# Patient Record
Sex: Male | Born: 1935 | Race: White | Hispanic: No | Marital: Married | Smoking: Never smoker
Health system: Southern US, Community
[De-identification: ages and names within clinical notes are randomized; demographics above are authoritative.]

## PROBLEM LIST (undated history)

## (undated) DIAGNOSIS — I219 Acute myocardial infarction, unspecified: Secondary | ICD-10-CM

## (undated) DIAGNOSIS — R7303 Prediabetes: Secondary | ICD-10-CM

## (undated) DIAGNOSIS — R972 Elevated prostate specific antigen [PSA]: Secondary | ICD-10-CM

## (undated) DIAGNOSIS — K222 Esophageal obstruction: Secondary | ICD-10-CM

## (undated) DIAGNOSIS — R112 Nausea with vomiting, unspecified: Secondary | ICD-10-CM

## (undated) DIAGNOSIS — I10 Essential (primary) hypertension: Secondary | ICD-10-CM

## (undated) DIAGNOSIS — K5790 Diverticulosis of intestine, part unspecified, without perforation or abscess without bleeding: Secondary | ICD-10-CM

## (undated) DIAGNOSIS — Z9889 Other specified postprocedural states: Secondary | ICD-10-CM

## (undated) DIAGNOSIS — E119 Type 2 diabetes mellitus without complications: Secondary | ICD-10-CM

## (undated) DIAGNOSIS — I4891 Unspecified atrial fibrillation: Secondary | ICD-10-CM

## (undated) DIAGNOSIS — M25519 Pain in unspecified shoulder: Secondary | ICD-10-CM

## (undated) DIAGNOSIS — Z9289 Personal history of other medical treatment: Secondary | ICD-10-CM

## (undated) DIAGNOSIS — L719 Rosacea, unspecified: Secondary | ICD-10-CM

## (undated) DIAGNOSIS — M1990 Unspecified osteoarthritis, unspecified site: Secondary | ICD-10-CM

## (undated) DIAGNOSIS — K219 Gastro-esophageal reflux disease without esophagitis: Secondary | ICD-10-CM

## (undated) DIAGNOSIS — S129XXA Fracture of neck, unspecified, initial encounter: Secondary | ICD-10-CM

## (undated) DIAGNOSIS — I2510 Atherosclerotic heart disease of native coronary artery without angina pectoris: Secondary | ICD-10-CM

## (undated) HISTORY — DX: Other specified postprocedural states: Z98.890

## (undated) HISTORY — PX: HERNIA REPAIR: SHX51

## (undated) HISTORY — DX: Atherosclerotic heart disease of native coronary artery without angina pectoris: I25.10

## (undated) HISTORY — PX: EYE SURGERY: SHX253

## (undated) HISTORY — DX: Personal history of other medical treatment: Z92.89

## (undated) HISTORY — DX: Diverticulosis of intestine, part unspecified, without perforation or abscess without bleeding: K57.90

## (undated) HISTORY — DX: Type 2 diabetes mellitus without complications: E11.9

## (undated) HISTORY — DX: Rosacea, unspecified: L71.9

## (undated) HISTORY — DX: Nausea with vomiting, unspecified: R11.2

## (undated) HISTORY — PX: TOTAL HIP ARTHROPLASTY: SHX124

## (undated) HISTORY — PX: CORNEAL TRANSPLANT: SHX108

## (undated) HISTORY — DX: Elevated prostate specific antigen (PSA): R97.20

## (undated) HISTORY — DX: Pain in unspecified shoulder: M25.519

---

## 1998-08-21 ENCOUNTER — Encounter: Payer: Self-pay | Admitting: Orthopedic Surgery

## 1998-08-25 ENCOUNTER — Inpatient Hospital Stay (HOSPITAL_COMMUNITY): Admission: RE | Admit: 1998-08-25 | Discharge: 1998-08-30 | Payer: Self-pay | Admitting: Orthopedic Surgery

## 1998-08-25 ENCOUNTER — Encounter: Payer: Self-pay | Admitting: Orthopedic Surgery

## 1998-08-28 ENCOUNTER — Encounter: Payer: Self-pay | Admitting: Orthopedic Surgery

## 1998-09-01 ENCOUNTER — Encounter (HOSPITAL_COMMUNITY): Admission: RE | Admit: 1998-09-01 | Discharge: 1998-11-30 | Payer: Self-pay | Admitting: Orthopedic Surgery

## 1999-09-21 ENCOUNTER — Encounter: Payer: Self-pay | Admitting: Internal Medicine

## 1999-09-21 ENCOUNTER — Encounter: Admission: RE | Admit: 1999-09-21 | Discharge: 1999-09-21 | Payer: Self-pay | Admitting: Internal Medicine

## 1999-09-28 ENCOUNTER — Encounter: Payer: Self-pay | Admitting: Gastroenterology

## 1999-09-28 ENCOUNTER — Ambulatory Visit (HOSPITAL_COMMUNITY): Admission: RE | Admit: 1999-09-28 | Discharge: 1999-09-28 | Payer: Self-pay | Admitting: Gastroenterology

## 2000-05-08 ENCOUNTER — Encounter: Payer: Self-pay | Admitting: Interventional Cardiology

## 2000-05-08 ENCOUNTER — Ambulatory Visit (HOSPITAL_COMMUNITY): Admission: RE | Admit: 2000-05-08 | Discharge: 2000-05-08 | Payer: Self-pay | Admitting: Interventional Cardiology

## 2004-10-09 ENCOUNTER — Ambulatory Visit (HOSPITAL_COMMUNITY): Admission: RE | Admit: 2004-10-09 | Discharge: 2004-10-09 | Payer: Self-pay | Admitting: Gastroenterology

## 2004-12-06 ENCOUNTER — Inpatient Hospital Stay (HOSPITAL_COMMUNITY): Admission: EM | Admit: 2004-12-06 | Discharge: 2004-12-07 | Payer: Self-pay | Admitting: Emergency Medicine

## 2004-12-07 ENCOUNTER — Ambulatory Visit: Payer: Self-pay | Admitting: Pulmonary Disease

## 2005-01-21 ENCOUNTER — Encounter (HOSPITAL_COMMUNITY): Admission: RE | Admit: 2005-01-21 | Discharge: 2005-04-21 | Payer: Self-pay | Admitting: Internal Medicine

## 2005-01-29 ENCOUNTER — Ambulatory Visit: Payer: Self-pay | Admitting: Pulmonary Disease

## 2005-02-12 ENCOUNTER — Encounter: Admission: RE | Admit: 2005-02-12 | Discharge: 2005-02-12 | Payer: Self-pay | Admitting: Internal Medicine

## 2005-02-20 ENCOUNTER — Encounter: Admission: RE | Admit: 2005-02-20 | Discharge: 2005-02-20 | Payer: Self-pay | Admitting: Internal Medicine

## 2005-02-20 ENCOUNTER — Encounter (INDEPENDENT_AMBULATORY_CARE_PROVIDER_SITE_OTHER): Payer: Self-pay | Admitting: Specialist

## 2005-02-20 ENCOUNTER — Other Ambulatory Visit: Admission: RE | Admit: 2005-02-20 | Discharge: 2005-02-20 | Payer: Self-pay | Admitting: Interventional Radiology

## 2005-04-19 ENCOUNTER — Ambulatory Visit (HOSPITAL_COMMUNITY): Admission: RE | Admit: 2005-04-19 | Discharge: 2005-04-19 | Payer: Self-pay | Admitting: Pulmonary Disease

## 2005-04-26 ENCOUNTER — Ambulatory Visit: Payer: Self-pay | Admitting: Pulmonary Disease

## 2005-09-02 ENCOUNTER — Encounter: Admission: RE | Admit: 2005-09-02 | Discharge: 2005-09-02 | Payer: Self-pay | Admitting: Internal Medicine

## 2008-10-21 ENCOUNTER — Encounter: Admission: RE | Admit: 2008-10-21 | Discharge: 2008-10-21 | Payer: Self-pay | Admitting: Internal Medicine

## 2010-11-04 ENCOUNTER — Encounter: Payer: Self-pay | Admitting: Internal Medicine

## 2010-11-07 ENCOUNTER — Ambulatory Visit (HOSPITAL_COMMUNITY)
Admission: RE | Admit: 2010-11-07 | Discharge: 2010-11-07 | Payer: Self-pay | Source: Home / Self Care | Attending: Gastroenterology | Admitting: Gastroenterology

## 2010-11-28 ENCOUNTER — Ambulatory Visit (HOSPITAL_COMMUNITY)
Admission: RE | Admit: 2010-11-28 | Discharge: 2010-11-28 | Disposition: A | Discharge status: Home / Self Care | Payer: Medicare Other | Source: Ambulatory Visit | Attending: Gastroenterology | Admitting: Gastroenterology

## 2010-11-28 DIAGNOSIS — R1310 Dysphagia, unspecified: Secondary | ICD-10-CM | POA: Insufficient documentation

## 2010-11-28 DIAGNOSIS — K222 Esophageal obstruction: Secondary | ICD-10-CM | POA: Insufficient documentation

## 2010-11-28 DIAGNOSIS — K449 Diaphragmatic hernia without obstruction or gangrene: Secondary | ICD-10-CM | POA: Insufficient documentation

## 2011-03-01 NOTE — H&P (Signed)
Jonathan [name] NO.:  [id number]   MEDICAL RECORD NO.:  [id number]          PATIENT TYPE:  INP   LOCATION:  1824                         FACILITY:  [facility]   PHYSICIAN:  Ulyses Amor, MD DATE OF BIRTH:  10-27-1930   DATE OF ADMISSION:  12/06/2004  DATE OF DISCHARGE:                                HISTORY & PHYSICAL   HISTORY OF PRESENT ILLNESS:  Jonathan [name] is a 77 year old white male who  is admitted to [facility] for further evaluation of chest pain and  palpitations.   The patient reportedly has a history of single vessel coronary artery  disease.  His records are on microfilm and thus not yet available at this  time.  However, he apparently underwent cardiac catheterization  approximately five years ago.  No intervention was performed.  His cardiac  history has been uncomplicated.  There is no history of chest pain,  myocardial infarction, congestive heart failure, or arrhythmia.   The patient experienced the onset of palpitations while he was in bed which  was described as intermittent of a rapid and irregular heartbeat.  This was  accompanied by an indigestion in his left upper anterior chest.  There was  no radiation of this chest discomfort.  There appeared to be no exacerbating  or ameliorating factors.  It was unrelated to position, activity, meals or  respiration.  There was no dyspnea, diaphoresis or nausea.  He did feel  fatigued.  This lasted for approximately an hour.  EMS was summoned, and he  was transported to the emergency department.  His chest discomfort had  resolved by the time that he had arrived from the emergency department.  The  total duration of chest discomfort was approximately 1-1/2 hours.  There was  no dizziness, lightheadedness, syncope or near syncope.  The patient feels  well at this time.   The patient has a number of risk factors for coronary artery disease  including hypertension and dyslipidemia.   There was no history of diabetes  mellitus, smoking or family history of early coronary artery disease.   In addition to the medical problems noted above, the patient had a history  of gastroesophageal reflux disease.  He was also just diagnosed with sleep  apnea.   CURRENT MEDICATIONS:  He is on a number of medications for the  aforementioned problems.  1.  Lipitor.  2.  Aspirin.  3.  Captopril.  4.  Aciphex.  5.  Hydrochlorothiazide.  6.  Nexium.  7.  Avodart.   ALLERGIES:  None.   SIGNIFICANT INJURIES:  None.   PREVIOUS OPERATIONS:  Two herniorrhaphies and bilateral hip replacement.   SOCIAL HISTORY:  The patient lives with his wife.  He is a retired Chartered certified accountant.  He neither smokes nor drinks.   FAMILY HISTORY:  His mother is alive and well at the age of 59.  His father  died of pancreatic cancer.  Family history is, otherwise, unremarkable.   REVIEW OF SYSTEMS:  No new problems related to his Head, Eyes, Ears, Nose,  Mouth, Lungs, Gastrointestinal System, Genitourinary  System or Extremities.  No history of neurological psychiatric disorder.  There is no history of  fever, chills or weight loss.   PHYSICAL EXAMINATION:  VITAL SIGNS:  Blood pressure 159/88, pulse 92 and  regular, respirations 18, temperature 100, pulse oximetry 95% on room air.  GENERAL APPEARANCE:  The patient was an elderly white male in no discomfort.  He was alert, oriented, appropriate and responsive.  HEENT:  Head, Eyes, Nose and Mouth were normal.  NECK:  Without thyromegaly or adenopathy.  Carotid pulses were palpable  bilaterally and without bruits.  CARDIAC:  Normal S1, S2.  No S3, S4, murmur, rub or click.  Cardiac rhythm  was regular.  No chest wall tenderness was noted.  LUNGS:  Clear.  ABDOMEN:  Soft and nontender.  There was no mass, hepatosplenomegaly, bruit,  distention, rebound, guarding or rigidity.  Bowel sounds were normal.  RECTAL:  Not performed because they were not pertinent  to the reason for  acute care hospitalization.  EXTREMITIES:  Without edema, deviation or deformity.   STUDIES:  Evidence of a prior inferior posterior myocardial infarction.  There were nonspecific ST changes in the anterolateral leads.  The chest  radiograph, according to the radiologist, demonstrated low lung volume,  cardiomegaly, vascular congestion, atelectasis at the left base, and a  possible vague density in the right upper lobe.  The initial set of cardiac  markers revealed a myoglobin of 91.0, CK MB 1.8 and troponin of less than  0.05.  White count was 11.4 with a hemoglobin of 14.1 and hematocrit of  41.3.  Fibrin derivatives were 1.26.  The remaining studies were pending at  the time of this dictation.   IMPRESSION:  1.  Chest pain:  Rule out angina, rule out myocardial infarction.  The      patient complained of a left anterior upper chest indigestion for 1-1/2      hours.  2.  History of single vessel coronary artery disease.  Details pending.  3.  Palpitations:  Rule out arrythmia.  4.  Hypertension.  5.  Dyslipidemia.  6.  Gastroesophageal reflux disease.  7.  Sleep apnea.  8.  Possible right upper lobe density.   PLAN:  1.  Telemetry.  2.  Serial cardiac enzymes.  3.  Aspirin.  4.  Intravenous nitroglycerin.  5.  Intravenous heparin.  6.  Obtain microfilm records.  7.  Chest CT (due to the elevated fibrin derivatives and the right upper      lobe densities).  8.  Further measures per Dr. Katrinka Blazing.      MSC/MEDQ  D:  12/06/2004  T:  12/06/2004  Job:  [id number]   cc:   Lesleigh Noe, M.D.  [address]  [address]  [place]  [place] [address]  Fax: [phone]

## 2011-03-01 NOTE — Discharge Summary (Signed)
NAMEMALICHI, PALARDY NO.:  [id number]   MEDICAL RECORD NO.:  [id number]          PATIENT TYPE:  INP   LOCATION:  5528                         FACILITY:  [facility]   PHYSICIAN:  Francisca December, M.D.  DATE OF BIRTH:  [birth year]   DATE OF ADMISSION:  12/06/2004  DATE OF DISCHARGE:  12/07/2004                                 DISCHARGE SUMMARY   CHIEF COMPLAINT:  Jonathan [name] is a 77 year old male with known history of  single vessel coronary artery disease. Medical treatment only due to disease  being in a branch not amenable to PCI. He presented to the emergency room  with complaints of palpitations, chest pain, indigestion, and chest  discomfort. No other associated symptoms. The duration of the pain was 1 to  1 and 1/2 hours. The patient has also received a new diagnosis of borderline  sleep apnea, to start nocturnal O2 after evaluated by home health. Physical  examination was essentially unremarkable initially. Blood pressure 159/88,  pulse 92, respiratory rate 18, temperature 100, pulse ox 95%. Examination  unremarkable. Electrocardiogram:  Nonspecific ST changes anterolateral leads  with prior inferior/posterior myocardial infarction. Chest x-ray showed  cardiomegaly with vascular congestion, atelectasis left base and a  questionable vague density in the right upper lobe. Initial cardiac markers  were negative. D-dimer was mildly elevated at 1.26. The patient was admitted  with the following diagnoses:   ADMISSION DIAGNOSES:  1.  Chest pain rule out  angina.  2.  Elevated D-dimer with chest pain rule out pulmonary embolism.  3.  Palpitations.  4.  Hypertension.  5.  Dyslipidemia.  6.  Gastroesophageal reflux disease.  7.  Borderline sleep apnea.  8.  Questionable right upper lobe density.   HOSPITAL COURSE:  1.  CHEST PAIN:  The patient was admitted to the general telemetry unit      where he was continued on aspirin, Lipitor, Captopril. Serial cardiac   isoenzymes were obtained. He was started on low-dose intravenous      nitroglycerin and heparin. Because of his palpitations, his TSH was      checked and this was within the normal range. While hospitalized, he did      have a 6 to 7 beat run of wide complex tachycardia, non-sustained      ventricular tachycardia versus some sort of aberrant rhythm. He has not      had this since admission. He underwent a stress Cardiolite on December 06, 2004, which did demonstrate non-specific horizontal ST segment      depression in V2 through V4 during the test and there was some      questionable ischemia on the Cardiolite study because of the patient's      accelerating angina symptoms and known coronary artery disease. Dr.      Amil [name] decided to proceed with coronary angiography. Left ventricular      function was found to be normal at about 60%. Left anterior descending      showed similar lesions in the past with a 90% proximal diagonal.  Circumflex had luminal irregularities as well as the right coronary      artery. Final impression after the catheterization was questionable      mixed angina and stable single vessel coronary artery disease. Because      of this, the patient was deemed appropriate for discharged home. No      additional cardiac workup needed at this time. To treat this mixed      angina issue, Dr. Amil [name] has added Norvasc and Imdur. We will      discontinue the patient's current home medications of      hydrochlorothiazide, but will continue the Captopril. Dr. Amil [name] is      also recommending not using beta blockers in the future due to increased      daytime somnolence related to borderline obstructive sleep apnea.  2.  ABNORMAL CT OF THE CHEST:  As mentioned previously, because of an      elevated D-dimer, Dr. Waldon [name] ordered a CT of the chest to rule out      pulmonary embolus. There was no pulmonary embolus found but there were      multiple bilateral pulmonary  nodules varying in size from about 3.5 to      1.5 cm focus, especially in the right upper lobe, inflammatory versus      neoplastic in nature. There was also a 2.1 cm left thyroid nodule noted      by the radiologist. Because of the abnormal pulmonary findings, a      consultation was obtained with the pulmonology group, Dr. Sung [name].      Please refer to their consultation note. Dr. Sung [name]' final thoughts in      the progress note as follows:      1.  Regarding the pulmonary nodule, the patient has never smoked. The          appearance on CT is consistent with post inflammatory granulomatous          changes. He doubts this is a malignancy. He has checked a PPD and          will need to be followed up with serial chest x-rays. He has asked          the patient's primary care physician to notify him if his PPD is          positive.      2.  In regards to the patient's daytime somnolence, the polysomnogram          showed borderline sleep apnea but he never achieved slow wave 3 or          REM sleep. This may be an under estimation of patient's underlying          disease. The patient and wife describe hyperkinesis during nocturnal          times as well as night terror. He has already seen Dr. Welton [name], a sleep          specialist, so no further recommendations from Dr. Sung [name] regarding          this issue at the time. Followup with Dr. Sung [name] has been          recommended as such. See him in 6 to 8 weeks. To get a chest x-ray          prior to coming in.  3.  CHEST PAIN:  With abnormal Cardiolite. Stable cardiac catheterization      with stable single  vessel disease. Presumed is mixed angina symptoms.  4.  HYPERTENSION:  Controlled.  5.  PULMONARY NODULES:  Probably inflammatory in nature. Followed by Dr.      Sung [name].  6.  LEFT THYROID NODULE:  2.1 cm, with normal TSH. I recommend patient      followup with primary care physician. 7.  BORDERLINE SLEEP APNEA:  Followed by Dr. Welton [name].  8.   DYSLIPIDEMIA.  9.  REFLUX.  10. PALPITATIONS:  PSVT per Dr. Amil [name] interpretation.   DISCHARGE MEDICATIONS:  1.  Norvasc 10 mg daily.  2.  Hydrochlorothiazide.  3.  Imdur 30 mg daily.  4.  Aciphex or Nexium at previous dose.  5.  Avodart at previous dose.  6.  Aspirin 325 mg daily.  7.  Ibuprofen three times a day for 3 to 5 days as needed for bursitis      flare.  8.  Lipitor 20 mg daily.  9.  Captopril 50 mg twice daily.   ACTIVITY:  As previous.   DIET:  Heart healthy.   FOLLOW UP:  1.  He is to see Dr. Sung [name] of pulmonary. Call [phone] to make an      appointment in early April. He has been instructed to tell the      schedulers that he needs a chest x-ray before coming in.  2.  He is to see Jonathan [name] on Monday, December 24, 2004 at 2:00 p.m.  3.  He is to come to Jonathan [name] office on Monday morning to have the      right arm checked where the TB/PPD      was injected.  4.  In addition, recommend patient make a followup appointment with Dr.      Earl [name] for further evaluation of the thyroid nodules see on CT of the      chest.      ALE/MEDQ  D:  12/07/2004  T:  12/09/2004  Job:  [id number]   cc:   Jonathan Amor, MD  [address]  [place], [place] [address]  Fax: [phone]   Jonathan [name], M.D.  [address]  [address]  [place]  [place] [address]  Fax: [phone]   Francisca December, M.D.  [address]  [address]  [place]  [place] [address]  Fax: [phone]   Oley Balm. Jonathan [name], M.D. [facility]

## 2011-03-01 NOTE — H&P (Signed)
[place]  Patient:    Jonathan [name], Jonathan [name]                    MRN: [id number] Adm. Date:  [id number] Attending:  Lyn Records. Iii Dictator:   Anselm Lis, N.P. CC:         Winn [place]ock. Earl Gala, M.D.                         History and Physical  DATE OF BIRTH:  1931/03/15  SUBJECTIVE:  Jonathan [name] is a very pleasant 77 year old with history of hypertriglyceridemia, hypotension and is overweight.  He has been complaining of post prandial (if he exerts himself) and sometimes exercise induced and emotional upset induced, anterior chest pressure/tightness which is constant in quality and relieved with rest and sometimes with belching.  He has a history of this over the last seven to eight years.  Seems exacerbated and increasing in frequently.  No associated shortness of breath, nausea, nor diaphoresis though he has had two recent episodes that were somewhat intense in discomfort.  Follow up Cardiolite on May 06, 2000 revealed exercise induced angina with follow up Cardiolite images demonstrating mild anterior and inferior basal ischemia.  Now presents for coronary angiography to define coronary anatomy and possible intervention if indicated and able.  Cardiac risk factors:  Age, male, hypertriglyceridemia (per patient), hypertension.  PAST MEDICAL HISTORY: 1. Hypertension for 30 years. 2. Gout (at the knees and feet). 3. (December 2000) Esophageal dilation of strictures by Dr. Sherin Quarry. 4. Elevated PSA with follow up prostate biopsy negative. (Dr. Wanda Plump). 5. Abnormal EKG revealing "old" inferior/posterior MI. 6. Corneal dystrophy bilaterally. 7. Hypertriglyceridemia.  PAST SURGICAL HISTORY:  Bilateral inguinal herniorrhaphies, prostate biopsy, left 5th finger surgery.  The patient denies history of cancer, asthma, diabetes mellitus, or thyroid disease.  ALLERGIES:  No known drug allergies.  No problems with seafood, shell fish  nor iodinated products.  MEDICATIONS:  Toprol XL 50 mg p.o. q.d.  Enteric coated aspirin 325 mg one p.o. q.d. (Toprol and aspirin initiated three days earlier).  Capoten 25 mg p.o. b.i.d.  Hydrochlorothiazide 25 mg p.o. q.d.  Protonix 40 mg p.o. q.d. Indocin p.r.n.  SOCIAL HISTORY AND HABITS:  The patient has been married for 44 years, has two sons ages 6 and 47 both with hypertension and one with obesity.  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  He travels frequently to [place] for mission work.  He plans to go May 29, 2000 if [facility] by cardiology.  ETOH, tobacco:  Negative.  FAMILY HISTORY:  The patient is an only child.  His father died of pancreatic cancer in his 75s.  His mother is age 68, alive and well and resides with patient and his wife.  REVIEW OF SYSTEMS:  As in HPI/past medical history.  Otherwise the patient denies problems with light headedness, dizziness, syncope, nor near syncopal episodes.  Does wear eye glasses.  No current dysphagia to food or fluid. Status post esophageal dilation. He denies constipation, diarrhea, melena nor bright red blood per rectum. Negative dysuria or hematuria. Gout symptoms as discussed above.  Denies orthopnea, PND or pedal edema.  No dyspnea on exertion.  PHYSICAL EXAMINATION:  VITAL SIGNS:  Blood pressure 169/86, heart rate 71 and regular, respiratory rate 18, temperature 97.8, height 5 feet 7 inches, weight 198 pounds.  GENERAL: He is a well-nourished, 66 year old in  no apparent distress.  Wife and friends are in attendance.  HEENT:  Brisk bilateral carotid upstroke.  Negative JVD.  He does have a right carotid bruit.  CHEST: Lung sounds clear with equal bilateral excursion. No CPA tenderness.  CARDIAC:  Regular rate and rhythm without murmur, rub or gallop.  Normal S1 and S1.  ABDOMEN: Soft, nondistended and normoactive bowel sounds.  Negative abdominal aortic, femoral or femoral  bruit.  Nontender to palpation. No masses. No organomegaly appreciated.  EXTREMITIES:  +2/4 bilateral radial, femoral, dorsalis pedis and posterior tibial pulses.  Negative pedal edema.  GENITORECTAL: Deferred.  LABORATORY TEST AND DATA: Cardiolite on May 06, 2000 revealed ejection fraction of 65%.  Mild anterior and inferior basal ischemia.  Exercise induced angina pectoris.  From today his CBC is 15.6 with WBC of 10.4 and platelets 243.  Prothrombin time 13.4 with INR of 1.1 and PTT of 30.  BMET is pending at the time of this dictation.  Chest x-ray revealed no active disease; question 4 mm left upper lobe granuloma.  Radiologist recommended a comparison with previous study.  EKG revealed "old" inferior/posterior MI.  NSR.  IMPRESSION: 1. Symptoms of angina pectoris in a 77 year old with history of    hypertriglyceridemia, hypertension.  Follow up stress Cardiolite    was suspicious for inferior basal ischemia with exercise induced    angina produced. 2. Hypertension.  Good control on current medical regimen. 3. History of gout.  Currently quiescent.  PLAN: 1. Coronary angiography with possible percutaneous intervention if    indicated and able.  Risks, potential complications, benefits and    alternatives to procedure discussed in detail.  The patient and his    wife indicate their questions and concerns have been addressed and    are agreeable to proceed. 2. Compare current chest x-ray film to prior studies for evidence of    prior 4 mm left upper lobe granuloma seen on todays study.DD:  05/08/00 TD:  05/08/00 Job: [id number]

## 2011-03-01 NOTE — Cardiovascular Report (Signed)
[facility]  Patient:    Jonathan [name], Jonathan [name]                    MRN: [id number] Proc. Date: 05/08/00 Adm. Date:  [id number] Attending:  Lyn Records. Iii CC:         James C. Earl Gala, M.D.             Cardiac Catheterization Lab                        Cardiac Catheterization  DATE OF BIRTH:  12/08/1930.  INDICATIONS:  Exertional angina and a mildly abnormal Cardiolite study demonstrating possible mild anterior ischemia and also a suggestion of inferior wall ischemia.  PROCEDURE PERFORMED: 1. Left heart catheterization. 2. Selective coronary angiography. 3. Left ventriculography. 4. Perclose arteriotomy closure following right femoral hand injected    angiography.  DESCRIPTION OF PROCEDURE:  After informed consent, a 6-French sheath was placed in the right femoral artery using a modified Seldinger technique.  A 6-French A2 Multipurpose catheter was used for hemodynamic recordings, left ventriculography, and selective left and right coronary angiography.  The patient tolerated the procedure without complications.  Following coronary angiography and left ventriculography, a ______  was performed in the right femoral artery and Perclose arteriotomy closure was performed without complications.  RESULTS:   I. Hemodynamic data      a. Aortic pressure 139/75.      b. Left ventricular pressure 139/15 mmHg.  II. Left ventriculography:  The left ventricle demonstrates normal      contractility by hand injection.  No mitral regurgitation is noted.  The      estimated ejection fraction is greater than 60%. III. Selective coronary angiography      a. Left main coronary:  Normal.      b. Left anterior descending coronary:  Large vessel that reaches the left         ventricular apex.  There is proximal luminal irregularities with some         ectasia, a large first septal perforator, and a moderate sized first         diagonal contains a 90-95% ostial  stenosis.  A second large diagonal         branch arises from the mid LAD.  Irregularities are noted in the LAD         beyond the second diagonal with up to 30% narrowing.      d. Circumflex artery:  The circumflex artery is large, giving origin to         three obtuse marginal branches.  The first obtuse marginal is large;         the second obtuse marginal is small.  The third obtuse marginal is         large.  No significant obstruction is noted in the circumflex system.      e. The right coronary:  This is a large vessel that gives origin to the         PDA and fills small inferior wall branches.  Irregularities are noted         in the proximal and mid vessel.  No significant obstruction is noted.  CONCLUSIONS: 1. Significant first diagonal stenosis with 95% ostial lesion accounting for    the patients anginal symptoms and some mild ischemia noted in the anterior    wall on the Cardiolite.  No evidence of significant  right coronary or    circumflex disease is noted to account for, what was felt to be, mild    ischemia inferiorly.  The proximal and mid and distal portions of the main    coronary arterial trees (right, circumflex, and LAD) are widely patent. 2. Normal left ventricular function.  PLAN:  Medical therapy with optimization of beta blocker therapy, sublingual nitroglycerin for prolonged episodes of pain, antiplatelet therapy, and aggressive antilipid therapy. DD:  05/08/00 TD:  05/08/00 Job: [id number]

## 2011-03-01 NOTE — Cardiovascular Report (Signed)
[name], [name]             ACCOUNT NO.:  [id number]   MEDICAL RECORD NO.:  [id number]          PATIENT TYPE:  INP   LOCATION:  5528                         FACILITY:  [facility]   PHYSICIAN:  Francisca December, M.D.  DATE OF BIRTH:  [birth year]   DATE OF PROCEDURE:  12/07/2004  DATE OF DISCHARGE:                              CARDIAC CATHETERIZATION   PROCEDURE PERFORMED:  1.  Left heart catheterization.  2.  Left ventriculogram.  3.  Distal aortogram.  4.  Coronary angiography.   INDICATION:  Mr. Jonathan [name] is a 77 year old man with known ASCVD,  single vessel.  Cardiac catheterization and coronary angiography in 2001  revealed a 90% stenosis at the origin of a first diagonal branch.  He has  had chronic angina post prandial since then.  Recently, has been more  progressive.  He had a prolonged episode at rest that awoke him from sleep  two nights ago.  A myocardial infarction has been ruled out.  He is brought  to the catheterization laboratory at this time to identify the extent of  disease and provide for further therapeutic options.   PROCEDURAL NOTE:  The patient was brought to the cardiac catheterization  laboratory in the fasting state.  The right groin was prepped and draped in  the usual sterile fashion.  Local anesthesia was obtained with the  infiltration of 1% lidocaine.  A 5 French catheter sheath was inserted  percutaneously into the right femoral artery utilizing an anterior approach  over a guiding J wire.  A 110-cm pigtail catheter was used to measure  pressures in the ascending aorta and in the left ventricle both prior to and  following the ventriculogram.  A 30-degree RAO cine left ventriculogram was  performed utilizing a power injector.  45 mL of contrast were injected at 13  mL per second.  The pigtail catheter was then withdrawn into the abdominal  aorta and positioned over the second lumbar vertebra.  An AP digital  cineangiogram of the distal aorta  was performed, again, using a power  injector.  36 mL were injected at 12 mL per second.  The pigtail catheter  was then exchanged for a 5 French #4 left Judkins catheter.  Following the  sublingual administration of 0.4 mg nitroglycerin, cineangiography of the  left coronary artery was conducted in multiple LAO and RAO projections.  The  5 [place] #4 left Judkins catheter was exchanged for a 5 [place] #4 right  Judkins catheter.  Cineangiography of a right coronary artery was conducted  in multiple LAO and RAO projections.  At the completion of the procedure,  the catheter and catheter sheaths were removed.  Hemostasis was achieved by  direct pressure.  The patient was transported to the recovery area in stable  condition with an intact distal pulse.   HEMODYNAMICS:  Systemic arterial pressure was 160/85 with a mean of 114  mmHg.  There was no systolic gradient across the aortic valve.  The left  ventricular end-diastolic pressure was 16 mmHg pre ventriculogram and  unchanged post ventriculogram.   ANGIOGRAPHY:  The left  ventriculogram demonstrated intact left ventricular  size and normal global systolic function.  There was focal anterolateral  hypokinesis.  A visual estimate of the ejection fraction is 60%.  There is 1-  2+ mitral regurgitation present.  The aortic valve is trileaflet and opens  normally during systole.  There is no coronary calcification.   The distal aortogram revealed widely patent right and left renal arteries  and no significant distal atherosclerotic disease.   There was a right dominant coronary system present.  The main left coronary  artery was normal.   The left anterior descending artery and its branches were highly diseased;  the vessel contains a 20-30% proximal focal eccentric narrowing just before  the origin of a moderate size first diagonal branch.  The diagonal branch  itself has a 90% starting from the ostium and extending about 1 cm into the   vessel.  The ongoing anterior descending artery has luminal irregularities  and gives rise to a large second diagonal branch.   Finally, the distal anterior descending artery has no obstructions and  reaches, but not completely traverse the apex.   A left circumflex coronary artery and its branches were minimally diseased.  There are luminal irregularities and perhaps a 30% stenosis in the proximal  portion at the origin of a moderate size first marginal branch.  There is a  very tiny second marginal and a small third marginal.  The fourth marginal  is on the obtuse margin and is moderate in size.  There are no significant  obstructions in the left circumflex coronary artery system or its marginal  branches.   The right coronary is diffusely diseased and has a 20-30% diffuse narrowing  in the proximal to mid portion.  There are luminal irregularities in the  remainder of the vessel.  It gives rise to a small posterior descending  artery and then a large posterior lateral segment.  Three left ventricular  branches arise from the posterior lateral segment.  The third and or last of  which is large in size.  There are no significant obstructions in the distal  branches of the right coronary or in the right coronary artery itself.   Collateral vessels are not seen.   FINAL IMPRESSION:  1.  Atherosclerotic coronary vascular disease, single vessel.  2.  No significant change in the degree of stenosis in this diagonal branch      lesion since 2001.  3.  Accelerating angina perhaps due to mixed angina (spasm) on fixed      disease.  4.  Intact left ventricular size and global systolic function with minimal      regional wall motion abnormality as noted.  Ejection fraction is 50%.  5.  Systemic hypertension without evidence of renal artery stenosis.  6.  1-2+ mitral regurgitation.   PLAN/RECOMMENDATION:  The patient will be initiated on calcium channel blocker amlodipine and a low dose  long-acting nitrate.  Beta blockers are  avoided due to the patient's excessive daytime sleepiness.  He recently  underwent a sleep study which showed very mild sleep apnea.  He is to be  initiated on nocturnal oxygen.  A followup appointment with Dr. Katrinka Blazing will  be arranged within the next 2-3 weeks.     JHE/MEDQ  D:  12/07/2004  T:  12/07/2004  Job:  [id number]   cc:   Theressa Millard, M.D.  [address] [place]  [place] [address]  Fax: [phone]

## 2011-10-03 DIAGNOSIS — Z947 Corneal transplant status: Secondary | ICD-10-CM | POA: Insufficient documentation

## 2011-10-03 DIAGNOSIS — H01009 Unspecified blepharitis unspecified eye, unspecified eyelid: Secondary | ICD-10-CM | POA: Insufficient documentation

## 2011-12-09 DIAGNOSIS — C44221 Squamous cell carcinoma of skin of unspecified ear and external auricular canal: Secondary | ICD-10-CM | POA: Diagnosis not present

## 2011-12-09 DIAGNOSIS — C44211 Basal cell carcinoma of skin of unspecified ear and external auricular canal: Secondary | ICD-10-CM | POA: Diagnosis not present

## 2011-12-09 DIAGNOSIS — C44320 Squamous cell carcinoma of skin of unspecified parts of face: Secondary | ICD-10-CM | POA: Diagnosis not present

## 2011-12-26 DIAGNOSIS — H04123 Dry eye syndrome of bilateral lacrimal glands: Secondary | ICD-10-CM | POA: Insufficient documentation

## 2011-12-26 DIAGNOSIS — H04129 Dry eye syndrome of unspecified lacrimal gland: Secondary | ICD-10-CM | POA: Diagnosis not present

## 2011-12-26 DIAGNOSIS — H2640 Unspecified secondary cataract: Secondary | ICD-10-CM | POA: Diagnosis not present

## 2011-12-26 DIAGNOSIS — Z947 Corneal transplant status: Secondary | ICD-10-CM | POA: Diagnosis not present

## 2012-01-02 DIAGNOSIS — C44211 Basal cell carcinoma of skin of unspecified ear and external auricular canal: Secondary | ICD-10-CM | POA: Diagnosis not present

## 2012-03-16 ENCOUNTER — Other Ambulatory Visit: Payer: Self-pay | Admitting: Dermatology

## 2012-03-16 DIAGNOSIS — C44319 Basal cell carcinoma of skin of other parts of face: Secondary | ICD-10-CM | POA: Diagnosis not present

## 2012-03-16 DIAGNOSIS — L821 Other seborrheic keratosis: Secondary | ICD-10-CM | POA: Diagnosis not present

## 2012-03-16 DIAGNOSIS — L570 Actinic keratosis: Secondary | ICD-10-CM | POA: Diagnosis not present

## 2012-03-19 DIAGNOSIS — H2640 Unspecified secondary cataract: Secondary | ICD-10-CM | POA: Diagnosis not present

## 2012-03-19 DIAGNOSIS — Z760 Encounter for issue of repeat prescription: Secondary | ICD-10-CM | POA: Diagnosis not present

## 2012-03-19 DIAGNOSIS — H04129 Dry eye syndrome of unspecified lacrimal gland: Secondary | ICD-10-CM | POA: Diagnosis not present

## 2012-03-19 DIAGNOSIS — Z947 Corneal transplant status: Secondary | ICD-10-CM | POA: Diagnosis not present

## 2012-03-19 DIAGNOSIS — H01009 Unspecified blepharitis unspecified eye, unspecified eyelid: Secondary | ICD-10-CM | POA: Diagnosis not present

## 2012-03-23 DIAGNOSIS — E119 Type 2 diabetes mellitus without complications: Secondary | ICD-10-CM | POA: Diagnosis not present

## 2012-03-23 DIAGNOSIS — B351 Tinea unguium: Secondary | ICD-10-CM | POA: Diagnosis not present

## 2012-03-23 DIAGNOSIS — H9190 Unspecified hearing loss, unspecified ear: Secondary | ICD-10-CM | POA: Diagnosis not present

## 2012-03-23 DIAGNOSIS — I10 Essential (primary) hypertension: Secondary | ICD-10-CM | POA: Diagnosis not present

## 2012-03-23 DIAGNOSIS — R1310 Dysphagia, unspecified: Secondary | ICD-10-CM | POA: Diagnosis not present

## 2012-03-24 ENCOUNTER — Other Ambulatory Visit: Payer: Self-pay | Admitting: Gastroenterology

## 2012-03-24 DIAGNOSIS — R1310 Dysphagia, unspecified: Secondary | ICD-10-CM

## 2012-03-30 ENCOUNTER — Ambulatory Visit
Admission: RE | Admit: 2012-03-30 | Discharge: 2012-03-30 | Disposition: A | Discharge status: Home / Self Care | Payer: Medicare Other | Source: Ambulatory Visit | Attending: Gastroenterology | Admitting: Gastroenterology

## 2012-03-30 DIAGNOSIS — K219 Gastro-esophageal reflux disease without esophagitis: Secondary | ICD-10-CM | POA: Diagnosis not present

## 2012-03-30 DIAGNOSIS — K222 Esophageal obstruction: Secondary | ICD-10-CM | POA: Diagnosis not present

## 2012-03-30 DIAGNOSIS — R1310 Dysphagia, unspecified: Secondary | ICD-10-CM

## 2012-03-30 DIAGNOSIS — L600 Ingrowing nail: Secondary | ICD-10-CM | POA: Diagnosis not present

## 2012-03-30 DIAGNOSIS — B351 Tinea unguium: Secondary | ICD-10-CM | POA: Diagnosis not present

## 2012-04-02 ENCOUNTER — Encounter (HOSPITAL_COMMUNITY): Payer: Self-pay

## 2012-04-02 ENCOUNTER — Ambulatory Visit (HOSPITAL_COMMUNITY)
Admission: RE | Admit: 2012-04-02 | Discharge: 2012-04-02 | Disposition: A | Discharge status: Home / Self Care | Payer: Medicare Other | Source: Ambulatory Visit | Attending: Gastroenterology | Admitting: Gastroenterology

## 2012-04-02 ENCOUNTER — Encounter (HOSPITAL_COMMUNITY)
Admission: RE | Disposition: A | Discharge status: Home / Self Care | Payer: Self-pay | Source: Ambulatory Visit | Attending: Gastroenterology

## 2012-04-02 DIAGNOSIS — K449 Diaphragmatic hernia without obstruction or gangrene: Secondary | ICD-10-CM | POA: Insufficient documentation

## 2012-04-02 DIAGNOSIS — K222 Esophageal obstruction: Secondary | ICD-10-CM | POA: Diagnosis not present

## 2012-04-02 DIAGNOSIS — R933 Abnormal findings on diagnostic imaging of other parts of digestive tract: Secondary | ICD-10-CM | POA: Diagnosis not present

## 2012-04-02 DIAGNOSIS — K224 Dyskinesia of esophagus: Secondary | ICD-10-CM | POA: Insufficient documentation

## 2012-04-02 DIAGNOSIS — K219 Gastro-esophageal reflux disease without esophagitis: Secondary | ICD-10-CM | POA: Insufficient documentation

## 2012-04-02 DIAGNOSIS — R1310 Dysphagia, unspecified: Secondary | ICD-10-CM | POA: Diagnosis not present

## 2012-04-02 HISTORY — DX: Essential (primary) hypertension: I10

## 2012-04-02 HISTORY — PX: BALLOON DILATION: SHX5330

## 2012-04-02 HISTORY — PX: ESOPHAGOGASTRODUODENOSCOPY: SHX5428

## 2012-04-02 SURGERY — EGD (ESOPHAGOGASTRODUODENOSCOPY)
Anesthesia: Moderate Sedation

## 2012-04-02 MED ORDER — FENTANYL CITRATE 0.05 MG/ML IJ SOLN
INTRAMUSCULAR | Status: AC
  Filled 2012-04-02: qty 2

## 2012-04-02 MED ORDER — MIDAZOLAM HCL 10 MG/2ML IJ SOLN
INTRAMUSCULAR | Status: AC
  Filled 2012-04-02: qty 2

## 2012-04-02 MED ORDER — ASPIRIN 81 MG PO TABS: 81.0000 mg | ORAL_TABLET | Freq: Every day | ORAL | Status: DC

## 2012-04-02 MED ORDER — SODIUM CHLORIDE 0.9 % IV SOLN
Freq: Once | INTRAVENOUS | Status: AC
  Administered 2012-04-02: 500 mL via INTRAVENOUS

## 2012-04-02 MED ORDER — BUTAMBEN-TETRACAINE-BENZOCAINE 2-2-14 % EX AERO
INHALATION_SPRAY | CUTANEOUS | Status: DC | PRN
  Administered 2012-04-02: 2 via TOPICAL

## 2012-04-02 MED ORDER — FENTANYL NICU IV SYRINGE 50 MCG/ML
INJECTION | INTRAMUSCULAR | Status: DC | PRN
  Administered 2012-04-02 (×2): 25 ug via INTRAVENOUS

## 2012-04-02 MED ORDER — MIDAZOLAM HCL 10 MG/2ML IJ SOLN
INTRAMUSCULAR | Status: DC | PRN
  Administered 2012-04-02 (×2): 2 mg via INTRAVENOUS

## 2012-04-02 NOTE — Discharge Instructions (Addendum)
Endoscopy Care After Please read the instructions outlined below and refer to this sheet in the next few weeks. These discharge instructions provide you with general information on caring for yourself after you leave the hospital. Your doctor may also give you specific instructions. While your treatment has been planned according to the most current medical practices available, unavoidable complications occasionally occur. If you have any problems or questions after discharge, please call Dr. Dulce Sellar [facility]) at [phone].  HOME CARE INSTRUCTIONS Activity  You may resume your regular activity but move at a slower pace for the next 24 hours.   Take frequent rest periods for the next 24 hours.   Walking will help expel (get rid of) the air and reduce the bloated feeling in your abdomen.   No driving for 24 hours (because of the anesthesia (medicine) used during the test).   You may shower.   Do not sign any important legal documents or operate any machinery for 24 hours (because of the anesthesia used during the test).  Nutrition  Drink plenty of fluids.   Soft diet today.  Tomorrow, you may resume your normal diet.   Begin with a light meal and progress to your normal diet.   Avoid alcoholic beverages for 24 hours or as instructed by your caregiver.  Medications Hold aspirin for three days.   Otherwise, you may resume your normal medications. What you can expect today  You may experience abdominal discomfort such as a feeling of fullness or "gas" pains.   You may experience a sore throat for 2 to 3 days. This is normal. Gargling with salt water may help this.    SEEK IMMEDIATE MEDICAL CARE IF:  You have excessive nausea (feeling sick to your stomach) and/or vomiting.   You have severe abdominal pain and distention (swelling).   You have trouble swallowing.   You have a temperature over 100 F (37.8 C).   You have rectal bleeding or vomiting of blood.   Document Released: 05/14/2004 Document Revised: 06/12/2011 Document Reviewed: 11/25/2007 [facility] Patient Information 2012 [place], [place].  Endoscopy Care After Please read the instructions outlined below and refer to this sheet in the next few weeks. These discharge instructions provide you with general information on caring for yourself after you leave the hospital. Your doctor may also give you specific instructions. While your treatment has been planned according to the most current medical practices available, unavoidable complications occasionally occur. If you have any problems or questions after discharge, please call your doctor. HOME CARE INSTRUCTIONS Activity  You may resume your regular activity but move at a slower pace for the next 24 hours.   Take frequent rest periods for the next 24 hours.   Walking will help expel (get rid of) the air and reduce the bloated feeling in your abdomen.   No driving for 24 hours (because of the anesthesia (medicine) used during the test).   You may shower.   Do not sign any important legal documents or operate any machinery for 24 hours (because of the anesthesia used during the test).  Nutrition  Drink plenty of fluids.   You may resume your normal diet.   Begin with a light meal and progress to your normal diet.   Avoid alcoholic beverages for 24 hours or as instructed by your caregiver.  Medications You may resume your normal medications unless your caregiver tells you otherwise. What you can expect today  You may experience abdominal discomfort such as a  feeling of fullness or "gas" pains.   You may experience a sore throat for 2 to 3 days. This is normal. Gargling with salt water may help this.  Follow-up Your doctor will discuss the results of your test with you. SEEK IMMEDIATE MEDICAL CARE IF:  You have excessive nausea (feeling sick to your stomach) and/or vomiting.   You have severe abdominal pain and distention  (swelling).   You have trouble swallowing.   You have a temperature over 100 F (37.8 C).   You have rectal bleeding or vomiting of blood.  Document Released: 05/14/2004 Document Revised: 09/19/2011 Document Reviewed: 11/25/2007 [facility] Patient Information 2012 [place], [place].

## 2012-04-02 NOTE — H&P (Signed)
Patient interval history reviewed.  Patient examined again.  There has been no change from documented H/P dated 03/30/2012 (scanned into chart from our office) except as documented above.  Assessment:  1.  Dysphagia. 2.  Abnormal Barium Swallowing (distal esophageal narrowing). 3.  History of Schatzki's ring and hiatal hernia.  Plan:  1.  Endoscopy with possible biopsy, possible esophageal dilatation. 2.  Risks (bleeding, infection, bowel perforation that could require surgery, sedation-related changes in cardiopulmonary systems), benefits (identification and possible treatment of source of symptoms, exclusion of certain causes of symptoms), and alternatives (watchful waiting, radiographic imaging studies, empiric medical treatment) of upper endoscopy with possible esophageal dilatation (EGD +/- DIL) were explained to patient in detail and he wishes to proceed.

## 2012-04-02 NOTE — Op Note (Signed)
[facility] [address] [place], [place]  [address]  ENDOSCOPY PROCEDURE REPORT  PATIENT:  Jonathan, [name]  MR#:  [id number] BIRTHDATE:  Jun 14, 1931, 80 yrs. old  GENDER:  male  ENDOSCOPIST:  Willis Modena, MD Referred by:  Theressa Millard, M.D.  PROCEDURE DATE:  04/02/2012 PROCEDURE:  EGD with balloon dilatation ASA CLASS:  Class III INDICATIONS:  dysphagia, abnormal barium swallow  MEDICATIONS:   Cetacaine spray x 2, Fentanyl 50 mcg IV, Versed 4 mg IV  DESCRIPTION OF PROCEDURE:   After the risks benefits and alternatives of the procedure were thoroughly explained, informed consent was obtained.  The EG-[id number] ([id number]) endoscope was introduced through the mouth and advanced to the second portion of the duodenum, without limitations.  The instrument was slowly withdrawn as the mucosa was fully examined.  <<PROCEDUREIMAGES>>  FINDINGS:  Tortuous esophagus diffusely.  No esophagitis seen. Medium-sized hiatal hernia. Schatzki's ring again appreciated, estimated luminal diameter   15mm.  Ring serially dilated to 18 mm (16.96mm, mild resistance, 60 seconds; 18mm, moderate resistance, 60 seconds) with TTS balloon dilating catheter.  There was mild mucosal disruption and bloody show post dilatation.  Stomach, pylorus, and duodenum to the second portion was otherwise normal.  ENDOSCOPIC IMPRESSION:    1.  As above.  Schatzki's ring dilated to 18mm 2.  Suspect dysphagia is multifactorial (ring, GERD, dysmotility, tortuous esophagus).  RECOMMENDATIONS:      1.  Watch for potential complications of procedure. 2.  Continue Prilosec OTC 20 mg once-a-day indefinitely. 3.  Follow clinical response to dilatation. 4.  Follow-up in office in 6-8 weeks.  REPEAT EXAM:  No  ______________________________ Willis Modena  CC:  n. eSIGNEDWillis Modena at 04/02/2012 09:22 AM  Rachel Moulds, [phone]

## 2012-04-03 ENCOUNTER — Encounter (HOSPITAL_COMMUNITY): Payer: Self-pay | Admitting: Gastroenterology

## 2012-04-08 DIAGNOSIS — C44319 Basal cell carcinoma of skin of other parts of face: Secondary | ICD-10-CM | POA: Diagnosis not present

## 2012-04-17 DIAGNOSIS — H903 Sensorineural hearing loss, bilateral: Secondary | ICD-10-CM | POA: Diagnosis not present

## 2012-05-11 DIAGNOSIS — M545 Low back pain: Secondary | ICD-10-CM | POA: Diagnosis not present

## 2012-05-19 ENCOUNTER — Ambulatory Visit: Payer: Medicare Other | Admitting: Physical Therapy

## 2012-05-19 ENCOUNTER — Ambulatory Visit: Payer: Medicare Other | Attending: Orthopedic Surgery | Admitting: Physical Therapy

## 2012-05-19 DIAGNOSIS — M2569 Stiffness of other specified joint, not elsewhere classified: Secondary | ICD-10-CM | POA: Diagnosis not present

## 2012-05-19 DIAGNOSIS — M545 Low back pain, unspecified: Secondary | ICD-10-CM | POA: Diagnosis not present

## 2012-05-19 DIAGNOSIS — M25559 Pain in unspecified hip: Secondary | ICD-10-CM | POA: Insufficient documentation

## 2012-05-19 DIAGNOSIS — IMO0001 Reserved for inherently not codable concepts without codable children: Secondary | ICD-10-CM | POA: Diagnosis not present

## 2012-05-25 ENCOUNTER — Ambulatory Visit: Payer: Medicare Other | Admitting: Physical Therapy

## 2012-05-26 ENCOUNTER — Ambulatory Visit: Payer: Medicare Other | Admitting: Physical Therapy

## 2012-06-02 ENCOUNTER — Ambulatory Visit: Payer: Medicare Other | Admitting: Physical Therapy

## 2012-06-08 ENCOUNTER — Ambulatory Visit: Payer: Medicare Other | Admitting: Physical Therapy

## 2012-06-08 DIAGNOSIS — N500 Atrophy of testis: Secondary | ICD-10-CM | POA: Diagnosis not present

## 2012-06-08 DIAGNOSIS — N529 Male erectile dysfunction, unspecified: Secondary | ICD-10-CM | POA: Diagnosis not present

## 2012-06-08 DIAGNOSIS — N400 Enlarged prostate without lower urinary tract symptoms: Secondary | ICD-10-CM | POA: Diagnosis not present

## 2012-06-08 DIAGNOSIS — R972 Elevated prostate specific antigen [PSA]: Secondary | ICD-10-CM | POA: Diagnosis not present

## 2012-06-09 DIAGNOSIS — M545 Low back pain: Secondary | ICD-10-CM | POA: Diagnosis not present

## 2012-06-16 DIAGNOSIS — R1310 Dysphagia, unspecified: Secondary | ICD-10-CM | POA: Diagnosis not present

## 2012-06-30 DIAGNOSIS — T1510XA Foreign body in conjunctival sac, unspecified eye, initial encounter: Secondary | ICD-10-CM | POA: Diagnosis not present

## 2012-06-30 DIAGNOSIS — T1580XA Foreign body in other and multiple parts of external eye, unspecified eye, initial encounter: Secondary | ICD-10-CM | POA: Diagnosis not present

## 2012-07-06 DIAGNOSIS — M79609 Pain in unspecified limb: Secondary | ICD-10-CM | POA: Diagnosis not present

## 2012-07-06 DIAGNOSIS — B351 Tinea unguium: Secondary | ICD-10-CM | POA: Diagnosis not present

## 2012-07-16 ENCOUNTER — Other Ambulatory Visit: Payer: Self-pay | Admitting: Dermatology

## 2012-07-16 DIAGNOSIS — L570 Actinic keratosis: Secondary | ICD-10-CM | POA: Diagnosis not present

## 2012-07-16 DIAGNOSIS — C44211 Basal cell carcinoma of skin of unspecified ear and external auricular canal: Secondary | ICD-10-CM | POA: Diagnosis not present

## 2012-07-16 DIAGNOSIS — Z85828 Personal history of other malignant neoplasm of skin: Secondary | ICD-10-CM | POA: Diagnosis not present

## 2012-07-16 DIAGNOSIS — L719 Rosacea, unspecified: Secondary | ICD-10-CM | POA: Diagnosis not present

## 2012-09-08 DIAGNOSIS — I10 Essential (primary) hypertension: Secondary | ICD-10-CM | POA: Diagnosis not present

## 2012-09-08 DIAGNOSIS — E119 Type 2 diabetes mellitus without complications: Secondary | ICD-10-CM | POA: Diagnosis not present

## 2012-09-08 DIAGNOSIS — I209 Angina pectoris, unspecified: Secondary | ICD-10-CM | POA: Diagnosis not present

## 2012-09-08 DIAGNOSIS — Z0000 Encounter for general adult medical examination without abnormal findings: Secondary | ICD-10-CM | POA: Diagnosis not present

## 2012-09-08 DIAGNOSIS — E669 Obesity, unspecified: Secondary | ICD-10-CM | POA: Diagnosis not present

## 2012-09-08 DIAGNOSIS — Z1331 Encounter for screening for depression: Secondary | ICD-10-CM | POA: Diagnosis not present

## 2012-09-08 DIAGNOSIS — M109 Gout, unspecified: Secondary | ICD-10-CM | POA: Diagnosis not present

## 2012-09-23 DIAGNOSIS — H02839 Dermatochalasis of unspecified eye, unspecified eyelid: Secondary | ICD-10-CM | POA: Insufficient documentation

## 2012-09-23 DIAGNOSIS — H01009 Unspecified blepharitis unspecified eye, unspecified eyelid: Secondary | ICD-10-CM | POA: Diagnosis not present

## 2012-09-23 DIAGNOSIS — E119 Type 2 diabetes mellitus without complications: Secondary | ICD-10-CM | POA: Diagnosis not present

## 2012-09-23 DIAGNOSIS — H04129 Dry eye syndrome of unspecified lacrimal gland: Secondary | ICD-10-CM | POA: Diagnosis not present

## 2012-09-23 DIAGNOSIS — Z947 Corneal transplant status: Secondary | ICD-10-CM | POA: Diagnosis not present

## 2012-09-28 DIAGNOSIS — M79609 Pain in unspecified limb: Secondary | ICD-10-CM | POA: Diagnosis not present

## 2012-09-28 DIAGNOSIS — B351 Tinea unguium: Secondary | ICD-10-CM | POA: Diagnosis not present

## 2013-01-04 DIAGNOSIS — M79609 Pain in unspecified limb: Secondary | ICD-10-CM | POA: Diagnosis not present

## 2013-01-04 DIAGNOSIS — B351 Tinea unguium: Secondary | ICD-10-CM | POA: Diagnosis not present

## 2013-03-15 ENCOUNTER — Other Ambulatory Visit: Payer: Self-pay | Admitting: Dermatology

## 2013-03-15 DIAGNOSIS — Z85828 Personal history of other malignant neoplasm of skin: Secondary | ICD-10-CM | POA: Diagnosis not present

## 2013-03-15 DIAGNOSIS — C44519 Basal cell carcinoma of skin of other part of trunk: Secondary | ICD-10-CM | POA: Diagnosis not present

## 2013-03-15 DIAGNOSIS — C44319 Basal cell carcinoma of skin of other parts of face: Secondary | ICD-10-CM | POA: Diagnosis not present

## 2013-03-15 DIAGNOSIS — D0420 Carcinoma in situ of skin of unspecified ear and external auricular canal: Secondary | ICD-10-CM | POA: Diagnosis not present

## 2013-03-15 DIAGNOSIS — D485 Neoplasm of uncertain behavior of skin: Secondary | ICD-10-CM | POA: Diagnosis not present

## 2013-03-15 DIAGNOSIS — L570 Actinic keratosis: Secondary | ICD-10-CM | POA: Diagnosis not present

## 2013-03-16 DIAGNOSIS — I10 Essential (primary) hypertension: Secondary | ICD-10-CM | POA: Diagnosis not present

## 2013-03-16 DIAGNOSIS — I209 Angina pectoris, unspecified: Secondary | ICD-10-CM | POA: Diagnosis not present

## 2013-03-16 DIAGNOSIS — R5381 Other malaise: Secondary | ICD-10-CM | POA: Diagnosis not present

## 2013-03-16 DIAGNOSIS — E119 Type 2 diabetes mellitus without complications: Secondary | ICD-10-CM | POA: Diagnosis not present

## 2013-04-05 DIAGNOSIS — M79609 Pain in unspecified limb: Secondary | ICD-10-CM | POA: Diagnosis not present

## 2013-04-05 DIAGNOSIS — B351 Tinea unguium: Secondary | ICD-10-CM | POA: Diagnosis not present

## 2013-04-06 DIAGNOSIS — C44319 Basal cell carcinoma of skin of other parts of face: Secondary | ICD-10-CM | POA: Diagnosis not present

## 2013-04-06 DIAGNOSIS — Z85828 Personal history of other malignant neoplasm of skin: Secondary | ICD-10-CM | POA: Diagnosis not present

## 2013-04-23 DIAGNOSIS — Z947 Corneal transplant status: Secondary | ICD-10-CM | POA: Diagnosis not present

## 2013-04-23 DIAGNOSIS — E119 Type 2 diabetes mellitus without complications: Secondary | ICD-10-CM | POA: Diagnosis not present

## 2013-04-23 DIAGNOSIS — H04129 Dry eye syndrome of unspecified lacrimal gland: Secondary | ICD-10-CM | POA: Diagnosis not present

## 2013-04-23 DIAGNOSIS — H2640 Unspecified secondary cataract: Secondary | ICD-10-CM | POA: Diagnosis not present

## 2013-04-23 DIAGNOSIS — H02839 Dermatochalasis of unspecified eye, unspecified eyelid: Secondary | ICD-10-CM | POA: Diagnosis not present

## 2013-05-12 DIAGNOSIS — H02839 Dermatochalasis of unspecified eye, unspecified eyelid: Secondary | ICD-10-CM | POA: Diagnosis not present

## 2013-05-12 DIAGNOSIS — H52229 Regular astigmatism, unspecified eye: Secondary | ICD-10-CM | POA: Diagnosis not present

## 2013-05-12 DIAGNOSIS — Z961 Presence of intraocular lens: Secondary | ICD-10-CM | POA: Diagnosis not present

## 2013-05-12 DIAGNOSIS — H524 Presbyopia: Secondary | ICD-10-CM | POA: Diagnosis not present

## 2013-05-20 ENCOUNTER — Other Ambulatory Visit: Payer: Self-pay | Admitting: Dermatology

## 2013-05-20 DIAGNOSIS — L819 Disorder of pigmentation, unspecified: Secondary | ICD-10-CM | POA: Diagnosis not present

## 2013-05-20 DIAGNOSIS — C44611 Basal cell carcinoma of skin of unspecified upper limb, including shoulder: Secondary | ICD-10-CM | POA: Diagnosis not present

## 2013-05-20 DIAGNOSIS — C44319 Basal cell carcinoma of skin of other parts of face: Secondary | ICD-10-CM | POA: Diagnosis not present

## 2013-05-28 ENCOUNTER — Encounter (HOSPITAL_COMMUNITY): Admission: EM | Disposition: A | Discharge status: Home / Self Care | Payer: Self-pay | Source: Home / Self Care

## 2013-05-28 ENCOUNTER — Emergency Department (HOSPITAL_COMMUNITY)
Admission: EM | Admit: 2013-05-28 | Discharge: 2013-05-28 | Disposition: A | Discharge status: Home / Self Care | Payer: Medicare Other | Attending: Emergency Medicine | Admitting: Emergency Medicine

## 2013-05-28 ENCOUNTER — Encounter (HOSPITAL_COMMUNITY): Payer: Self-pay | Admitting: Emergency Medicine

## 2013-05-28 DIAGNOSIS — IMO0002 Reserved for concepts with insufficient information to code with codable children: Secondary | ICD-10-CM | POA: Insufficient documentation

## 2013-05-28 DIAGNOSIS — Z79899 Other long term (current) drug therapy: Secondary | ICD-10-CM | POA: Insufficient documentation

## 2013-05-28 DIAGNOSIS — Z8719 Personal history of other diseases of the digestive system: Secondary | ICD-10-CM | POA: Diagnosis not present

## 2013-05-28 DIAGNOSIS — Y929 Unspecified place or not applicable: Secondary | ICD-10-CM | POA: Insufficient documentation

## 2013-05-28 DIAGNOSIS — I10 Essential (primary) hypertension: Secondary | ICD-10-CM | POA: Diagnosis not present

## 2013-05-28 DIAGNOSIS — T18108A Unspecified foreign body in esophagus causing other injury, initial encounter: Secondary | ICD-10-CM | POA: Insufficient documentation

## 2013-05-28 DIAGNOSIS — Y939 Activity, unspecified: Secondary | ICD-10-CM | POA: Insufficient documentation

## 2013-05-28 HISTORY — PX: ESOPHAGOGASTRODUODENOSCOPY: SHX5428

## 2013-05-28 SURGERY — EGD (ESOPHAGOGASTRODUODENOSCOPY)
Anesthesia: Moderate Sedation

## 2013-05-28 MED ORDER — BUTAMBEN-TETRACAINE-BENZOCAINE 2-2-14 % EX AERO
INHALATION_SPRAY | CUTANEOUS | Status: DC | PRN
  Administered 2013-05-28: 2 via TOPICAL

## 2013-05-28 MED ORDER — GLUCAGON HCL (RDNA) 1 MG IJ SOLR
1.0000 mg | Freq: Once | INTRAMUSCULAR | Status: AC
  Administered 2013-05-28: 1 mg via INTRAVENOUS
  Filled 2013-05-28: qty 1

## 2013-05-28 MED ORDER — SODIUM CHLORIDE 0.9 % IV SOLN
Freq: Once | INTRAVENOUS | Status: AC
  Administered 2013-05-28: 04:00:00 via INTRAVENOUS

## 2013-05-28 MED ORDER — MIDAZOLAM HCL 10 MG/2ML IJ SOLN
INTRAMUSCULAR | Status: DC | PRN
  Administered 2013-05-28 (×2): 2 mg via INTRAVENOUS

## 2013-05-28 MED ORDER — FENTANYL CITRATE 0.05 MG/ML IJ SOLN
INTRAMUSCULAR | Status: DC | PRN
  Administered 2013-05-28 (×2): 25 ug via INTRAVENOUS

## 2013-05-28 NOTE — ED Provider Notes (Signed)
CSN: [id number]     Arrival date & time 05/28/13  7829 History     First MD Initiated Contact with Patient 05/28/13 [phone]     Chief Complaint  Patient presents with  . Foreign body in throat    (Consider location/radiation/quality/duration/timing/severity/associated sxs/prior Treatment) HPI This is a 79 year old male with a history of esophageal stricture and esophageal foreign body. He is here with a piece of chicken stuck in his upper esophagus since about 8 PM yesterday evening. He has been able to swallow anything, even his own saliva. He has tried to induce vomiting without success. He is trying to drink fluids without success. There is mild to moderate discomfort in his upper esophageal area that comes and goes. He denies nausea. He denies breathing difficulty. He has a history of esophageal dilation. He recently had multiple skin cancer biopsies of his face as well as cryo-destruction.   Past Medical History  Diagnosis Date  . Hypertension    Past Surgical History  Procedure Laterality Date  . Total hip arthroplasty      bilateral  . Corneal transplant    . Esophagogastroduodenoscopy  04/02/2012    Procedure: ESOPHAGOGASTRODUODENOSCOPY (EGD);  Surgeon: Willis Modena, MD;  Location: [facility];  Service: Endoscopy;  Laterality: N/A;  . Balloon dilation  04/02/2012    Procedure: BALLOON DILATION;  Surgeon: Willis Modena, MD;  Location: [facility] ENDOSCOPY;  Service: Endoscopy;  Laterality: N/A;  . Hernia repair     History reviewed. No pertinent family history. History  Substance Use Topics  . Smoking status: Never Smoker   . Smokeless tobacco: Never Used  . Alcohol Use: No    Review of Systems  All other systems reviewed and are negative.    Allergies  Review of patient's allergies indicates no known allergies.  Home Medications   Current Outpatient Rx  Name  Route  Sig  Dispense  Refill  . amLODipine-benazepril (LOTREL) 10-20 MG per capsule   Oral   Take 1 capsule  by mouth daily.         Marland Kitchen aspirin 81 MG tablet   Oral   Take 1 tablet (81 mg total) by mouth daily.   30 tablet      . captopril (CAPOTEN) 50 MG tablet   Oral   Take 50 mg by mouth 3 (three) times daily.         . finasteride (PROSCAR) 5 MG tablet   Oral   Take by mouth daily.         . isosorbide mononitrate (IMDUR) 30 MG 24 hr tablet   Oral   Take 30 mg by mouth daily.         Marland Kitchen omeprazole (PRILOSEC) 20 MG capsule   Oral   Take 20 mg by mouth daily.          BP 167/105  Pulse 78  Temp(Src) 98.5 F (36.9 C) (Oral)  Resp 16  Ht 5\' 7"  (1.702 m)  Wt 195 lb (88.451 kg)  BMI 30.53 kg/m2  SpO2 97%  Physical Exam General: Well-developed, well-nourished male in no acute distress; appearance consistent with age of record HENT: normocephalic, multiple bandages at sites of recent biopsies and cryo-destruction; dentures Eyes: pupils equal round and reactive to light; extraocular muscles intact Neck: supple Heart: regular rate and rhythm Lungs: clear to auscultation bilaterally Abdomen: soft; nondistended; nontender; no masses or hepatosplenomegaly; bowel sounds present Extremities: No deformity; full range of motion; pulses normal Neurologic: Awake, alert and oriented; motor  function intact in all extremities and symmetric; no facial droop Skin: Warm and dry Psychiatric: Normal mood and affect    ED Course   Procedures (including critical care time)     MDM  4:48 AM No relief with IV glucagon.  5:08 AM Dr. Matthias Hughs to see patient in ED.   Hanley Seamen, MD 05/28/13 [phone]

## 2013-05-28 NOTE — Op Note (Signed)
[facility] [address] [place], [address]   ENDOSCOPY PROCEDURE REPORT  PATIENT: Jonathan, [name]  MR#: [id number] BIRTHDATE: 10-31-1930 , 81  yrs. old GENDER: Male ENDOSCOPIST:Daylyn Fumiko Cham, MD REFERRED BY:  [facility] (Patient of Dr. Benjaman Kindler) (primary gastroenterologist is Dr. will outlaw) PROCEDURE DATE:  05/28/2013 PROCEDURE:    upper endoscopy with removal of foreign body ASA CLASS: INDICATIONS:   food impaction which occurred last night and has not responded to intravenous glucagon in the emergency MEDICATION:    fentanyl 50 mcg, Versed 4 mg IV TOPICAL ANESTHETIC:    Cetacaine spray  DESCRIPTION OF PROCEDURE:   the patient was brought from the emergency room to the [facility] long endoscopy unit. Time out was performed. The above sedation was administered and he remained stable throughout the procedure.  The Pentax adult video endoscope was passed under direct vision. The vocal cords looked normal. It was a little bit difficult to enter the esophagus. The cricopharyngeal region felt somewhat "tight" as might occur if there were some cervical osteophytes. In any event, the scope was able to be passed into the esophagus, the mucosa of which had some punctate erythema but no erosions, ulcerations, varices, masses or neoplasia, or evidence of Barrett's esophagus as determined at the end of the procedure.  In the distal esophagus, there was a large amount of granular food debris as well as some fluid. I made a single blind passage with the [facility] retrieval basket to try to engage some solid food at the bottom of the possible, and indeed this was successful in removing a small to moderate sized clump of amorphous food debris. I then attempted to use the Bio-Vac device to suction out fluid and small food particles. This was partially successful, but the larger food particles kept clogging the scope so this was of limited effectiveness.  The scope was then  advanced along the food impaction and was able to be guided into the stomach. In doing so, some food debris was pushed into the stomach and the food impaction was relieved. I then went up and down the esophagus several additional times, using the scope to push residual food debris in the distal esophagus down into the stomach.  Careful inspection of the region of the gastroesophageal junction showed a somewhat fibrotic Schatzki's ring which offered slight resistance to passage of the endoscope. However, it appeared fairly widely patent. It did not appear fractured. As noted above, no significant distal esophageal mucosal abnormalities were observed. There was some mild inflammatory change right at the level of the esophageal ring. Below the ring was a small hiatal hernia.  The stomach was entered. It contained a small to moderate residual of food debris. The distal gastric mucosa was appeared grossly normal. The pylorus, duodenal bulb, and second duodenum looked normal as well.  A retroflex view showed a normal cardia.  The scope was then removed from the patient. He tolerated the procedure well and there were no apparent complications. At the conclusion of the procedure, the distal esophagus was essentially free of any residual food impaction     COMPLICATIONS: None  ENDOSCOPIC IMPRESSION:  1. Food impaction, successfully relieved endoscopically as described above 2. Fairly prominent Schatzki's ring, which accounted for the food impaction  RECOMMENDATIONS:  1. Increase omeprazole to 20 mg twice a day, at least temporarily 2. I will have our office contact the patient to set up an esophageal dilatation by Dr. Dulce Sellar in the near future  _______________________________ eSignedBernette Redbird, MD 05/28/2013 7:58 AM    PATIENT NAME:  Jonathan [name], Jonathan [name] MR#: [id number]

## 2013-05-28 NOTE — H&P (Signed)
  79 year old gentleman presents from the emergency room to the [facility] long endoscopy unit because of a food impaction of approximately 11 hours duration. He has a known esophageal ring, with her previous food impaction 2 years ago, and esophageal dilatation performed 2 years ago and, more recently, approximately 1 year ago by Dr. Dulce Sellar. The patient was eating chicken last night when the food got stuck in his esophagus. He had been having fairly frequent recurrent dysphagia symptoms in recent weeks. He has been in the hospital for the past 4 hours or so, but the patient is still having secretions coming up, suggesting the persistence of the food impaction. Intravenous glucagon in the emergency room was not effective in getting the food bolus did pass into the stomach.  Past medical history:  Allergies: None  Medications: Ecotrin, CaDuet, finasteride, isosorbide mononitrate, omeprazole, captopril  Operations: Bilateral hip replacements, corneal transplants, hernia repair  Chronic medical illnesses: Single vessel coronary disease, managed medically, apparently with previous history of MI some years ago. No recent anginal problems. Also history of diet controlled glucose intolerance, and hypertension. Also, history of above-mentioned esophageal ring with history of food impaction  Physical examination:  This is a fairly well preserved, somewhat overweight elderly Caucasian male, in no acute distress. He is using the suction catheter to control oral secretions. He is anicteric and without pallor. Chest is clear. The heart has a regular rhythm, no murmurs. Abdomen is somewhat obese, but soft and nontender. Mental status is normal, no obvious neurologic deficits.  Impression: Food impaction in a patient with a prior history of food impaction and with a known esophageal ring, last dilated about a year ago  Plan: Proceed to upper endoscopy with foreign body removal. The patient is familiar with the  procedure. The risks were reviewed briefly with the patient and his wife.  Florencia Reasons, M.D. [phone]

## 2013-05-28 NOTE — ED Notes (Signed)
Pt stated he has a piece of chicken stuck in his esophagus.  Pt stated hx of this x2 in the past.  Pt hx of esophageal stretching in the past, as well.

## 2013-05-31 ENCOUNTER — Encounter (HOSPITAL_COMMUNITY): Payer: Self-pay | Admitting: Gastroenterology

## 2013-06-03 ENCOUNTER — Encounter (HOSPITAL_COMMUNITY): Payer: Self-pay | Admitting: Pharmacy Technician

## 2013-06-03 ENCOUNTER — Encounter (HOSPITAL_COMMUNITY): Payer: Self-pay | Admitting: *Deleted

## 2013-06-08 DIAGNOSIS — H02409 Unspecified ptosis of unspecified eyelid: Secondary | ICD-10-CM | POA: Diagnosis not present

## 2013-06-08 DIAGNOSIS — H02839 Dermatochalasis of unspecified eye, unspecified eyelid: Secondary | ICD-10-CM | POA: Diagnosis not present

## 2013-06-08 DIAGNOSIS — H57819 Brow ptosis, unspecified: Secondary | ICD-10-CM | POA: Insufficient documentation

## 2013-06-15 ENCOUNTER — Other Ambulatory Visit: Payer: Self-pay | Admitting: Gastroenterology

## 2013-06-15 NOTE — Addendum Note (Signed)
Addended by: Lilith Solana on: 06/15/2013 04:30 PM   Modules accepted: Orders  

## 2013-06-16 ENCOUNTER — Ambulatory Visit (HOSPITAL_COMMUNITY)
Admission: RE | Admit: 2013-06-16 | Discharge: 2013-06-16 | Disposition: A | Discharge status: Home / Self Care | Payer: Medicare Other | Source: Ambulatory Visit | Attending: Gastroenterology | Admitting: Gastroenterology

## 2013-06-16 ENCOUNTER — Encounter (HOSPITAL_COMMUNITY): Payer: Self-pay | Admitting: Anesthesiology

## 2013-06-16 ENCOUNTER — Ambulatory Visit (HOSPITAL_COMMUNITY): Payer: Medicare Other | Admitting: Anesthesiology

## 2013-06-16 ENCOUNTER — Encounter (HOSPITAL_COMMUNITY)
Admission: RE | Disposition: A | Discharge status: Home / Self Care | Payer: Self-pay | Source: Ambulatory Visit | Attending: Gastroenterology

## 2013-06-16 DIAGNOSIS — I2510 Atherosclerotic heart disease of native coronary artery without angina pectoris: Secondary | ICD-10-CM | POA: Insufficient documentation

## 2013-06-16 DIAGNOSIS — K449 Diaphragmatic hernia without obstruction or gangrene: Secondary | ICD-10-CM | POA: Insufficient documentation

## 2013-06-16 DIAGNOSIS — K222 Esophageal obstruction: Secondary | ICD-10-CM | POA: Insufficient documentation

## 2013-06-16 DIAGNOSIS — Z96649 Presence of unspecified artificial hip joint: Secondary | ICD-10-CM | POA: Diagnosis not present

## 2013-06-16 DIAGNOSIS — I252 Old myocardial infarction: Secondary | ICD-10-CM | POA: Diagnosis not present

## 2013-06-16 DIAGNOSIS — Q391 Atresia of esophagus with tracheo-esophageal fistula: Secondary | ICD-10-CM | POA: Diagnosis not present

## 2013-06-16 DIAGNOSIS — I10 Essential (primary) hypertension: Secondary | ICD-10-CM | POA: Insufficient documentation

## 2013-06-16 DIAGNOSIS — T18108A Unspecified foreign body in esophagus causing other injury, initial encounter: Secondary | ICD-10-CM | POA: Insufficient documentation

## 2013-06-16 DIAGNOSIS — K2940 Chronic atrophic gastritis without bleeding: Secondary | ICD-10-CM | POA: Diagnosis not present

## 2013-06-16 DIAGNOSIS — K219 Gastro-esophageal reflux disease without esophagitis: Secondary | ICD-10-CM | POA: Diagnosis not present

## 2013-06-16 DIAGNOSIS — K2970 Gastritis, unspecified, without bleeding: Secondary | ICD-10-CM | POA: Diagnosis not present

## 2013-06-16 DIAGNOSIS — K2990 Gastroduodenitis, unspecified, without bleeding: Secondary | ICD-10-CM | POA: Diagnosis not present

## 2013-06-16 DIAGNOSIS — IMO0002 Reserved for concepts with insufficient information to code with codable children: Secondary | ICD-10-CM | POA: Insufficient documentation

## 2013-06-16 DIAGNOSIS — R1310 Dysphagia, unspecified: Secondary | ICD-10-CM | POA: Diagnosis not present

## 2013-06-16 HISTORY — DX: Acute myocardial infarction, unspecified: I21.9

## 2013-06-16 HISTORY — PX: BALLOON DILATION: SHX5330

## 2013-06-16 HISTORY — DX: Gastro-esophageal reflux disease without esophagitis: K21.9

## 2013-06-16 HISTORY — DX: Unspecified osteoarthritis, unspecified site: M19.90

## 2013-06-16 HISTORY — PX: ESOPHAGOGASTRODUODENOSCOPY (EGD) WITH PROPOFOL: SHX5813

## 2013-06-16 SURGERY — ESOPHAGOGASTRODUODENOSCOPY (EGD) WITH PROPOFOL
Anesthesia: Monitor Anesthesia Care

## 2013-06-16 MED ORDER — PROPOFOL INFUSION 10 MG/ML OPTIME
INTRAVENOUS | Status: DC | PRN
  Administered 2013-06-16: 160 ug/kg/min via INTRAVENOUS

## 2013-06-16 MED ORDER — SODIUM CHLORIDE 0.9 % IV SOLN: INTRAVENOUS | Status: DC

## 2013-06-16 MED ORDER — LACTATED RINGERS IV SOLN
INTRAVENOUS | Status: DC
  Administered 2013-06-16: 1000 mL via INTRAVENOUS

## 2013-06-16 MED ORDER — BUTAMBEN-TETRACAINE-BENZOCAINE 2-2-14 % EX AERO
INHALATION_SPRAY | CUTANEOUS | Status: DC | PRN
  Administered 2013-06-16: 2 via TOPICAL

## 2013-06-16 SURGICAL SUPPLY — 14 items

## 2013-06-16 NOTE — Interval H&P Note (Signed)
History and Physical Interval Note:  06/16/2013 7:57 AM  Jonathan [name]  has presented today for surgery, with the diagnosis of dysphagia  The various methods of treatment have been discussed with the patient and family. After consideration of risks, benefits and other options for treatment, the patient has consented to  Procedure(s): ESOPHAGOGASTRODUODENOSCOPY (EGD) WITH PROPOFOL (N/A) BALLOON DILATION (N/A) as a surgical intervention .  The patient's history has been reviewed, patient examined, no change in status, stable for surgery.  I have reviewed the patient's chart and labs.  Questions were answered to the patient's satisfaction.     Jonathan [name] M  Assessment:  1.  Dysphagia with recent esophageal food impaction. 2.  History Schatzki's ring.  Plan:  1.  Endoscopy with esophageal dilatation (balloon planned). 2.  Risks (bleeding, infection, bowel perforation that could require surgery, sedation-related changes in cardiopulmonary systems), benefits (identification and possible treatment of source of symptoms, exclusion of certain causes of symptoms), and alternatives (watchful waiting, radiographic imaging studies, empiric medical treatment) of upper endoscopy with esophageal dilatation (EGD +/- DIL) were explained to patient in detail and patient wishes to proceed.

## 2013-06-16 NOTE — H&P (View-Only) (Signed)
  79-year-old gentleman presents from the emergency room to the [place] endoscopy unit because of a food impaction of approximately 11 hours duration. He has a known esophageal ring, with her previous food impaction 2 years ago, and esophageal dilatation performed 2 years ago and, more recently, approximately 1 year ago by Dr. outlaw. The patient was eating chicken last night when the food got stuck in his esophagus. He had been having fairly frequent recurrent dysphagia symptoms in recent weeks. He has been in the hospital for the past 4 hours or so, but the patient is still having secretions coming up, suggesting the persistence of the food impaction. Intravenous glucagon in the emergency room was not effective in getting the food bolus did pass into the stomach.  Past medical history:  Allergies: None  Medications: Ecotrin, CaDuet, finasteride, isosorbide mononitrate, omeprazole, captopril  Operations: Bilateral hip replacements, corneal transplants, hernia repair  Chronic medical illnesses: Single vessel coronary disease, managed medically, apparently with previous history of MI some years ago. No recent anginal problems. Also history of diet controlled glucose intolerance, and hypertension. Also, history of above-mentioned esophageal ring with history of food impaction  Physical examination:  This is a fairly well preserved, somewhat overweight elderly Caucasian male, in no acute distress. He is using the suction catheter to control oral secretions. He is anicteric and without pallor. Chest is clear. The heart has a regular rhythm, no murmurs. Abdomen is somewhat obese, but soft and nontender. Mental status is normal, no obvious neurologic deficits.  Impression: Food impaction in a patient with a prior history of food impaction and with a known esophageal ring, last dilated about a year ago  Plan: Proceed to upper endoscopy with foreign body removal. The patient is familiar with the  procedure. The risks were reviewed briefly with the patient and his wife.  Jaevion V. Adonai Selsor, M.D. [phone]   

## 2013-06-16 NOTE — Op Note (Signed)
[facility] [address] [place], [address]   ENDOSCOPY PROCEDURE REPORT  PATIENT: Jonathan [name], Jonathan [name]  MR#: [id number] BIRTHDATE: [birth year] , 81  yrs. old GENDER: Male ENDOSCOPIST: Willis Modena, MD REFERRED BY:  Theressa Millard, M.D. PROCEDURE DATE:  06/16/2013 PROCEDURE:  Endoscopy (EGD) with balloon dilatation of the esophagus  ASA CLASS:     Class III INDICATIONS:  dysphagia, Schatzki's ring, recent esophageal food impaction. MEDICATIONS: MAC sedation, administered by CRNA TOPICAL ANESTHETIC: Cetacaine Spray  DESCRIPTION OF PROCEDURE: After the risks benefits and alternatives of the procedure were thoroughly explained, informed consent was obtained.  The Pentax Gastroscope [id number] endoscope was introduced through the mouth and advanced to the second portion of the duodenum. Without limitations.  The instrument was slowly withdrawn as the mucosa was fully examined.     Findings:  Tortuous esophagus.  Moderate hiatal hernia.  Schatzki's ring of estimated diameter 12mm, through which the endoscope passed with mild resistance.  Mild diffuse gastritis, otherwise normal endoscopy to the second portion of the duodenum.  Upon completion of our diagnostic exam, the ring was serially dilated from 12mm (minimal resistance) to 13.11mm (mild resistance) to 15mm (mild-to-moderate resistance) to 16.5 mm (moderate resistance) with TTS balloon dilation cathethers.  There was some ring disruption and mild bloody show after dilation.      The scope was then withdrawn from the patient and the procedure completed.  ENDOSCOPIC IMPRESSION:     As above.  Successful dilatation of Schatzki's ring.  RECOMMENDATIONS:     1.  Watch for potential complications of procedure. 2.  I suspect dysphagia is multifactorial (GERD, dysmotility, ring). I would chew food carefully, take small bites, eat small amounts of food at a time. 3.  Continue omeprazole 20 mg once-a-day  indefinitely. 4.  Repeat endoscopy, as needed, based on evolution of dysphagia symptoms. 5.  Follow-up with [facility] on as-needed basis.  eSigned:  Willis Modena, MD 06/16/2013 9:01 AM   CC:

## 2013-06-16 NOTE — Anesthesia Postprocedure Evaluation (Signed)
  Anesthesia Post-op Note  Patient: Jonathan [name]  Procedure(s) Performed: Procedure(s) (LRB): ESOPHAGOGASTRODUODENOSCOPY (EGD) WITH PROPOFOL (N/A) BALLOON DILATION (N/A)  Patient Location: PACU  Anesthesia Type: MAC  Level of Consciousness: awake and alert   Airway and Oxygen Therapy: Patient Spontanous Breathing  Post-op Pain: mild  Post-op Assessment: Post-op Vital signs reviewed, Patient's Cardiovascular Status Stable, Respiratory Function Stable, Patent Airway and No signs of Nausea or vomiting  Last Vitals:  Filed Vitals:   06/16/13 0851  BP:   Pulse:   Temp: 36.6 C  Resp:     Post-op Vital Signs: stable   Complications: No apparent anesthesia complications

## 2013-06-16 NOTE — Anesthesia Preprocedure Evaluation (Signed)
Anesthesia Evaluation  Patient identified by MRN, date of birth, ID band Patient awake    Reviewed: Allergy & Precautions, H&P , NPO status , Patient's Chart, lab work & pertinent test results  Airway Mallampati: II TM Distance: >3 FB Neck ROM: Full    Dental no notable dental hx.    Pulmonary neg pulmonary ROS,  breath sounds clear to auscultation  Pulmonary exam normal       Cardiovascular Exercise Tolerance: Good hypertension, Pt. on medications + Past MI negative cardio ROS  Rhythm:Regular Rate:Normal     Neuro/Psych negative neurological ROS  negative psych ROS   GI/Hepatic Neg liver ROS, GERD-  Medicated,  Endo/Other  negative endocrine ROS  Renal/GU negative Renal ROS  negative genitourinary   Musculoskeletal negative musculoskeletal ROS (+)   Abdominal (+) + obese,   Peds negative pediatric ROS (+)  Hematology negative hematology ROS (+)   Anesthesia Other Findings   Reproductive/Obstetrics negative OB ROS                           Anesthesia Physical Anesthesia Plan  ASA: III  Anesthesia Plan: MAC   Post-op Pain Management:    Induction: Intravenous  Airway Management Planned:   Additional Equipment:   Intra-op Plan:   Post-operative Plan:   Informed Consent: I have reviewed the patients History and Physical, chart, labs and discussed the procedure including the risks, benefits and alternatives for the proposed anesthesia with the patient or authorized representative who has indicated his/her understanding and acceptance.   Dental advisory given  Plan Discussed with: CRNA  Anesthesia Plan Comments:         Anesthesia Quick Evaluation

## 2013-06-16 NOTE — Preoperative (Signed)
Beta Blockers   Reason not to administer Beta Blockers:Not Applicable 

## 2013-06-16 NOTE — Transfer of Care (Signed)
Immediate Anesthesia Transfer of Care Note  Patient: Jonathan [name]  Procedure(s) Performed: Procedure(s): ESOPHAGOGASTRODUODENOSCOPY (EGD) WITH PROPOFOL (N/A) BALLOON DILATION (N/A)  Patient Location: PACU and Endoscopy Unit  Anesthesia Type:MAC  Level of Consciousness: sedated and patient cooperative  Airway & Oxygen Therapy: Patient Spontanous Breathing and Patient connected to nasal cannula oxygen  Post-op Assessment: Report given to PACU RN and Post -op Vital signs reviewed and stable  Post vital signs: Reviewed and stable  Complications: No apparent anesthesia complications

## 2013-06-17 ENCOUNTER — Encounter (HOSPITAL_COMMUNITY): Payer: Self-pay | Admitting: Gastroenterology

## 2013-06-23 DIAGNOSIS — N500 Atrophy of testis: Secondary | ICD-10-CM | POA: Diagnosis not present

## 2013-06-23 DIAGNOSIS — R972 Elevated prostate specific antigen [PSA]: Secondary | ICD-10-CM | POA: Diagnosis not present

## 2013-06-23 DIAGNOSIS — N529 Male erectile dysfunction, unspecified: Secondary | ICD-10-CM | POA: Diagnosis not present

## 2013-06-23 DIAGNOSIS — N400 Enlarged prostate without lower urinary tract symptoms: Secondary | ICD-10-CM | POA: Diagnosis not present

## 2013-07-01 DIAGNOSIS — M79609 Pain in unspecified limb: Secondary | ICD-10-CM | POA: Diagnosis not present

## 2013-07-01 DIAGNOSIS — B351 Tinea unguium: Secondary | ICD-10-CM | POA: Diagnosis not present

## 2013-09-14 DIAGNOSIS — M545 Low back pain, unspecified: Secondary | ICD-10-CM | POA: Diagnosis not present

## 2013-09-14 DIAGNOSIS — Z1331 Encounter for screening for depression: Secondary | ICD-10-CM | POA: Diagnosis not present

## 2013-09-14 DIAGNOSIS — Z0000 Encounter for general adult medical examination without abnormal findings: Secondary | ICD-10-CM | POA: Diagnosis not present

## 2013-09-14 DIAGNOSIS — Z23 Encounter for immunization: Secondary | ICD-10-CM | POA: Diagnosis not present

## 2013-09-14 DIAGNOSIS — I252 Old myocardial infarction: Secondary | ICD-10-CM | POA: Diagnosis not present

## 2013-09-14 DIAGNOSIS — E119 Type 2 diabetes mellitus without complications: Secondary | ICD-10-CM | POA: Diagnosis not present

## 2013-09-14 DIAGNOSIS — I10 Essential (primary) hypertension: Secondary | ICD-10-CM | POA: Diagnosis not present

## 2013-09-14 DIAGNOSIS — E669 Obesity, unspecified: Secondary | ICD-10-CM | POA: Diagnosis not present

## 2013-09-21 ENCOUNTER — Other Ambulatory Visit: Payer: Self-pay | Admitting: Dermatology

## 2013-09-21 DIAGNOSIS — C44611 Basal cell carcinoma of skin of unspecified upper limb, including shoulder: Secondary | ICD-10-CM | POA: Diagnosis not present

## 2013-09-21 DIAGNOSIS — L538 Other specified erythematous conditions: Secondary | ICD-10-CM | POA: Diagnosis not present

## 2013-09-21 DIAGNOSIS — Z85828 Personal history of other malignant neoplasm of skin: Secondary | ICD-10-CM | POA: Diagnosis not present

## 2013-09-21 DIAGNOSIS — B079 Viral wart, unspecified: Secondary | ICD-10-CM | POA: Diagnosis not present

## 2013-09-21 DIAGNOSIS — L570 Actinic keratosis: Secondary | ICD-10-CM | POA: Diagnosis not present

## 2013-09-21 DIAGNOSIS — L851 Acquired keratosis [keratoderma] palmaris et plantaris: Secondary | ICD-10-CM | POA: Diagnosis not present

## 2013-09-21 DIAGNOSIS — L821 Other seborrheic keratosis: Secondary | ICD-10-CM | POA: Diagnosis not present

## 2013-09-21 DIAGNOSIS — D485 Neoplasm of uncertain behavior of skin: Secondary | ICD-10-CM | POA: Diagnosis not present

## 2013-09-27 ENCOUNTER — Encounter: Payer: Self-pay | Admitting: Podiatry

## 2013-09-27 ENCOUNTER — Ambulatory Visit (INDEPENDENT_AMBULATORY_CARE_PROVIDER_SITE_OTHER): Payer: Medicare Other | Admitting: Podiatry

## 2013-09-27 VITALS — BP 135/70 | HR 74 | Resp 16

## 2013-09-27 DIAGNOSIS — B351 Tinea unguium: Secondary | ICD-10-CM

## 2013-09-27 DIAGNOSIS — M79609 Pain in unspecified limb: Secondary | ICD-10-CM | POA: Diagnosis not present

## 2013-09-27 NOTE — Progress Notes (Signed)
Subjective:     Patient ID: Jonathan [name], male   DOB: 08-19-1931, 79 y.o.   MRN: [id number]  HPI patient is found to have thick painful nailbeds 1-5 both feet that he cannot cut   Review of Systems     Objective:   Physical Exam Neurovascular status unchanged with nail disease and thickness of bed 1-5 both feet with pain    Assessment:     Mycotic nail infection 1-5 both feet with pain    Plan:     Debridement painful nail bed 1-5 both feet with no bleeding noted

## 2013-10-14 DIAGNOSIS — S129XXA Fracture of neck, unspecified, initial encounter: Secondary | ICD-10-CM

## 2013-10-14 HISTORY — DX: Fracture of neck, unspecified, initial encounter: S12.9XXA

## 2013-12-20 ENCOUNTER — Encounter: Payer: Self-pay | Admitting: Podiatry

## 2013-12-20 ENCOUNTER — Ambulatory Visit (INDEPENDENT_AMBULATORY_CARE_PROVIDER_SITE_OTHER): Payer: Medicare Other | Admitting: Podiatry

## 2013-12-20 VITALS — BP 117/64 | HR 77 | Resp 12

## 2013-12-20 DIAGNOSIS — M79609 Pain in unspecified limb: Secondary | ICD-10-CM | POA: Diagnosis not present

## 2013-12-20 DIAGNOSIS — B351 Tinea unguium: Secondary | ICD-10-CM

## 2013-12-21 NOTE — Progress Notes (Signed)
Subjective:     Patient ID: Jonathan [name], male   DOB: 1931/02/07, 80 y.o.   MRN: [id number]  HPI patient presents with painful thick nailbeds 1-5 both feet that are impossible for him to cut due to hand weakness   Review of Systems     Objective:   Physical Exam Neurovascular status intact with patient well oriented and thick painful nailbeds 1-5 of both feet   Assessment:     Mycotic nail infection with pain 1-5 both feet    Plan:     Debridement of painful nailbeds 1-5 both feet with no iatrogenic bleeding noted

## 2013-12-23 DIAGNOSIS — I252 Old myocardial infarction: Secondary | ICD-10-CM | POA: Diagnosis not present

## 2013-12-23 DIAGNOSIS — I10 Essential (primary) hypertension: Secondary | ICD-10-CM | POA: Diagnosis not present

## 2013-12-23 DIAGNOSIS — E119 Type 2 diabetes mellitus without complications: Secondary | ICD-10-CM | POA: Diagnosis not present

## 2013-12-23 DIAGNOSIS — M545 Low back pain, unspecified: Secondary | ICD-10-CM | POA: Diagnosis not present

## 2014-02-08 DIAGNOSIS — W1809XA Striking against other object with subsequent fall, initial encounter: Secondary | ICD-10-CM | POA: Diagnosis not present

## 2014-02-08 DIAGNOSIS — S12100A Unspecified displaced fracture of second cervical vertebra, initial encounter for closed fracture: Secondary | ICD-10-CM | POA: Diagnosis not present

## 2014-02-08 DIAGNOSIS — E785 Hyperlipidemia, unspecified: Secondary | ICD-10-CM | POA: Diagnosis not present

## 2014-02-08 DIAGNOSIS — IMO0002 Reserved for concepts with insufficient information to code with codable children: Secondary | ICD-10-CM | POA: Diagnosis not present

## 2014-02-08 DIAGNOSIS — S0180XA Unspecified open wound of other part of head, initial encounter: Secondary | ICD-10-CM | POA: Diagnosis not present

## 2014-02-08 DIAGNOSIS — S199XXA Unspecified injury of neck, initial encounter: Secondary | ICD-10-CM | POA: Diagnosis not present

## 2014-02-08 DIAGNOSIS — K219 Gastro-esophageal reflux disease without esophagitis: Secondary | ICD-10-CM | POA: Diagnosis not present

## 2014-02-08 DIAGNOSIS — S060X9A Concussion with loss of consciousness of unspecified duration, initial encounter: Secondary | ICD-10-CM | POA: Diagnosis not present

## 2014-02-08 DIAGNOSIS — I10 Essential (primary) hypertension: Secondary | ICD-10-CM | POA: Diagnosis not present

## 2014-02-08 DIAGNOSIS — S0993XA Unspecified injury of face, initial encounter: Secondary | ICD-10-CM | POA: Diagnosis not present

## 2014-02-08 DIAGNOSIS — G44319 Acute post-traumatic headache, not intractable: Secondary | ICD-10-CM | POA: Diagnosis not present

## 2014-02-10 DIAGNOSIS — S12100A Unspecified displaced fracture of second cervical vertebra, initial encounter for closed fracture: Secondary | ICD-10-CM | POA: Diagnosis not present

## 2014-02-16 DIAGNOSIS — M542 Cervicalgia: Secondary | ICD-10-CM | POA: Diagnosis not present

## 2014-02-16 DIAGNOSIS — Z6831 Body mass index (BMI) 31.0-31.9, adult: Secondary | ICD-10-CM | POA: Diagnosis not present

## 2014-03-28 ENCOUNTER — Encounter: Payer: Self-pay | Admitting: Podiatry

## 2014-03-28 ENCOUNTER — Ambulatory Visit (INDEPENDENT_AMBULATORY_CARE_PROVIDER_SITE_OTHER): Payer: Medicare Other | Admitting: Podiatry

## 2014-03-28 VITALS — BP 126/64 | HR 84 | Resp 12

## 2014-03-28 DIAGNOSIS — M79609 Pain in unspecified limb: Secondary | ICD-10-CM | POA: Diagnosis not present

## 2014-03-28 DIAGNOSIS — B351 Tinea unguium: Secondary | ICD-10-CM

## 2014-03-28 NOTE — Progress Notes (Signed)
Subjective:     Patient ID: Jonathan [name], male   DOB: 1931-01-21, 80 y.o.   MRN: [id number]  HPI patient presents with thick yellow nailbeds 1-5 both feet that are painful when pressed   Review of Systems     Objective:   Physical Exam Neurovascular status intact with thick yellow brittle nailbeds 1-5 both feet    Assessment:     Mycotic nail infection with pain 1-5 both feet    Plan:     Debris painful nailbeds 1-5 both feet with no iatrogenic bleeding noted

## 2014-03-30 DIAGNOSIS — H02839 Dermatochalasis of unspecified eye, unspecified eyelid: Secondary | ICD-10-CM | POA: Diagnosis not present

## 2014-03-30 DIAGNOSIS — Z947 Corneal transplant status: Secondary | ICD-10-CM | POA: Diagnosis not present

## 2014-03-30 DIAGNOSIS — H04129 Dry eye syndrome of unspecified lacrimal gland: Secondary | ICD-10-CM | POA: Diagnosis not present

## 2014-03-31 DIAGNOSIS — I10 Essential (primary) hypertension: Secondary | ICD-10-CM | POA: Diagnosis not present

## 2014-03-31 DIAGNOSIS — Z6830 Body mass index (BMI) 30.0-30.9, adult: Secondary | ICD-10-CM | POA: Diagnosis not present

## 2014-03-31 DIAGNOSIS — E119 Type 2 diabetes mellitus without complications: Secondary | ICD-10-CM | POA: Diagnosis not present

## 2014-03-31 DIAGNOSIS — IMO0002 Reserved for concepts with insufficient information to code with codable children: Secondary | ICD-10-CM | POA: Diagnosis not present

## 2014-03-31 DIAGNOSIS — Z23 Encounter for immunization: Secondary | ICD-10-CM | POA: Diagnosis not present

## 2014-03-31 DIAGNOSIS — M542 Cervicalgia: Secondary | ICD-10-CM | POA: Diagnosis not present

## 2014-03-31 DIAGNOSIS — I252 Old myocardial infarction: Secondary | ICD-10-CM | POA: Diagnosis not present

## 2014-05-12 DIAGNOSIS — H02409 Unspecified ptosis of unspecified eyelid: Secondary | ICD-10-CM | POA: Diagnosis not present

## 2014-05-12 DIAGNOSIS — H0230 Blepharochalasis unspecified eye, unspecified eyelid: Secondary | ICD-10-CM | POA: Diagnosis not present

## 2014-06-06 ENCOUNTER — Other Ambulatory Visit: Payer: Self-pay | Admitting: Dermatology

## 2014-06-06 DIAGNOSIS — D485 Neoplasm of uncertain behavior of skin: Secondary | ICD-10-CM | POA: Diagnosis not present

## 2014-06-06 DIAGNOSIS — Z85828 Personal history of other malignant neoplasm of skin: Secondary | ICD-10-CM | POA: Diagnosis not present

## 2014-06-06 DIAGNOSIS — C44319 Basal cell carcinoma of skin of other parts of face: Secondary | ICD-10-CM | POA: Diagnosis not present

## 2014-06-06 DIAGNOSIS — L570 Actinic keratosis: Secondary | ICD-10-CM | POA: Diagnosis not present

## 2014-06-06 DIAGNOSIS — L821 Other seborrheic keratosis: Secondary | ICD-10-CM | POA: Diagnosis not present

## 2014-06-15 ENCOUNTER — Other Ambulatory Visit: Payer: Self-pay | Admitting: Dermatology

## 2014-06-15 DIAGNOSIS — C44319 Basal cell carcinoma of skin of other parts of face: Secondary | ICD-10-CM | POA: Diagnosis not present

## 2014-06-15 DIAGNOSIS — Z85828 Personal history of other malignant neoplasm of skin: Secondary | ICD-10-CM | POA: Diagnosis not present

## 2014-06-24 DIAGNOSIS — N529 Male erectile dysfunction, unspecified: Secondary | ICD-10-CM | POA: Diagnosis not present

## 2014-06-24 DIAGNOSIS — R972 Elevated prostate specific antigen [PSA]: Secondary | ICD-10-CM | POA: Diagnosis not present

## 2014-06-24 DIAGNOSIS — N400 Enlarged prostate without lower urinary tract symptoms: Secondary | ICD-10-CM | POA: Diagnosis not present

## 2014-06-24 DIAGNOSIS — N500 Atrophy of testis: Secondary | ICD-10-CM | POA: Diagnosis not present

## 2014-07-05 DIAGNOSIS — E119 Type 2 diabetes mellitus without complications: Secondary | ICD-10-CM | POA: Diagnosis not present

## 2014-07-05 DIAGNOSIS — K222 Esophageal obstruction: Secondary | ICD-10-CM | POA: Diagnosis not present

## 2014-07-05 DIAGNOSIS — Z23 Encounter for immunization: Secondary | ICD-10-CM | POA: Diagnosis not present

## 2014-07-05 DIAGNOSIS — I10 Essential (primary) hypertension: Secondary | ICD-10-CM | POA: Diagnosis not present

## 2014-07-05 DIAGNOSIS — R5383 Other fatigue: Secondary | ICD-10-CM | POA: Diagnosis not present

## 2014-07-05 DIAGNOSIS — R5381 Other malaise: Secondary | ICD-10-CM | POA: Diagnosis not present

## 2014-07-14 ENCOUNTER — Ambulatory Visit (INDEPENDENT_AMBULATORY_CARE_PROVIDER_SITE_OTHER): Payer: Medicare Other | Admitting: Podiatry

## 2014-07-14 DIAGNOSIS — B351 Tinea unguium: Secondary | ICD-10-CM | POA: Diagnosis not present

## 2014-07-14 DIAGNOSIS — M79673 Pain in unspecified foot: Secondary | ICD-10-CM | POA: Diagnosis not present

## 2014-07-14 NOTE — Progress Notes (Signed)
   Subjective:    Patient ID: Jonathan [name], male    DOB: Sep 24, 1931, 80 y.o.   MRN: [id number]  HPI  Pt presents for nail debridement  Review of Systems     Objective:   Physical Exam        Assessment & Plan:

## 2014-07-14 NOTE — Progress Notes (Signed)
Subjective:     Patient ID: Jonathan [name], male   DOB: Feb 28, 1931, 80 y.o.   MRN: [id number]  HPI patient presents with nail disease and thickness 1-5 both feet that are painful when pressed and he cannot take care of   Review of Systems     Objective:   Physical Exam Neurovascular status found to be intact with thick yellow brittle nailbeds 1-5 both feet that are painful when pressed    Assessment:     Mycotic nail infection with pain 1-5 both feet    Plan:     Debride painful nailbeds 1-5 both feet with no iatrogenic bleeding noted

## 2014-08-09 DIAGNOSIS — I2583 Coronary atherosclerosis due to lipid rich plaque: Secondary | ICD-10-CM | POA: Diagnosis not present

## 2014-08-09 DIAGNOSIS — Z9889 Other specified postprocedural states: Secondary | ICD-10-CM

## 2014-08-09 DIAGNOSIS — H2703 Aphakia, bilateral: Secondary | ICD-10-CM | POA: Diagnosis not present

## 2014-08-09 DIAGNOSIS — R112 Nausea with vomiting, unspecified: Secondary | ICD-10-CM

## 2014-08-09 DIAGNOSIS — E119 Type 2 diabetes mellitus without complications: Secondary | ICD-10-CM | POA: Diagnosis not present

## 2014-08-09 DIAGNOSIS — H0234 Blepharochalasis left upper eyelid: Secondary | ICD-10-CM | POA: Diagnosis not present

## 2014-08-09 DIAGNOSIS — H02403 Unspecified ptosis of bilateral eyelids: Secondary | ICD-10-CM | POA: Diagnosis not present

## 2014-08-09 DIAGNOSIS — I2510 Atherosclerotic heart disease of native coronary artery without angina pectoris: Secondary | ICD-10-CM | POA: Diagnosis not present

## 2014-08-09 DIAGNOSIS — I10 Essential (primary) hypertension: Secondary | ICD-10-CM | POA: Diagnosis not present

## 2014-08-09 DIAGNOSIS — I252 Old myocardial infarction: Secondary | ICD-10-CM | POA: Diagnosis not present

## 2014-08-09 DIAGNOSIS — H0231 Blepharochalasis right upper eyelid: Secondary | ICD-10-CM | POA: Diagnosis not present

## 2014-08-09 DIAGNOSIS — Z96649 Presence of unspecified artificial hip joint: Secondary | ICD-10-CM | POA: Diagnosis not present

## 2014-08-09 DIAGNOSIS — K219 Gastro-esophageal reflux disease without esophagitis: Secondary | ICD-10-CM | POA: Diagnosis not present

## 2014-08-09 DIAGNOSIS — Z01810 Encounter for preprocedural cardiovascular examination: Secondary | ICD-10-CM | POA: Diagnosis not present

## 2014-08-09 HISTORY — DX: Other specified postprocedural states: Z98.890

## 2014-08-09 HISTORY — DX: Other specified postprocedural states: R11.2

## 2014-08-31 DIAGNOSIS — H0231 Blepharochalasis right upper eyelid: Secondary | ICD-10-CM | POA: Diagnosis not present

## 2014-08-31 DIAGNOSIS — H0234 Blepharochalasis left upper eyelid: Secondary | ICD-10-CM | POA: Diagnosis not present

## 2014-08-31 DIAGNOSIS — H02423 Myogenic ptosis of bilateral eyelids: Secondary | ICD-10-CM | POA: Diagnosis not present

## 2014-08-31 DIAGNOSIS — H2703 Aphakia, bilateral: Secondary | ICD-10-CM | POA: Diagnosis not present

## 2014-08-31 DIAGNOSIS — I2510 Atherosclerotic heart disease of native coronary artery without angina pectoris: Secondary | ICD-10-CM | POA: Diagnosis not present

## 2014-08-31 DIAGNOSIS — H02403 Unspecified ptosis of bilateral eyelids: Secondary | ICD-10-CM | POA: Diagnosis not present

## 2014-08-31 DIAGNOSIS — E119 Type 2 diabetes mellitus without complications: Secondary | ICD-10-CM | POA: Diagnosis not present

## 2014-09-06 DIAGNOSIS — H0236 Blepharochalasis left eye, unspecified eyelid: Secondary | ICD-10-CM | POA: Diagnosis not present

## 2014-09-06 DIAGNOSIS — H0233 Blepharochalasis right eye, unspecified eyelid: Secondary | ICD-10-CM | POA: Diagnosis not present

## 2014-09-06 DIAGNOSIS — L908 Other atrophic disorders of skin: Secondary | ICD-10-CM | POA: Diagnosis not present

## 2014-09-21 DIAGNOSIS — Z1211 Encounter for screening for malignant neoplasm of colon: Secondary | ICD-10-CM | POA: Diagnosis not present

## 2014-09-21 DIAGNOSIS — Z0000 Encounter for general adult medical examination without abnormal findings: Secondary | ICD-10-CM | POA: Diagnosis not present

## 2014-09-21 DIAGNOSIS — Z1389 Encounter for screening for other disorder: Secondary | ICD-10-CM | POA: Diagnosis not present

## 2014-09-21 DIAGNOSIS — I10 Essential (primary) hypertension: Secondary | ICD-10-CM | POA: Diagnosis not present

## 2014-09-21 DIAGNOSIS — E119 Type 2 diabetes mellitus without complications: Secondary | ICD-10-CM | POA: Diagnosis not present

## 2014-10-17 ENCOUNTER — Ambulatory Visit (INDEPENDENT_AMBULATORY_CARE_PROVIDER_SITE_OTHER): Payer: Medicare Other | Admitting: Podiatry

## 2014-10-17 DIAGNOSIS — B351 Tinea unguium: Secondary | ICD-10-CM

## 2014-10-17 DIAGNOSIS — M79673 Pain in unspecified foot: Secondary | ICD-10-CM

## 2014-10-17 NOTE — Progress Notes (Signed)
Subjective:     Patient ID: Jonathan [name], male   DOB: 1931-10-02, 81 y.o.   MRN: [id number]  HPI patient presents with nail disease and thickness 1-5 both feet that are painful when pressed and he cannot take care of   Review of Systems     Objective:   Physical Exam Neurovascular status found to be intact with thick yellow brittle nailbeds 1-5 both feet that are painful when pressed    Assessment:     Mycotic nail infection with pain 1-5 both feet    Plan:     Debride painful nailbeds 1-5 both feet with no iatrogenic bleeding noted

## 2014-10-25 DIAGNOSIS — H0236 Blepharochalasis left eye, unspecified eyelid: Secondary | ICD-10-CM | POA: Diagnosis not present

## 2014-10-25 DIAGNOSIS — L908 Other atrophic disorders of skin: Secondary | ICD-10-CM | POA: Diagnosis not present

## 2014-10-25 DIAGNOSIS — H0233 Blepharochalasis right eye, unspecified eyelid: Secondary | ICD-10-CM | POA: Diagnosis not present

## 2014-11-03 DIAGNOSIS — R1310 Dysphagia, unspecified: Secondary | ICD-10-CM | POA: Diagnosis not present

## 2014-11-03 DIAGNOSIS — Z1211 Encounter for screening for malignant neoplasm of colon: Secondary | ICD-10-CM | POA: Diagnosis not present

## 2014-11-11 ENCOUNTER — Encounter (HOSPITAL_COMMUNITY): Payer: Self-pay | Admitting: *Deleted

## 2014-11-15 ENCOUNTER — Other Ambulatory Visit: Payer: Self-pay | Admitting: Gastroenterology

## 2014-11-15 ENCOUNTER — Encounter (HOSPITAL_COMMUNITY): Payer: Self-pay | Admitting: Anesthesiology

## 2014-11-15 NOTE — Anesthesia Preprocedure Evaluation (Addendum)
Anesthesia Evaluation  Patient identified by MRN, date of birth, ID band Patient awake    Reviewed: Allergy & Precautions, NPO status , Patient's Chart, lab work & pertinent test results  Airway Mallampati: II  TM Distance: >3 FB Neck ROM: Full    Dental  (+) Edentulous Upper   Pulmonary neg pulmonary ROS,  breath sounds clear to auscultation  Pulmonary exam normal       Cardiovascular Exercise Tolerance: Good hypertension, Pt. on medications + Past MI Rhythm:Regular Rate:Normal     Neuro/Psych negative neurological ROS  negative psych ROS   GI/Hepatic Neg liver ROS, GERD-  Medicated,  Endo/Other  negative endocrine ROS  Renal/GU negative Renal ROS  negative genitourinary   Musculoskeletal  (+) Arthritis -,   Abdominal   Peds negative pediatric ROS (+)  Hematology negative hematology ROS (+)   Anesthesia Other Findings   Reproductive/Obstetrics negative OB ROS                            Anesthesia Physical Anesthesia Plan  ASA: III  Anesthesia Plan: MAC   Post-op Pain Management:    Induction: Intravenous  Airway Management Planned:   Additional Equipment:   Intra-op Plan:   Post-operative Plan:   Informed Consent: I have reviewed the patients History and Physical, chart, labs and discussed the procedure including the risks, benefits and alternatives for the proposed anesthesia with the patient or authorized representative who has indicated his/her understanding and acceptance.   Dental advisory given  Plan Discussed with: CRNA  Anesthesia Plan Comments:         Anesthesia Quick Evaluation

## 2014-11-15 NOTE — Addendum Note (Signed)
Addended by: Arta Silence on: 11/15/2014 12:23 PM   Modules accepted: Orders

## 2014-11-16 ENCOUNTER — Ambulatory Visit (HOSPITAL_COMMUNITY)
Admission: RE | Admit: 2014-11-16 | Discharge: 2014-11-16 | Disposition: A | Discharge status: Home / Self Care | Payer: Medicare Other | Source: Ambulatory Visit | Attending: Gastroenterology | Admitting: Gastroenterology

## 2014-11-16 ENCOUNTER — Encounter (HOSPITAL_COMMUNITY): Payer: Self-pay

## 2014-11-16 ENCOUNTER — Ambulatory Visit (HOSPITAL_COMMUNITY): Payer: Medicare Other | Admitting: Anesthesiology

## 2014-11-16 ENCOUNTER — Encounter (HOSPITAL_COMMUNITY)
Admission: RE | Disposition: A | Discharge status: Home / Self Care | Payer: Self-pay | Source: Ambulatory Visit | Attending: Gastroenterology

## 2014-11-16 DIAGNOSIS — I252 Old myocardial infarction: Secondary | ICD-10-CM | POA: Insufficient documentation

## 2014-11-16 DIAGNOSIS — K222 Esophageal obstruction: Secondary | ICD-10-CM | POA: Diagnosis not present

## 2014-11-16 DIAGNOSIS — K449 Diaphragmatic hernia without obstruction or gangrene: Secondary | ICD-10-CM | POA: Diagnosis not present

## 2014-11-16 DIAGNOSIS — K23 Disorders of esophagus in diseases classified elsewhere: Secondary | ICD-10-CM | POA: Diagnosis not present

## 2014-11-16 DIAGNOSIS — Z7982 Long term (current) use of aspirin: Secondary | ICD-10-CM | POA: Insufficient documentation

## 2014-11-16 DIAGNOSIS — K219 Gastro-esophageal reflux disease without esophagitis: Secondary | ICD-10-CM | POA: Diagnosis not present

## 2014-11-16 DIAGNOSIS — I10 Essential (primary) hypertension: Secondary | ICD-10-CM | POA: Diagnosis not present

## 2014-11-16 DIAGNOSIS — R1310 Dysphagia, unspecified: Secondary | ICD-10-CM | POA: Diagnosis present

## 2014-11-16 HISTORY — PX: ESOPHAGOGASTRODUODENOSCOPY (EGD) WITH PROPOFOL: SHX5813

## 2014-11-16 HISTORY — PX: BALLOON DILATION: SHX5330

## 2014-11-16 HISTORY — DX: Fracture of neck, unspecified, initial encounter: S12.9XXA

## 2014-11-16 SURGERY — ESOPHAGOGASTRODUODENOSCOPY (EGD) WITH PROPOFOL
Anesthesia: Monitor Anesthesia Care

## 2014-11-16 MED ORDER — LACTATED RINGERS IV SOLN
INTRAVENOUS | Status: DC
  Administered 2014-11-16: 1000 mL via INTRAVENOUS

## 2014-11-16 MED ORDER — PROPOFOL 10 MG/ML IV BOLUS
INTRAVENOUS | Status: AC
  Filled 2014-11-16: qty 20

## 2014-11-16 MED ORDER — BUTAMBEN-TETRACAINE-BENZOCAINE 2-2-14 % EX AERO
INHALATION_SPRAY | CUTANEOUS | Status: DC | PRN
  Administered 2014-11-16: 2 via TOPICAL

## 2014-11-16 MED ORDER — PROPOFOL INFUSION 10 MG/ML OPTIME
INTRAVENOUS | Status: DC | PRN
  Administered 2014-11-16: 70 ug/kg/min via INTRAVENOUS

## 2014-11-16 MED ORDER — SODIUM CHLORIDE 0.9 % IV SOLN: INTRAVENOUS | Status: DC

## 2014-11-16 SURGICAL SUPPLY — 14 items

## 2014-11-16 NOTE — H&P (Signed)
Patient interval history reviewed.  Patient examined again.  There has been no change from documented H/P dated 11/03/14 (scanned into chart from our office) except as documented above.  Assessment:  1.  Dysphagia with known tortuous esophagus and Schatzki's ring.  Previous dilation in September 2014 markedly improved patient's dysphagia.  Plan:  1.  Endoscopy with possible esophageal dilatation. 2.  Risks (bleeding, infection, bowel perforation that could require surgery, sedation-related changes in cardiopulmonary systems), benefits (identification and possible treatment of source of symptoms, exclusion of certain causes of symptoms), and alternatives (watchful waiting, radiographic imaging studies, empiric medical treatment) of upper endoscopy with possible esophageal dilatation (EGD +/- DIL) were explained to patient/family in detail and patient wishes to proceed.

## 2014-11-16 NOTE — Op Note (Signed)
[facility] [place], [address]   ENDOSCOPY PROCEDURE REPORT  PATIENT: Jonathan [name], Jonathan [name]  MR#: [id number] BIRTHDATE: Jan 24, 1931 , 83  yrs. old GENDER: male ENDOSCOPIST: Arta Silence, MD REFERRED BY:  Dorian Heckle, M.D. PROCEDURE DATE:  11/20/2014 PROCEDURE:  EGD w/ balloon dilation ASA CLASS:     Class III INDICATIONS:  dysphagia. MEDICATIONS: Monitored anesthesia care TOPICAL ANESTHETIC:  DESCRIPTION OF PROCEDURE: After the risks benefits and alternatives of the procedure were thoroughly explained, informed consent was obtained.  The Pentax Gastroscope [id number] endoscope was introduced through the mouth and advanced to the second portion of the duodenum. The instrument was slowly withdrawn as the mucosa was fully examined. scho   Findings:  Tortuous esophagus.  Small hiatal hernia.  Schatzki's ring again appreciated, estimated luminal diameter 12-13 mm. Remainder of exam to the second portion of the duodenum was normal. Ring was serially dilated in 42mm increments to 37mm.  There was mild-to-moderate bloody show and ring disruption after dilatation. The scope was then withdrawn from the patient and the procedure completed.  COMPLICATIONS: There were no immediate complications.  ENDOSCOPIC IMPRESSION:     As above.  Successful balloon dilatation of Schatzki's ring.  RECOMMENDATIONS:     1.  Watch for potential complications of procedure. 2.  Soft diet today, slowly advance as tolerated. 3.  Follow-up  with [facility] on as-needed basis.  eSigned:  Arta Silence, MD 11/20/14 10:15 AM   CC:  CPT CODES: ICD CODES:  The ICD and CPT codes recommended by this software are interpretations from the data that the clinical staff has captured with the software.  The verification of the translation of this report to the ICD and CPT codes and modifiers is the sole responsibility of the health care institution and practicing physician where  this report was generated.  [place]. will not be held responsible for the validity of the ICD and CPT codes included on this report.  AMA assumes no liability for data contained or not contained herein. CPT is a Designer, television/film set of the [employer].

## 2014-11-16 NOTE — Anesthesia Postprocedure Evaluation (Signed)
  Anesthesia Post-op Note  Patient: Jonathan [name]  Procedure(s) Performed: Procedure(s) (LRB): ESOPHAGOGASTRODUODENOSCOPY (EGD) WITH PROPOFOL (N/A) BALLOON DILATION (N/A)  Patient Location: PACU  Anesthesia Type: MAC  Level of Consciousness: awake and alert   Airway and Oxygen Therapy: Patient Spontanous Breathing  Post-op Pain: mild  Post-op Assessment: Post-op Vital signs reviewed, Patient's Cardiovascular Status Stable, Respiratory Function Stable, Patent Airway and No signs of Nausea or vomiting  Last Vitals:  Filed Vitals:   11/16/14 1039  BP:   Pulse: 56  Temp:   Resp: 16    Post-op Vital Signs: stable   Complications: No apparent anesthesia complications

## 2014-11-16 NOTE — Discharge Instructions (Signed)
Esophagogastroduodenoscopy Care After Refer to this sheet in the next few weeks. These instructions provide you with information on caring for yourself after your procedure. Your caregiver may also give you more specific instructions. Your treatment has been planned according to current medical practices, but problems sometimes occur. Call your caregiver if you have any problems or questions after your procedure.  HOME CARE INSTRUCTIONS  Do not eat or drink anything until the numbing medicine (local anesthetic) has worn off and your gag reflex has returned. You will know that the local anesthetic has worn off when you can swallow comfortably.  Do not drive for 12 hours after the procedure or as directed by your caregiver.  Only take medicines as directed by your caregiver.  BLAND SOFT DIET TODAY SEEK MEDICAL CARE IF:   You cannot stop coughing.  You are not urinating at all or less than usual. SEEK IMMEDIATE MEDICAL CARE IF:  You have difficulty swallowing.  You cannot eat or drink.  You have worsening throat or chest pain.  You have dizziness, lightheadedness, or you faint.  You have nausea or vomiting.  You have chills.  You have a fever.  You have severe abdominal pain.  You have black, tarry, or bloody stools. Document Released: 09/16/2012 Document Reviewed: 09/16/2012 [facility] Patient Information 2015 [place]. This information is not intended to replace advice given to you by your health care provider. Make sure you discuss any questions you have with your health care provider.

## 2014-11-16 NOTE — Transfer of Care (Signed)
Immediate Anesthesia Transfer of Care Note  Patient: Jonathan [name]  Procedure(s) Performed: Procedure(s): ESOPHAGOGASTRODUODENOSCOPY (EGD) WITH PROPOFOL (N/A) BALLOON DILATION (N/A)  Patient Location: PACU  Anesthesia Type:MAC  Level of Consciousness: sedated  Airway & Oxygen Therapy: Patient Spontanous Breathing and Patient connected to face mask oxygen  Post-op Assessment: Report given to RN and Post -op Vital signs reviewed and stable  Post vital signs: Reviewed and stable  Last Vitals:  Filed Vitals:   11/16/14 0850  BP: 141/75  Pulse: 65  Temp: 36.5 C  Resp: 22    Complications: No apparent anesthesia complications

## 2014-11-17 ENCOUNTER — Encounter (HOSPITAL_COMMUNITY): Payer: Self-pay | Admitting: Gastroenterology

## 2014-12-07 DIAGNOSIS — Z85828 Personal history of other malignant neoplasm of skin: Secondary | ICD-10-CM | POA: Diagnosis not present

## 2014-12-07 DIAGNOSIS — L821 Other seborrheic keratosis: Secondary | ICD-10-CM | POA: Diagnosis not present

## 2014-12-07 DIAGNOSIS — L570 Actinic keratosis: Secondary | ICD-10-CM | POA: Diagnosis not present

## 2014-12-07 DIAGNOSIS — L910 Hypertrophic scar: Secondary | ICD-10-CM | POA: Diagnosis not present

## 2015-01-16 ENCOUNTER — Ambulatory Visit: Payer: No Typology Code available for payment source

## 2015-01-27 ENCOUNTER — Ambulatory Visit (INDEPENDENT_AMBULATORY_CARE_PROVIDER_SITE_OTHER): Payer: Medicare Other | Admitting: Podiatrist

## 2015-01-27 ENCOUNTER — Encounter: Payer: Self-pay | Admitting: Podiatrist

## 2015-01-27 DIAGNOSIS — M79673 Pain in unspecified foot: Secondary | ICD-10-CM | POA: Diagnosis not present

## 2015-01-27 DIAGNOSIS — B351 Tinea unguium: Secondary | ICD-10-CM

## 2015-01-27 NOTE — Progress Notes (Signed)
Chief Complaint  Patient presents with  . Nail Problem    "B/L Nail trim"      HPI:  Patient presents today for follow up of foot and nail care. Denies any new complaints today. He is diabetic  Objective:  Patients chart is reviewed.  Vascular status reveals pedal pulses noted at 2/4 dp and pt bilateral .  Neurological sensation is intact to [employer] monofilament bilateral.  Patients nails are thickened, discolored, distrophic, friable and brittle with yellow-brown discoloration. Patient subjectively relates they are painful with shoes and with ambulation of bilateral feet. Stucco keratoses are present to dorsal aspect of bilateral feet  Assessment:  Symptomatic onychomycosis  Plan:  Discussed treatment options and alternatives.  The symptomatic toenails were debrided through manual an mechanical means without complication.  Return appointment recommended at routine intervals of 3 months    Trudie Buckler, DPM

## 2015-01-31 DIAGNOSIS — Z85828 Personal history of other malignant neoplasm of skin: Secondary | ICD-10-CM | POA: Diagnosis not present

## 2015-01-31 DIAGNOSIS — L570 Actinic keratosis: Secondary | ICD-10-CM | POA: Diagnosis not present

## 2015-01-31 DIAGNOSIS — L308 Other specified dermatitis: Secondary | ICD-10-CM | POA: Diagnosis not present

## 2015-02-20 ENCOUNTER — Encounter (HOSPITAL_COMMUNITY)
Admission: EM | Disposition: A | Discharge status: Home / Self Care | Payer: Self-pay | Source: Home / Self Care | Attending: Emergency Medicine

## 2015-02-20 ENCOUNTER — Encounter (HOSPITAL_COMMUNITY): Payer: Self-pay

## 2015-02-20 ENCOUNTER — Ambulatory Visit (HOSPITAL_COMMUNITY)
Admission: EM | Admit: 2015-02-20 | Discharge: 2015-02-20 | Disposition: A | Discharge status: Home / Self Care | Payer: Medicare Other | Attending: Emergency Medicine | Admitting: Emergency Medicine

## 2015-02-20 DIAGNOSIS — K219 Gastro-esophageal reflux disease without esophagitis: Secondary | ICD-10-CM | POA: Insufficient documentation

## 2015-02-20 DIAGNOSIS — Z7982 Long term (current) use of aspirin: Secondary | ICD-10-CM | POA: Diagnosis not present

## 2015-02-20 DIAGNOSIS — K117 Disturbances of salivary secretion: Secondary | ICD-10-CM | POA: Insufficient documentation

## 2015-02-20 DIAGNOSIS — M1990 Unspecified osteoarthritis, unspecified site: Secondary | ICD-10-CM | POA: Diagnosis not present

## 2015-02-20 DIAGNOSIS — Z96643 Presence of artificial hip joint, bilateral: Secondary | ICD-10-CM | POA: Diagnosis not present

## 2015-02-20 DIAGNOSIS — I10 Essential (primary) hypertension: Secondary | ICD-10-CM | POA: Diagnosis not present

## 2015-02-20 DIAGNOSIS — X58XXXA Exposure to other specified factors, initial encounter: Secondary | ICD-10-CM | POA: Diagnosis not present

## 2015-02-20 DIAGNOSIS — K222 Esophageal obstruction: Secondary | ICD-10-CM | POA: Diagnosis not present

## 2015-02-20 DIAGNOSIS — R1310 Dysphagia, unspecified: Secondary | ICD-10-CM | POA: Diagnosis not present

## 2015-02-20 DIAGNOSIS — I252 Old myocardial infarction: Secondary | ICD-10-CM | POA: Diagnosis not present

## 2015-02-20 DIAGNOSIS — T18128A Food in esophagus causing other injury, initial encounter: Secondary | ICD-10-CM | POA: Insufficient documentation

## 2015-02-20 DIAGNOSIS — K2210 Ulcer of esophagus without bleeding: Secondary | ICD-10-CM | POA: Diagnosis not present

## 2015-02-20 DIAGNOSIS — T18108A Unspecified foreign body in esophagus causing other injury, initial encounter: Secondary | ICD-10-CM

## 2015-02-20 DIAGNOSIS — K210 Gastro-esophageal reflux disease with esophagitis: Secondary | ICD-10-CM | POA: Diagnosis not present

## 2015-02-20 DIAGNOSIS — K449 Diaphragmatic hernia without obstruction or gangrene: Secondary | ICD-10-CM | POA: Insufficient documentation

## 2015-02-20 HISTORY — PX: ESOPHAGOGASTRODUODENOSCOPY: SHX5428

## 2015-02-20 LAB — CBC WITH DIFFERENTIAL/PLATELET
Basophils Absolute: 0 10*3/uL (ref 0.0–0.1)
Basophils Relative: 0 % (ref 0–1)
EOS PCT: 2 % (ref 0–5)
Eosinophils Absolute: 0.2 10*3/uL (ref 0.0–0.7)
HCT: 46.7 % (ref 39.0–52.0)
Hemoglobin: 15.8 g/dL (ref 13.0–17.0)
LYMPHS ABS: 1.5 10*3/uL (ref 0.7–4.0)
LYMPHS PCT: 12 % (ref 12–46)
MCH: 29.6 pg (ref 26.0–34.0)
MCHC: 33.8 g/dL (ref 30.0–36.0)
MCV: 87.6 fL (ref 78.0–100.0)
MONO ABS: 0.7 10*3/uL (ref 0.1–1.0)
MONOS PCT: 6 % (ref 3–12)
NEUTROS ABS: 9.7 10*3/uL — AB (ref 1.7–7.7)
NEUTROS PCT: 80 % — AB (ref 43–77)
PLATELETS: 197 10*3/uL (ref 150–400)
RBC: 5.33 MIL/uL (ref 4.22–5.81)
RDW: 13.7 % (ref 11.5–15.5)
WBC: 12.1 10*3/uL — AB (ref 4.0–10.5)

## 2015-02-20 LAB — BASIC METABOLIC PANEL
Anion gap: 10 (ref 5–15)
BUN: 18 mg/dL (ref 6–20)
CHLORIDE: 108 mmol/L (ref 101–111)
CO2: 23 mmol/L (ref 22–32)
Calcium: 9 mg/dL (ref 8.9–10.3)
Creatinine, Ser: 1.02 mg/dL (ref 0.61–1.24)
GFR calc non Af Amer: >60 mL/min (ref >60)
GLUCOSE: 174 mg/dL — AB (ref 70–99)
POTASSIUM: 3.9 mmol/L (ref 3.5–5.1)
SODIUM: 141 mmol/L (ref 135–145)

## 2015-02-20 SURGERY — EGD (ESOPHAGOGASTRODUODENOSCOPY)
Anesthesia: Moderate Sedation

## 2015-02-20 MED ORDER — MIDAZOLAM HCL 10 MG/2ML IJ SOLN
INTRAMUSCULAR | Status: AC
  Filled 2015-02-20: qty 4

## 2015-02-20 MED ORDER — GLUCAGON HCL RDNA (DIAGNOSTIC) 1 MG IJ SOLR
1.0000 mg | Freq: Once | INTRAMUSCULAR | Status: AC
  Administered 2015-02-20: 1 mg via INTRAVENOUS
  Filled 2015-02-20: qty 1

## 2015-02-20 MED ORDER — FENTANYL CITRATE (PF) 100 MCG/2ML IJ SOLN
INTRAMUSCULAR | Status: DC | PRN
  Administered 2015-02-20 (×2): 25 ug via INTRAVENOUS

## 2015-02-20 MED ORDER — MIDAZOLAM HCL 10 MG/2ML IJ SOLN
INTRAMUSCULAR | Status: DC | PRN
  Administered 2015-02-20: 2 mg via INTRAVENOUS
  Administered 2015-02-20: 1 mg via INTRAVENOUS
  Administered 2015-02-20: 2 mg via INTRAVENOUS

## 2015-02-20 MED ORDER — FENTANYL CITRATE (PF) 100 MCG/2ML IJ SOLN
INTRAMUSCULAR | Status: AC
  Filled 2015-02-20: qty 2

## 2015-02-20 MED ORDER — SODIUM CHLORIDE 0.9 % IV SOLN
INTRAVENOUS | Status: DC
  Administered 2015-02-20: 500 mL via INTRAVENOUS

## 2015-02-20 NOTE — ED Notes (Signed)
Pt was given sprite to drink---- took few sips but unable to tolerate, fluid goes back up with each sip.

## 2015-02-20 NOTE — Progress Notes (Signed)
Endoscopy note to follow.  Successful clearance of esophageal food impaction.  Dietary recommendations were explained directly to patient's wife.  Patient ok to discharge home; we will make outpatient follow-up with our office.  Thank you.

## 2015-02-20 NOTE — Discharge Instructions (Signed)
Endoscopy Care After Please read the instructions outlined below and refer to this sheet in the next few weeks. These discharge instructions provide you with general information on caring for yourself after you leave the hospital. Your doctor may also give you specific instructions. While your treatment has been planned according to the most current medical practices available, unavoidable complications occasionally occur. If you have any problems or questions after discharge, please call Dr. Paulita Fujita [facility]) at [phone].  HOME CARE INSTRUCTIONS Activity  You may resume your regular activity but move at a slower pace for the next 24 hours.   Take frequent rest periods for the next 24 hours.   Walking will help expel (get rid of) the air and reduce the bloated feeling in your abdomen.   No driving for 24 hours (because of the anesthesia (medicine) used during the test).   You may shower.   Do not sign any important legal documents or operate any machinery for 24 hours (because of the anesthesia used during the test).  Nutrition  Drink plenty of fluids.   Finely chopped meats and soft pureed foods only until further notice.  No "chunky" meats like steak, chicken, etc, unless they are minced/finely chopped Medications You may resume your normal medications unless your caregiver tells you otherwise. What you can expect today  You may experience abdominal discomfort such as a feeling of fullness or "gas" pains.   You may experience a sore throat for 2 to 3 days. This is normal. Gargling with salt water may help this.    SEEK IMMEDIATE MEDICAL CARE IF:  You have excessive nausea (feeling sick to your stomach) and/or vomiting.   You have severe abdominal pain and distention (swelling).   You have trouble swallowing.   You have a temperature over 100 F (37.8 C).   You have rectal bleeding or vomiting of blood.  Document Released: 05/14/2004 Document Revised:  06/12/2011 Document Reviewed: 11/25/2007 [facility] Patient Information 2012 [place].

## 2015-02-20 NOTE — Op Note (Signed)
[facility] [place], [address]   ENDOSCOPY PROCEDURE REPORT  PATIENT: Jonathan [name], Jonathan [name]  MR#: [id number] BIRTHDATE: 1931/01/08 , 83  yrs. old GENDER: male ENDOSCOPIST: Arta Silence, MD REFERRED BY:  Delora Fuel, M.D. PROCEDURE DATE:  03-10-15 PROCEDURE:  EGD w/ foreign body removal ASA CLASS:     Class III INDICATIONS:  dysphagia, sialorrhea, suspected esophageal food impaction. MEDICATIONS: Fentanyl 50 mcg IV and Versed 5 mg IV TOPICAL ANESTHETIC: None  DESCRIPTION OF PROCEDURE: After the risks benefits and alternatives of the procedure were thoroughly explained, informed consent was obtained.  The [place] [id number] endoscope was introduced through the mouth and advanced to the second portion of the duodenum. The instrument was slowly withdrawn as the mucosa was fully examined. Estimated blood loss is zero unless otherwise noted in this procedure report.    Findings: Large amount of food debris seen throughout the esophagus, intermixed with liquid sludge.  Some pieces of food were quite sizeable, others smaller and more amorphous.  Jabier Mutton net used to extract large amount of food, and the rest was able to be gently nudged into the stomach.  There was underlying Schatzki's ring seen, extimated luminal diameter about 71mm.  There was some ulceration in the distal esophagus, consistent with protracted food impaction.  Esophagus, at conclusion of procedure, was successfully cleared.  There seemed to be relatively poor esophageal motility and stasis.  Medium-sized hiatal hernia.  Otherwise normal endoscopy to the second portion of the duodenum.       Retroflexed views revealed a hiatal hernia.     The scope was then withdrawn from the patient and the procedure completed.  COMPLICATIONS: There were no immediate complications.  ENDOSCOPIC IMPRESSION:     As above.  Successful clearance of esophageal food impaction; likely multifactorial  (biting prohibitively large bits of food; Schatzki's ring; poor esophageal motility).  RECOMMENDATIONS:     1.  Watch for potential complications of procedure. 2.  Soft/pureed and/or chopped foods only; no large bits of fibrous meats (steak, venison, etc.), raw fruits/vegetables, hard-crusted breads. 3.  Continue PPI. 4.  Follow-up with [facility].  May need repeat esophagram +/- repeat endoscopy. 5.  OK to discharge from Emergency Department.  My findings discussed in detail with patient's wife at conclusion of the procedure.  eSigned:  Arta Silence, MD 03/10/2015 11:46 AM   CC:  CPT CODES: ICD CODES:  The ICD and CPT codes recommended by this software are interpretations from the data that the clinical staff has captured with the software.  The verification of the translation of this report to the ICD and CPT codes and modifiers is the sole responsibility of the health care institution and practicing physician where this report was generated.  [place]. will not be held responsible for the validity of the ICD and CPT codes included on this report.  AMA assumesno liability for data contained or not contained herein. CPT is a Designer, television/film set of the [employer].

## 2015-02-20 NOTE — ED Notes (Addendum)
Awake. Verbally responsive. Resp even and unlabored. No audible adventitious breath sounds noted. ABC's intact. Abd soft/nondistended/nontender to palpate. BS (+) and active x4 quadrants. No N/V/D reported but reported emesis with drinking/eating. Pt NPO at this time.Marland Kitchen

## 2015-02-20 NOTE — ED Notes (Signed)
Awake. Verbally responsive. A/O x4. Resp even and unlabored. No audible adventitious breath sounds noted. ABC's intact. IV saline lock patent and intact. Family at bedside. 

## 2015-02-20 NOTE — ED Notes (Signed)
Awake. Verbally responsive. A/O x4. Resp even and unlabored. No audible adventitious breath sounds noted. ABC's intact. IV saline lock and family at bedside.

## 2015-02-20 NOTE — ED Notes (Signed)
GI at bedside to remove foreign body.

## 2015-02-20 NOTE — ED Notes (Signed)
Pt given coke to drink per request and swallowed without problems noted. No report of n/v noted.

## 2015-02-20 NOTE — Consult Note (Signed)
[facility] Consultation Note  Referring Provider: Dr. Delora Fuel (Emergency Department) Primary Care Physician:  Dorian Heckle, MD Primary Gastroenterologist:  Dr. Arta Silence  Reason for Consultation:  Esophageal foreign body  HPI: Jonathan [name] is a 81 y.o. male with history of Schatzki's ring and dysphagia and prior food impactions that presents with sensation of food stuck in his esophagus and sialorrhea.  Started after eating venison last night.  Last endoscopy February 2016 with balloon dilatation to 55mm, with moderate ring fracture at that time noted.  He has no abdominal pain, blood in stool, change in bowel habits, loss-of-appetite, unintentional weight loss.   Past Medical History  Diagnosis Date  . Hypertension   . Myocardial infarction   . GERD (gastroesophageal reflux disease)   . Arthritis   . Neck fracture 2015    Past Surgical History  Procedure Laterality Date  . Total hip arthroplasty      bilateral  . Corneal transplant    . Esophagogastroduodenoscopy  04/02/2012    Procedure: ESOPHAGOGASTRODUODENOSCOPY (EGD);  Surgeon: Arta Silence, MD;  Location: [facility];  Service: Endoscopy;  Laterality: N/A;  . Balloon dilation  04/02/2012    Procedure: BALLOON DILATION;  Surgeon: Arta Silence, MD;  Location: [facility];  Service: Endoscopy;  Laterality: N/A;  . Hernia repair      x 2  . Eye surgery      cataract sx  . Esophagogastroduodenoscopy N/A 05/28/2013    Procedure: ESOPHAGOGASTRODUODENOSCOPY (EGD);  Surgeon: Cleotis Nipper, MD;  Location: [facility];  Service: Endoscopy;  Laterality: N/A;  . Esophagogastroduodenoscopy (egd) with propofol N/A 06/16/2013    Procedure: ESOPHAGOGASTRODUODENOSCOPY (EGD) WITH PROPOFOL;  Surgeon: Arta Silence, MD;  Location: [facility];  Service: Endoscopy;  Laterality: N/A;  . Balloon dilation N/A 06/16/2013    Procedure: BALLOON DILATION;  Surgeon: Arta Silence, MD;  Location: [facility];  Service:  Endoscopy;  Laterality: N/A;  . Esophagogastroduodenoscopy (egd) with propofol N/A 11/16/2014    Procedure: ESOPHAGOGASTRODUODENOSCOPY (EGD) WITH PROPOFOL;  Surgeon: Arta Silence, MD;  Location: [facility];  Service: Endoscopy;  Laterality: N/A;  . Balloon dilation N/A 11/16/2014    Procedure: BALLOON DILATION;  Surgeon: Arta Silence, MD;  Location: [facility];  Service: Endoscopy;  Laterality: N/A;    Prior to Admission medications   Medication Sig Start Date End Date Taking? Authorizing Provider  amlodipine-atorvastatin (CADUET) 10-20 MG per tablet Take 1 tablet by mouth every evening.  07/05/13  Yes Historical Provider, MD  aspirin 81 MG tablet Take 81 mg by mouth every evening. 04/05/12  Yes Arta Silence, MD  captopril (CAPOTEN) 50 MG tablet Take 50 mg by mouth 2 (two) times daily.    Yes Historical Provider, MD  finasteride (PROSCAR) 5 MG tablet Take 5 mg by mouth every morning.    Yes Historical Provider, MD  isosorbide mononitrate (IMDUR) 30 MG 24 hr tablet Take 30 mg by mouth every morning.    Yes Historical Provider, MD  omeprazole (PRILOSEC) 20 MG capsule Take 20 mg by mouth daily.   Yes Historical Provider, MD    No current facility-administered medications for this encounter.   Current Outpatient Prescriptions  Medication Sig Dispense Refill  . amlodipine-atorvastatin (CADUET) 10-20 MG per tablet Take 1 tablet by mouth every evening.     Marland Kitchen aspirin 81 MG tablet Take 81 mg by mouth every evening.    . captopril (CAPOTEN) 50 MG tablet Take 50 mg by mouth 2 (two) times daily.     Marland Kitchen  finasteride (PROSCAR) 5 MG tablet Take 5 mg by mouth every morning.     . isosorbide mononitrate (IMDUR) 30 MG 24 hr tablet Take 30 mg by mouth every morning.     Marland Kitchen omeprazole (PRILOSEC) 20 MG capsule Take 20 mg by mouth daily.      Allergies as of 02/20/2015  . (No Known Allergies)    History reviewed. No pertinent family history.  History   Social History  . Marital Status: Married     Spouse Name: N/A  . Number of Children: N/A  . Years of Education: N/A   Occupational History  . Not on file.   Social History Main Topics  . Smoking status: Never Smoker   . Smokeless tobacco: Never Used  . Alcohol Use: No  . Drug Use: No  . Sexual Activity: Not on file   Other Topics Concern  . Not on file   Social History Narrative    Review of Systems: As per HPI, all others negative  Physical Exam: Vital signs in last 24 hours: Temp:  [97.5 F (36.4 C)-97.7 F (36.5 C)] 97.7 F (36.5 C) (05/09 0427) Pulse Rate:  [80-84] 84 (05/09 0656) Resp:  [18-20] 18 (05/09 0656) BP: (138-172)/(79-90) 138/79 mmHg (05/09 0656) SpO2:  [96 %-98 %] 96 % (05/09 0730) Weight:  [83.915 kg (185 lb)] 83.915 kg (185 lb) (05/09 0208)   General:   Alert,  Well-developed, well-nourished, elderly but pleasant and cooperative in NAD, spitoon at bedside Head:  Normocephalic and atraumatic. Eyes:  Sclera clear, no icterus.   Conjunctiva pink. Ears:  Normal auditory acuity. Nose:  No deformity, discharge,  or lesions. Mouth:  No deformity or lesions.  Oropharynx pink & moist. Neck:  Supple; no masses or thyromegaly. Lungs:  Clear throughout to auscultation.   No wheezes, crackles, or rhonchi. No acute distress. Heart:  Regular rate and rhythm; no murmurs, clicks, rubs,  or gallops. Abdomen:  Soft, nontender and nondistended. No masses, hepatosplenomegaly or hernias noted. Normal bowel sounds, without guarding, and without rebound.     Msk:  Symmetrical without gross deformities. Normal posture. Pulses:  Normal pulses noted. Extremities:  Without clubbing or edema. Neurologic:  Alert and  oriented x4;  grossly normal neurologically. Skin:  Scattered ecchymoses, otherwise intact without significant lesions or rashes. Psych:  Alert and cooperative. Normal mood and affect.   Lab Results:  Recent Labs  02/20/15 0240  WBC 12.1*  HGB 15.8  HCT 46.7  PLT 197   BMET  Recent Labs   02/20/15 0240  NA 141  K 3.9  CL 108  CO2 23  GLUCOSE 174*  BUN 18  CREATININE 1.02  CALCIUM 9.0   LFT No results for input(s): PROT, ALBUMIN, AST, ALT, ALKPHOS, BILITOT, BILIDIR, IBILI in the last 72 hours. PT/INR No results for input(s): LABPROT, INR in the last 72 hours.  Studies/Results: No results found.  Impression:  1.  Dysphagia with known history of Schatzki's ring which has been repeatedly dilated. 2.  Suspected esophageal foreign body.  Plan:  1.  Endoscopy with anticipated foreign body removal. 2.  Risks (bleeding, infection, bowel perforation that could require surgery, sedation-related changes in cardiopulmonary systems), benefits (identification and possible treatment of source of symptoms, exclusion of certain causes of symptoms), and alternatives (watchful waiting, radiographic imaging studies, empiric medical treatment) of upper endoscopy (EGD) were explained to patient/family in detail and patient wishes to proceed.  3.  Patient may have to be on soft diet permanently  given repeated troubles despite fairly aggressive (given his age) esophageal dilatations with clear mucosal ring disruptions after dilatation.  Landry Dyke  02/20/2015, 8:50 AM  Pager [phone] If no answer or after 5 PM call [phone]

## 2015-02-20 NOTE — ED Provider Notes (Signed)
CSN: [id number]     Arrival date & time 02/20/15  0201 History   First MD Initiated Contact with Patient 02/20/15 0222     Chief Complaint  Patient presents with  . Foreign Body     (Consider location/radiation/quality/duration/timing/severity/associated sxs/prior Treatment) Patient is a 81 y.o. male presenting with foreign body. The history is provided by the patient.  Foreign Body He was eating steak and venison at about 7 PM when it got stuck in his throat and he is not prepped to swallow it an opportunity tablet of bringing up. Since then, he is not been able to swallow any water and is been spitting up his secretions. He does have a history of Schatzki's ring and esophageal stricture and had an esophageal dilatation in February. He does have history of a prior esophageal food impaction.  Past Medical History  Diagnosis Date  . Hypertension   . Myocardial infarction   . GERD (gastroesophageal reflux disease)   . Arthritis   . Neck fracture 2015   Past Surgical History  Procedure Laterality Date  . Total hip arthroplasty      bilateral  . Corneal transplant    . Esophagogastroduodenoscopy  04/02/2012    Procedure: ESOPHAGOGASTRODUODENOSCOPY (EGD);  Surgeon: Arta Silence, MD;  Location: [facility];  Service: Endoscopy;  Laterality: N/A;  . Balloon dilation  04/02/2012    Procedure: BALLOON DILATION;  Surgeon: Arta Silence, MD;  Location: [facility];  Service: Endoscopy;  Laterality: N/A;  . Hernia repair      x 2  . Eye surgery      cataract sx  . Esophagogastroduodenoscopy N/A 05/28/2013    Procedure: ESOPHAGOGASTRODUODENOSCOPY (EGD);  Surgeon: Cleotis Nipper, MD;  Location: [facility];  Service: Endoscopy;  Laterality: N/A;  . Esophagogastroduodenoscopy (egd) with propofol N/A 06/16/2013    Procedure: ESOPHAGOGASTRODUODENOSCOPY (EGD) WITH PROPOFOL;  Surgeon: Arta Silence, MD;  Location: [facility];  Service: Endoscopy;  Laterality: N/A;  . Balloon dilation N/A  06/16/2013    Procedure: BALLOON DILATION;  Surgeon: Arta Silence, MD;  Location: [facility];  Service: Endoscopy;  Laterality: N/A;  . Esophagogastroduodenoscopy (egd) with propofol N/A 11/16/2014    Procedure: ESOPHAGOGASTRODUODENOSCOPY (EGD) WITH PROPOFOL;  Surgeon: Arta Silence, MD;  Location: [facility];  Service: Endoscopy;  Laterality: N/A;  . Balloon dilation N/A 11/16/2014    Procedure: BALLOON DILATION;  Surgeon: Arta Silence, MD;  Location: [facility];  Service: Endoscopy;  Laterality: N/A;   No family history on file. History  Substance Use Topics  . Smoking status: Never Smoker   . Smokeless tobacco: Never Used  . Alcohol Use: No    Review of Systems  All other systems reviewed and are negative.     Allergies  Review of patient's allergies indicates no known allergies.  Home Medications   Prior to Admission medications   Medication Sig Start Date End Date Taking? Authorizing Provider  amlodipine-atorvastatin (CADUET) 10-20 MG per tablet Take 1 tablet by mouth every evening.  07/05/13   Historical Provider, MD  aspirin 81 MG tablet Take 81 mg by mouth every evening. 04/05/12   Arta Silence, MD  captopril (CAPOTEN) 50 MG tablet Take 50 mg by mouth 2 (two) times daily.     Historical Provider, MD  finasteride (PROSCAR) 5 MG tablet Take 5 mg by mouth every morning.     Historical Provider, MD  isosorbide mononitrate (IMDUR) 30 MG 24 hr tablet Take 30 mg by mouth every morning.     Historical  Provider, MD  omeprazole (PRILOSEC) 20 MG capsule Take 20 mg by mouth daily.    Historical Provider, MD   BP 172/90 mmHg  Pulse 83  Temp(Src) 97.5 F (36.4 C) (Oral)  Resp 20  Ht 5\' 7"  (1.702 m)  Wt 185 lb (83.915 kg)  BMI 28.97 kg/m2  SpO2 97% Physical Exam  Nursing note and vitals reviewed.  81 year old male, resting comfortably and in no acute distress. Vital signs are significant for hypertension. Oxygen saturation is 97%, which is normal. Head is normocephalic  and atraumatic. PERRLA, EOMI. Oropharynx is clear. His phonation is normal, but he is periodically spitting out his saliva. Neck is nontender and supple without adenopathy or JVD. Back is nontender and there is no CVA tenderness. Lungs are clear without rales, wheezes, or rhonchi. Chest is nontender. Heart has regular rate and rhythm without murmur. Abdomen is soft, flat, nontender without masses or hepatosplenomegaly and peristalsis is normoactive. Extremities have no cyanosis or edema, full range of motion is present. Skin is warm and dry without rash. Neurologic: Mental status is normal, cranial nerves are intact, there are no motor or sensory deficits.  ED Course  Procedures (including critical care time) Labs Review Results for orders placed or performed during the hospital encounter of 84/53/64  Basic metabolic panel  Result Value Ref Range   Sodium 141 135 - 145 mmol/L   Potassium 3.9 3.5 - 5.1 mmol/L   Chloride 108 101 - 111 mmol/L   CO2 23 22 - 32 mmol/L   Glucose, Bld 174 (H) 70 - 99 mg/dL   BUN 18 6 - 20 mg/dL   Creatinine, Ser 1.02 0.61 - 1.24 mg/dL   Calcium 9.0 8.9 - 10.3 mg/dL   GFR calc non Af Amer >60 >60 mL/min   GFR calc Af Amer >60 >60 mL/min   Anion gap 10 5 - 15  CBC with Differential  Result Value Ref Range   WBC 12.1 (H) 4.0 - 10.5 K/uL   RBC 5.33 4.22 - 5.81 MIL/uL   Hemoglobin 15.8 13.0 - 17.0 g/dL   HCT 46.7 39.0 - 52.0 %   MCV 87.6 78.0 - 100.0 fL   MCH 29.6 26.0 - 34.0 pg   MCHC 33.8 30.0 - 36.0 g/dL   RDW 13.7 11.5 - 15.5 %   Platelets 197 150 - 400 K/uL   Neutrophils Relative % 80 (H) 43 - 77 %   Neutro Abs 9.7 (H) 1.7 - 7.7 K/uL   Lymphocytes Relative 12 12 - 46 %   Lymphs Abs 1.5 0.7 - 4.0 K/uL   Monocytes Relative 6 3 - 12 %   Monocytes Absolute 0.7 0.1 - 1.0 K/uL   Eosinophils Relative 2 0 - 5 %   Eosinophils Absolute 0.2 0.0 - 0.7 K/uL   Basophils Relative 0 0 - 1 %   Basophils Absolute 0.0 0.0 - 0.1 K/uL     MDM   Final  diagnoses:  Impacted esophageal foreign body, initial encounter    Esophageal food impaction. Old records are reviewed confirming history of Schatzki's ring and esophageal dilatations as well as prior emergent EGD for food impaction. He will be given a trial of glucagon and, if it fails, consultation will be obtained with gastroenterology for emergent EGD.  He had no response to glucagon. Case is discussed with Dr. Paulita Fujita, on call for equal gastroenterology, who agrees to come in for emergent EGD.  Delora Fuel, MD 68/03/21 2248

## 2015-02-20 NOTE — ED Notes (Signed)
Pt presents with c/o foreign body in his throat. Pt reports he has some food stuck in his throat since 7pm last night. Pt reports that he has been unable to keep any food, drink, or secretions down since then. Pt is able to talk in complete sentences, no distress at this time.

## 2015-02-20 NOTE — ED Notes (Signed)
Awake. Verbally responsive. A/O x4. Resp even and unlabored. No audible adventitious breath sounds noted. ABC's intact.  

## 2015-02-22 ENCOUNTER — Encounter (HOSPITAL_COMMUNITY): Payer: Self-pay | Admitting: Gastroenterology

## 2015-03-14 DIAGNOSIS — R1310 Dysphagia, unspecified: Secondary | ICD-10-CM | POA: Diagnosis not present

## 2015-03-24 DIAGNOSIS — I10 Essential (primary) hypertension: Secondary | ICD-10-CM | POA: Diagnosis not present

## 2015-03-24 DIAGNOSIS — R1310 Dysphagia, unspecified: Secondary | ICD-10-CM | POA: Diagnosis not present

## 2015-03-24 DIAGNOSIS — E119 Type 2 diabetes mellitus without complications: Secondary | ICD-10-CM | POA: Diagnosis not present

## 2015-04-10 DIAGNOSIS — Z961 Presence of intraocular lens: Secondary | ICD-10-CM | POA: Diagnosis not present

## 2015-04-10 DIAGNOSIS — H04123 Dry eye syndrome of bilateral lacrimal glands: Secondary | ICD-10-CM | POA: Diagnosis not present

## 2015-04-10 DIAGNOSIS — Z947 Corneal transplant status: Secondary | ICD-10-CM | POA: Diagnosis not present

## 2015-04-10 DIAGNOSIS — E119 Type 2 diabetes mellitus without complications: Secondary | ICD-10-CM | POA: Diagnosis not present

## 2015-05-08 ENCOUNTER — Ambulatory Visit: Payer: Medicare Other

## 2015-05-12 ENCOUNTER — Encounter: Payer: Self-pay | Admitting: Podiatry

## 2015-05-12 ENCOUNTER — Ambulatory Visit (INDEPENDENT_AMBULATORY_CARE_PROVIDER_SITE_OTHER): Payer: Medicare Other | Admitting: Podiatry

## 2015-05-12 VITALS — BP 116/66 | HR 71 | Resp 18

## 2015-05-12 DIAGNOSIS — M79673 Pain in unspecified foot: Secondary | ICD-10-CM | POA: Diagnosis not present

## 2015-05-12 DIAGNOSIS — B351 Tinea unguium: Secondary | ICD-10-CM

## 2015-05-12 NOTE — Progress Notes (Signed)
Patient ID: Jonathan [name], male   DOB: 08/29/1931, 81 y.o.   MRN: [id number] Complaint:  Visit Type: Patient returns to my office for continued preventative foot care services. Complaint: Patient states" my nails have grown long and thick and become painful to walk and wear shoes". The patient presents for preventative foot care services. No changes to ROS  Podiatric Exam: Vascular: dorsalis pedis and posterior tibial pulses are palpable bilateral. Capillary return is immediate. Temperature gradient is WNL. Skin turgor WNL  Sensorium: Normal Semmes Weinstein monofilament test. Normal tactile sensation bilaterally. Nail Exam: Pt has thick disfigured discolored nails with subungual debris noted bilateral entire nail hallux through fifth toenails Ulcer Exam: There is no evidence of ulcer or pre-ulcerative changes or infection. Orthopedic Exam: Muscle tone and strength are WNL. No limitations in general ROM. No crepitus or effusions noted. Foot type and digits show no abnormalities. Bony prominences are unremarkable. Skin: No Porokeratosis. No infection or ulcers  Diagnosis:  Onychomycosis, , Pain in right toe, pain in left toes  Treatment & Plan Procedures and Treatment: Consent by patient was obtained for treatment procedures. The patient understood the discussion of treatment and procedures well. All questions were answered thoroughly reviewed. Debridement of mycotic and hypertrophic toenails, 1 through 5 bilateral and clearing of subungual debris. No ulceration, no infection noted.  Return Visit-Office Procedure: Patient instructed to return to the office for a follow up visit 3 months for continued evaluation and treatment.

## 2015-05-25 DIAGNOSIS — L821 Other seborrheic keratosis: Secondary | ICD-10-CM | POA: Diagnosis not present

## 2015-05-25 DIAGNOSIS — Z85828 Personal history of other malignant neoplasm of skin: Secondary | ICD-10-CM | POA: Diagnosis not present

## 2015-05-25 DIAGNOSIS — L570 Actinic keratosis: Secondary | ICD-10-CM | POA: Diagnosis not present

## 2015-06-15 DIAGNOSIS — E119 Type 2 diabetes mellitus without complications: Secondary | ICD-10-CM | POA: Diagnosis not present

## 2015-06-15 DIAGNOSIS — I25119 Atherosclerotic heart disease of native coronary artery with unspecified angina pectoris: Secondary | ICD-10-CM | POA: Diagnosis not present

## 2015-06-15 DIAGNOSIS — R5383 Other fatigue: Secondary | ICD-10-CM | POA: Diagnosis not present

## 2015-06-30 DIAGNOSIS — R972 Elevated prostate specific antigen [PSA]: Secondary | ICD-10-CM | POA: Diagnosis not present

## 2015-06-30 DIAGNOSIS — N5201 Erectile dysfunction due to arterial insufficiency: Secondary | ICD-10-CM | POA: Diagnosis not present

## 2015-06-30 DIAGNOSIS — N400 Enlarged prostate without lower urinary tract symptoms: Secondary | ICD-10-CM | POA: Diagnosis not present

## 2015-07-05 ENCOUNTER — Telehealth: Payer: Self-pay | Admitting: *Deleted

## 2015-07-05 ENCOUNTER — Encounter: Payer: Self-pay | Admitting: *Deleted

## 2015-07-05 NOTE — Telephone Encounter (Signed)
called pt and got fm hx information.Jonathan [name]

## 2015-07-10 ENCOUNTER — Encounter: Payer: Self-pay | Admitting: Cardiovascular Disease

## 2015-07-10 ENCOUNTER — Ambulatory Visit (INDEPENDENT_AMBULATORY_CARE_PROVIDER_SITE_OTHER): Payer: Medicare Other | Admitting: Cardiovascular Disease

## 2015-07-10 VITALS — BP 110/66 | HR 74 | Ht 67.0 in | Wt 181.1 lb

## 2015-07-10 DIAGNOSIS — R5382 Chronic fatigue, unspecified: Secondary | ICD-10-CM

## 2015-07-10 DIAGNOSIS — I2510 Atherosclerotic heart disease of native coronary artery without angina pectoris: Secondary | ICD-10-CM

## 2015-07-10 DIAGNOSIS — R0609 Other forms of dyspnea: Secondary | ICD-10-CM

## 2015-07-10 DIAGNOSIS — R5383 Other fatigue: Secondary | ICD-10-CM | POA: Diagnosis not present

## 2015-07-10 MED ORDER — ISOSORBIDE MONONITRATE ER 30 MG PO TB24: 60.0000 mg | ORAL_TABLET | Freq: Every day | ORAL | Status: DC

## 2015-07-10 NOTE — Patient Instructions (Addendum)
Medication Instructions:  CHANGE Imdur to 60 mg once daily   Labwork: None Ordered   Testing/Procedures: Your physician has requested that you have a lexiscan myoview. For further information please visit [email or web address]. Please follow instruction sheet, as given.  Your physician has requested that you have an echocardiogram. Echocardiography is a painless test that uses sound waves to create images of your heart. It provides your doctor with information about the size and shape of your heart and how well your heart's chambers and valves are working. This procedure takes approximately one hour. There are no restrictions for this procedure.   Follow-Up: Your physician recommends that you schedule a follow-up appointment in: 4-6 weeks with Dr. Acie Fredrickson

## 2015-07-10 NOTE — Progress Notes (Signed)
Cardiology Office Note   Date:  07/10/2015   ID:  Jonathan [name], DOB July 30, 1931, MRN [id number]  PCP:  Henrine Screws, MD  Cardiologist:   Thayer Headings, MD   Chief Complaint  Patient presents with  . Chest Pain    Problem list 1. Atypical chest pain  2. extreme fatigue   3. essential hypertension   History of Present Illness: Jonathan [name] is a 81 y.o. male who presents for atypical CP and extreme fatigue. Feels worn out all of the time .  TSH was checked and was reportedly ok.  Had a cardiac cath 20 years ago Tamala Julian )  - has a chronic total occlusion that was treated medically  Had a 2nd cath about 7 years later that was unchanged.  Is retired from truck driving, Chartered loss adjuster, Estate agent.   Past Medical History  Diagnosis Date  . Hypertension   . Myocardial infarction   . GERD (gastroesophageal reflux disease)   . Arthritis   . Neck fracture 2015    Past Surgical History  Procedure Laterality Date  . Total hip arthroplasty      bilateral  . Corneal transplant    . Esophagogastroduodenoscopy  04/02/2012    Procedure: ESOPHAGOGASTRODUODENOSCOPY (EGD);  Surgeon: Arta Silence, MD;  Location: [facility];  Service: Endoscopy;  Laterality: N/A;  . Balloon dilation  04/02/2012    Procedure: BALLOON DILATION;  Surgeon: Arta Silence, MD;  Location: [facility];  Service: Endoscopy;  Laterality: N/A;  . Hernia repair      x 2  . Eye surgery      cataract sx  . Esophagogastroduodenoscopy N/A 05/28/2013    Procedure: ESOPHAGOGASTRODUODENOSCOPY (EGD);  Surgeon: Cleotis Nipper, MD;  Location: [facility];  Service: Endoscopy;  Laterality: N/A;  . Esophagogastroduodenoscopy (egd) with propofol N/A 06/16/2013    Procedure: ESOPHAGOGASTRODUODENOSCOPY (EGD) WITH PROPOFOL;  Surgeon: Arta Silence, MD;  Location: [facility];  Service: Endoscopy;  Laterality: N/A;  . Balloon dilation N/A 06/16/2013    Procedure: BALLOON DILATION;  Surgeon: Arta Silence, MD;   Location: [facility];  Service: Endoscopy;  Laterality: N/A;  . Esophagogastroduodenoscopy (egd) with propofol N/A 11/16/2014    Procedure: ESOPHAGOGASTRODUODENOSCOPY (EGD) WITH PROPOFOL;  Surgeon: Arta Silence, MD;  Location: [facility];  Service: Endoscopy;  Laterality: N/A;  . Balloon dilation N/A 11/16/2014    Procedure: BALLOON DILATION;  Surgeon: Arta Silence, MD;  Location: [facility];  Service: Endoscopy;  Laterality: N/A;  . Esophagogastroduodenoscopy N/A 02/20/2015    Procedure: ESOPHAGOGASTRODUODENOSCOPY (EGD);  Surgeon: Arta Silence, MD;  Location: [facility];  Service: Endoscopy;  Laterality: N/A;     Current Outpatient Prescriptions  Medication Sig Dispense Refill  . amlodipine-atorvastatin (CADUET) 10-20 MG per tablet Take 1 tablet by mouth every evening.     Marland Kitchen aspirin 81 MG tablet Take 81 mg by mouth every evening.    . captopril (CAPOTEN) 50 MG tablet Take 50 mg by mouth 2 (two) times daily.     . finasteride (PROSCAR) 5 MG tablet Take 5 mg by mouth every morning.     . halobetasol (ULTRAVATE) 0.05 % ointment   0  . isosorbide mononitrate (IMDUR) 30 MG 24 hr tablet Take 30 mg by mouth every morning.     Marland Kitchen NITROSTAT 0.4 MG SL tablet Take 0.4 mg by mouth as needed. AS NEEDED FOR CHEST PAIN  0  . omeprazole (PRILOSEC) 20 MG capsule Take 20 mg by mouth daily.     No current facility-administered  medications for this visit.    Allergies:   Review of patient's allergies indicates no known allergies.    Social History:  The patient  reports that he has never smoked. He has never used smokeless tobacco. He reports that he does not drink alcohol or use illicit drugs.   Family History:  The patient's Family history is unknown by patient.    ROS:  Please see the history of present illness.    Review of Systems: Constitutional:  admits to fatigue.  HEENT: denies photophobia, eye pain, redness, hearing loss, ear pain, congestion, sore throat, rhinorrhea, sneezing,  neck pain, neck stiffness and tinnitus.  Respiratory: denies SOB, DOE, cough, chest tightness, and wheezing.  Cardiovascular: denies chest pain, palpitations and leg swelling.  Gastrointestinal: denies nausea, vomiting, abdominal pain, diarrhea, constipation, blood in stool.  Genitourinary: denies dysuria, urgency, frequency, hematuria, flank pain and difficulty urinating.  Musculoskeletal: denies  myalgias, back pain, joint swelling, arthralgias and gait problem.   Skin: denies pallor, rash and wound.  Neurological: denies dizziness, seizures, syncope, weakness, light-headedness, numbness and headaches.   Hematological: denies adenopathy, easy bruising, personal or family bleeding history.  Psychiatric/ Behavioral: denies suicidal ideation, mood changes, confusion, nervousness, sleep disturbance and agitation.       All other systems are reviewed and negative.    PHYSICAL EXAM: VS:  BP 110/66 mmHg  Pulse 74  Ht 5\' 7"  (1.702 m)  Wt 82.155 kg (181 lb 1.9 oz)  BMI 28.36 kg/m2 , BMI Body mass index is 28.36 kg/(m^2). GEN: Well nourished, well developed, in no acute distress HEENT: normal Neck: no JVD, carotid bruits, or masses Cardiac: RRR; no murmurs, rubs, or gallops,no edema  Respiratory:  clear to auscultation bilaterally, normal work of breathing GI: soft, nontender, nondistended, + BS MS: no deformity or atrophy Skin: warm and dry, no rash Neuro:  Strength and sensation are intact Psych: normal   EKG:  EKG is ordered today. The ekg ordered today demonstrates Sinus rhythm at 74.  Old Inferior MI.  PVCs    Recent Labs: 02/20/2015: BUN 18; Creatinine, Ser 1.02; Hemoglobin 15.8; Platelets 197; Potassium 3.9; Sodium 141    Lipid Panel No results found for: CHOL, TRIG, HDL, CHOLHDL, VLDL, LDLCALC, LDLDIRECT    Wt Readings from Last 3 Encounters:  07/10/15 82.155 kg (181 lb 1.9 oz)  02/20/15 83.915 kg (185 lb)  06/03/13 88.451 kg (195 lb)      Other studies  Reviewed: Additional studies/ records that were reviewed today include: . Review of the above records demonstrates:    ASSESSMENT AND PLAN:  1.  Coronary artery disease:   Patient has a history of a tight first diagonal stenosis. He was cathed approximate 10 years ago by Dr. Ilda Foil. His EKG shows abdomen old inferior wall myocardial infarction. This EKG does not correlate with his known coronary artery disease. I think that we need to proceed with a [place] study for further evaluation.  he's taking isosorbide 30 mg twice a day. We will have him take 60 mg in the morning.   2. Generalized  Fatigue: He's having fatigue and shortness breath. He's had normal left ventricle systolic function in the past. We will get an echocardiogram for further evaluation of his left ventricular function and size.   3. Premature ventricular contractions.     Current medicines are reviewed at length with the patient today.  The patient does not have concerns regarding medicines.  The following changes have been made:  no  change  Labs/ tests ordered today include:  No orders of the defined types were placed in this encounter.     Disposition:   FU with me in      Thayer Headings, MD  07/10/2015 2:47 PM    [address] [place], [place], [place]  [address] Phone: [phone]; Fax: [phone]   [facility]  [address] [place], [place]  [address] [phone]   Fax [phone]

## 2015-07-19 ENCOUNTER — Telehealth (HOSPITAL_COMMUNITY): Payer: Self-pay | Admitting: *Deleted

## 2015-07-19 NOTE — Telephone Encounter (Signed)
Patient given detailed instructions per Myocardial Perfusion Study Information Sheet for test on 07/21/15 at 715. Patient notified to arrive 15 minutes early and that it is imperative to arrive on time for appointment to keep from having the test rescheduled.  If you need to cancel or reschedule your appointment, please call the office within 24 hours of your appointment. Failure to do so may result in a cancellation of your appointment, and a $50 no show fee. Patient verbalized understanding. Hubbard Robinson, RN

## 2015-07-21 ENCOUNTER — Ambulatory Visit (HOSPITAL_BASED_OUTPATIENT_CLINIC_OR_DEPARTMENT_OTHER): Payer: Medicare Other

## 2015-07-21 ENCOUNTER — Other Ambulatory Visit: Payer: Self-pay

## 2015-07-21 ENCOUNTER — Ambulatory Visit (HOSPITAL_COMMUNITY): Payer: Medicare Other | Attending: Cardiovascular Disease

## 2015-07-21 DIAGNOSIS — I10 Essential (primary) hypertension: Secondary | ICD-10-CM | POA: Diagnosis not present

## 2015-07-21 DIAGNOSIS — I517 Cardiomegaly: Secondary | ICD-10-CM | POA: Diagnosis not present

## 2015-07-21 DIAGNOSIS — R002 Palpitations: Secondary | ICD-10-CM | POA: Insufficient documentation

## 2015-07-21 DIAGNOSIS — R5382 Chronic fatigue, unspecified: Secondary | ICD-10-CM

## 2015-07-21 DIAGNOSIS — I2510 Atherosclerotic heart disease of native coronary artery without angina pectoris: Secondary | ICD-10-CM | POA: Diagnosis not present

## 2015-07-21 DIAGNOSIS — R079 Chest pain, unspecified: Secondary | ICD-10-CM | POA: Diagnosis not present

## 2015-07-21 DIAGNOSIS — R0609 Other forms of dyspnea: Secondary | ICD-10-CM | POA: Insufficient documentation

## 2015-07-21 DIAGNOSIS — R0600 Dyspnea, unspecified: Secondary | ICD-10-CM | POA: Diagnosis present

## 2015-07-21 MED ORDER — TECHNETIUM TC 99M SESTAMIBI GENERIC - CARDIOLITE
32.1000 | Freq: Once | INTRAVENOUS | Status: AC | PRN
  Administered 2015-07-21: 32.1 via INTRAVENOUS

## 2015-07-21 MED ORDER — TECHNETIUM TC 99M SESTAMIBI GENERIC - CARDIOLITE
10.1000 | Freq: Once | INTRAVENOUS | Status: AC | PRN
  Administered 2015-07-21: 10.1 via INTRAVENOUS

## 2015-07-21 MED ORDER — REGADENOSON 0.4 MG/5ML IV SOLN
0.4000 mg | Freq: Once | INTRAVENOUS | Status: AC
  Administered 2015-07-21: 0.4 mg via INTRAVENOUS

## 2015-07-23 LAB — MYOCARDIAL PERFUSION IMAGING
CHL CUP NUCLEAR SDS: 2
CHL CUP NUCLEAR SRS: 3
LV sys vol: 29 mL
LVDIAVOL: 81 mL
NUC STRESS TID: 1.04
RATE: 0.38
Rest HR: 59 {beats}/min
SSS: 5

## 2015-08-02 ENCOUNTER — Ambulatory Visit (INDEPENDENT_AMBULATORY_CARE_PROVIDER_SITE_OTHER): Payer: Medicare Other | Admitting: Podiatry

## 2015-08-02 ENCOUNTER — Encounter: Payer: Self-pay | Admitting: Podiatry

## 2015-08-02 DIAGNOSIS — B351 Tinea unguium: Secondary | ICD-10-CM

## 2015-08-02 DIAGNOSIS — M79673 Pain in unspecified foot: Secondary | ICD-10-CM

## 2015-08-02 NOTE — Progress Notes (Signed)
Patient ID: Jonathan [name], male   DOB: 07/12/1931, 81 y.o.   MRN: [id number] Complaint:  Visit Type: Patient returns to my office for continued preventative foot care services. Complaint: Patient states" my nails have grown long and thick and become painful to walk and wear shoes". The patient presents for preventative foot care services. No changes to ROS  Podiatric Exam: Vascular: dorsalis pedis and posterior tibial pulses are palpable bilateral. Capillary return is immediate. Temperature gradient is WNL. Skin turgor WNL  Sensorium: Normal Semmes Weinstein monofilament test. Normal tactile sensation bilaterally. Nail Exam: Pt has thick disfigured discolored nails with subungual debris noted bilateral entire nail hallux through fifth toenails Ulcer Exam: There is no evidence of ulcer or pre-ulcerative changes or infection. Orthopedic Exam: Muscle tone and strength are WNL. No limitations in general ROM. No crepitus or effusions noted. Foot type and digits show no abnormalities. Bony prominences are unremarkable. Skin: No Porokeratosis. No infection or ulcers  Diagnosis:  Onychomycosis, , Pain in right toe, pain in left toes  Treatment & Plan Procedures and Treatment: Consent by patient was obtained for treatment procedures. The patient understood the discussion of treatment and procedures well. All questions were answered thoroughly reviewed. Debridement of mycotic and hypertrophic toenails, 1 through 5 bilateral and clearing of subungual debris. No ulceration, no infection noted.  Return Visit-Office Procedure: Patient instructed to return to the office for a follow up visit 3 months for continued evaluation and treatment. 

## 2015-08-29 ENCOUNTER — Ambulatory Visit (INDEPENDENT_AMBULATORY_CARE_PROVIDER_SITE_OTHER): Payer: Medicare Other | Admitting: Cardiovascular Disease

## 2015-08-29 ENCOUNTER — Encounter: Payer: Self-pay | Admitting: Cardiovascular Disease

## 2015-08-29 VITALS — BP 108/80 | HR 78 | Ht 67.0 in | Wt 192.4 lb

## 2015-08-29 DIAGNOSIS — I2510 Atherosclerotic heart disease of native coronary artery without angina pectoris: Secondary | ICD-10-CM | POA: Diagnosis not present

## 2015-08-29 DIAGNOSIS — R5382 Chronic fatigue, unspecified: Secondary | ICD-10-CM

## 2015-08-29 NOTE — Progress Notes (Signed)
Cardiology Office Note   Date:  08/29/2015   ID:  Jonathan [name], DOB 08/24/1931, MRN [id number]  PCP:  Jonathan Screws, MD  Cardiologist:   Jonathan Headings, MD   Chief Complaint  Patient presents with  . Coronary Artery Disease    Problem list 1. Atypical chest pain  2. extreme fatigue   3. essential hypertension   History of Present Illness: Jonathan [name] is a 81 y.o. male who presents for atypical CP and extreme fatigue. Feels worn out all of the time .  TSH was checked and was reportedly ok.  Had a cardiac cath 20 years ago Jonathan [name] )  - has a chronic total occlusion that was treated medically  Had a 2nd cath about 7 years later that was unchanged.  Is retired from truck driving, Chartered loss adjuster, Estate agent.   Nov. 15, 2016:  [name] was seen several months ago for some episodes of chest discomfort. He has known coronary artery disease. He had a Lexiscan Myoview study which was normal.  Past Medical History  Diagnosis Date  . Hypertension   . Myocardial infarction ([place])   . GERD (gastroesophageal reflux disease)   . Arthritis   . Neck fracture ([place]) 2015    Past Surgical History  Procedure Laterality Date  . Total hip arthroplasty      bilateral  . Corneal transplant    . Esophagogastroduodenoscopy  04/02/2012    Procedure: ESOPHAGOGASTRODUODENOSCOPY (EGD);  Surgeon: Arta Silence, MD;  Location: [facility];  Service: Endoscopy;  Laterality: N/A;  . Balloon dilation  04/02/2012    Procedure: BALLOON DILATION;  Surgeon: Arta Silence, MD;  Location: [facility];  Service: Endoscopy;  Laterality: N/A;  . Hernia repair      x 2  . Eye surgery      cataract sx  . Esophagogastroduodenoscopy N/A 05/28/2013    Procedure: ESOPHAGOGASTRODUODENOSCOPY (EGD);  Surgeon: Cleotis Nipper, MD;  Location: [facility];  Service: Endoscopy;  Laterality: N/A;  . Esophagogastroduodenoscopy (egd) with propofol N/A 06/16/2013    Procedure: ESOPHAGOGASTRODUODENOSCOPY  (EGD) WITH PROPOFOL;  Surgeon: Arta Silence, MD;  Location: [facility];  Service: Endoscopy;  Laterality: N/A;  . Balloon dilation N/A 06/16/2013    Procedure: BALLOON DILATION;  Surgeon: Arta Silence, MD;  Location: [facility];  Service: Endoscopy;  Laterality: N/A;  . Esophagogastroduodenoscopy (egd) with propofol N/A 11/16/2014    Procedure: ESOPHAGOGASTRODUODENOSCOPY (EGD) WITH PROPOFOL;  Surgeon: Arta Silence, MD;  Location: [facility];  Service: Endoscopy;  Laterality: N/A;  . Balloon dilation N/A 11/16/2014    Procedure: BALLOON DILATION;  Surgeon: Arta Silence, MD;  Location: [facility];  Service: Endoscopy;  Laterality: N/A;  . Esophagogastroduodenoscopy N/A 02/20/2015    Procedure: ESOPHAGOGASTRODUODENOSCOPY (EGD);  Surgeon: Arta Silence, MD;  Location: [facility];  Service: Endoscopy;  Laterality: N/A;     Current Outpatient Prescriptions  Medication Sig Dispense Refill  . amlodipine-atorvastatin (CADUET) 10-20 MG per tablet Take 1 tablet by mouth every evening.     Marland Kitchen aspirin 81 MG tablet Take 81 mg by mouth every evening.    . captopril (CAPOTEN) 50 MG tablet Take 50 mg by mouth 2 (two) times daily.     . finasteride (PROSCAR) 5 MG tablet Take 5 mg by mouth every morning.     . halobetasol (ULTRAVATE) 0.05 % ointment Apply topically 2 (two) times daily. APPLY TWICE DAILY TO HANDS  0  . isosorbide mononitrate (IMDUR) 30 MG 24 hr tablet Take 2 tablets (60 mg total)  by mouth daily. 60 tablet 11  . NITROSTAT 0.4 MG SL tablet Take 0.4 mg by mouth as needed. AS NEEDED FOR CHEST PAIN  0  . omeprazole (PRILOSEC) 20 MG capsule Take 20 mg by mouth daily.     No current facility-administered medications for this visit.    Allergies:   Review of patient's allergies indicates no known allergies.    Social History:  The patient  reports that he has never smoked. He has never used smokeless tobacco. He reports that he does not drink alcohol or use illicit drugs.   Family  History:  The patient's Family history is unknown by patient.    ROS:  Please see the history of present illness.    Review of Systems: Constitutional:  admits to fatigue.  HEENT: denies photophobia, eye pain, redness, hearing loss, ear pain, congestion, sore throat, rhinorrhea, sneezing, neck pain, neck stiffness and tinnitus.  Respiratory: denies SOB, DOE, cough, chest tightness, and wheezing.  Cardiovascular: denies chest pain, palpitations and leg swelling.  Gastrointestinal: denies nausea, vomiting, abdominal pain, diarrhea, constipation, blood in stool.  Genitourinary: denies dysuria, urgency, frequency, hematuria, flank pain and difficulty urinating.  Musculoskeletal: denies  myalgias, back pain, joint swelling, arthralgias and gait problem.   Skin: denies pallor, rash and wound.  Neurological: denies dizziness, seizures, syncope, weakness, light-headedness, numbness and headaches.   Hematological: denies adenopathy, easy bruising, personal or family bleeding history.  Psychiatric/ Behavioral: denies suicidal ideation, mood changes, confusion, nervousness, sleep disturbance and agitation.       All other systems are reviewed and negative.    PHYSICAL EXAM: VS:  BP 108/80 mmHg  Pulse 78  Ht 5\' 7"  (1.702 m)  Wt 192 lb 6.4 oz (87.272 kg)  BMI 30.13 kg/m2 , BMI Body mass index is 30.13 kg/(m^2). GEN: Well nourished, well developed, in no acute distress HEENT: normal Neck: no JVD, carotid bruits, or masses Cardiac: RRR; no murmurs, rubs, or gallops,no edema  Respiratory:  clear to auscultation bilaterally, normal work of breathing GI: soft, nontender, nondistended, + BS MS: no deformity or atrophy Skin: warm and dry, no rash Neuro:  Strength and sensation are intact Psych: normal   EKG:  EKG is ordered today. The ekg ordered today demonstrates Sinus rhythm at 74.  Old Inferior MI.  PVCs    Recent Labs: 02/20/2015: BUN 18; Creatinine, Ser 1.02; Hemoglobin 15.8;  Platelets 197; Potassium 3.9; Sodium 141    Lipid Panel No results found for: CHOL, TRIG, HDL, CHOLHDL, VLDL, LDLCALC, LDLDIRECT    Wt Readings from Last 3 Encounters:  08/29/15 192 lb 6.4 oz (87.272 kg)  07/21/15 181 lb (82.101 kg)  07/10/15 181 lb 1.9 oz (82.155 kg)      Other studies Reviewed: Additional studies/ records that were reviewed today include: . Review of the above records demonstrates:    ASSESSMENT AND PLAN:  1.  Coronary artery disease:   Patient has a history of a tight first diagonal stenosis. He was cathed approximate 10 years ago by Dr. Ilda Foil. His EKG shows abdomen old inferior wall myocardial infarction.  His Myoview study showed no perfusion abnormalities. His left ventricle systolic function is normal with an EF of 58%.    2. Generalized  Fatigue: He's having fatigue and shortness breath.  He has no evidence of ischemia. Has normal left ventricle systolic function by echo and by Myoview study. He'll contact his general medical doctor for further evaluation of his generalized fatigue.   3. Premature ventricular contractions.  Current medicines are reviewed at length with the patient today.  The patient does not have concerns regarding medicines.  The following changes have been made:  no change  Labs/ tests ordered today include:  No orders of the defined types were placed in this encounter.     Disposition:   FU with me as needed.     Nahser, Wonda Cheng, MD  08/29/2015 11:01 AM    [facility] [place], [place], [place]  [address] Phone: [phone]; Fax: [phone]   [facility]  [address] [place], [place]  [address] [phone]   Fax [phone]

## 2015-08-29 NOTE — Patient Instructions (Signed)
Medication Instructions:  Your physician recommends that you continue on your current medications as directed. Please refer to the Current Medication list given to you today.   Labwork: None Ordered   Testing/Procedures: None Ordered   Follow-Up: Your physician recommends that you schedule a follow-up appointment in: as needed with Dr. Nahser   If you need a refill on your cardiac medications before your next appointment, please call your pharmacy.   Thank you for choosing [facility]! Jaelah Hauth, RN [phone]    

## 2015-09-22 DIAGNOSIS — M109 Gout, unspecified: Secondary | ICD-10-CM | POA: Diagnosis not present

## 2015-09-22 DIAGNOSIS — R1310 Dysphagia, unspecified: Secondary | ICD-10-CM | POA: Diagnosis not present

## 2015-09-22 DIAGNOSIS — Z23 Encounter for immunization: Secondary | ICD-10-CM | POA: Diagnosis not present

## 2015-09-22 DIAGNOSIS — K222 Esophageal obstruction: Secondary | ICD-10-CM | POA: Diagnosis not present

## 2015-09-22 DIAGNOSIS — I25119 Atherosclerotic heart disease of native coronary artery with unspecified angina pectoris: Secondary | ICD-10-CM | POA: Diagnosis not present

## 2015-09-22 DIAGNOSIS — I10 Essential (primary) hypertension: Secondary | ICD-10-CM | POA: Diagnosis not present

## 2015-09-22 DIAGNOSIS — Z79899 Other long term (current) drug therapy: Secondary | ICD-10-CM | POA: Diagnosis not present

## 2015-09-22 DIAGNOSIS — E119 Type 2 diabetes mellitus without complications: Secondary | ICD-10-CM | POA: Diagnosis not present

## 2015-09-22 DIAGNOSIS — Z0001 Encounter for general adult medical examination with abnormal findings: Secondary | ICD-10-CM | POA: Diagnosis not present

## 2015-09-22 DIAGNOSIS — N400 Enlarged prostate without lower urinary tract symptoms: Secondary | ICD-10-CM | POA: Diagnosis not present

## 2015-09-22 DIAGNOSIS — I252 Old myocardial infarction: Secondary | ICD-10-CM | POA: Diagnosis not present

## 2015-09-22 DIAGNOSIS — Z1389 Encounter for screening for other disorder: Secondary | ICD-10-CM | POA: Diagnosis not present

## 2015-10-26 DIAGNOSIS — K222 Esophageal obstruction: Secondary | ICD-10-CM | POA: Diagnosis not present

## 2015-11-01 ENCOUNTER — Ambulatory Visit (INDEPENDENT_AMBULATORY_CARE_PROVIDER_SITE_OTHER): Payer: Medicare Other | Admitting: Podiatry

## 2015-11-01 ENCOUNTER — Encounter: Payer: Self-pay | Admitting: Podiatry

## 2015-11-01 DIAGNOSIS — M79673 Pain in unspecified foot: Secondary | ICD-10-CM | POA: Diagnosis not present

## 2015-11-01 DIAGNOSIS — B351 Tinea unguium: Secondary | ICD-10-CM | POA: Diagnosis not present

## 2015-11-01 NOTE — Progress Notes (Signed)
Patient ID: Jonathan [name], male   DOB: 04/24/1931, 82 y.o.   MRN: [id number] Complaint:  Visit Type: Patient returns to my office for continued preventative foot care services. Complaint: Patient states" my nails have grown long and thick and become painful to walk and wear shoes". The patient presents for preventative foot care services. No changes to ROS  Podiatric Exam: Vascular: dorsalis pedis and posterior tibial pulses are palpable bilateral. Capillary return is immediate. Temperature gradient is WNL. Skin turgor WNL  Sensorium: Normal Semmes Weinstein monofilament test. Normal tactile sensation bilaterally. Nail Exam: Pt has thick disfigured discolored nails with subungual debris noted bilateral entire nail hallux through fifth toenails Ulcer Exam: There is no evidence of ulcer or pre-ulcerative changes or infection. Orthopedic Exam: Muscle tone and strength are WNL. No limitations in general ROM. No crepitus or effusions noted. Foot type and digits show no abnormalities. Bony prominences are unremarkable. Skin: No Porokeratosis. No infection or ulcers  Diagnosis:  Onychomycosis, , Pain in right toe, pain in left toes  Treatment & Plan Procedures and Treatment: Consent by patient was obtained for treatment procedures. The patient understood the discussion of treatment and procedures well. All questions were answered thoroughly reviewed. Debridement of mycotic and hypertrophic toenails, 1 through 5 bilateral and clearing of subungual debris. No ulceration, no infection noted.  Return Visit-Office Procedure: Patient instructed to return to the office for a follow up visit 3 months for continued evaluation and treatment.

## 2015-11-23 DIAGNOSIS — L821 Other seborrheic keratosis: Secondary | ICD-10-CM | POA: Diagnosis not present

## 2015-11-23 DIAGNOSIS — Z85828 Personal history of other malignant neoplasm of skin: Secondary | ICD-10-CM | POA: Diagnosis not present

## 2015-11-23 DIAGNOSIS — L570 Actinic keratosis: Secondary | ICD-10-CM | POA: Diagnosis not present

## 2015-11-23 DIAGNOSIS — L400 Psoriasis vulgaris: Secondary | ICD-10-CM | POA: Diagnosis not present

## 2015-12-27 DIAGNOSIS — Z85828 Personal history of other malignant neoplasm of skin: Secondary | ICD-10-CM | POA: Diagnosis not present

## 2015-12-27 DIAGNOSIS — D485 Neoplasm of uncertain behavior of skin: Secondary | ICD-10-CM | POA: Diagnosis not present

## 2015-12-27 DIAGNOSIS — L570 Actinic keratosis: Secondary | ICD-10-CM | POA: Diagnosis not present

## 2015-12-27 DIAGNOSIS — L821 Other seborrheic keratosis: Secondary | ICD-10-CM | POA: Diagnosis not present

## 2015-12-27 DIAGNOSIS — C44329 Squamous cell carcinoma of skin of other parts of face: Secondary | ICD-10-CM | POA: Diagnosis not present

## 2016-01-31 ENCOUNTER — Ambulatory Visit (INDEPENDENT_AMBULATORY_CARE_PROVIDER_SITE_OTHER): Payer: Medicare Other | Admitting: Podiatry

## 2016-01-31 ENCOUNTER — Encounter: Payer: Self-pay | Admitting: Podiatry

## 2016-01-31 DIAGNOSIS — M79673 Pain in unspecified foot: Secondary | ICD-10-CM | POA: Diagnosis not present

## 2016-01-31 DIAGNOSIS — B351 Tinea unguium: Secondary | ICD-10-CM

## 2016-01-31 NOTE — Progress Notes (Signed)
Patient ID: Jonathan [name], male   DOB: 10-25-1930, 82 y.o.   MRN: [id number] Complaint:  Visit Type: Patient returns to my office for continued preventative foot care services. Complaint: Patient states" my nails have grown long and thick and become painful to walk and wear shoes". The patient presents for preventative foot care services. No changes to ROS  Podiatric Exam: Vascular: dorsalis pedis and posterior tibial pulses are palpable bilateral. Capillary return is immediate. Temperature gradient is WNL. Skin turgor WNL  Sensorium: Normal Semmes Weinstein monofilament test. Normal tactile sensation bilaterally. Nail Exam: Pt has thick disfigured discolored nails with subungual debris noted bilateral entire nail hallux through fifth toenails Ulcer Exam: There is no evidence of ulcer or pre-ulcerative changes or infection. Orthopedic Exam: Muscle tone and strength are WNL. No limitations in general ROM. No crepitus or effusions noted. Foot type and digits show no abnormalities. Bony prominences are unremarkable. Skin: No Porokeratosis. No infection or ulcers  Diagnosis:  Onychomycosis, , Pain in right toe, pain in left toes  Treatment & Plan Procedures and Treatment: Consent by patient was obtained for treatment procedures. The patient understood the discussion of treatment and procedures well. All questions were answered thoroughly reviewed. Debridement of mycotic and hypertrophic toenails, 1 through 5 bilateral and clearing of subungual debris. No ulceration, no infection noted. Miracle 3 dispensed. Return Visit-Office Procedure: Patient instructed to return to the office for a follow up visit 3 months for continued evaluation and treatment.   Gardiner Barefoot DPM

## 2016-03-21 DIAGNOSIS — I10 Essential (primary) hypertension: Secondary | ICD-10-CM | POA: Diagnosis not present

## 2016-03-21 DIAGNOSIS — I252 Old myocardial infarction: Secondary | ICD-10-CM | POA: Diagnosis not present

## 2016-03-21 DIAGNOSIS — N400 Enlarged prostate without lower urinary tract symptoms: Secondary | ICD-10-CM | POA: Diagnosis not present

## 2016-03-21 DIAGNOSIS — R5383 Other fatigue: Secondary | ICD-10-CM | POA: Diagnosis not present

## 2016-03-21 DIAGNOSIS — Z79899 Other long term (current) drug therapy: Secondary | ICD-10-CM | POA: Diagnosis not present

## 2016-03-21 DIAGNOSIS — E119 Type 2 diabetes mellitus without complications: Secondary | ICD-10-CM | POA: Diagnosis not present

## 2016-03-21 DIAGNOSIS — I25119 Atherosclerotic heart disease of native coronary artery with unspecified angina pectoris: Secondary | ICD-10-CM | POA: Diagnosis not present

## 2016-03-21 DIAGNOSIS — M109 Gout, unspecified: Secondary | ICD-10-CM | POA: Diagnosis not present

## 2016-03-21 DIAGNOSIS — R1310 Dysphagia, unspecified: Secondary | ICD-10-CM | POA: Diagnosis not present

## 2016-03-21 DIAGNOSIS — K222 Esophageal obstruction: Secondary | ICD-10-CM | POA: Diagnosis not present

## 2016-04-10 DIAGNOSIS — Z947 Corneal transplant status: Secondary | ICD-10-CM | POA: Diagnosis not present

## 2016-04-10 DIAGNOSIS — Z961 Presence of intraocular lens: Secondary | ICD-10-CM | POA: Diagnosis not present

## 2016-05-01 ENCOUNTER — Ambulatory Visit (INDEPENDENT_AMBULATORY_CARE_PROVIDER_SITE_OTHER): Payer: Medicare Other | Admitting: Podiatry

## 2016-05-01 ENCOUNTER — Encounter: Payer: Self-pay | Admitting: Podiatry

## 2016-05-01 DIAGNOSIS — M79673 Pain in unspecified foot: Secondary | ICD-10-CM

## 2016-05-01 DIAGNOSIS — B351 Tinea unguium: Secondary | ICD-10-CM

## 2016-05-01 NOTE — Progress Notes (Signed)
Patient ID: Jonathan [name], male   DOB: Aug 31, 1931, 82 y.o.   MRN: [id number] Complaint:  Visit Type: Patient returns to my office for continued preventative foot care services. Complaint: Patient states" my nails have grown long and thick and become painful to walk and wear shoes". The patient presents for preventative foot care services. No changes to ROS  Podiatric Exam: Vascular: dorsalis pedis and posterior tibial pulses are palpable bilateral. Capillary return is immediate. Temperature gradient is WNL. Skin turgor WNL  Sensorium: Normal Semmes Weinstein monofilament test. Normal tactile sensation bilaterally. Nail Exam: Pt has thick disfigured discolored nails with subungual debris noted bilateral entire nail hallux through fifth toenails Ulcer Exam: There is no evidence of ulcer or pre-ulcerative changes or infection. Orthopedic Exam: Muscle tone and strength are WNL. No limitations in general ROM. No crepitus or effusions noted. Foot type and digits show no abnormalities. Bony prominences are unremarkable. Skin: No Porokeratosis. No infection or ulcers  Diagnosis:  Onychomycosis, , Pain in right toe, pain in left toes  Treatment & Plan Procedures and Treatment: Consent by patient was obtained for treatment procedures. The patient understood the discussion of treatment and procedures well. All questions were answered thoroughly reviewed. Debridement of mycotic and hypertrophic toenails, 1 through 5 bilateral and clearing of subungual debris. No ulceration, no infection noted. Miracle 3 to be continued until empty. Return Visit-Office Procedure: Patient instructed to return to the office for a follow up visit 3 months for continued evaluation and treatment.   Gardiner Barefoot DPM

## 2016-05-11 DIAGNOSIS — M5136 Other intervertebral disc degeneration, lumbar region: Secondary | ICD-10-CM | POA: Diagnosis not present

## 2016-05-11 DIAGNOSIS — M25551 Pain in right hip: Secondary | ICD-10-CM | POA: Diagnosis not present

## 2016-05-11 DIAGNOSIS — M25552 Pain in left hip: Secondary | ICD-10-CM | POA: Diagnosis not present

## 2016-05-22 DIAGNOSIS — D2371 Other benign neoplasm of skin of right lower limb, including hip: Secondary | ICD-10-CM | POA: Diagnosis not present

## 2016-05-22 DIAGNOSIS — Z85828 Personal history of other malignant neoplasm of skin: Secondary | ICD-10-CM | POA: Diagnosis not present

## 2016-05-22 DIAGNOSIS — L821 Other seborrheic keratosis: Secondary | ICD-10-CM | POA: Diagnosis not present

## 2016-05-22 DIAGNOSIS — L400 Psoriasis vulgaris: Secondary | ICD-10-CM | POA: Diagnosis not present

## 2016-05-22 DIAGNOSIS — L720 Epidermal cyst: Secondary | ICD-10-CM | POA: Diagnosis not present

## 2016-05-22 DIAGNOSIS — L570 Actinic keratosis: Secondary | ICD-10-CM | POA: Diagnosis not present

## 2016-06-26 DIAGNOSIS — Z23 Encounter for immunization: Secondary | ICD-10-CM | POA: Diagnosis not present

## 2016-06-26 DIAGNOSIS — I10 Essential (primary) hypertension: Secondary | ICD-10-CM | POA: Diagnosis not present

## 2016-07-01 DIAGNOSIS — R972 Elevated prostate specific antigen [PSA]: Secondary | ICD-10-CM | POA: Diagnosis not present

## 2016-07-01 DIAGNOSIS — N401 Enlarged prostate with lower urinary tract symptoms: Secondary | ICD-10-CM | POA: Diagnosis not present

## 2016-07-01 DIAGNOSIS — N5201 Erectile dysfunction due to arterial insufficiency: Secondary | ICD-10-CM | POA: Diagnosis not present

## 2016-07-01 DIAGNOSIS — R351 Nocturia: Secondary | ICD-10-CM | POA: Diagnosis not present

## 2016-07-11 DIAGNOSIS — I252 Old myocardial infarction: Secondary | ICD-10-CM | POA: Diagnosis not present

## 2016-07-11 DIAGNOSIS — K222 Esophageal obstruction: Secondary | ICD-10-CM | POA: Diagnosis not present

## 2016-07-11 DIAGNOSIS — I10 Essential (primary) hypertension: Secondary | ICD-10-CM | POA: Diagnosis not present

## 2016-07-24 ENCOUNTER — Encounter: Payer: Self-pay | Admitting: Podiatry

## 2016-07-24 ENCOUNTER — Ambulatory Visit (INDEPENDENT_AMBULATORY_CARE_PROVIDER_SITE_OTHER): Payer: Medicare Other | Admitting: Podiatry

## 2016-07-24 VITALS — BP 154/84 | HR 62 | Resp 14

## 2016-07-24 DIAGNOSIS — B351 Tinea unguium: Secondary | ICD-10-CM | POA: Diagnosis not present

## 2016-07-24 DIAGNOSIS — M79676 Pain in unspecified toe(s): Secondary | ICD-10-CM

## 2016-07-24 NOTE — Progress Notes (Signed)
Patient ID: Jonathan [name], male   DOB: [birth year], 81 y.o.   MRN: [id number] Complaint:  Visit Type: Patient returns to my office for continued preventative foot care services. Complaint: Patient states" my nails have grown long and thick and become painful to walk and wear shoes". The patient presents for preventative foot care services. No changes to ROS  Podiatric Exam: Vascular: dorsalis pedis and posterior tibial pulses are palpable bilateral. Capillary return is immediate. Temperature gradient is WNL. Skin turgor WNL  Sensorium: Normal Semmes Weinstein monofilament test. Normal tactile sensation bilaterally. Nail Exam: Pt has thick disfigured discolored nails with subungual debris noted bilateral entire nail hallux through fifth toenails Ulcer Exam: There is no evidence of ulcer or pre-ulcerative changes or infection. Orthopedic Exam: Muscle tone and strength are WNL. No limitations in general ROM. No crepitus or effusions noted. Foot type and digits show no abnormalities. Bony prominences are unremarkable. Skin: No Porokeratosis. No infection or ulcers  Diagnosis:  Onychomycosis, , Pain in right toe, pain in left toes  Treatment & Plan Procedures and Treatment: Consent by patient was obtained for treatment procedures. The patient understood the discussion of treatment and procedures well. All questions were answered thoroughly reviewed. Debridement of mycotic and hypertrophic toenails, 1 through 5 bilateral and clearing of subungual debris. No ulceration, no infection noted. Miracle 3 to be continued until empty. Return Visit-Office Procedure: Patient instructed to return to the office for a follow up visit 3 months for continued evaluation and treatment.   Gardiner Barefoot DPM

## 2016-09-10 DIAGNOSIS — J069 Acute upper respiratory infection, unspecified: Secondary | ICD-10-CM | POA: Diagnosis not present

## 2016-09-10 DIAGNOSIS — I10 Essential (primary) hypertension: Secondary | ICD-10-CM | POA: Diagnosis not present

## 2016-10-16 ENCOUNTER — Ambulatory Visit (INDEPENDENT_AMBULATORY_CARE_PROVIDER_SITE_OTHER): Payer: Medicare Other | Admitting: Podiatry

## 2016-10-16 ENCOUNTER — Encounter: Payer: Self-pay | Admitting: Podiatry

## 2016-10-16 VITALS — Ht 67.0 in | Wt 192.0 lb

## 2016-10-16 DIAGNOSIS — B351 Tinea unguium: Secondary | ICD-10-CM | POA: Diagnosis not present

## 2016-10-16 DIAGNOSIS — M79676 Pain in unspecified toe(s): Secondary | ICD-10-CM

## 2016-10-16 NOTE — Progress Notes (Signed)
Patient ID: Jonathan [name], male   DOB: 12/15/1930, 83 y.o.   MRN: [id number] Complaint:  Visit Type: Patient returns to my office for continued preventative foot care services. Complaint: Patient states" my nails have grown long and thick and become painful to walk and wear shoes". The patient presents for preventative foot care services. No changes to ROS  Podiatric Exam: Vascular: dorsalis pedis and posterior tibial pulses are palpable bilateral. Capillary return is immediate. Temperature gradient is WNL. Skin turgor WNL  Sensorium: Normal Semmes Weinstein monofilament test. Normal tactile sensation bilaterally. Nail Exam: Pt has thick disfigured discolored nails with subungual debris noted bilateral entire nail hallux through fifth toenails Ulcer Exam: There is no evidence of ulcer or pre-ulcerative changes or infection. Orthopedic Exam: Muscle tone and strength are WNL. No limitations in general ROM. No crepitus or effusions noted. Foot type and digits show no abnormalities. Bony prominences are unremarkable. Skin: No Porokeratosis. No infection or ulcers  Diagnosis:  Onychomycosis, , Pain in right toe, pain in left toes  Treatment & Plan Procedures and Treatment: Consent by patient was obtained for treatment procedures. The patient understood the discussion of treatment and procedures well. All questions were answered thoroughly reviewed. Debridement of mycotic and hypertrophic toenails, 1 through 5 bilateral and clearing of subungual debris. No ulceration, no infection noted. Miracle 3 to be continued until empty. Return Visit-Office Procedure: Patient instructed to return to the office for a follow up visit 3 months for continued evaluation and treatment.   Gardiner Barefoot DPM

## 2016-11-01 DIAGNOSIS — R5383 Other fatigue: Secondary | ICD-10-CM | POA: Diagnosis not present

## 2016-11-01 DIAGNOSIS — I10 Essential (primary) hypertension: Secondary | ICD-10-CM | POA: Diagnosis not present

## 2016-11-01 DIAGNOSIS — R1310 Dysphagia, unspecified: Secondary | ICD-10-CM | POA: Diagnosis not present

## 2016-11-01 DIAGNOSIS — K222 Esophageal obstruction: Secondary | ICD-10-CM | POA: Diagnosis not present

## 2016-11-01 DIAGNOSIS — R202 Paresthesia of skin: Secondary | ICD-10-CM | POA: Diagnosis not present

## 2016-11-01 DIAGNOSIS — E119 Type 2 diabetes mellitus without complications: Secondary | ICD-10-CM | POA: Diagnosis not present

## 2016-11-01 DIAGNOSIS — I25119 Atherosclerotic heart disease of native coronary artery with unspecified angina pectoris: Secondary | ICD-10-CM | POA: Diagnosis not present

## 2016-11-01 DIAGNOSIS — Z0000 Encounter for general adult medical examination without abnormal findings: Secondary | ICD-10-CM | POA: Diagnosis not present

## 2016-11-01 DIAGNOSIS — Z1389 Encounter for screening for other disorder: Secondary | ICD-10-CM | POA: Diagnosis not present

## 2016-11-01 DIAGNOSIS — L821 Other seborrheic keratosis: Secondary | ICD-10-CM | POA: Diagnosis not present

## 2016-11-01 DIAGNOSIS — M109 Gout, unspecified: Secondary | ICD-10-CM | POA: Diagnosis not present

## 2016-11-01 DIAGNOSIS — Z79899 Other long term (current) drug therapy: Secondary | ICD-10-CM | POA: Diagnosis not present

## 2016-11-01 DIAGNOSIS — L570 Actinic keratosis: Secondary | ICD-10-CM | POA: Diagnosis not present

## 2016-11-01 DIAGNOSIS — I252 Old myocardial infarction: Secondary | ICD-10-CM | POA: Diagnosis not present

## 2016-11-01 DIAGNOSIS — N400 Enlarged prostate without lower urinary tract symptoms: Secondary | ICD-10-CM | POA: Diagnosis not present

## 2016-12-26 DIAGNOSIS — L820 Inflamed seborrheic keratosis: Secondary | ICD-10-CM | POA: Diagnosis not present

## 2016-12-26 DIAGNOSIS — L304 Erythema intertrigo: Secondary | ICD-10-CM | POA: Diagnosis not present

## 2016-12-26 DIAGNOSIS — L570 Actinic keratosis: Secondary | ICD-10-CM | POA: Diagnosis not present

## 2016-12-26 DIAGNOSIS — Z85828 Personal history of other malignant neoplasm of skin: Secondary | ICD-10-CM | POA: Diagnosis not present

## 2016-12-26 DIAGNOSIS — L821 Other seborrheic keratosis: Secondary | ICD-10-CM | POA: Diagnosis not present

## 2016-12-26 DIAGNOSIS — D485 Neoplasm of uncertain behavior of skin: Secondary | ICD-10-CM | POA: Diagnosis not present

## 2016-12-26 DIAGNOSIS — C44319 Basal cell carcinoma of skin of other parts of face: Secondary | ICD-10-CM | POA: Diagnosis not present

## 2016-12-30 DIAGNOSIS — I10 Essential (primary) hypertension: Secondary | ICD-10-CM | POA: Diagnosis not present

## 2016-12-30 DIAGNOSIS — E119 Type 2 diabetes mellitus without complications: Secondary | ICD-10-CM | POA: Diagnosis not present

## 2016-12-30 DIAGNOSIS — K222 Esophageal obstruction: Secondary | ICD-10-CM | POA: Diagnosis not present

## 2016-12-30 DIAGNOSIS — R413 Other amnesia: Secondary | ICD-10-CM | POA: Diagnosis not present

## 2016-12-30 DIAGNOSIS — R202 Paresthesia of skin: Secondary | ICD-10-CM | POA: Diagnosis not present

## 2016-12-30 DIAGNOSIS — N400 Enlarged prostate without lower urinary tract symptoms: Secondary | ICD-10-CM | POA: Diagnosis not present

## 2016-12-30 DIAGNOSIS — I252 Old myocardial infarction: Secondary | ICD-10-CM | POA: Diagnosis not present

## 2016-12-30 DIAGNOSIS — M109 Gout, unspecified: Secondary | ICD-10-CM | POA: Diagnosis not present

## 2016-12-30 DIAGNOSIS — R1310 Dysphagia, unspecified: Secondary | ICD-10-CM | POA: Diagnosis not present

## 2017-01-15 ENCOUNTER — Ambulatory Visit: Payer: Medicare Other | Admitting: Podiatry

## 2017-01-15 ENCOUNTER — Ambulatory Visit (INDEPENDENT_AMBULATORY_CARE_PROVIDER_SITE_OTHER): Payer: Medicare Other | Admitting: Podiatry

## 2017-01-15 ENCOUNTER — Encounter: Payer: Self-pay | Admitting: Podiatry

## 2017-01-15 DIAGNOSIS — B351 Tinea unguium: Secondary | ICD-10-CM

## 2017-01-15 DIAGNOSIS — M79676 Pain in unspecified toe(s): Secondary | ICD-10-CM | POA: Diagnosis not present

## 2017-01-15 NOTE — Progress Notes (Signed)
Patient ID: Jonathan [name], male   DOB: 1931-07-14, 83 y.o.   MRN: [id number] Complaint:  Visit Type: Patient returns to my office for continued preventative foot care services. Complaint: Patient states" my nails have grown long and thick and become painful to walk and wear shoes". The patient presents for preventative foot care services. No changes to ROS  Podiatric Exam: Vascular: dorsalis pedis and posterior tibial pulses are palpable bilateral. Capillary return is immediate. Temperature gradient is WNL. Skin turgor WNL  Sensorium: Normal Semmes Weinstein monofilament test. Normal tactile sensation bilaterally. Nail Exam: Pt has thick disfigured discolored nails with subungual debris noted bilateral entire nail hallux through fifth toenails Ulcer Exam: There is no evidence of ulcer or pre-ulcerative changes or infection. Orthopedic Exam: Muscle tone and strength are WNL. No limitations in general ROM. No crepitus or effusions noted. Foot type and digits show no abnormalities. Bony prominences are unremarkable. Skin: No Porokeratosis. No infection or ulcers  Diagnosis:  Onychomycosis, , Pain in right toe, pain in left toes  Treatment & Plan Procedures and Treatment: Consent by patient was obtained for treatment procedures. The patient understood the discussion of treatment and procedures well. All questions were answered thoroughly reviewed. Debridement of mycotic and hypertrophic toenails, 1 through 5 bilateral and clearing of subungual debris. No ulceration, no infection noted.  Return Visit-Office Procedure: Patient instructed to return to the office for a follow up visit 3 months for continued evaluation and treatment.   Gardiner Barefoot DPM

## 2017-02-06 ENCOUNTER — Other Ambulatory Visit: Payer: Self-pay | Admitting: Gastroenterology

## 2017-02-06 DIAGNOSIS — R1310 Dysphagia, unspecified: Secondary | ICD-10-CM | POA: Diagnosis not present

## 2017-02-12 ENCOUNTER — Encounter (HOSPITAL_COMMUNITY): Payer: Self-pay | Admitting: *Deleted

## 2017-02-12 NOTE — Progress Notes (Signed)
Pt SDW Pre-op call completed by both pt and pt spouse, Rose with pt verbal consent. Pt denies SOB and chest pain. Pt was being followed by Dr. Acie Fredrickson, Cardiology. Spouse denies that pt had an EKG and chest x ray within the last year. Spouse made aware to have pt stop taking vitamins, fish oil, and herbal medications. Do not take any NSAIDs ie: Ibuprofen, Advil, Naproxen  BC and Goody Powder. Spouse verbalized understanding of all pre-op instructions.

## 2017-02-13 ENCOUNTER — Ambulatory Visit (HOSPITAL_COMMUNITY): Payer: Medicare Other | Admitting: Anesthesiology

## 2017-02-13 ENCOUNTER — Ambulatory Visit (HOSPITAL_COMMUNITY)
Admission: RE | Admit: 2017-02-13 | Discharge: 2017-02-13 | Disposition: A | Discharge status: Home / Self Care | Payer: Medicare Other | Source: Ambulatory Visit | Attending: Gastroenterology | Admitting: Gastroenterology

## 2017-02-13 ENCOUNTER — Encounter (HOSPITAL_COMMUNITY): Payer: Self-pay

## 2017-02-13 ENCOUNTER — Telehealth: Payer: Self-pay | Admitting: Cardiovascular Disease

## 2017-02-13 ENCOUNTER — Encounter (HOSPITAL_COMMUNITY)
Admission: RE | Disposition: A | Discharge status: Home / Self Care | Payer: Self-pay | Source: Ambulatory Visit | Attending: Gastroenterology

## 2017-02-13 ENCOUNTER — Other Ambulatory Visit: Payer: Self-pay | Admitting: Gastroenterology

## 2017-02-13 DIAGNOSIS — I4891 Unspecified atrial fibrillation: Secondary | ICD-10-CM | POA: Diagnosis not present

## 2017-02-13 DIAGNOSIS — I252 Old myocardial infarction: Secondary | ICD-10-CM | POA: Insufficient documentation

## 2017-02-13 DIAGNOSIS — Q399 Congenital malformation of esophagus, unspecified: Secondary | ICD-10-CM | POA: Diagnosis not present

## 2017-02-13 DIAGNOSIS — K219 Gastro-esophageal reflux disease without esophagitis: Secondary | ICD-10-CM | POA: Diagnosis not present

## 2017-02-13 DIAGNOSIS — R1310 Dysphagia, unspecified: Secondary | ICD-10-CM | POA: Insufficient documentation

## 2017-02-13 DIAGNOSIS — K449 Diaphragmatic hernia without obstruction or gangrene: Secondary | ICD-10-CM | POA: Diagnosis not present

## 2017-02-13 DIAGNOSIS — E119 Type 2 diabetes mellitus without complications: Secondary | ICD-10-CM | POA: Diagnosis not present

## 2017-02-13 DIAGNOSIS — I481 Persistent atrial fibrillation: Secondary | ICD-10-CM | POA: Diagnosis not present

## 2017-02-13 DIAGNOSIS — M1990 Unspecified osteoarthritis, unspecified site: Secondary | ICD-10-CM | POA: Insufficient documentation

## 2017-02-13 DIAGNOSIS — I10 Essential (primary) hypertension: Secondary | ICD-10-CM | POA: Insufficient documentation

## 2017-02-13 DIAGNOSIS — K222 Esophageal obstruction: Secondary | ICD-10-CM | POA: Diagnosis not present

## 2017-02-13 HISTORY — DX: Prediabetes: R73.03

## 2017-02-13 HISTORY — DX: Esophageal obstruction: K22.2

## 2017-02-13 HISTORY — PX: ESOPHAGOGASTRODUODENOSCOPY (EGD) WITH PROPOFOL: SHX5813

## 2017-02-13 HISTORY — PX: BALLOON DILATION: SHX5330

## 2017-02-13 SURGERY — ESOPHAGOGASTRODUODENOSCOPY (EGD) WITH PROPOFOL
Anesthesia: Monitor Anesthesia Care

## 2017-02-13 MED ORDER — PHENYLEPHRINE HCL 10 MG/ML IJ SOLN
INTRAMUSCULAR | Status: DC | PRN
  Administered 2017-02-13 (×2): 80 ug via INTRAVENOUS

## 2017-02-13 MED ORDER — LACTATED RINGERS IV SOLN
INTRAVENOUS | Status: DC | PRN
  Administered 2017-02-13: 11:00:00 via INTRAVENOUS

## 2017-02-13 MED ORDER — LACTATED RINGERS IV SOLN
INTRAVENOUS | Status: DC
  Administered 2017-02-13: 09:00:00 via INTRAVENOUS

## 2017-02-13 MED ORDER — SODIUM CHLORIDE 0.9 % IV SOLN: INTRAVENOUS | Status: DC

## 2017-02-13 MED ORDER — PROPOFOL 500 MG/50ML IV EMUL
INTRAVENOUS | Status: DC | PRN
  Administered 2017-02-13: 75 ug/kg/min via INTRAVENOUS

## 2017-02-13 MED ORDER — BUTAMBEN-TETRACAINE-BENZOCAINE 2-2-14 % EX AERO
INHALATION_SPRAY | CUTANEOUS | Status: DC | PRN
  Administered 2017-02-13: 1 via TOPICAL

## 2017-02-13 SURGICAL SUPPLY — 14 items

## 2017-02-13 NOTE — Discharge Instructions (Signed)
Endoscopy °Care After °Please read the instructions outlined below and refer to this sheet in the next few weeks. These discharge instructions provide you with general information on caring for yourself after you leave the hospital. Your doctor may also give you specific instructions. While your treatment has been planned according to the most current medical practices available, unavoidable complications occasionally occur. If you have any problems or questions after discharge, please call Dr. Orville Widmann ([facility]) at [phone]. ° °HOME CARE INSTRUCTIONS °Activity °· You may resume your regular activity but move at a slower pace for the next 24 hours.  °· Take frequent rest periods for the next 24 hours.  °· Walking will help expel (get rid of) the air and reduce the bloated feeling in your abdomen.  °· No driving for 24 hours (because of the anesthesia (medicine) used during the test).  °· You may shower.  °· Do not sign any important legal documents or operate any machinery for 24 hours (because of the anesthesia used during the test).  °Nutrition °· Drink plenty of fluids.  °· You may resume your normal diet.  °· Begin with a light meal and progress to your normal diet.  °· Avoid alcoholic beverages for 24 hours or as instructed by your caregiver.  °Medications °You may resume your normal medications unless your caregiver tells you otherwise. °What you can expect today °· You may experience abdominal discomfort such as a feeling of fullness or "gas" pains.  °· You may experience a sore throat for 2 to 3 days. This is normal. Gargling with salt water may help this.  °·  °SEEK IMMEDIATE MEDICAL CARE IF: °· You have excessive nausea (feeling sick to your stomach) and/or vomiting.  °· You have severe abdominal pain and distention (swelling).  °· You have trouble swallowing.  °· You have a temperature over 100° F (37.8° C).  °· You have rectal bleeding or vomiting of blood.  °Document Released:  05/14/2004 Document Revised: 06/12/2011 Document Reviewed: 11/25/2007 °Exit[employer]® Patient Information ©2012 [employer]. °

## 2017-02-13 NOTE — Transfer of Care (Signed)
Immediate Anesthesia Transfer of Care Note  Patient: Jonathan [name]  Procedure(s) Performed: Procedure(s): ESOPHAGOGASTRODUODENOSCOPY (EGD) WITH PROPOFOL (N/A) BALLOON DILATION (N/A)  Patient Location: Endoscopy Unit  Anesthesia Type:MAC  Level of Consciousness: sedated  Airway & Oxygen Therapy: Patient Spontanous Breathing and Patient connected to nasal cannula oxygen  Post-op Assessment: Report given to RN, Post -op Vital signs reviewed and stable and Patient moving all extremities X 4  Post vital signs: Reviewed and stable  Last Vitals:  Vitals:   02/13/17 0827 02/13/17 1213  BP: 133/88 (!) 92/52  Pulse: 73 (!) 56  Resp: 17 12  Temp: 36.7 C     Last Pain:  Vitals:   02/13/17 1213  TempSrc: Oral         Complications: No apparent anesthesia complications

## 2017-02-13 NOTE — Anesthesia Preprocedure Evaluation (Signed)
Anesthesia Evaluation  Patient identified by MRN, date of birth, ID band Patient awake    Reviewed: Allergy & Precautions, H&P , NPO status , Patient's Chart, lab work & pertinent test results  Airway Mallampati: II   Neck ROM: full    Dental   Pulmonary neg pulmonary ROS,    breath sounds clear to auscultation       Cardiovascular hypertension, + CAD and + Past MI   Rhythm:regular Rate:Normal     Neuro/Psych    GI/Hepatic GERD  ,  Endo/Other    Renal/GU      Musculoskeletal  (+) Arthritis ,   Abdominal   Peds  Hematology   Anesthesia Other Findings   Reproductive/Obstetrics                             Anesthesia Physical Anesthesia Plan  ASA: III  Anesthesia Plan: MAC   Post-op Pain Management:    Induction: Intravenous  Airway Management Planned: Nasal Cannula  Additional Equipment:   Intra-op Plan:   Post-operative Plan:   Informed Consent: I have reviewed the patients History and Physical, chart, labs and discussed the procedure including the risks, benefits and alternatives for the proposed anesthesia with the patient or authorized representative who has indicated his/her understanding and acceptance.     Plan Discussed with: CRNA, Anesthesiologist and Surgeon  Anesthesia Plan Comments:         Anesthesia Quick Evaluation

## 2017-02-13 NOTE — Op Note (Signed)
[facility] Patient Name: Jonathan [name] Procedure Date : 02/13/2017 MRN: [id number] Attending MD: Arta Silence , MD Date of Birth: 22-May-1931 CSN: [id number] Age: 83 Admit Type: Outpatient Procedure:                Upper GI endoscopy Indications:              Dysphagia Providers:                Arta Silence, MD, Cleda Daub, RN, Tinnie Gens,                            Technician, Cletis Athens, Technician Referring MD:              Medicines:                Monitored Anesthesia Care Complications:            No immediate complications. Estimated Blood Loss:     Estimated blood loss was minimal. Procedure:                Pre-Anesthesia Assessment:                           - Prior to the procedure, a History and Physical                            was performed, and patient medications and                            allergies were reviewed. The patient's tolerance of                            previous anesthesia was also reviewed. The risks                            and benefits of the procedure and the sedation                            options and risks were discussed with the patient.                            All questions were answered, and informed consent                            was obtained. Prior Anticoagulants: The patient has                            taken no previous anticoagulant or antiplatelet                            agents. ASA Grade Assessment: III - A patient with                            severe systemic disease. After reviewing the risks  and benefits, the patient was deemed in                            satisfactory condition to undergo the procedure.                           After obtaining informed consent, the endoscope was                            passed under direct vision. Throughout the                            procedure, the patient's blood pressure, pulse, and                            oxygen  saturations were monitored continuously. The                            EG-[id number] ([id number]) scope was introduced through the                            mouth, and advanced to the second part of duodenum.                            The upper GI endoscopy was accomplished without                            difficulty. The patient tolerated the procedure                            well. Scope In: Scope Out: Findings:      One moderate benign-appearing, intrinsic stenosis was found. A TTS       dilator was passed through the scope. Dilation with a 12-13.5-15 mm       balloon dilator was performed to 10 mm, 11 mm, 12 mm and 14 mm. The       dilation site was examined and showed mild improvement in luminal       narrowing. Estimated blood loss was minimal.      The examined esophagus was significantly tortuous.      A small hiatal hernia was present.      The exam of the esophagus was otherwise normal.      The entire examined stomach was normal.      The duodenal bulb, first portion of the duodenum and second portion of       the duodenum were normal. Impression:               - Benign-appearing esophageal stenosis. Dilated.                           - Tortuous esophagus.                           - Small hiatal hernia.                           -  Normal stomach.                           - Normal duodenal bulb, first portion of the                            duodenum and second portion of the duodenum.                           - Atrial fibrillation, rate-controlled, no                            symptoms, noted pre- and post-procedurally. Moderate Sedation:      None Recommendation:           - Patient has a contact number available for                            emergencies. The signs and symptoms of potential                            delayed complications were discussed with the                            patient. Return to normal activities tomorrow.                             Written discharge instructions were provided to the                            patient.                           - Discharge patient to home (via wheelchair).                           - Soft diet until further notice.                           - Continue present medications.                           - Return to GI clinic in 6 weeks.                           - I have spoken with Dr. Acie Fredrickson, who will arrange                            expedited outpatient follow-up for patient's atrial                            fibrillation.                           - Follow clinical response to esophageal dilatation.                           -  Return to referring physician as previously                            scheduled. Procedure Code(s):        --- Professional ---                           [phone], Esophagogastroduodenoscopy, flexible,                            transoral; with transendoscopic balloon dilation of                            esophagus (less than 30 mm diameter) Diagnosis Code(s):        --- Professional ---                           K22.2, Esophageal obstruction                           Q39.9, Congenital malformation of esophagus,                            unspecified                           K44.9, Diaphragmatic hernia without obstruction or                            gangrene                           R13.10, Dysphagia, unspecified CPT copyright 2016 [employer]. All rights reserved. The codes documented in this report are preliminary and upon coder review may  be revised to meet current compliance requirements. Arta Silence, MD 02/13/2017 12:17:16 PM This report has been signed electronically. Number of Addenda: 0

## 2017-02-13 NOTE — Telephone Encounter (Signed)
Phone call from Dr. Paulita Fujita today. Jonathan [name] has an esophageal dilatation and was noted to have atrial fibrillation  on tele  following the procedure.  He is very stable. His heart rate is around 60.  We'll get him worked in the office in the next several days. Dr. Paulita Fujita gave the okay to start anticoagulation after 3-4 days.    Mertie Moores, MD  02/13/2017 12:15 PM    [facility] [place],  [place], [place]  [address] Pager [phone] Phone: [phone]; Fax: [phone]

## 2017-02-13 NOTE — H&P (Signed)
Patient interval history reviewed.  Patient examined again.  There has been no change from documented H/P dated  (scanned into chart from our office) except as documented above  Assessment:  1.  Dysphagia, history of distal esophageal stricture.  Plan:  1.  Endoscopy with possible esophageal balloon dilatation. 2.  Risks (bleeding, infection, bowel perforation that could require surgery, sedation-related changes in cardiopulmonary systems), benefits (identification and possible treatment of source of symptoms, exclusion of certain causes of symptoms), and alternatives (watchful waiting, radiographic imaging studies, empiric medical treatment) of upper endoscopy with possible esophageal balloon dilatation (EGD +/- DIL) were explained to patient/family in detail and patient wishes to proceed.

## 2017-02-13 NOTE — Telephone Encounter (Signed)
Patient is scheduled for 5/4 at 0900

## 2017-02-14 ENCOUNTER — Encounter: Payer: Self-pay | Admitting: Cardiovascular Disease

## 2017-02-14 ENCOUNTER — Ambulatory Visit (INDEPENDENT_AMBULATORY_CARE_PROVIDER_SITE_OTHER): Payer: Medicare Other | Admitting: Cardiovascular Disease

## 2017-02-14 VITALS — BP 140/82 | HR 77 | Ht 67.0 in | Wt 180.0 lb

## 2017-02-14 DIAGNOSIS — I2510 Atherosclerotic heart disease of native coronary artery without angina pectoris: Secondary | ICD-10-CM

## 2017-02-14 DIAGNOSIS — I10 Essential (primary) hypertension: Secondary | ICD-10-CM

## 2017-02-14 DIAGNOSIS — I480 Paroxysmal atrial fibrillation: Secondary | ICD-10-CM | POA: Insufficient documentation

## 2017-02-14 DIAGNOSIS — I481 Persistent atrial fibrillation: Secondary | ICD-10-CM

## 2017-02-14 DIAGNOSIS — I4819 Other persistent atrial fibrillation: Secondary | ICD-10-CM

## 2017-02-14 LAB — CBC WITH DIFFERENTIAL/PLATELET
Basophils Absolute: 0 10*3/uL (ref 0.0–0.2)
Basos: 0 %
EOS (ABSOLUTE): 0.2 10*3/uL (ref 0.0–0.4)
EOS: 3 %
HEMATOCRIT: 44.5 % (ref 37.5–51.0)
Hemoglobin: 15.2 g/dL (ref 13.0–17.7)
Immature Grans (Abs): 0 10*3/uL (ref 0.0–0.1)
Immature Granulocytes: 0 %
Lymphocytes Absolute: 1.9 10*3/uL (ref 0.7–3.1)
Lymphs: 24 %
MCH: 29.3 pg (ref 26.6–33.0)
MCHC: 34.2 g/dL (ref 31.5–35.7)
MCV: 86 fL (ref 79–97)
MONOS ABS: 0.4 10*3/uL (ref 0.1–0.9)
Monocytes: 5 %
NEUTROS PCT: 68 %
Neutrophils Absolute: 5.5 10*3/uL (ref 1.4–7.0)
Platelets: 227 10*3/uL (ref 150–379)
RBC: 5.19 x10E6/uL (ref 4.14–5.80)
RDW: 13.8 % (ref 12.3–15.4)
WBC: 8 10*3/uL (ref 3.4–10.8)

## 2017-02-14 LAB — BASIC METABOLIC PANEL
BUN/Creatinine Ratio: 16 (ref 10–24)
BUN: 16 mg/dL (ref 8–27)
CHLORIDE: 102 mmol/L (ref 96–106)
CO2: 22 mmol/L (ref 18–29)
Calcium: 9.2 mg/dL (ref 8.6–10.2)
Creatinine, Ser: 0.99 mg/dL (ref 0.76–1.27)
GFR calc Af Amer: 81 mL/min/{1.73_m2} (ref >59)
GFR calc non Af Amer: 70 mL/min/{1.73_m2} (ref >59)
GLUCOSE: 117 mg/dL — AB (ref 65–99)
Potassium: 4.5 mmol/L (ref 3.5–5.2)
SODIUM: 143 mmol/L (ref 134–144)

## 2017-02-14 LAB — TSH: TSH: 1.08 u[IU]/mL (ref 0.450–4.500)

## 2017-02-14 MED ORDER — APIXABAN 5 MG PO TABS: 5.0000 mg | ORAL_TABLET | Freq: Two times a day (BID) | ORAL | 1 refills | Status: DC

## 2017-02-14 NOTE — Patient Instructions (Addendum)
Medication Instructions:  Stop aspirin.  Start Eliquis 5mg  two times a day.  Labwork: BMET/CBCd/TSH today.  Testing/Procedures: Your physician has requested that you have an echocardiogram. Echocardiography is a painless test that uses sound waves to create images of your heart. It provides your doctor with information about the size and shape of your heart and how well your heart's chambers and valves are working. This procedure takes approximately one hour. There are no restrictions for this procedure.    Follow-Up: Your physician recommends that you schedule a follow-up appointment in: 1 month with PA/NP.    Any Other Special Instructions Will Be Listed Below (If Applicable).  Your physician has recommended that you have a Cardioversion (DCCV). Electrical Cardioversion uses a jolt of electricity to your heart either through paddles or wired patches attached to your chest. This is a controlled, usually prescheduled, procedure. Defibrillation is done under light anesthesia in the hospital, and you usually go home the day of the procedure. This is done to get your heart back into a normal rhythm. You are not awake for the procedure. Please see the instruction sheet given to you today.  The PA/NP will discuss this with you when you come back in 1 month.   If you need a refill on your cardiac medications before your next appointment, please call your pharmacy.

## 2017-02-14 NOTE — Anesthesia Postprocedure Evaluation (Signed)
Anesthesia Post Note  Patient: Jonathan [name]  Procedure(s) Performed: Procedure(s) (LRB): ESOPHAGOGASTRODUODENOSCOPY (EGD) WITH PROPOFOL (N/A) BALLOON DILATION (N/A)  Patient location during evaluation: PACU Anesthesia Type: MAC Level of consciousness: awake and alert Pain management: pain level controlled Vital Signs Assessment: post-procedure vital signs reviewed and stable Respiratory status: spontaneous breathing, nonlabored ventilation, respiratory function stable and patient connected to nasal cannula oxygen Cardiovascular status: stable and blood pressure returned to baseline Anesthetic complications: no       Last Vitals:  Vitals:   02/13/17 1235 02/13/17 1245  BP: (!) 131/55 136/70  Pulse: (!) 54 (!) 59  Resp: 15 11  Temp:      Last Pain:  Vitals:   02/13/17 1213  TempSrc: Oral                 Micky Sheller S

## 2017-02-14 NOTE — Progress Notes (Signed)
Cardiology Office Note   Date:  02/14/2017   ID:  Jonathan [name], DOB Oct 24, 1930, MRN [id number]  PCP:  Henrine Screws, MD  Cardiologist:   Mertie Moores, MD   Chief Complaint  Patient presents with  . Atrial Fibrillation    evaluate a-fib     Problem list 1. Atypical chest pain 2. extreme fatigue  3. essential hypertension 4.  Atrial fibrillation  CHADS2VASC = 4   ( age 83, CAD, HTN) 5.  CAD -    History of Present Illness: Jonathan [name] is a 83 y.o. male who presents for atypical CP and extreme fatigue. Feels worn out all of the time .  TSH was checked and was reportedly ok.  Had a cardiac cath 20 years ago Jonathan [name] )  - has a chronic total occlusion that was treated medically  Had a 2nd cath about 7 years later that was unchanged.  Is retired from truck driving, Chartered loss adjuster, Estate agent.   Nov. 15, 2016:  Rollin was seen several months ago for some episodes of chest discomfort. He has known coronary artery disease. He had a [place] study which was normal.  Feb 14, 2017:  Jonathan [name] is seen back today for newly diagnosed atrial fib which was seen during an endoscopy and esophageal dilatation several days ago.  He is still in atrial fib - cannot tel that his HR is irregularly Has had generalized fatigue for the past year  .  He was seen for an esophageal dilatation several days ago. He was noted have an irregular heart rate in the monitor showed atrial fibrillation. He presents today for further evaluation.  No CP.   Still able to do his normal yard work  without any chest pain or shortness of breath.    Past Medical History:  Diagnosis Date  . Arthritis   . Borderline diabetes   . Esophageal stricture   . GERD (gastroesophageal reflux disease)   . Hypertension   . Myocardial infarction ([place])   . Neck fracture ([place]) 2015    Past Surgical History:  Procedure Laterality Date  . BALLOON DILATION  04/02/2012   Procedure: BALLOON DILATION;   Surgeon: Arta Silence, MD;  [facility]: [facility];  Service: Endoscopy;  Laterality: N/A;  . BALLOON DILATION N/A 06/16/2013   Procedure: BALLOON DILATION;  Surgeon: Arta Silence, MD;  [facility];  Service: Endoscopy;  Laterality: N/A;  . BALLOON DILATION N/A 11/16/2014   Procedure: BALLOON DILATION;  Surgeon: Arta Silence, MD;  [facility];  Service: Endoscopy;  Laterality: N/A;  . CORNEAL TRANSPLANT    . ESOPHAGOGASTRODUODENOSCOPY  04/02/2012   Procedure: ESOPHAGOGASTRODUODENOSCOPY (EGD);  Surgeon: Arta Silence, MD;  Location: [facility];  Service: Endoscopy;  Laterality: N/A;  . ESOPHAGOGASTRODUODENOSCOPY N/A 05/28/2013   Procedure: ESOPHAGOGASTRODUODENOSCOPY (EGD);  Surgeon: Cleotis Nipper, MD;  Location: [facility];  Service: Endoscopy;  Laterality: N/A;  . ESOPHAGOGASTRODUODENOSCOPY N/A 02/20/2015   Procedure: ESOPHAGOGASTRODUODENOSCOPY (EGD);  Surgeon: Arta Silence, MD;  Location: [facility];  Service: Endoscopy;  Laterality: N/A;  . ESOPHAGOGASTRODUODENOSCOPY (EGD) WITH PROPOFOL N/A 06/16/2013   Procedure: ESOPHAGOGASTRODUODENOSCOPY (EGD) WITH PROPOFOL;  Surgeon: Arta Silence, MD;  [facility];  Service: Endoscopy;  Laterality: N/A;  . ESOPHAGOGASTRODUODENOSCOPY (EGD) WITH PROPOFOL N/A 11/16/2014   Procedure: ESOPHAGOGASTRODUODENOSCOPY (EGD) WITH PROPOFOL;  Surgeon: Arta Silence, MD;  [facility];  Service: Endoscopy;  Laterality: N/A;  . EYE SURGERY     cataract sx  . HERNIA REPAIR     x  2  . TOTAL HIP ARTHROPLASTY     bilateral     Current Outpatient Prescriptions  Medication Sig Dispense Refill  . amLODipine-atorvastatin (CADUET) 5-10 MG tablet     . aspirin 81 MG tablet Take 81 mg by mouth every evening.    . captopril (CAPOTEN) 50 MG tablet Take 50 mg by mouth 2 (two) times daily.     . finasteride (PROSCAR) 5 MG tablet Take 5 mg by mouth every morning.     . halobetasol (ULTRAVATE) 0.05 % ointment Apply topically 2  (two) times daily. APPLY TWICE DAILY TO HANDS  0  . isosorbide mononitrate (IMDUR) 30 MG 24 hr tablet Take 2 tablets (60 mg total) by mouth daily. 60 tablet 11  . NITROSTAT 0.4 MG SL tablet Take 0.4 mg by mouth as needed. AS NEEDED FOR CHEST PAIN  0  . omeprazole (PRILOSEC) 20 MG capsule Take 20 mg by mouth daily.     No current facility-administered medications for this visit.     Allergies:   Patient has no known allergies.    Social History:  The patient  reports that he has never smoked. He has never used smokeless tobacco. He reports that he does not drink alcohol or use drugs.   Family History:  The patient's Family history is unknown by patient.    ROS:  Please see the history of present illness.    Review of Systems: Constitutional:  admits to fatigue.  HEENT: denies photophobia, eye pain, redness, hearing loss, ear pain, congestion, sore throat, rhinorrhea, sneezing, neck pain, neck stiffness and tinnitus.  Respiratory: denies SOB, DOE, cough, chest tightness, and wheezing.  Cardiovascular: denies chest pain, palpitations and leg swelling.  Gastrointestinal: denies nausea, vomiting, abdominal pain, diarrhea, constipation, blood in stool.  Genitourinary: denies dysuria, urgency, frequency, hematuria, flank pain and difficulty urinating.  Musculoskeletal: denies  myalgias, back pain, joint swelling, arthralgias and gait problem.   Skin: denies pallor, rash and wound.  Neurological: denies dizziness, seizures, syncope, weakness, light-headedness, numbness and headaches.   Hematological: denies adenopathy, easy bruising, personal or family bleeding history.  Psychiatric/ Behavioral: denies suicidal ideation, mood changes, confusion, nervousness, sleep disturbance and agitation.       All other systems are reviewed and negative.    PHYSICAL EXAM: VS:  BP 140/82   Pulse 77   Ht 5\' 7"  (1.702 m)   Wt 180 lb (81.6 kg)   SpO2 98%   BMI 28.19 kg/m  , BMI Body mass index is  28.19 kg/m. GEN: Well nourished, well developed, in no acute distress  HEENT: normal  Neck: no JVD, carotid bruits, or masses Cardiac: RRR; no murmurs, rubs, or gallops,no edema  Respiratory:  clear to auscultation bilaterally, normal work of breathing GI: soft, nontender, nondistended, + BS MS: no deformity or atrophy  Skin: warm and dry, no rash Neuro:  Strength and sensation are intact Psych: normal   EKG:  EKG is ordered today. The ekg ordered Feb 14, 2017:  Atrial fib with rate of 74.   NS ST abn.    Recent Labs: No results found for requested labs within last 8760 hours.    Lipid Panel No results found for: CHOL, TRIG, HDL, CHOLHDL, VLDL, LDLCALC, LDLDIRECT    Wt Readings from Last 3 Encounters:  02/14/17 180 lb (81.6 kg)  02/13/17 180 lb (81.6 kg)  10/16/16 192 lb (87.1 kg)      Other studies Reviewed: Additional studies/ records that were reviewed today include: .  Review of the above records demonstrates:    ASSESSMENT AND PLAN:  1. Atrial fib:    He was recently diagnosed with atrial fibrillation. He basically is asymptomatic and cannot tell that his heart rate is irregular. He does have some fatigue but this is a very old issue.  We discussed the fact that patients can live with atrial fibrillation but that it does pose a risk of stroke. His CHADS2VASC score is 4.  We will start him on Eliquis  5 mg twice a day. We will repeat his echocardiogram. We'll check a basic medical profile, CBC, TSH.  We will have him return in one month to see me or a mid-level provider to set up cardioversion assuming that he is still in A. fib.    2.  Coronary artery disease:   Patient has a history of a tight first diagonal stenosis. He was cathed approximate 10 years ago by Dr. Ilda Foil. His EKG shows abdomen old inferior wall myocardial infarction.  His Myoview study showed no perfusion abnormalities. His left ventricle systolic function is normal with an EF of 58%.  3.  Generalized  Fatigue: He's having fatigue and shortness breath.  He has no evidence of ischemia.    4. Premature ventricular contractions.   Current medicines are reviewed at length with the patient today.  The patient does not have concerns regarding medicines.  The following changes have been made:  no change  Labs/ tests ordered today include:  No orders of the defined types were placed in this encounter.   Disposition:   FU with [place] in 1 month to set up cardioversion   Mertie Moores, MD  02/14/2017 9:21 AM    [address], [place], [place]  [address] Phone: [phone]; Fax: [phone]

## 2017-02-17 ENCOUNTER — Encounter (HOSPITAL_COMMUNITY): Payer: Self-pay | Admitting: Gastroenterology

## 2017-02-18 ENCOUNTER — Other Ambulatory Visit: Payer: Self-pay | Admitting: *Deleted

## 2017-02-18 ENCOUNTER — Telehealth: Payer: Self-pay | Admitting: Nurse Practitioner

## 2017-02-18 ENCOUNTER — Other Ambulatory Visit: Payer: Self-pay

## 2017-02-18 ENCOUNTER — Ambulatory Visit (HOSPITAL_COMMUNITY): Payer: Medicare Other | Attending: Cardiovascular Disease

## 2017-02-18 DIAGNOSIS — I2510 Atherosclerotic heart disease of native coronary artery without angina pectoris: Secondary | ICD-10-CM | POA: Insufficient documentation

## 2017-02-18 DIAGNOSIS — I10 Essential (primary) hypertension: Secondary | ICD-10-CM | POA: Diagnosis not present

## 2017-02-18 DIAGNOSIS — I481 Persistent atrial fibrillation: Secondary | ICD-10-CM | POA: Insufficient documentation

## 2017-02-18 DIAGNOSIS — I340 Nonrheumatic mitral (valve) insufficiency: Secondary | ICD-10-CM | POA: Diagnosis not present

## 2017-02-18 DIAGNOSIS — I4819 Other persistent atrial fibrillation: Secondary | ICD-10-CM

## 2017-02-18 NOTE — Telephone Encounter (Signed)
Medication list brought into office and updates entered into patient's record

## 2017-02-18 NOTE — Addendum Note (Signed)
Addended by: Clarnce Flock E on: 02/18/2017 03:29 PM   Modules accepted: Orders

## 2017-02-20 ENCOUNTER — Encounter: Payer: Self-pay | Admitting: Physician Assistant

## 2017-02-28 ENCOUNTER — Telehealth: Payer: Self-pay | Admitting: Cardiovascular Disease

## 2017-02-28 NOTE — Telephone Encounter (Signed)
Called, spoke with pt. Reviewed recent echo results.   Pt stated he has questions regarding cardioversion. Informed last office note states cardioversion will be discussed at next office visit on 03/17/17. Pt verbalized understanding.

## 2017-02-28 NOTE — Telephone Encounter (Signed)
Fu Message ° °Pt states he is returning RN call. Please call back to discuss  °

## 2017-03-13 DIAGNOSIS — I4891 Unspecified atrial fibrillation: Secondary | ICD-10-CM | POA: Diagnosis not present

## 2017-03-13 DIAGNOSIS — R1310 Dysphagia, unspecified: Secondary | ICD-10-CM | POA: Diagnosis not present

## 2017-03-16 NOTE — Progress Notes (Signed)
Cardiology Office Note:    Date:  03/17/2017   ID:  Jonathan [name], DOB [birth year], MRN [id number]  PCP:  Josetta Huddle, MD  Cardiologist:  Dr. Liam Rogers    Referring MD: Josetta Huddle, MD   Chief Complaint  Patient presents with  . Atrial Fibrillation    follow up    History of Present Illness:    Jonathan [name] is a 83 y.o. male with a hx of CAD, HTN, borderline DM.  He was recently seen by Dr. Liam Rogers 02/14/17 for evaluation of atrial fibrillation noted during an endoscopic procedure.  He was placed on Apixaban for anticoagulation.    CHADS2-VASc=4 (83 yo, HTN, CAD).    [name] returns for follow up on atrial fibrillation with an eye towards DCCV.   He is here today with his wife. His biggest complaint is fatigue. This has been ongoing for 2 years. It seems to be getting worse. He denies chest discomfort. He does note dyspnea with mild to moderate activities. He denies orthopnea, PND or edema. He denies syncope or near syncope.  Prior CV studies:   The following studies were reviewed today:  Echo 02/18/17 EF 55-60, no RWMA, Gr 2 DD, trivial AI, MAC, mild MR, severe LAE, mild RAE, PASP 40  Myoview 07/2015 EF 55-65, normal study; low risk  LHC 11/2004 AL HK, EF 60, 1-2+ MR LAD prox 20-30, Dx ostial 90 LCx prox 30 RCA prox 20-30  Past Medical History:  Diagnosis Date  . Arthritis   . Borderline diabetes   . Esophageal stricture   . GERD (gastroesophageal reflux disease)   . Hypertension   . Myocardial infarction ([place])   . Neck fracture ([place]) 2015    Past Surgical History:  Procedure Laterality Date  . BALLOON DILATION  04/02/2012   Procedure: BALLOON DILATION;  Surgeon: Arta Silence, MD;  [facility];  Service: Endoscopy;  Laterality: N/A;  . BALLOON DILATION N/A 06/16/2013   Procedure: BALLOON DILATION;  Surgeon: Arta Silence, MD;  [facility];  Service: Endoscopy;  Laterality: N/A;  . BALLOON DILATION N/A 11/16/2014   Procedure:  BALLOON DILATION;  Surgeon: Arta Silence, MD;  [facility];  Service: Endoscopy;  Laterality: N/A;  . BALLOON DILATION N/A 02/13/2017   Procedure: BALLOON DILATION;  Surgeon: Arta Silence, MD;  Location[facility];  Service: Endoscopy;  Laterality: N/A;  . CORNEAL TRANSPLANT    . ESOPHAGOGASTRODUODENOSCOPY  04/02/2012   Procedure: ESOPHAGOGASTRODUODENOSCOPY (EGD);  Surgeon: Arta Silence, MD;  Location: [facility];  Service: Endoscopy;  Laterality: N/A;  . ESOPHAGOGASTRODUODENOSCOPY N/A 05/28/2013   Procedure: ESOPHAGOGASTRODUODENOSCOPY (EGD);  Surgeon: Cleotis Nipper, MD;  Location: [facility];  Service: Endoscopy;  Laterality: N/A;  . ESOPHAGOGASTRODUODENOSCOPY N/A 02/20/2015   Procedure: ESOPHAGOGASTRODUODENOSCOPY (EGD);  Surgeon: Arta Silence, MD;  Location: [facility];  Service: Endoscopy;  Laterality: N/A;  . ESOPHAGOGASTRODUODENOSCOPY (EGD) WITH PROPOFOL N/A 06/16/2013   Procedure: ESOPHAGOGASTRODUODENOSCOPY (EGD) WITH PROPOFOL;  Surgeon: Arta Silence, MD;  [facility];  Service: Endoscopy;  Laterality: N/A;  . ESOPHAGOGASTRODUODENOSCOPY (EGD) WITH PROPOFOL N/A 11/16/2014   Procedure: ESOPHAGOGASTRODUODENOSCOPY (EGD) WITH PROPOFOL;  Surgeon: Arta Silence, MD;  [facility];  Service: Endoscopy;  Laterality: N/A;  . ESOPHAGOGASTRODUODENOSCOPY (EGD) WITH PROPOFOL N/A 02/13/2017   Procedure: ESOPHAGOGASTRODUODENOSCOPY (EGD) WITH PROPOFOL;  Surgeon: Arta Silence, MD;  Location: [facility];  Service: Endoscopy;  Laterality: N/A;  . EYE SURGERY     cataract sx  . HERNIA REPAIR     x 2  .  TOTAL HIP ARTHROPLASTY     bilateral    Current Medications: Current Meds  Medication Sig  . amLODipine-atorvastatin (CADUET) 5-10 MG tablet Take 1 tablet by mouth daily.   Marland Kitchen apixaban (ELIQUIS) 5 MG TABS tablet Take 1 tablet (5 mg total) by mouth 2 (two) times daily.  . captopril (CAPOTEN) 25 MG tablet Take 25 mg by mouth at bedtime.   . finasteride  (PROSCAR) 5 MG tablet Take 5 mg by mouth every morning.   . halobetasol (ULTRAVATE) 0.05 % ointment Apply topically 2 (two) times daily. APPLY TWICE DAILY TO HANDS  . isosorbide mononitrate (IMDUR) 30 MG 24 hr tablet Take 30 mg by mouth daily.  Marland Kitchen NITROSTAT 0.4 MG SL tablet Take 0.4 mg by mouth as needed. AS NEEDED FOR CHEST PAIN  . omeprazole (PRILOSEC) 20 MG capsule Take 20 mg by mouth daily.     Allergies:   Patient has no known allergies.   Social History   Social History  . Marital status: Married    Spouse name: N/A  . Number of children: N/A  . Years of education: N/A   Social History Main Topics  . Smoking status: Never Smoker  . Smokeless tobacco: Never Used  . Alcohol use No  . Drug use: No  . Sexual activity: Not Asked   Other Topics Concern  . None   Social History Narrative  . None     Family Hx: The patient's Family history is unknown by patient.  ROS:   Please see the history of present illness.    ROS All other systems reviewed and are negative.   EKGs/Labs/Other Test Reviewed:    EKG:  EKG is   ordered today.  The ekg ordered today demonstrates NSR, HR 61, normal axis, nonspecific ST-T wave changes, inferior Q waves, QTc 386 ms  Recent Labs: 02/14/2017: TSH 1.080 03/17/2017: BUN 16; Creatinine, Ser 0.93; Hemoglobin WILL FOLLOW; Platelets WILL FOLLOW; Potassium 4.4; Sodium 140   Recent Lipid Panel No results found for: CHOL, TRIG, HDL, CHOLHDL, LDLCALC, LDLDIRECT  Physical Exam:    VS:  BP 130/60   Pulse 61   Ht _0  (1.702 m)   Wt 176 lb 1.9 oz (79.9 kg)   BMI 27.58 kg/m     Wt Readings from Last 3 Encounters:  03/17/17 176 lb 1.9 oz (79.9 kg)  02/14/17 180 lb (81.6 kg)  02/13/17 180 lb (81.6 kg)     Physical Exam  Constitutional: He is oriented to person, place, and time. He appears well-developed and well-nourished. No distress.  HENT:  Head: Normocephalic and atraumatic.  Eyes: No scleral icterus.  Neck: Normal range of motion. No  JVD present.  Cardiovascular: Normal rate, regular rhythm, S1 normal and S2 normal.   No murmur heard. Pulmonary/Chest: Effort normal and breath sounds normal. He has no wheezes. He has no rhonchi. He has no rales.  Abdominal: Soft. There is no tenderness.  Musculoskeletal: He exhibits no edema.  Neurological: He is alert and oriented to person, place, and time.  Skin: Skin is warm and dry.  Psychiatric: He has a normal mood and affect.    ASSESSMENT:    1. Persistent atrial fibrillation ([place])   2. Shortness of breath   3. Other fatigue   4. Coronary artery disease involving native coronary artery of native heart without angina pectoris   5. Essential hypertension    PLAN:    In order of problems listed above:  1. Persistent atrial fibrillation (  [place]) -  He is back in normal sinus rhythm.  His biggest complaint is fatigue.  He is s/w vague about this.  It is not clear if he feels better today vs the last time he was seen when he was in atrial fibrillation.  He also notes some shortness of breath.  He has a hx of CAD and has not had a stress test in almost 2 years.  Etiology not entirely clear for his fatigue.  It could be atrial fibrillation vs ischemia vs sleep apnea vs deconditioning.   -  Arrange event monitor  -  If symptoms match up with atrial fibrillation, consider anti-arrhythmic drug therapy  -  Consider sleep testing if workup unrevealing to check for obstructive sleep apnea.  2. Shortness of breath -  Plan: Myocardial Perfusion Imaging  3. Other fatigue -  As noted, etiology may be related to atrial fibrillation vs ischemia vs OSA vs deconditioning.  Obtain event monitor.  If symptoms coincide with atrial fibrillation, consider AAD therapy.  If workup unrevealing, he will need a sleep test.    4. Coronary artery disease involving native coronary artery of native heart without angina pectoris - He had 90% ostial stenosis in a diagonal in the past.  Last Nuclear stress  test in 2016.  Obtain Nuclear stress test as noted above.  He is not on ASA as he is on Apixaban.  Continue nitrates, statin.  5. Essential hypertension -  The patient's blood pressure is controlled on his current regimen.  Continue current therapy.     Dispo:  Return in about 10 weeks (around 05/26/2017) for Follow up after testing, w/ Dr. Acie Fredrickson or Richardson Dopp, PA-C.   Medication Adjustments/Labs and Tests Ordered: Current medicines are reviewed at length with the patient today.  Concerns regarding medicines are outlined above.  Orders/Tests:  Orders Placed This Encounter  Procedures  . CBC with Differential/Platelet  . Basic metabolic panel  . Myocardial Perfusion Imaging  . Cardiac event monitor  . EKG 12-Lead   Medication changes: No orders of the defined types were placed in this encounter.  Signed, Richardson Dopp, PA-C  03/17/2017 5:46 PM    [facility] [address], [place], [place]  [address] Phone: [phone]; Fax: [phone]

## 2017-03-17 ENCOUNTER — Telehealth (HOSPITAL_COMMUNITY): Payer: Self-pay | Admitting: *Deleted

## 2017-03-17 ENCOUNTER — Encounter: Payer: Self-pay | Admitting: Physician Assistant

## 2017-03-17 ENCOUNTER — Ambulatory Visit (INDEPENDENT_AMBULATORY_CARE_PROVIDER_SITE_OTHER): Payer: Medicare Other | Admitting: Physician Assistant

## 2017-03-17 VITALS — BP 130/60 | HR 61 | Ht 67.0 in | Wt 176.1 lb

## 2017-03-17 DIAGNOSIS — I2510 Atherosclerotic heart disease of native coronary artery without angina pectoris: Secondary | ICD-10-CM

## 2017-03-17 DIAGNOSIS — R0602 Shortness of breath: Secondary | ICD-10-CM

## 2017-03-17 DIAGNOSIS — R5383 Other fatigue: Secondary | ICD-10-CM

## 2017-03-17 DIAGNOSIS — I10 Essential (primary) hypertension: Secondary | ICD-10-CM

## 2017-03-17 DIAGNOSIS — I481 Persistent atrial fibrillation: Secondary | ICD-10-CM

## 2017-03-17 DIAGNOSIS — I4819 Other persistent atrial fibrillation: Secondary | ICD-10-CM

## 2017-03-17 NOTE — Telephone Encounter (Signed)
Patient given detailed instructions per Myocardial Perfusion Study Information Sheet for the test on 03/18/17 at 0745. Patient notified to arrive 15 minutes early and that it is imperative to arrive on time for appointment to keep from having the test rescheduled.  If you need to cancel or reschedule your appointment, please call the office within 24 hours of your appointment. . Patient verbalized understanding.Shemuel Harkleroad, [place]

## 2017-03-17 NOTE — Telephone Encounter (Signed)
Left message on voicemail in reference to upcoming appointment scheduled for 03/18/17. Phone number given for a call back so details instructions can be given. [name], [place]

## 2017-03-17 NOTE — Patient Instructions (Signed)
Your physician recommends that you continue on your current medications as directed. Please refer to the Current Medication list given to you today.   Your physician recommends that you return for lab work in:  [place] has recommended that you wear an event monitor. Event monitors are medical devices that record the heart's electrical activity. Doctors most often [place] these monitors to diagnose arrhythmias. Arrhythmias are problems with the speed or rhythm of the heartbeat. The monitor is a small, portable device. You can wear one while you do your normal daily activities. This is usually used to diagnose what is causing palpitations/syncope (passing out).  [place] has requested that you have a lexiscan myoview. For further information please visit [email or web address]. Please follow instruction sheet, as given.  Your physician recommends that you schedule a follow-up appointment in:  [place] OFFICE

## 2017-03-18 ENCOUNTER — Ambulatory Visit (HOSPITAL_COMMUNITY): Payer: Medicare Other | Attending: Cardiovascular Disease

## 2017-03-18 ENCOUNTER — Telehealth: Payer: Self-pay | Admitting: *Deleted

## 2017-03-18 ENCOUNTER — Encounter: Payer: Self-pay | Admitting: Physician Assistant

## 2017-03-18 DIAGNOSIS — I4819 Other persistent atrial fibrillation: Secondary | ICD-10-CM

## 2017-03-18 DIAGNOSIS — I481 Persistent atrial fibrillation: Secondary | ICD-10-CM

## 2017-03-18 DIAGNOSIS — R0602 Shortness of breath: Secondary | ICD-10-CM

## 2017-03-18 DIAGNOSIS — I519 Heart disease, unspecified: Secondary | ICD-10-CM | POA: Diagnosis not present

## 2017-03-18 DIAGNOSIS — R5383 Other fatigue: Secondary | ICD-10-CM

## 2017-03-18 LAB — BASIC METABOLIC PANEL
BUN/Creatinine Ratio: 17 (ref 10–24)
BUN: 16 mg/dL (ref 8–27)
CALCIUM: 9 mg/dL (ref 8.6–10.2)
CHLORIDE: 104 mmol/L (ref 96–106)
CO2: 21 mmol/L (ref 18–29)
Creatinine, Ser: 0.93 mg/dL (ref 0.76–1.27)
GFR calc non Af Amer: 76 mL/min/{1.73_m2} (ref >59)
GFR, EST AFRICAN AMERICAN: 87 mL/min/{1.73_m2} (ref >59)
GLUCOSE: 122 mg/dL — AB (ref 65–99)
POTASSIUM: 4.4 mmol/L (ref 3.5–5.2)
Sodium: 140 mmol/L (ref 134–144)

## 2017-03-18 LAB — CBC WITH DIFFERENTIAL/PLATELET
Basophils Absolute: 0.1 10*3/uL (ref 0.0–0.2)
Basos: 1 %
EOS (ABSOLUTE): 0.2 10*3/uL (ref 0.0–0.4)
EOS: 3 %
Hematocrit: 46.4 % (ref 37.5–51.0)
Hemoglobin: 15.7 g/dL (ref 13.0–17.7)
IMMATURE GRANS (ABS): 0 10*3/uL (ref 0.0–0.1)
IMMATURE GRANULOCYTES: 0 %
LYMPHS: 19 %
Lymphocytes Absolute: 1.6 10*3/uL (ref 0.7–3.1)
MCH: 28.8 pg (ref 26.6–33.0)
MCHC: 33.8 g/dL (ref 31.5–35.7)
MCV: 85 fL (ref 79–97)
MONOCYTES: 6 %
Monocytes Absolute: 0.5 10*3/uL (ref 0.1–0.9)
Neutrophils Absolute: 6.2 10*3/uL (ref 1.4–7.0)
Neutrophils: 71 %
PLATELETS: 194 10*3/uL (ref 150–379)
RBC: 5.46 x10E6/uL (ref 4.14–5.80)
RDW: 13.9 % (ref 12.3–15.4)
WBC: 8.7 10*3/uL (ref 3.4–10.8)

## 2017-03-18 LAB — MYOCARDIAL PERFUSION IMAGING
CHL CUP NUCLEAR SSS: 3
CSEPPHR: 74 {beats}/min
LVDIAVOL: 97 mL (ref 62–150)
LVSYSVOL: 40 mL
RATE: 0.3
Rest HR: 50 {beats}/min
SDS: 0
SRS: 3
TID: 0.98

## 2017-03-18 MED ORDER — REGADENOSON 0.4 MG/5ML IV SOLN
0.4000 mg | Freq: Once | INTRAVENOUS | Status: AC
Start: 2017-03-18 — End: 2017-03-18
  Administered 2017-03-18: 0.4 mg via INTRAVENOUS

## 2017-03-18 MED ORDER — TECHNETIUM TC 99M TETROFOSMIN IV KIT
32.1000 | PACK | Freq: Once | INTRAVENOUS | Status: AC | PRN
  Administered 2017-03-18: 32.1 via INTRAVENOUS
  Filled 2017-03-18: qty 33

## 2017-03-18 MED ORDER — TECHNETIUM TC 99M TETROFOSMIN IV KIT
10.6000 | PACK | Freq: Once | INTRAVENOUS | Status: AC | PRN
  Administered 2017-03-18: 10.6 via INTRAVENOUS
  Filled 2017-03-18: qty 11

## 2017-03-18 NOTE — Telephone Encounter (Signed)
-----   Message from Liliane Shi, [place] sent at 03/18/2017  4:57 PM EDT ----- Please call the patient. The stress test is ok.  There is no ischemia. Continue current management and planned follow up.   Please fax a copy of this study result to his PCP:  Josetta Huddle, MD  Thanks! Richardson Dopp, PA-C    03/18/2017 4:55 PM

## 2017-03-18 NOTE — Telephone Encounter (Signed)
Pt and his wife have been notified of lab results as well as Myoview results by phone with verbal understanding. I will fax a copy of results to PCP Dr. Inda Merlin. Pt's wife thanked me for the call today. Pt does need to fill out a DPR to have on file.

## 2017-03-20 ENCOUNTER — Ambulatory Visit (INDEPENDENT_AMBULATORY_CARE_PROVIDER_SITE_OTHER): Payer: Medicare Other

## 2017-03-20 DIAGNOSIS — I4819 Other persistent atrial fibrillation: Secondary | ICD-10-CM

## 2017-03-20 DIAGNOSIS — R5383 Other fatigue: Secondary | ICD-10-CM | POA: Diagnosis not present

## 2017-03-20 DIAGNOSIS — I481 Persistent atrial fibrillation: Secondary | ICD-10-CM | POA: Diagnosis not present

## 2017-04-02 ENCOUNTER — Telehealth: Payer: Self-pay | Admitting: *Deleted

## 2017-04-02 ENCOUNTER — Telehealth: Payer: Self-pay | Admitting: Cardiovascular Disease

## 2017-04-02 NOTE — Telephone Encounter (Signed)
Patient had skin reaction to [employer]/ [employer] MCT-1 patch cardiac event monitor.  Instructed to remove patch.  I have contacted [employer] to ship a MCT-3 monitor to their home with sensitive skin electrodes.  When they receive the monitor in 2-3 days, please call [employer] and they will go through a step by step process to start Mr. Geiman on the new monitor.  Instructed to continue to wear the new monitor through 04/19/17.

## 2017-04-02 NOTE — Telephone Encounter (Signed)
Patient wife calling, states that she had to take heart monitor off and was told to contact our office.

## 2017-04-14 DIAGNOSIS — Z947 Corneal transplant status: Secondary | ICD-10-CM | POA: Diagnosis not present

## 2017-04-14 DIAGNOSIS — Z961 Presence of intraocular lens: Secondary | ICD-10-CM | POA: Diagnosis not present

## 2017-04-16 ENCOUNTER — Telehealth: Payer: Self-pay | Admitting: Cardiology

## 2017-04-16 ENCOUNTER — Other Ambulatory Visit: Payer: Self-pay | Admitting: Cardiology

## 2017-04-16 MED ORDER — METOPROLOL TARTRATE 25 MG PO TABS: 25.0000 mg | ORAL_TABLET | Freq: Two times a day (BID) | ORAL | 11 refills | Status: DC

## 2017-04-16 NOTE — Telephone Encounter (Signed)
Pt had WCT 15 beats at 170 BPM pt was symptomatic with dizziness and lightheadedness discussed with Dr. Harrington Challenger and Dr. Lovena Le and will add lopressor 25 mg BID to meds. normal nuc recently ad normal EF.  Would be prudent to follow up in next week.

## 2017-04-16 NOTE — Progress Notes (Signed)
New med ordered see telephone message.

## 2017-04-17 NOTE — Telephone Encounter (Signed)
Spoke with pt wife, the patient is feeling fine this morning. Office visit scheduled for Monday 04-21-17 with APP.

## 2017-04-18 ENCOUNTER — Encounter: Payer: Self-pay | Admitting: Podiatry

## 2017-04-18 ENCOUNTER — Ambulatory Visit (INDEPENDENT_AMBULATORY_CARE_PROVIDER_SITE_OTHER): Payer: Medicare Other | Admitting: Podiatry

## 2017-04-18 DIAGNOSIS — B351 Tinea unguium: Secondary | ICD-10-CM | POA: Diagnosis not present

## 2017-04-18 DIAGNOSIS — M79676 Pain in unspecified toe(s): Secondary | ICD-10-CM

## 2017-04-18 NOTE — Progress Notes (Signed)
Patient ID: Jonathan [name], male   DOB: [birth year], 83 y.o.   MRN: [id number] Complaint:  Visit Type: Patient returns to my office for continued preventative foot care services. Complaint: Patient states" my nails have grown long and thick and become painful to walk and wear shoes". The patient presents for preventative foot care services. No changes to ROS  Podiatric Exam: Vascular: dorsalis pedis and posterior tibial pulses are palpable bilateral. Capillary return is immediate. Temperature gradient is WNL. Skin turgor WNL  Sensorium: Normal Semmes Weinstein monofilament test. Normal tactile sensation bilaterally. Nail Exam: Pt has thick disfigured discolored nails with subungual debris noted bilateral entire nail hallux through fifth toenails Ulcer Exam: There is no evidence of ulcer or pre-ulcerative changes or infection. Orthopedic Exam: Muscle tone and strength are WNL. No limitations in general ROM. No crepitus or effusions noted. Foot type and digits show no abnormalities. Bony prominences are unremarkable. Skin: No Porokeratosis. No infection or ulcers  Diagnosis:  Onychomycosis, , Pain in right toe, pain in left toes  Treatment & Plan Procedures and Treatment: Consent by patient was obtained for treatment procedures. The patient understood the discussion of treatment and procedures well. All questions were answered thoroughly reviewed. Debridement of mycotic and hypertrophic toenails, 1 through 5 bilateral and clearing of subungual debris. No ulceration, no infection noted.  Return Visit-Office Procedure: Patient instructed to return to the office for a follow up visit 3 months for continued evaluation and treatment.   Gardiner Barefoot DPM

## 2017-04-19 NOTE — Progress Notes (Signed)
Cardiology Office Note    Date:  04/21/2017   ID:  Jonathan [name], DOB 13-Aug-1931, MRN [id number]  PCP:  Josetta Huddle, MD  Cardiologist: Dr. Acie Fredrickson   CC: 15 beats WCT noted on cardiac monitor   History of Present Illness:  Jonathan [name] is a 83 y.o. male with a history of CAD, HTN, borderline DM, and PAF who presents to clinic for follow up.    He has a history of single vessel CAD. LHC in 2001 revealed a 90% stenosis at the origin of a first diagonal branch. He was treated medically. Repeat LHC in 2006 showed stable CAD and he was continued on medical therapy. That cath note mentions avoiding BB's give patients extreme daytime fatigue. He had a sleep study around that time that showed mild OSA.   He was recently seen by Dr. Liam Rogers 02/14/17 for evaluation of atrial fibrillation noted during an endoscopic dilation. He was placed on Apixaban for anticoagulation. CHADS2-VASc=4 (83 yo, HTN, CAD). Plan was to bring back in 4 weeks later and set up for DCCV if he remained in afib. 2D ECHO 02/28/17 showed normal LVEF, G2DD, severe LAE, mild RAE, PA pressure 40.   He was seen in the office by Richardson Dopp PA-C on 03/17/17. He had spontaneously converted to NSR. He still complained of profound fatigue as well as DOE. A 30 day event monitor was placed as well as a myoview. Myoview 03/18/17 was overall low risk with no ischemia identified. Cardiac monitor is still in progress but there have been some transmissions of at least one 15 beat run of WCT. The patient reported feeling dizzy during the episode but had no chest pain or shortness of breath. He was placed on Lopresor 19m BID and office visit was arranged for today.  The patient notes [place] [employer] [place] on [place]. He was anxious in trying to find something. He became lightheaded, but did not pass out, which passed and he continued on his shopping trip. He had no chest pain or shortness of breath. He has had one mildly  similar episode yesterday, not as bad. He still complains of fatigue, but he says that this is no better or worse than it has been for about 2 1/2 years. He can climb a flight of stairs at a normal pace, but not at a fast pace. He can wlk 150 feet up an incline in his yard at a slow to moderate pace, but not at a brisk pace.    Today he presents to clinic for follow up with his wife.    Past Medical History:  Diagnosis Date  . Arthritis   . Borderline diabetes   . Esophageal stricture   . GERD (gastroesophageal reflux disease)   . History of nuclear stress test    Nuclear stress test 6/18: EF 59, small apical defect (?old MI); no ischemia; Low Risk  . Hypertension   . Myocardial infarction ([place]   . [place]ck fracture ([place] 2015    Past Surgical History:  Procedure Laterality Date  . BALLOON DILATION  04/02/2012   Procedure: BALLOON DILATION;  Surgeon: WArta Silence MD;  Location: [facility]COPY;  Service: Endoscopy;  Laterality: N/A;  . BALLOON DILATION N/A 06/16/2013   Procedure: BALLOON DILATION;  Surgeon: WArta Silence MD;  Location: [facility];  Service: Endoscopy;  Laterality: N/A;  . BALLOON DILATION N/A 11/16/2014   Procedure: BALLOON DILATION;  Surgeon: WArta Silence MD;  Location:  [facility];  Service: Endoscopy;  Laterality: N/A;  . BALLOON DILATION N/A 02/13/2017   Procedure: BALLOON DILATION;  Surgeon: Arta Silence, MD;  Location[facility]COPY;  Service: Endoscopy;  Laterality: N/A;  . CORNEAL TRANSPLANT    . ESOPHAGOGASTRODUODENOSCOPY  04/02/2012   Procedure: ESOPHAGOGASTRODUODENOSCOPY (EGD);  Surgeon: Arta Silence, MD;  Location: [facility];  Service: Endoscopy;  Laterality: N/A;  . ESOPHAGOGASTRODUODENOSCOPY N/A 05/28/2013   Procedure: ESOPHAGOGASTRODUODENOSCOPY (EGD);  Surgeon: Cleotis Nipper, MD;  Location: [facility];  Service: Endoscopy;  Laterality: N/A;  . ESOPHAGOGASTRODUODENOSCOPY N/A 02/20/2015   Procedure: ESOPHAGOGASTRODUODENOSCOPY (EGD);  Surgeon:  Arta Silence, MD;  Location: [facility];  Service: Endoscopy;  Laterality: N/A;  . ESOPHAGOGASTRODUODENOSCOPY (EGD) WITH PROPOFOL N/A 06/16/2013   Procedure: ESOPHAGOGASTRODUODENOSCOPY (EGD) WITH PROPOFOL;  Surgeon: Arta Silence, MD;  Location: [facility];  Service: Endoscopy;  Laterality: N/A;  . ESOPHAGOGASTRODUODENOSCOPY (EGD) WITH PROPOFOL N/A 11/16/2014   Procedure: ESOPHAGOGASTRODUODENOSCOPY (EGD) WITH PROPOFOL;  Surgeon: Arta Silence, MD;  Location: [facility];  Service: Endoscopy;  Laterality: N/A;  . ESOPHAGOGASTRODUODENOSCOPY (EGD) WITH PROPOFOL N/A 02/13/2017   Procedure: ESOPHAGOGASTRODUODENOSCOPY (EGD) WITH PROPOFOL;  Surgeon: Arta Silence, MD;  Location: [place];  Service: Endoscopy;  Laterality: N/A;  . EYE SURGERY     cataract sx  . HERNIA REPAIR     x 2  . TOTAL HIP ARTHROPLASTY     bilateral    Current Medications: Outpatient Medications Prior to Visit  Medication Sig Dispense Refill  . captopril (CAPOTEN) 25 MG tablet Take 25 mg by mouth at bedtime.     . finasteride (PROSCAR) 5 MG tablet Take 5 mg by mouth every morning.     . halobetasol (ULTRAVATE) 0.05 % ointment Apply topically 2 (two) times daily. APPLY TWICE DAILY TO HANDS  0  . isosorbide mononitrate (IMDUR) 30 MG 24 hr tablet Take 30 mg by mouth daily.    . metoprolol tartrate (LOPRESSOR) 25 MG tablet Take 1 tablet (25 mg total) by mouth 2 (two) times daily. 60 tablet 11  . NITROSTAT 0.4 MG SL tablet Take 0.4 mg by mouth every 5 (five) minutes as needed for chest pain. AS NEEDED FOR CHEST PAIN  0  . omeprazole (PRILOSEC) 20 MG capsule Take 20 mg by mouth daily.    Marland Kitchen amLODipine-atorvastatin (CADUET) 5-10 MG tablet Take 1 tablet by mouth daily.     Marland Kitchen apixaban (ELIQUIS) 5 MG TABS tablet Take 1 tablet (5 mg total) by mouth 2 (two) times daily. 60 tablet 1   No facility-administered medications prior to visit.      Allergies:   Patient has no known allergies.   Social History   Social History    . Marital status: Married    Spouse name: N/A  . Number of children: N/A  . Years of education: N/A   Social History Main Topics  . Smoking status: Never Smoker  . Smokeless tobacco: Never Used  . Alcohol use No  . Drug use: No  . Sexual activity: Not Asked   Other Topics Concern  . None   Social History Narrative  . None     Family History:  The patient's Family history is unknown by patient.       ROS: Please see the histoy of present illness.  All other systems reviewed and are negative.  Physical Exam  Constitutional: He is oriented to person, place, and time. He appears well-developed and well-nourished. No distress.  HENT:  Head: Normocephalic and atraumatic.  Neck: No  JVD present.  Cardiovascular: Normal rate, regular rhythm and normal heart sounds.  Exam reveals no gallop and no friction rub.   No murmur heard. Pulmonary/Chest: Effort normal. No respiratory distress. He has no wheezes. He has no rales.  Abdominal: Soft.  Musculoskeletal: Normal range of motion. He exhibits no edema or deformity.  Neurological: He is alert and oriented to person, place, and time.  Skin: Skin is warm and dry.  Psychiatric: He has a normal mood and affect. His behavior is normal.   Vitals:   04/21/17 1126  BP: 112/68  Pulse: (!) 54     Wt Readings from Last 3 Encounters:  04/21/17 173 lb 1.9 oz (78.5 kg)  03/18/17 176 lb (79.8 kg)  03/17/17 176 lb 1.9 oz (79.9 kg)      Studies/Labs Reviewed:   EKG:  EKG is not ordered today.  The event monitor report is reviewed.  Recent Labs: 02/14/2017: TSH 1.080 03/17/2017: BUN 16; Creatinine, Ser 0.93; Hemoglobin 15.7; Platelets 194; Potassium 4.4; Sodium 140   Lipid Panel No results found for: CHOL, TRIG, HDL, CHOLHDL, VLDL, LDLCALC, LDLDIRECT  Additional studies/ records that were reviewed today include:    Echo 02/18/17 EF 55-60, no RWMA, Gr 2 DD, trivial AI, MAC, mild MR, severe LAE, mild RAE, PASP 40  Myoview  07/2015 EF 55-65, normal study; low risk  LHC 2001: CONCLUSIONS: 1. Significant first diagonal stenosis with 95% ostial lesion accounting for    the patients anginal symptoms and some mild ischemia noted in the anterior    wall on the Cardiolite.  No evidence of significant right coronary or    circumflex disease is noted to account for, what was felt to be, mild    ischemia inferiorly.  The proximal and mid and distal portions of the main    coronary arterial trees (right, circumflex, and LAD) are widely patent. 2. Normal left ventricular function PLAN: medical threapy  LHC 11/2004 AL HK, EF 60, 1-2+ MR LAD prox 20-30, Dx ostial 90 LCx prox 30 RCA prox 20-30  03/18/17 Myoivew Study Highlights    Nuclear stress EF: 59%. No wall motion abnormalities  There was no ST segment deviation noted during stress.  Defect 1: There is a small defect of mild severity present in the apex location. May be representative of old small apical infarction. Abnormal study.  This is a low risk study. There is no ischemia identified.     30 day event monitor 6/7-04/20/17 Narrative Summary The patient was monitored for 734:18 hours, of which 606:59 hours were usable. Average heart rate for the monitored period was 66 BPM. Tachycardia was present for <1% of the readable data; Bradycardia was present for 36% of the readable data. No Pause(s) noted of 3 seconds or longer. 21 manually-triggered recording(s) posted with symptoms including Rapid HR/Palp/Flutter, Dizziness/Lightheaded, Other. Immediate notification was made for 1 episode(s). Preliminary Findings are provided by a Certified Cardiac Technician  0% afib burden  ASSESSMENT & PLAN:   15 beat WCT: Noted on 30 day event monitor on 04/16/17. Pt noted mild lightheadedness, no chest pain, shortness of breath or near syncope. Metoprolol 25 mg bid was initiated and he seems to be tolerating. Findings were discussed with Dr. Johnsie Cancel, DOD, and he feels  that with recent normal Echo and stress test and no chest pain or shortness of breath, the patient is not at high risk for prolonged Vtach. Would continue metoprolol and stop amlodipine to make room for BB. Pt is advised  to report any increased episodes of dizziness, chest pain, or syncope.   PAF: Pt had episode of afib during endoscopic procedure. 30 day event monitor shows to atrial fib, but PAC's are noted. Pt has been started on metoprolol 25 mg bid. Is on Eliquis 5 mg bid for stroke risk reduction. Continue current therapy.   CAD: Hx of 90% stenosis at the origin of a first diagonal branch in 2001 and stable by cath in 2006. He was treated medically. No chest pain or new significant dyspnea. Low risk myoview on 03/18/17. Continue statin, Imdur, captopril. Aspirin stopped with initiation of Eliquis.   HTN: Blood pressure well controlled. Amlodipine stopped with initiation of metoprolol. Continue all other meds.   Generalized fatigue:  Ongoing for 2 1/2 years. Unclear etiology. Echo and stess test normal last month. Pt is not interested in sleep study at present. He and his wife feel that he is sleeping very well lately without snoring. (used to fight in his sleep, but not anymore)  Medication Adjustments/Labs and Tests Ordered: Current medicines are reviewed at length with the patient today.  Concerns regarding medicines are outlined above.  Medication changes, Labs and Tests ordered today are listed in the Patient Instructions below. Patient Instructions  Medication Instructions:  Your physician has recommended you make the following change in your medication:  1.  STOP the Caduet 2.  START Atorvastatin 10 mg taking 1 tablet daily  Labwork: None ordered  Testing/Procedures: None ordered  Follow-Up: Your physician recommends that you schedule a follow-up appointment in: 2 [place]   Any Other Special Instructions Will Be Listed Below (If Applicable). Call us if you have  increased dizziness, any chest pain, or passing out.    If you need a refill on your cardiac medications before your next appointment, please call your pharmacy.    Signed, Daune Perch, NP  04/21/2017 12:29 PM    [facility] [address], [place],   [address] Phone: [phone]; Fax: [phone]

## 2017-04-21 ENCOUNTER — Telehealth: Payer: Self-pay | Admitting: Cardiology

## 2017-04-21 ENCOUNTER — Encounter: Payer: Self-pay | Admitting: Cardiology

## 2017-04-21 ENCOUNTER — Ambulatory Visit (INDEPENDENT_AMBULATORY_CARE_PROVIDER_SITE_OTHER): Payer: Medicare Other | Admitting: Cardiology

## 2017-04-21 VITALS — BP 112/68 | HR 54 | Ht 67.5 in | Wt 173.1 lb

## 2017-04-21 DIAGNOSIS — R5383 Other fatigue: Secondary | ICD-10-CM | POA: Diagnosis not present

## 2017-04-21 DIAGNOSIS — I2510 Atherosclerotic heart disease of native coronary artery without angina pectoris: Secondary | ICD-10-CM

## 2017-04-21 DIAGNOSIS — I10 Essential (primary) hypertension: Secondary | ICD-10-CM | POA: Diagnosis not present

## 2017-04-21 DIAGNOSIS — I480 Paroxysmal atrial fibrillation: Secondary | ICD-10-CM | POA: Diagnosis not present

## 2017-04-21 DIAGNOSIS — I472 Ventricular tachycardia: Secondary | ICD-10-CM

## 2017-04-21 DIAGNOSIS — R000 Tachycardia, unspecified: Secondary | ICD-10-CM

## 2017-04-21 MED ORDER — APIXABAN 5 MG PO TABS: 5.0000 mg | ORAL_TABLET | Freq: Two times a day (BID) | ORAL | 11 refills | Status: DC

## 2017-04-21 MED ORDER — ATORVASTATIN CALCIUM 10 MG PO TABS: 10.0000 mg | ORAL_TABLET | Freq: Every day | ORAL | 1 refills | Status: DC

## 2017-04-21 NOTE — Patient Instructions (Addendum)
Medication Instructions:  Your physician has recommended you make the following change in your medication:  1.  STOP the Caduet 2.  START Atorvastatin 10 mg taking 1 tablet daily  Labwork: None ordered  Testing/Procedures: None ordered  Follow-Up: Your physician recommends that you schedule a follow-up appointment in: 2 [place]   Any Other Special Instructions Will Be Listed Below (If Applicable). Call us if you have increased dizziness, any chest pain, or passing out.    If you need a refill on your cardiac medications before your next appointment, please call your pharmacy.

## 2017-04-21 NOTE — Telephone Encounter (Signed)
New Message:    Pt was seen today,he needs to know if he still need that appointment with Richardson Dopp on 05-27-17?

## 2017-04-21 NOTE — Telephone Encounter (Signed)
I returned call to pt and his wife. Pt gave me permission ok to s/w his wife Rose. Pt's wife states pt was seen today and they just wanted to know if pt needed to still be seen 05/27/17 with Brynda Rim. PA. I d/w Pecolia Ades, NP who saw pt today. NP states pt does not need to keep the 8/14 appt with Brynda Rim. PA since she requested to have pt see Dr. Acie Fredrickson in 2 months. Pt  scheduled for Dr. Acie Fredrickson 9/26 @ 9:40. Pt's wife thanked me for my call today.

## 2017-05-22 ENCOUNTER — Telehealth: Payer: Self-pay | Admitting: Cardiovascular Disease

## 2017-05-22 NOTE — Telephone Encounter (Signed)
Spoke with patient who states he accidentally took 2 eliquis 5 mg this morning at 0800. I advised him not to take another dose today and to resume his routine of 5 mg every 12 hours tomorrow. He states his wife has moved the medication so that he doesn't get confused again. He verbalized understanding and agreement with plan and thanked me for the call.

## 2017-05-22 NOTE — Telephone Encounter (Signed)
New message   Pt c/o medication issue:  1. Name of Medication: apixaban (ELIQUIS) 5 MG TABS tablet  2. How are you currently taking this medication (dosage and times per day)? 5MG   3. Are you having a reaction (difficulty breathing--STAT)? no  4. What is your medication issue? Takes at [place] and instead of doing it those times, he took 2 this morning at [place] and the pt wife wants to know if that was OK. Requests a call back.

## 2017-05-23 ENCOUNTER — Emergency Department (HOSPITAL_COMMUNITY)
Admission: EM | Admit: 2017-05-23 | Discharge: 2017-05-23 | Disposition: A | Discharge status: Home / Self Care | Payer: Medicare Other | Attending: Emergency Medicine | Admitting: Emergency Medicine

## 2017-05-23 ENCOUNTER — Emergency Department (HOSPITAL_COMMUNITY): Payer: Medicare Other

## 2017-05-23 ENCOUNTER — Encounter (HOSPITAL_COMMUNITY): Payer: Self-pay | Admitting: *Deleted

## 2017-05-23 ENCOUNTER — Telehealth: Payer: Self-pay | Admitting: Cardiovascular Disease

## 2017-05-23 DIAGNOSIS — R42 Dizziness and giddiness: Secondary | ICD-10-CM

## 2017-05-23 DIAGNOSIS — Z7901 Long term (current) use of anticoagulants: Secondary | ICD-10-CM | POA: Diagnosis not present

## 2017-05-23 DIAGNOSIS — Z79899 Other long term (current) drug therapy: Secondary | ICD-10-CM | POA: Diagnosis not present

## 2017-05-23 DIAGNOSIS — R251 Tremor, unspecified: Secondary | ICD-10-CM | POA: Insufficient documentation

## 2017-05-23 DIAGNOSIS — R001 Bradycardia, unspecified: Secondary | ICD-10-CM | POA: Insufficient documentation

## 2017-05-23 DIAGNOSIS — T447X5A Adverse effect of beta-adrenoreceptor antagonists, initial encounter: Secondary | ICD-10-CM | POA: Diagnosis not present

## 2017-05-23 DIAGNOSIS — I252 Old myocardial infarction: Secondary | ICD-10-CM | POA: Insufficient documentation

## 2017-05-23 DIAGNOSIS — I481 Persistent atrial fibrillation: Secondary | ICD-10-CM | POA: Diagnosis not present

## 2017-05-23 DIAGNOSIS — I10 Essential (primary) hypertension: Secondary | ICD-10-CM | POA: Diagnosis not present

## 2017-05-23 DIAGNOSIS — R531 Weakness: Secondary | ICD-10-CM | POA: Insufficient documentation

## 2017-05-23 DIAGNOSIS — R5383 Other fatigue: Secondary | ICD-10-CM | POA: Diagnosis not present

## 2017-05-23 DIAGNOSIS — I2510 Atherosclerotic heart disease of native coronary artery without angina pectoris: Secondary | ICD-10-CM | POA: Insufficient documentation

## 2017-05-23 DIAGNOSIS — R4020 Unspecified coma: Secondary | ICD-10-CM | POA: Diagnosis not present

## 2017-05-23 HISTORY — DX: Unspecified atrial fibrillation: I48.91

## 2017-05-23 LAB — DIFFERENTIAL
Basophils Absolute: 0 10*3/uL (ref 0.0–0.1)
Basophils Relative: 0 %
EOS PCT: 2 %
Eosinophils Absolute: 0.2 10*3/uL (ref 0.0–0.7)
LYMPHS ABS: 1.5 10*3/uL (ref 0.7–4.0)
LYMPHS PCT: 21 %
MONO ABS: 0.4 10*3/uL (ref 0.1–1.0)
Monocytes Relative: 5 %
Neutro Abs: 5.2 10*3/uL (ref 1.7–7.7)
Neutrophils Relative %: 72 %

## 2017-05-23 LAB — COMPREHENSIVE METABOLIC PANEL
ALK PHOS: 64 U/L (ref 38–126)
ALT: 15 U/L — ABNORMAL LOW (ref 17–63)
AST: 19 U/L (ref 15–41)
Albumin: 3.5 g/dL (ref 3.5–5.0)
Anion gap: 9 (ref 5–15)
BUN: 19 mg/dL (ref 6–20)
CALCIUM: 8.9 mg/dL (ref 8.9–10.3)
CHLORIDE: 110 mmol/L (ref 101–111)
CO2: 23 mmol/L (ref 22–32)
CREATININE: 1.05 mg/dL (ref 0.61–1.24)
GFR calc Af Amer: >60 mL/min (ref >60)
GFR calc non Af Amer: >60 mL/min (ref >60)
GLUCOSE: 121 mg/dL — AB (ref 65–99)
Potassium: 4.3 mmol/L (ref 3.5–5.1)
SODIUM: 142 mmol/L (ref 135–145)
Total Bilirubin: 1.2 mg/dL (ref 0.3–1.2)
Total Protein: 5.9 g/dL — ABNORMAL LOW (ref 6.5–8.1)

## 2017-05-23 LAB — CBC
HCT: 41.3 % (ref 39.0–52.0)
Hemoglobin: 13.5 g/dL (ref 13.0–17.0)
MCH: 29 pg (ref 26.0–34.0)
MCHC: 32.7 g/dL (ref 30.0–36.0)
MCV: 88.6 fL (ref 78.0–100.0)
PLATELETS: 174 10*3/uL (ref 150–400)
RBC: 4.66 MIL/uL (ref 4.22–5.81)
RDW: 14.7 % (ref 11.5–15.5)
WBC: 7.3 10*3/uL (ref 4.0–10.5)

## 2017-05-23 LAB — PROTIME-INR
INR: 1.43
PROTHROMBIN TIME: 17.6 s — AB (ref 11.4–15.2)

## 2017-05-23 LAB — I-STAT TROPONIN, ED: Troponin i, poc: 0.01 ng/mL (ref 0.00–0.08)

## 2017-05-23 LAB — APTT: aPTT: 37 seconds — ABNORMAL HIGH (ref 24–36)

## 2017-05-23 MED ORDER — METOPROLOL TARTRATE 25 MG PO TABS: 12.5000 mg | ORAL_TABLET | Freq: Two times a day (BID) | ORAL | Status: DC

## 2017-05-23 MED ORDER — CAPTOPRIL 50 MG PO TABS
50.0000 mg | ORAL_TABLET | Freq: Two times a day (BID) | ORAL | 11 refills | Status: DC
Start: 2017-05-23 — End: 2017-06-04

## 2017-05-23 NOTE — ED Triage Notes (Signed)
Pt reports recent diagnosis of afib in June and started on meds. Having problems with controlling his bp, reports it has been elevated recently. Now also reports some confusion, dizziness, unsteady gait and difficulty with speech and focusing x several days.

## 2017-05-23 NOTE — ED Notes (Signed)
Pt departed in NAD.  

## 2017-05-23 NOTE — ED Provider Notes (Signed)
[place] DEPT Provider Note   CSN: [id number] Arrival date & time: 05/23/17  1134     History   Chief Complaint Chief Complaint  Patient presents with  . Dizziness  . Hypertension    HPI Edrick Whitehorn is a 83 y.o. male.  Patient c/o feeling generally weak, no energy, tired/falls asleep easily, feeling shaky as if may fall, for the past 3 weeks. Symptoms gradual onset, moderate, persistent. No acute/abrupt change today.  No slurred speech. Denies change in vision. No one-sided numbness or weakness. States compliant w normal meds - states a month ago change bp meds and since bp is sometimes high and sometimes lower. Denies any chest pain or discomfort. No palpitations. Denies fever or chills. Normal appetite. No vomiting or diarrhea. No dysuria or gu c/o. No headaches. Denies blood loss, melena or rectal bleeding.    The history is provided by the patient.  Dizziness  Associated symptoms: weakness   Associated symptoms: no chest pain, no headaches, no palpitations, no shortness of breath and no vomiting   Hypertension  Pertinent negatives include no chest pain, no abdominal pain, no headaches and no shortness of breath.    Past Medical History:  Diagnosis Date  . Arthritis   . Atrial fibrillation ([place])   . Borderline diabetes   . Esophageal stricture   . GERD (gastroesophageal reflux disease)   . History of nuclear stress test    Nuclear stress test 6/18: EF 59, small apical defect (?old MI); no ischemia; Low Risk  . Hypertension   . Myocardial infarction ([place])   . Neck fracture [facility]) 2015    Patient Active Problem List   Diagnosis Date Noted  . Persistent atrial fibrillation ([place]) 02/14/2017  . Essential hypertension 02/14/2017  . CAD (coronary artery disease) 07/10/2015  . Fatigue 07/10/2015    Past Surgical History:  Procedure Laterality Date  . BALLOON DILATION  04/02/2012   Procedure: BALLOON DILATION;  Surgeon: Arta Silence, MD;  Location: [facility]  ENDOSCOPY;  Service: Endoscopy;  Laterality: N/A;  . BALLOON DILATION N/A 06/16/2013   Procedure: BALLOON DILATION;  Surgeon: Arta Silence, MD;  Location: [facility] ENDOSCOPY;  Service: Endoscopy;  Laterality: N/A;  . BALLOON DILATION N/A 11/16/2014   Procedure: BALLOON DILATION;  Surgeon: Arta Silence, MD;  Location: [facility] ENDOSCOPY;  Service: Endoscopy;  Laterality: N/A;  . BALLOON DILATION N/A 02/13/2017   Procedure: BALLOON DILATION;  Surgeon: Arta Silence, MD;  Location: [facility] ENDOSCOPY;  Service: Endoscopy;  Laterality: N/A;  . CORNEAL TRANSPLANT    . ESOPHAGOGASTRODUODENOSCOPY  04/02/2012   Procedure: ESOPHAGOGASTRODUODENOSCOPY (EGD);  Surgeon: Arta Silence, MD;  Location: [facility] ENDOSCOPY;  Service: Endoscopy;  Laterality: N/A;  . ESOPHAGOGASTRODUODENOSCOPY N/A 05/28/2013   Procedure: ESOPHAGOGASTRODUODENOSCOPY (EGD);  Surgeon: Cleotis Nipper, MD;  Location: [facility] ENDOSCOPY;  Service: Endoscopy;  Laterality: N/A;  . ESOPHAGOGASTRODUODENOSCOPY N/A 02/20/2015   Procedure: ESOPHAGOGASTRODUODENOSCOPY (EGD);  Surgeon: Arta Silence, MD;  Location: [facility] ENDOSCOPY;  Service: Endoscopy;  Laterality: N/A;  . ESOPHAGOGASTRODUODENOSCOPY (EGD) WITH PROPOFOL N/A 06/16/2013   Procedure: ESOPHAGOGASTRODUODENOSCOPY (EGD) WITH PROPOFOL;  Surgeon: Arta Silence, MD;  Location: [facility] ENDOSCOPY;  Service: Endoscopy;  Laterality: N/A;  . ESOPHAGOGASTRODUODENOSCOPY (EGD) WITH PROPOFOL N/A 11/16/2014   Procedure: ESOPHAGOGASTRODUODENOSCOPY (EGD) WITH PROPOFOL;  Surgeon: Arta Silence, MD;  Location: [facility] ENDOSCOPY;  Service: Endoscopy;  Laterality: N/A;  . ESOPHAGOGASTRODUODENOSCOPY (EGD) WITH PROPOFOL N/A 02/13/2017   Procedure: ESOPHAGOGASTRODUODENOSCOPY (EGD) WITH PROPOFOL;  Surgeon: Arta Silence, MD;  Location: [facility];  Service: Endoscopy;  Laterality: N/A;  .  EYE SURGERY     cataract sx  . HERNIA REPAIR     x 2  . TOTAL HIP ARTHROPLASTY     bilateral       Home Medications    Prior to Admission medications     Medication Sig Start Date End Date Taking? Authorizing Provider  apixaban (ELIQUIS) 5 MG TABS tablet Take 1 tablet (5 mg total) by mouth 2 (two) times daily. 04/21/17   Daune Perch, NP  atorvastatin (LIPITOR) 10 MG tablet Take 1 tablet (10 mg total) by mouth daily. 04/21/17   Daune Perch, NP  captopril (CAPOTEN) 25 MG tablet Take 25 mg by mouth at bedtime.     [provider]  finasteride (PROSCAR) 5 MG tablet Take 5 mg by mouth every morning.     [provider]  halobetasol (ULTRAVATE) 0.05 % ointment Apply topically 2 (two) times daily. APPLY TWICE DAILY TO HANDS 04/28/15   [provider]  isosorbide mononitrate (IMDUR) 30 MG 24 hr tablet Take 30 mg by mouth daily.    [provider]  metoprolol tartrate (LOPRESSOR) 25 MG tablet Take 1 tablet (25 mg total) by mouth 2 (two) times daily. 04/16/17 04/16/18  Isaiah Serge, NP  NITROSTAT 0.4 MG SL tablet Take 0.4 mg by mouth every 5 (five) minutes as needed for chest pain. AS NEEDED FOR CHEST PAIN 06/15/15   [provider]  omeprazole (PRILOSEC) 20 MG capsule Take 20 mg by mouth daily.    [provider]    Family History Family History  Problem Relation Age of Onset  . Family history unknown: Yes    Social History Social History  Substance Use Topics  . Smoking status: Never Smoker  . Smokeless tobacco: Never Used  . Alcohol use No     Allergies   Patient has no known allergies.   Review of Systems Review of Systems  Constitutional: Negative for chills and fever.  HENT: Negative for sore throat.   Eyes: Negative for visual disturbance.  Respiratory: Negative for shortness of breath.   Cardiovascular: Negative for chest pain, palpitations and leg swelling.  Gastrointestinal: Negative for abdominal pain and vomiting.  Endocrine: Negative for polyuria.  Genitourinary: Negative for dysuria and flank pain.  Musculoskeletal: Negative for neck pain.  Skin: Negative for rash.   Neurological: Positive for dizziness and weakness. Negative for headaches.  Hematological: Does not bruise/bleed easily.  Psychiatric/Behavioral: Positive for confusion.     Physical Exam Updated Vital Signs BP (!) 151/73 (BP Location: Left Arm)   Pulse (!) 48   Temp 97.6 F (36.4 C) (Oral)   Resp 17   SpO2 98%   Physical Exam  Constitutional: He appears well-developed and well-nourished. No distress.  HENT:  Head: Atraumatic.  Mouth/Throat: Oropharynx is clear and moist.  Eyes: Pupils are equal, round, and reactive to light. Conjunctivae are normal.  Neck: Neck supple. No tracheal deviation present. No thyromegaly present.  No bruits.   Cardiovascular: Regular rhythm, normal heart sounds and intact distal pulses.   Bradycardic.   Pulmonary/Chest: Effort normal and breath sounds normal. No accessory muscle usage. No respiratory distress.  Abdominal: Soft. Bowel sounds are normal. He exhibits no distension. There is no tenderness.  Genitourinary:  Genitourinary Comments: No cva tenderness  Musculoskeletal: He exhibits no edema.  Neurological: He is alert.  Speech clear/fluent. Pt oriented. Motor intact bil, stre 5/5. No pronator drift. sens grossly intact.   Skin: Skin is warm and dry. No rash  noted. He is not diaphoretic.  Psychiatric: He has a normal mood and affect.  Nursing note and vitals reviewed.    ED Treatments / Results  Labs (all labs ordered are listed, but only abnormal results are displayed) Results for orders placed or performed during the hospital encounter of 05/23/17  Protime-INR  Result Value Ref Range   Prothrombin Time 17.6 (H) 11.4 - 15.2 seconds   INR 1.43   APTT  Result Value Ref Range   aPTT 37 (H) 24 - 36 seconds  CBC  Result Value Ref Range   WBC 7.3 4.0 - 10.5 K/uL   RBC 4.66 4.22 - 5.81 MIL/uL   Hemoglobin 13.5 13.0 - 17.0 g/dL   HCT 41.3 39.0 - 52.0 %   MCV 88.6 78.0 - 100.0 fL   MCH 29.0 26.0 - 34.0 pg   MCHC 32.7 30.0 - 36.0  g/dL   RDW 14.7 11.5 - 15.5 %   Platelets 174 150 - 400 K/uL  Differential  Result Value Ref Range   Neutrophils Relative % 72 %   Neutro Abs 5.2 1.7 - 7.7 K/uL   Lymphocytes Relative 21 %   Lymphs Abs 1.5 0.7 - 4.0 K/uL   Monocytes Relative 5 %   Monocytes Absolute 0.4 0.1 - 1.0 K/uL   Eosinophils Relative 2 %   Eosinophils Absolute 0.2 0.0 - 0.7 K/uL   Basophils Relative 0 %   Basophils Absolute 0.0 0.0 - 0.1 K/uL  Comprehensive metabolic panel  Result Value Ref Range   Sodium 142 135 - 145 mmol/L   Potassium 4.3 3.5 - 5.1 mmol/L   Chloride 110 101 - 111 mmol/L   CO2 23 22 - 32 mmol/L   Glucose, Bld 121 (H) 65 - 99 mg/dL   BUN 19 6 - 20 mg/dL   Creatinine, Ser 1.05 0.61 - 1.24 mg/dL   Calcium 8.9 8.9 - 10.3 mg/dL   Total Protein 5.9 (L) 6.5 - 8.1 g/dL   Albumin 3.5 3.5 - 5.0 g/dL   AST 19 15 - 41 U/L   ALT 15 (L) 17 - 63 U/L   Alkaline Phosphatase 64 38 - 126 U/L   Total Bilirubin 1.2 0.3 - 1.2 mg/dL   GFR calc non Af Amer >60 >60 mL/min   GFR calc Af Amer >60 >60 mL/min   Anion gap 9 5 - 15  I-stat troponin, ED  Result Value Ref Range   Troponin i, poc 0.01 0.00 - 0.08 ng/mL   Comment 3           Ct Head Wo Contrast  Result Date: 05/23/2017 CLINICAL DATA:  Altered level of consciousness. Unsteady gait and feeling "swimmy headed". Hypertension. EXAM: CT HEAD WITHOUT CONTRAST TECHNIQUE: Contiguous axial images were obtained from the base of the skull through the vertex without intravenous contrast. COMPARISON:  02/08/2014 FINDINGS: Brain: No evidence of acute infarction, hemorrhage, hydrocephalus, extra-axial collection or mass lesion/mass effect. Mild for age chronic microvascular ischemic change in the periventricular white matter. Mild generalized cerebral volume loss. Vascular: Atherosclerotic calcification.  No hyperdense vessel. Skull: No acute or aggressive finding. Sinuses/Orbits: Bilateral cataract resection. No acute finding. The mastoid and middle ear spaces are  clear. IMPRESSION: No acute finding or explanation for symptoms. Electronically Signed   By: Monte Fantasia M.D.   On: 05/23/2017 12:43    EKG  EKG Interpretation  Date/Time:  Friday May 23 2017 11:39:38 EDT Ventricular Rate:  50 PR Interval:  178 QRS Duration: 92  QT Interval:  448 QTC Calculation: 408 R Axis:   65 Text Interpretation:  Sinus bradycardia Nonspecific ST and T wave abnormality Confirmed by Lajean Saver [phone]) on 05/23/2017 3:12:41 PM       Radiology Ct Head Wo Contrast  Result Date: 05/23/2017 CLINICAL DATA:  Altered level of consciousness. Unsteady gait and feeling "swimmy headed". Hypertension. EXAM: CT HEAD WITHOUT CONTRAST TECHNIQUE: Contiguous axial images were obtained from the base of the skull through the vertex without intravenous contrast. COMPARISON:  02/08/2014 FINDINGS: Brain: No evidence of acute infarction, hemorrhage, hydrocephalus, extra-axial collection or mass lesion/mass effect. Mild for age chronic microvascular ischemic change in the periventricular white matter. Mild generalized cerebral volume loss. Vascular: Atherosclerotic calcification.  No hyperdense vessel. Skull: No acute or aggressive finding. Sinuses/Orbits: Bilateral cataract resection. No acute finding. The mastoid and middle ear spaces are clear. IMPRESSION: No acute finding or explanation for symptoms. Electronically Signed   By: Monte Fantasia M.D.   On: 05/23/2017 12:43    Procedures Procedures (including critical care time)  Medications Ordered in ED Medications - No data to display   Initial Impression / Assessment and Plan / ED Course  I have reviewed the triage vital signs and the nursing notes.  Pertinent labs & imaging results that were available during my care of the patient were reviewed by me and considered in my medical decision making (see chart for details).  Iv ns. Labs.   Ct head.  Reviewed nursing notes and prior charts for additional history.   Heart  rate as low as 43 on monitor - ?due to b blocker therapy.  Pt indicates had recent event monitor at cardiologist office for 30 days - states completed last week but hasnt heard results.   Cardiology consulted - discussed w Wannetta Sender - they will see in ED.  Dr Sallyanne Kuster, cardiology,  evaluated in ED, and arranged for patient to wean off his metoprolol, d/c to home, and f/u with them in the office.   Final Clinical Impressions(s) / ED Diagnoses   Final diagnoses:  None    New Prescriptions New Prescriptions   No medications on file     Lajean Saver, MD 05/23/17 1932

## 2017-05-23 NOTE — Discharge Instructions (Addendum)
It was our pleasure to provide your ER care today - we hope that you feel better.  Wean off metoprolol tartrate gradually as follows: - Starting tonight take only half of a 25 mg tablet (12.5 mg) twice a day - Starting Monday, 05/26/2017 stop this medication completely  Follow up with cardiologist in office as discussed with him.  Return to ER if worse, new symptoms, fevers, chest pain, fainting, trouble breathing, other concern.

## 2017-05-23 NOTE — Telephone Encounter (Signed)
New message   Pt c/o BP issue: STAT if pt c/o blurred vision, one-sided weakness or slurred speech  1. What are your last 5 BP readings? 174/82, 162/63, 153/69, 158/69, 114/61, 164/77, 187/90, 194/89  2. Are you having any other symptoms (ex. Dizziness, headache, blurred vision, passed out)? Pt is not thinking clearly per wife  3. What is your BP issue? Pt bp is running high and wife is concerned and requests a call back

## 2017-05-23 NOTE — Consult Note (Signed)
Cardiology Consultation:   Patient ID: Jonathan [name]; [id number]; 1931-05-08   Admit date: 05/23/2017 Date of Consult: 05/23/2017  Primary Care Provider: Josetta Huddle, MD Primary Cardiologist: Jonathan [name]    Patient Profile:   Jonathan [name] is a 83 y.o. male with a hx of Coronary artery disease, hypertension, a single episode of paroxysmal atrial fibrillation and asymptomatic nonsustained ventricular tachycardia who is being seen today for the evaluation of fatigue at the request of Dr. Ashok [name].  History of Present Illness:   [name] presents today with a major complaint of lack of energy and fatigue. He easily falls asleep and is unable to perform his usual activities. If he tries to climb a hill he "gives out". He does not describe dyspnea simply lack of energy. He denies angina, palpitations, full blown syncope, leg edema, claudication or clearcut focal neurological complaints. He does complain about short-term memory problems, sleepiness and occasional gait instability. His blood pressure has been a little erratic.  The complaints of fatigue date back about 2 or 3 years, but are substantially worse after changes in his medications performed after an episode of persistent atrial fibrillation that occurred during EGD about 3 months ago. He was scheduled to have a cardioversion but converted spontaneously. He is taking an anticoagulant. His medications were changed include a beta blocker and his previous dose of captopril was reduced. He has felt worse since then.  He wore a 30 day event monitor last month that did not show atrial fibrillation. He did have a couple of episodes of nonsustained VT, asymptomatic. He had frequent PACs and PVCs.  While in the emergency room today his heart rate is consistently in the 40-50 bpm range. This is lower than had been reported on his event monitor, but the event monitor also showed relative background bradycardia.  Looking through old records  profound fatigue in response to previous beta blocker therapy has been a recurrent theme, although lack of energy has been a complaint over the lat couple of years even without beta blockers.  Past Medical History:  Diagnosis Date  . Arthritis   . Atrial fibrillation ([place])   . Borderline diabetes   . Esophageal stricture   . GERD (gastroesophageal reflux disease)   . History of nuclear stress test    Nuclear stress test 6/18: EF 59, small apical defect (?old MI); no ischemia; Low Risk  . Hypertension   . Myocardial infarction ([place])   . Neck fracture ([place]) 2015    Past Surgical History:  Procedure Laterality Date  . BALLOON DILATION  04/02/2012   Procedure: BALLOON DILATION;  Surgeon: Arta Silence, MD;  Location: [facility];  Service: Endoscopy;  Laterality: N/A;  . BALLOON DILATION N/A 06/16/2013   Procedure: BALLOON DILATION;  Surgeon: Arta Silence, MD;  Location: [facility];  Service: Endoscopy;  Laterality: N/A;  . BALLOON DILATION N/A 11/16/2014   Procedure: BALLOON DILATION;  Surgeon: Arta Silence, MD;  Location: [facility];  Service: Endoscopy;  Laterality: N/A;  . BALLOON DILATION N/A 02/13/2017   Procedure: BALLOON DILATION;  Surgeon: Arta Silence, MD;  Location: [facility] ENDOSCOPY;  Service: Endoscopy;  Laterality: N/A;  . CORNEAL TRANSPLANT    . ESOPHAGOGASTRODUODENOSCOPY  04/02/2012   Procedure: ESOPHAGOGASTRODUODENOSCOPY (EGD);  Surgeon: Arta Silence, MD;  Location: [facility];  Service: Endoscopy;  Laterality: N/A;  . ESOPHAGOGASTRODUODENOSCOPY N/A 05/28/2013   Procedure: ESOPHAGOGASTRODUODENOSCOPY (EGD);  Surgeon: Cleotis Nipper, MD;  Location: [facility];  Service: Endoscopy;  Laterality: N/A;  . ESOPHAGOGASTRODUODENOSCOPY N/A 02/20/2015  Procedure: ESOPHAGOGASTRODUODENOSCOPY (EGD);  Surgeon: Arta Silence, MD;  Location: [facility];  Service: Endoscopy;  Laterality: N/A;  . ESOPHAGOGASTRODUODENOSCOPY (EGD) WITH PROPOFOL N/A 06/16/2013   Procedure:  ESOPHAGOGASTRODUODENOSCOPY (EGD) WITH PROPOFOL;  Surgeon: Arta Silence, MD;  Location: [facility];  Service: Endoscopy;  Laterality: N/A;  . ESOPHAGOGASTRODUODENOSCOPY (EGD) WITH PROPOFOL N/A 11/16/2014   Procedure: ESOPHAGOGASTRODUODENOSCOPY (EGD) WITH PROPOFOL;  Surgeon: Arta Silence, MD;  Location: [facility];  Service: Endoscopy;  Laterality: N/A;  . ESOPHAGOGASTRODUODENOSCOPY (EGD) WITH PROPOFOL N/A 02/13/2017   Procedure: ESOPHAGOGASTRODUODENOSCOPY (EGD) WITH PROPOFOL;  Surgeon: Arta Silence, MD;  Location: [facility];  Service: Endoscopy;  Laterality: N/A;  . EYE SURGERY     cataract sx  . HERNIA REPAIR     x 2  . TOTAL HIP ARTHROPLASTY     bilateral     Inpatient Medications: Scheduled Meds:  Continuous Infusions:  PRN Meds:   Allergies:   No Known Allergies  Social History:   Social History   Social History  . Marital status: Married    Spouse name: N/A  . Number of children: N/A  . Years of education: N/A   Occupational History  . Not on file.   Social History Main Topics  . Smoking status: Never Smoker  . Smokeless tobacco: Never Used  . Alcohol use No  . Drug use: No  . Sexual activity: Not on file   Other Topics Concern  . Not on file   Social History Narrative  . No narrative on file    Family History:   His mother was healthy her entire life and had a stroke at age 26, died a few days later. No other pertinent family history  ROS:  Please see the history of present illness.  ROS  All other ROS reviewed and negative.     Physical Exam/Data:   Vitals:   05/23/17 1800 05/23/17 1815 05/23/17 1830 05/23/17 1845  BP: (!) 154/73 (!) 149/78 (!) 173/86 (!) 167/92  Pulse: (!) 44 (!) 46 (!) 50 (!) 50  Resp: 16 16 16 14   Temp:      TempSrc:      SpO2: 100% 99% 98% 93%   No intake or output data in the 24 hours ending 05/23/17 1918 There were no vitals filed for this visit. There is no height or weight on file to calculate BMI.    General:  Well nourished, well developed, in no acute distress HEENT: normal Lymph: no adenopathy Neck: no JVD Endocrine:  No thryomegaly Vascular: No carotid bruits; FA pulses 2+ bilaterally without bruits  Cardiac:  normal S1, S2; RRR; no murmur  Lungs:  clear to auscultation bilaterally, no wheezing, rhonchi or rales  Abd: soft, nontender, no hepatomegaly  Ext: no edema Musculoskeletal:  No deformities, BUE and BLE strength normal and equal Skin: warm and dry  Neuro:  CNs 2-12 intact, no focal abnormalities noted Psych:  Normal affect   EKG:  The EKG was personally reviewed and demonstrates:  Sinus bradycardia, small inferior Q waves, ST segment depression and T-wave inversion in leads 1 and aVL, unchanged from previous tracings Telemetry:  Telemetry was personally reviewed and demonstrates:  Sinus bradycardia, sometimes profound  Relevant CV Studies:  Echo 02/18/17 EF 55-60%, no RWMA, Gr 2 DD, trivial AI, MAC, mild MR, severe LAE, mild RAE, PASP 40  Myoview 07/2015 EF 55-65, normal study; low risk  LHC 11/2004 AL HK, EF 60, 1-2+ MR LAD prox 20-30, Dx ostial 71 (old) LCx prox 30  RCA prox 20-30   Laboratory Data:  Chemistry Recent Labs Lab 05/23/17 1151  NA 142  K 4.3  CL 110  CO2 23  GLUCOSE 121*  BUN 19  CREATININE 1.05  CALCIUM 8.9  GFRNONAA >60  GFRAA >60  ANIONGAP 9     Recent Labs Lab 05/23/17 1151  PROT 5.9*  ALBUMIN 3.5  AST 19  ALT 15*  ALKPHOS 64  BILITOT 1.2   Hematology Recent Labs Lab 05/23/17 1151  WBC 7.3  RBC 4.66  HGB 13.5  HCT 41.3  MCV 88.6  MCH 29.0  MCHC 32.7  RDW 14.7  PLT 174   Cardiac EnzymesNo results for input(s): TROPONINI in the last 168 hours.  Recent Labs Lab 05/23/17 1205  TROPIPOC 0.01    BNPNo results for input(s): BNP, PROBNP in the last 168 hours.  DDimer No results for input(s): DDIMER in the last 168 hours.  Radiology/Studies:  Ct Head Wo Contrast  Result Date: 05/23/2017 CLINICAL DATA:   Altered level of consciousness. Unsteady gait and feeling "swimmy headed". Hypertension. EXAM: CT HEAD WITHOUT CONTRAST TECHNIQUE: Contiguous axial images were obtained from the base of the skull through the vertex without intravenous contrast. COMPARISON:  02/08/2014 FINDINGS: Brain: No evidence of acute infarction, hemorrhage, hydrocephalus, extra-axial collection or mass lesion/mass effect. Mild for age chronic microvascular ischemic change in the periventricular white matter. Mild generalized cerebral volume loss. Vascular: Atherosclerotic calcification.  No hyperdense vessel. Skull: No acute or aggressive finding. Sinuses/Orbits: Bilateral cataract resection. No acute finding. The mastoid and middle ear spaces are clear. IMPRESSION: No acute finding or explanation for symptoms. Electronically Signed   By: Monte Fantasia M.D.   On: 05/23/2017 12:43    Assessment and Plan:   1. Sinus bradycardia: I believe that many of his complaints can be explained by bradycardia and in general by beta blocker side effects. He may have some degree of underlying sinus node dysfunction which explains his fatigue over the last 2 years, but symptoms are clearly worse while on treatment with metoprolol. This reproduces complaints that he had years ago when he received beta blocker therapy. I have recommended that he gradually wean off metoprolol over the next weekend. 2. HTN: His blood pressure is high. His dose of captopril was decreased when metoprolol was started. Will increase this back up to 50 mg twice a day. Also of note, in the past used take Caduet. He is now taking atorvastatin but the amlodipine ingredient has been discontinued. I asked Mr. and Mrs. Manetta to keep records of his blood pressure over the next week as these medicines are being changed. If his blood pressure remains elevated towards the end of next week, I would restart treatment with amlodipine as well. 3. CAD: His current symptoms do not sound  compatible with coronary insufficiency. He has had stable coronary anatomy for many years and a recent low risk nuclear stress test. He is not taking aspirin since he is on an oral anticoagulant. He is on a statin. He has not had angina in a very long time and takes long-acting nitrates. 4. PAFib: He had an episode of persistent atrial fibrillation that resolved spontaneously, that happened around the time of a GI procedure. His embolic risk is high, CHADSVasc 4 (age 81, CAD, HTN) and he has not had bleeding complications or falls. He should continue taking the oral anticoagulant. If he has recurrent tachyarrhythmia that requires rate control medications, he may need a pacemaker in order to tolerate that  treatment. 5. NSVT: Although relatively lengthy, his episodes of nonsustained VT were not particularly fast and were completely asymptomatic. Although beta blockers might be justified for this arrhythmia, again I don't think he will tolerate them without fact Pacemaker therapy  In summary, it is my impression that the metoprolol is causing symptoms that are worse than the disorders it is designed to treat. Will wean off of it. He has appointment scheduled back in September. I asked him to call the office back sooner with his blood pressure records after the current medication changes have reached steady state (end of next week)   Signed, Sanda Klein, MD  05/23/2017 7:18 PM

## 2017-05-23 NOTE — ED Notes (Signed)
Pt given sandwich, crackers & soda, per request, after passing swallow screen.

## 2017-05-23 NOTE — Telephone Encounter (Signed)
Called and spoke to the patient's wife. She states that the patient's BP has been elevated with most recent readings at 174/82, 162/63, 153/69, 158/69, 114/61, 164/77, 187/90, and 194/89. She states that his HR has been in the 49s. She states that the patient has been taking captopril 25 mg QD, imdur 30 mg QD, and metoprolol 25 mg BID. She states that he has not been thinking clearly and that he has had trouble forming his words. She states that yesterday he took 2 eliquis at one time. She states that he has not been himself and has been more confused. Discussed with APP who felt the patient should be seen in the ER. Made wife aware. She verbalized understanding.

## 2017-05-27 ENCOUNTER — Ambulatory Visit: Payer: Medicare Other | Admitting: Physician Assistant

## 2017-05-27 ENCOUNTER — Telehealth: Payer: Self-pay | Admitting: Cardiovascular Disease

## 2017-05-27 NOTE — Telephone Encounter (Signed)
Spoke with patient's wife who called because she is anxious about patient's BP and HR. Patient was in the ED at [facility] on 8/10 and was advised by Dr. Sallyanne Kuster to stop metoprolol Sunday night to see if his HR will increase once the beta blocker is out of his system. He advised them to monitor patient's BP and call back later this week to report; he states in his note that the patient may need to restart amlodipine. Wife states patient continues to have the same symptoms of fatigue and weakness that he has had for several months.  I have received BP and HR readings from patient's wife and she states she is particularly worried about the BP readings of 536 mmHg systolic. I asked about their process of recording the patient's BP and she states they generally check it immediately after the patient wakes up and before he takes any medication. I advised her to have patient check BP 30 minutes after breakfast and medications and then again in the evening also 30 minutes after eating and taking medications. I advised her that I will ask Dr. Acie Fredrickson to review patient's chart and that I will call back with his recommendations in the next few days. I reassured her that they should continue current plan until I call her back unless symptoms worsen. She verbalized understanding and agreement with plan and thanked me for the call.

## 2017-05-27 NOTE — Telephone Encounter (Signed)
BP has been elevated.  HR is low normal - mildly low Start amlodipine 5 mg a day  Have him watch his salt.

## 2017-05-27 NOTE — Telephone Encounter (Signed)
Follow up   Pt wife stated she left this information earlier, and has been waiting on a call back.  If you can not reach her at home try her on her cell [phone]   Pt c/o BP issue: STAT if pt c/o blurred vision, one-sided weakness or slurred speech  1. What are your last 5 BP readings?  205/92  181/81 151/ 184/79   2. Are you having any other symptoms (ex. Dizziness, headache, blurred vision, passed out)? Light headed   3. What is your BP issue? Fatigued , sleeping a lot , no energy , wife is very concerned with this   She is faxing over the last week worth of bp over also to [phone]

## 2017-05-27 NOTE — Telephone Encounter (Signed)
New message  Pt wife call stating she was returning RN call .please call back to discuss

## 2017-05-28 MED ORDER — AMLODIPINE BESYLATE 5 MG PO TABS: 5.0000 mg | ORAL_TABLET | Freq: Every day | ORAL | 3 refills | Status: DC

## 2017-05-28 NOTE — Telephone Encounter (Signed)
Left message for patient's wife to call back.  

## 2017-05-28 NOTE — Telephone Encounter (Signed)
Spoke with patient's wife to review Dr. Elmarie Shiley advice. She agrees to continue to monitor patient's HR and BP. She requests a sooner follow-up appointment with Dr. Acie Fredrickson per Dr. Victorino December advice while in the hospital. I scheduled patient for 8/22 and advised her to call sooner with questions or concerns. She thanked me for the call.

## 2017-06-02 DIAGNOSIS — K117 Disturbances of salivary secretion: Secondary | ICD-10-CM | POA: Diagnosis not present

## 2017-06-02 DIAGNOSIS — K222 Esophageal obstruction: Secondary | ICD-10-CM | POA: Diagnosis not present

## 2017-06-02 DIAGNOSIS — R413 Other amnesia: Secondary | ICD-10-CM | POA: Diagnosis not present

## 2017-06-02 DIAGNOSIS — I4891 Unspecified atrial fibrillation: Secondary | ICD-10-CM | POA: Diagnosis not present

## 2017-06-02 DIAGNOSIS — H9193 Unspecified hearing loss, bilateral: Secondary | ICD-10-CM | POA: Diagnosis not present

## 2017-06-02 DIAGNOSIS — N400 Enlarged prostate without lower urinary tract symptoms: Secondary | ICD-10-CM | POA: Diagnosis not present

## 2017-06-02 DIAGNOSIS — R001 Bradycardia, unspecified: Secondary | ICD-10-CM | POA: Diagnosis not present

## 2017-06-02 DIAGNOSIS — I10 Essential (primary) hypertension: Secondary | ICD-10-CM | POA: Diagnosis not present

## 2017-06-02 DIAGNOSIS — E119 Type 2 diabetes mellitus without complications: Secondary | ICD-10-CM | POA: Diagnosis not present

## 2017-06-02 DIAGNOSIS — I2510 Atherosclerotic heart disease of native coronary artery without angina pectoris: Secondary | ICD-10-CM | POA: Diagnosis not present

## 2017-06-02 DIAGNOSIS — R2689 Other abnormalities of gait and mobility: Secondary | ICD-10-CM | POA: Diagnosis not present

## 2017-06-03 DIAGNOSIS — H903 Sensorineural hearing loss, bilateral: Secondary | ICD-10-CM | POA: Diagnosis not present

## 2017-06-04 ENCOUNTER — Encounter: Payer: Self-pay | Admitting: Cardiovascular Disease

## 2017-06-04 ENCOUNTER — Ambulatory Visit (INDEPENDENT_AMBULATORY_CARE_PROVIDER_SITE_OTHER): Payer: Medicare Other | Admitting: Cardiovascular Disease

## 2017-06-04 ENCOUNTER — Encounter (INDEPENDENT_AMBULATORY_CARE_PROVIDER_SITE_OTHER): Payer: Self-pay

## 2017-06-04 VITALS — BP 110/56 | HR 75 | Ht 67.0 in | Wt 173.0 lb

## 2017-06-04 DIAGNOSIS — I2510 Atherosclerotic heart disease of native coronary artery without angina pectoris: Secondary | ICD-10-CM | POA: Diagnosis not present

## 2017-06-04 DIAGNOSIS — R5383 Other fatigue: Secondary | ICD-10-CM | POA: Diagnosis not present

## 2017-06-04 DIAGNOSIS — I480 Paroxysmal atrial fibrillation: Secondary | ICD-10-CM

## 2017-06-04 DIAGNOSIS — I10 Essential (primary) hypertension: Secondary | ICD-10-CM | POA: Diagnosis not present

## 2017-06-04 MED ORDER — ISOSORBIDE MONONITRATE ER 30 MG PO TB24: 30.0000 mg | ORAL_TABLET | Freq: Every day | ORAL | 3 refills | Status: DC

## 2017-06-04 MED ORDER — LOSARTAN POTASSIUM 50 MG PO TABS: 50.0000 mg | ORAL_TABLET | Freq: Every day | ORAL | 3 refills | Status: DC

## 2017-06-04 NOTE — Patient Instructions (Addendum)
Medication Instructions:  STOP Captopril (Capoten) START Losartan 50 mg once daily Take Imdur (Isosorbide) 30 mg once daily   Labwork: Your physician recommends that you return for lab work in: 3 weeks for basic metabolic panel and CBC   Testing/Procedures: None Ordered   Follow-Up: Your physician recommends that you schedule a follow-up appointment in: 3 months with Dr. Acie Fredrickson   If you need a refill on your cardiac medications before your next appointment, please call your pharmacy.   Thank you for choosing [facility]! Christen Bame, RN [phone]

## 2017-06-04 NOTE — Progress Notes (Signed)
Cardiology Office Note   Date:  06/04/2017   ID:  Jonathan [name], DOB 08-18-1931, MRN [id number]  PCP:  Josetta Huddle, MD  Cardiologist:   Mertie Moores, MD   Chief Complaint  Patient presents with  . Follow-up    atrial fib    Problem list 1. Atypical chest pain 2. extreme fatigue  3. essential hypertension 4.  Atrial fibrillation  CHADS2VASC = 4   ( age 83, CAD, HTN) 5.  CAD -    History of Present Illness: Jonathan [name] is a 83 y.o. male who presents for atypical CP and extreme fatigue. Feels worn out all of the time .  TSH was checked and was reportedly ok.  Had a cardiac cath 20 years ago Tamala Julian )  - has a chronic total occlusion that was treated medically  Had a 2nd cath about 7 years later that was unchanged.  Is retired from truck driving, Chartered loss adjuster, Estate agent.   Nov. 15, 2016:  Jonathan [name] was seen several months ago for some episodes of chest discomfort. He has known coronary artery disease. He had a [place] study which was normal.  Feb 14, 2017:  Jonathan [name] is seen back today for newly diagnosed atrial fib which was seen during an endoscopy and esophageal dilatation several days ago.  He is still in atrial fib - cannot tel that his HR is irregularly Has had generalized fatigue for the past year  .  He was seen for an esophageal dilatation several days ago. He was noted have an irregular heart rate in the monitor showed atrial fibrillation. He presents today for further evaluation.  No CP.   Still able to do his normal yard work  without any chest pain or shortness of breath.  Aug. 22, 2018:  Mr. Jonathan [name] is seen for follow up . Hx of CAD  I saw him in May. He has had paroxysmal atrial fibrillation. He was in sinus rhythm when he saw Richardson Dopp in June.  Event monitor showed 15 beats of NS VT knee was worked into see Pecolia Ades, nurse practitioner, he was complaining of dizziness. The [place] was stopped at that time and atorvastatin was  started ( Amlodipine component DC'd)   He was seen in the ER on Aug. 10 for fatigue .   His HR was found to be in the 40-50 range - thought to be due to his metoprolol .    Metoprolol was stopped and Captopril was increased to 50 mg BID  Was feeling well Started feeling better yesterday  Feeling well today .    Past Medical History:  Diagnosis Date  . Arthritis   . Atrial fibrillation ([place])   . Borderline diabetes   . Esophageal stricture   . GERD (gastroesophageal reflux disease)   . History of nuclear stress test    Nuclear stress test 6/18: EF 59, small apical defect (?old MI); no ischemia; Low Risk  . Hypertension   . Myocardial infarction ([place])   . Neck fracture ([place]) 2015    Past Surgical History:  Procedure Laterality Date  . BALLOON DILATION  04/02/2012   Procedure: BALLOON DILATION;  Surgeon: Arta Silence, MD;  Location: [facility]COPY;  Service: Endoscopy;  Laterality: N/A;  . BALLOON DILATION N/A 06/16/2013   Procedure: BALLOON DILATION;  Surgeon: Arta Silence, MD;  Location: [facility];  Service: Endoscopy;  Laterality: N/A;  . BALLOON DILATION N/A 11/16/2014   Procedure: BALLOON DILATION;  Surgeon: Arta Silence, MD;  Location:  [facility] ENDOSCOPY;  Service: Endoscopy;  Laterality: N/A;  . BALLOON DILATION N/A 02/13/2017   Procedure: BALLOON DILATION;  Surgeon: Arta Silence, MD;  Location: [facility] ENDOSCOPY;  Service: Endoscopy;  Laterality: N/A;  . CORNEAL TRANSPLANT    . ESOPHAGOGASTRODUODENOSCOPY  04/02/2012   Procedure: ESOPHAGOGASTRODUODENOSCOPY (EGD);  Surgeon: Arta Silence, MD;  Location: [facility];  Service: Endoscopy;  Laterality: N/A;  . ESOPHAGOGASTRODUODENOSCOPY N/A 05/28/2013   Procedure: ESOPHAGOGASTRODUODENOSCOPY (EGD);  Surgeon: Cleotis Nipper, MD;  Location: [facility];  Service: Endoscopy;  Laterality: N/A;  . ESOPHAGOGASTRODUODENOSCOPY N/A 02/20/2015   Procedure: ESOPHAGOGASTRODUODENOSCOPY (EGD);  Surgeon: Arta Silence, MD;  Location: [facility];   Service: Endoscopy;  Laterality: N/A;  . ESOPHAGOGASTRODUODENOSCOPY (EGD) WITH PROPOFOL N/A 06/16/2013   Procedure: ESOPHAGOGASTRODUODENOSCOPY (EGD) WITH PROPOFOL;  Surgeon: Arta Silence, MD;  [facility];  Service: Endoscopy;  Laterality: N/A;  . ESOPHAGOGASTRODUODENOSCOPY (EGD) WITH PROPOFOL N/A 11/16/2014   Procedure: ESOPHAGOGASTRODUODENOSCOPY (EGD) WITH PROPOFOL;  Surgeon: Arta Silence, MD;  [facility];  Service: Endoscopy;  Laterality: N/A;  . ESOPHAGOGASTRODUODENOSCOPY (EGD) WITH PROPOFOL N/A 02/13/2017   Procedure: ESOPHAGOGASTRODUODENOSCOPY (EGD) WITH PROPOFOL;  Surgeon: Arta Silence, MD;  Location: [facility];  Service: Endoscopy;  Laterality: N/A;  . EYE SURGERY     cataract sx  . HERNIA REPAIR     x 2  . TOTAL HIP ARTHROPLASTY     bilateral     Current Outpatient Prescriptions  Medication Sig Dispense Refill  . amLODipine (NORVASC) 5 MG tablet Take 1 tablet (5 mg total) by mouth daily. 90 tablet 3  . apixaban (ELIQUIS) 5 MG TABS tablet Take 1 tablet (5 mg total) by mouth 2 (two) times daily. 60 tablet 11  . atorvastatin (LIPITOR) 10 MG tablet Take 1 tablet (10 mg total) by mouth daily. 90 tablet 1  . captopril (CAPOTEN) 50 MG tablet Take 1 tablet (50 mg total) by mouth 2 (two) times daily. 60 tablet 11  . finasteride (PROSCAR) 5 MG tablet Take 5 mg by mouth every morning.     . halobetasol (ULTRAVATE) 0.05 % ointment Apply 1 application topically 2 (two) times daily as needed (itching or swelling). APPLY TWICE DAILY TO HANDS  0  . isosorbide mononitrate (IMDUR) 30 MG 24 hr tablet Take 30 mg by mouth daily.    Marland Kitchen NITROSTAT 0.4 MG SL tablet Take 0.4 mg by mouth every 5 (five) minutes as needed for chest pain. AS NEEDED FOR CHEST PAIN  0  . omeprazole (PRILOSEC) 20 MG capsule Take 20 mg by mouth daily.     No current facility-administered medications for this visit.     Allergies:   Patient has no known allergies.    Social History:  The patient   reports that he has never smoked. He has never used smokeless tobacco. He reports that he does not drink alcohol or use drugs.   Family History:  The patient's Family history is unknown by patient.    ROS:  Please see the history of present illness.    Review of Systems: Constitutional:  admits to fatigue.  HEENT: denies photophobia, eye pain, redness, hearing loss, ear pain, congestion, sore throat, rhinorrhea, sneezing, neck pain, neck stiffness and tinnitus.  Respiratory: denies SOB, DOE, cough, chest tightness, and wheezing.  Cardiovascular: denies chest pain, palpitations and leg swelling.  Gastrointestinal: denies nausea, vomiting, abdominal pain, diarrhea, constipation, blood in stool.  Genitourinary: denies dysuria, urgency, frequency, hematuria, flank pain and difficulty urinating.  Musculoskeletal: denies  myalgias, back pain, joint  swelling, arthralgias and gait problem.   Skin: denies pallor, rash and wound.  Neurological: denies dizziness, seizures, syncope, weakness, light-headedness, numbness and headaches.   Hematological: denies adenopathy, easy bruising, personal or family bleeding history.  Psychiatric/ Behavioral: denies suicidal ideation, mood changes, confusion, nervousness, sleep disturbance and agitation.       All other systems are reviewed and negative.    PHYSICAL EXAM: VS:  BP (!) 110/56   Pulse 75   Ht 5\' 7"  (1.702 m)   Wt 173 lb (78.5 kg)   BMI 27.10 kg/m  , BMI Body mass index is 27.1 kg/m. GEN: Well nourished, well developed, in no acute distress  HEENT: normal  Neck: no JVD, carotid bruits, or masses Cardiac: RRR; no murmurs, rubs, or gallops,no edema  Respiratory:  clear to auscultation bilaterally, normal work of breathing GI: soft, nontender, nondistended, + BS MS: no deformity or atrophy  Skin: warm and dry, no rash Neuro:  Strength and sensation are intact Psych: normal   EKG:  EKG is ordered today. The ekg ordered Feb 14, 2017:   Atrial fib with rate of 74.   NS ST abn.    Recent Labs: 02/14/2017: TSH 1.080 05/23/2017: ALT 15; BUN 19; Creatinine, Ser 1.05; Hemoglobin 13.5; Platelets 174; Potassium 4.3; Sodium 142    Lipid Panel No results found for: CHOL, TRIG, HDL, CHOLHDL, VLDL, LDLCALC, LDLDIRECT    Wt Readings from Last 3 Encounters:  06/04/17 173 lb (78.5 kg)  04/21/17 173 lb 1.9 oz (78.5 kg)  03/18/17 176 lb (79.8 kg)      Other studies Reviewed: Additional studies/ records that were reviewed today include: . Review of the above records demonstrates:    ASSESSMENT AND PLAN:  1. Atrial fib:    He was recently diagnosed with atrial fibrillation. Has PAF .  We discussed the fact that patients can live with atrial fibrillation but that it does pose a risk of stroke. His CHADS2VASC score is 4.    on Eliquis  5 mg twice a day.   2.  HTN:   Amlodipine has been restarted last week for elevated BP .  He's on captopril 50 mg twice a day. I suspect that this may be giving him very high peak levels and then very low levels. Will DC Captopril and try Losartan  My nurse, Sharyn Lull, had a long discussion with Mr. [name] and his wife after our visit . It sounds like he may not be drinking enough water. We will have an drink more water and see if some of his symptoms are related to volume depletion.  3.  Fatigue :   He feels very fatigued.  Hb on Aug. 10 looked good.   I dont have a good reason for his fatigue  Have asked him to speak to his medical doctor    4.  Coronary artery disease:   Patient has a history of a tight first diagonal stenosis. He was cathed approximate 10 years ago by Dr. Ilda Foil. His EKG shows an old inferior wall myocardial infarction.  His Myoview study showed no perfusion abnormalities. His left ventricle systolic function is normal with an EF of 58%.    Current medicines are reviewed at length with the patient today.  The patient does not have concerns regarding medicines.  The  following changes have been made:  no change  Labs/ tests ordered today include:  No orders of the defined types were placed in this encounter.   Disposition:  FU with me in 6 months   Mertie Moores, MD  06/04/2017 2:00 PM    [address], [place], [place]  [address] Phone: [phone]; Fax: [phone]

## 2017-06-23 ENCOUNTER — Other Ambulatory Visit: Payer: Medicare Other | Admitting: *Deleted

## 2017-06-23 DIAGNOSIS — I10 Essential (primary) hypertension: Secondary | ICD-10-CM

## 2017-06-23 DIAGNOSIS — R5383 Other fatigue: Secondary | ICD-10-CM

## 2017-06-23 DIAGNOSIS — I480 Paroxysmal atrial fibrillation: Secondary | ICD-10-CM

## 2017-06-24 ENCOUNTER — Telehealth: Payer: Self-pay | Admitting: Nurse Practitioner

## 2017-06-24 LAB — CBC
Hematocrit: 44.3 % (ref 37.5–51.0)
Hemoglobin: 14.6 g/dL (ref 13.0–17.7)
MCH: 28.9 pg (ref 26.6–33.0)
MCHC: 33 g/dL (ref 31.5–35.7)
MCV: 88 fL (ref 79–97)
Platelets: 190 10*3/uL (ref 150–379)
RBC: 5.06 x10E6/uL (ref 4.14–5.80)
RDW: 14.3 % (ref 12.3–15.4)
WBC: 10.3 10*3/uL (ref 3.4–10.8)

## 2017-06-24 LAB — BASIC METABOLIC PANEL
BUN / CREAT RATIO: 16 (ref 10–24)
BUN: 14 mg/dL (ref 8–27)
CHLORIDE: 104 mmol/L (ref 96–106)
CO2: 24 mmol/L (ref 20–29)
Calcium: 8.9 mg/dL (ref 8.6–10.2)
Creatinine, Ser: 0.89 mg/dL (ref 0.76–1.27)
GFR calc non Af Amer: 79 mL/min/{1.73_m2} (ref >59)
GFR, EST AFRICAN AMERICAN: 91 mL/min/{1.73_m2} (ref >59)
Glucose: 101 mg/dL — ABNORMAL HIGH (ref 65–99)
POTASSIUM: 4.7 mmol/L (ref 3.5–5.2)
Sodium: 142 mmol/L (ref 134–144)

## 2017-06-24 NOTE — Telephone Encounter (Signed)
Reviewed lab results with patient's wife who verbalized understanding. She brought patient's BP and HR readings and I advised her that readings look good and no changes to medical therapy. She states patient continues to be fatigued through the day and asks if this could be caused by sleep apnea. She states patient used to snore but now she does not know if he snores because she sleeps so soundly. I offered a referral to sleep specialist and she states patient's preferred sleep specialist would be Dr. Maxwell Caul.  I advised her to call his office and to let us know if she needs any additional help. She was very thankful for the call.

## 2017-07-01 DIAGNOSIS — L570 Actinic keratosis: Secondary | ICD-10-CM | POA: Diagnosis not present

## 2017-07-01 DIAGNOSIS — Z85828 Personal history of other malignant neoplasm of skin: Secondary | ICD-10-CM | POA: Diagnosis not present

## 2017-07-01 DIAGNOSIS — D485 Neoplasm of uncertain behavior of skin: Secondary | ICD-10-CM | POA: Diagnosis not present

## 2017-07-01 DIAGNOSIS — L821 Other seborrheic keratosis: Secondary | ICD-10-CM | POA: Diagnosis not present

## 2017-07-01 DIAGNOSIS — C44629 Squamous cell carcinoma of skin of left upper limb, including shoulder: Secondary | ICD-10-CM | POA: Diagnosis not present

## 2017-07-09 ENCOUNTER — Ambulatory Visit: Payer: Medicare Other | Admitting: Cardiovascular Disease

## 2017-07-14 ENCOUNTER — Encounter: Payer: Self-pay | Admitting: Neurology

## 2017-07-14 ENCOUNTER — Ambulatory Visit (INDEPENDENT_AMBULATORY_CARE_PROVIDER_SITE_OTHER): Payer: Medicare Other | Admitting: Neurology

## 2017-07-14 VITALS — BP 146/74 | HR 60 | Ht 67.0 in | Wt 174.0 lb

## 2017-07-14 DIAGNOSIS — R413 Other amnesia: Secondary | ICD-10-CM | POA: Diagnosis not present

## 2017-07-14 DIAGNOSIS — R269 Unspecified abnormalities of gait and mobility: Secondary | ICD-10-CM

## 2017-07-14 DIAGNOSIS — I2510 Atherosclerotic heart disease of native coronary artery without angina pectoris: Secondary | ICD-10-CM

## 2017-07-14 NOTE — Progress Notes (Signed)
Subjective:    [name] ID: Jonathan [name] is a 83 y.o. male.  HPI     [name], MD, PhD [facility] [address] [place], [place] [address]  Dear Dr. Inda Merlin,   I saw your [name], Jonathan [name], upon your kind request in my neurologic clinic today for initial consultation of his gait disorder and memory complaints, concern for parkinsonism. Jonathan [name] is accompanied by his wife today. As you know, Jonathan [name] is an 83 year old right-handed gentleman with an underlying complex medical history of hypertension, A. fib, coronary artery disease, arthritis, borderline diabetes, reflux disease, status post multiple surgeries including bilateral hip arthroplasties, hernia repairs, cataract surgeries, corneal transplant, multiple EGDs, and mildly overweight state, who reports recent difficulty with maneuvering stairs, he had difficulty finishing sentences and felt that he was drooling more. He is worried that he may have had a stroke. He has memory loss for Jonathan past year or longer. I reviewed your office note from 06/02/2017. He had blood work at Jonathan time including B12 and TSH. He also had an A1c and BMP check at Jonathan time, we will request test results from your office., Which you kindly included. His wife reports that he has had motor problems for over one year, and attributes some of this to fatigue and due to new onset a fib. This was noted in May, after a recent procedure of esophageal dilatation. He did not require cardioversion, but has had PAF.  He has fallen, has a walker, but does not use it. Wife has taken over Jonathan driving in Jonathan past month, as he recently got confused inside his neighborhood.  During a recent ER visit she says, his heart rate was in Jonathan 40s.   He had a recent [place] wo contrast on 05/23/17, which I reviewed: IMPRESSION: No acute finding or explanation for symptoms. He had a high cervical Fx some years ago, saw Dr. Joya Salm at Jonathan  time.  With respect to speech and gait instability, he and his wife report that he is better. He has new hearing aids too.   His Past Medical History Is Significant For: Past Medical History:  Diagnosis Date  . Acne rosacea   . Arthritis   . Atrial fibrillation ([place])   . Borderline diabetes   . CAD (coronary artery disease)   . Diabetes mellitus without complication ([place])   . Diverticulosis   . Elevated PSA   . Esophageal stricture   . GERD (gastroesophageal reflux disease)   . History of nuclear stress test    Nuclear stress test 6/18: EF 59, small apical defect (?old MI); no ischemia; Low Risk  . Hypertension   . Myocardial infarction ([place])   . Neck fracture ([place]) 2015  . Shoulder pain     His Past Surgical History Is Significant For: Past Surgical History:  Procedure Laterality Date  . BALLOON DILATION  04/02/2012   Procedure: BALLOON DILATION;  Surgeon: Arta Silence, MD;  [facility];  Service: Endoscopy;  Laterality: N/A;  . BALLOON DILATION N/A 06/16/2013   Procedure: BALLOON DILATION;  Surgeon: Arta Silence, MD;  [facility];  Service: Endoscopy;  Laterality: N/A;  . BALLOON DILATION N/A 11/16/2014   Procedure: BALLOON DILATION;  Surgeon: Arta Silence, MD;  [facility];  Service: Endoscopy;  Laterality: N/A;  . BALLOON DILATION N/A 02/13/2017   Procedure: BALLOON DILATION;  Surgeon: Arta Silence, MD;  Location: [facility] ENDOSCOPY;  Service: Endoscopy;  Laterality: N/A;  .  CORNEAL TRANSPLANT    . ESOPHAGOGASTRODUODENOSCOPY  04/02/2012   Procedure: ESOPHAGOGASTRODUODENOSCOPY (EGD);  Surgeon: Arta Silence, MD;  Location: [facility];  Service: Endoscopy;  Laterality: N/A;  . ESOPHAGOGASTRODUODENOSCOPY N/A 05/28/2013   Procedure: ESOPHAGOGASTRODUODENOSCOPY (EGD);  Surgeon: Cleotis Nipper, MD;  Location: [facility];  Service: Endoscopy;  Laterality: N/A;  . ESOPHAGOGASTRODUODENOSCOPY N/A 02/20/2015   Procedure: ESOPHAGOGASTRODUODENOSCOPY  (EGD);  Surgeon: Arta Silence, MD;  Location: [facility];  Service: Endoscopy;  Laterality: N/A;  . ESOPHAGOGASTRODUODENOSCOPY (EGD) WITH PROPOFOL N/A 06/16/2013   Procedure: ESOPHAGOGASTRODUODENOSCOPY (EGD) WITH PROPOFOL;  Surgeon: Arta Silence, MD;  [facility];  Service: Endoscopy;  Laterality: N/A;  . ESOPHAGOGASTRODUODENOSCOPY (EGD) WITH PROPOFOL N/A 11/16/2014   Procedure: ESOPHAGOGASTRODUODENOSCOPY (EGD) WITH PROPOFOL;  Surgeon: Arta Silence, MD;  [facility];  Service: Endoscopy;  Laterality: N/A;  . ESOPHAGOGASTRODUODENOSCOPY (EGD) WITH PROPOFOL N/A 02/13/2017   Procedure: ESOPHAGOGASTRODUODENOSCOPY (EGD) WITH PROPOFOL;  Surgeon: Arta Silence, MD;  Location: [facility];  Service: Endoscopy;  Laterality: N/A;  . EYE SURGERY     cataract sx  . HERNIA REPAIR     x 2  . TOTAL HIP ARTHROPLASTY     bilateral    His Family History Is Significant For: Family History  Problem Relation Age of Onset  . Family history unknown: Yes    His Social History Is Significant For: Social History   Social History  . Marital status: Married    Spouse name: N/A  . Number of children: N/A  . Years of education: N/A   Social History Main Topics  . Smoking status: Never Smoker  . Smokeless tobacco: Never Used  . Alcohol use No  . Drug use: No  . Sexual activity: Not Asked   Other Topics Concern  . None   Social History Narrative  . None    His Allergies Are:  No Known Allergies:   His Current Medications Are:  Outpatient Encounter Prescriptions as of 07/14/2017  Medication Sig  . amLODipine (NORVASC) 5 MG tablet Take 1 tablet (5 mg total) by mouth daily.  Marland Kitchen apixaban (ELIQUIS) 5 MG TABS tablet Take 1 tablet (5 mg total) by mouth 2 (two) times daily.  Marland Kitchen atorvastatin (LIPITOR) 10 MG tablet Take 1 tablet (10 mg total) by mouth daily.  . finasteride (PROSCAR) 5 MG tablet Take 5 mg by mouth every morning.   . halobetasol (ULTRAVATE) 0.05 % ointment Apply 1  application topically 2 (two) times daily as needed (itching or swelling). APPLY TWICE DAILY TO HANDS  . isosorbide mononitrate (IMDUR) 30 MG 24 hr tablet Take 1 tablet (30 mg total) by mouth daily.  Marland Kitchen losartan (COZAAR) 50 MG tablet Take 1 tablet (50 mg total) by mouth daily.  Marland Kitchen NITROSTAT 0.4 MG SL tablet Take 0.4 mg by mouth every 5 (five) minutes as needed for chest pain. AS NEEDED FOR CHEST PAIN  . omeprazole (PRILOSEC) 20 MG capsule Take 20 mg by mouth daily.  . [DISCONTINUED] captopril (CAPOTEN) 50 MG tablet Take 50 mg by mouth 2 (two) times daily.   No facility-administered encounter medications on file as of 07/14/2017.   :   Review of Systems:  Out of a complete 14 point review of systems, all are reviewed and negative with Jonathan exception of these symptoms as listed below:  Review of Systems  Neurological:       Pt presents today to discuss his memory and drooling. Pt thinks that he may have had a stroke in Jonathan past because his  symptoms are resolving. Pt reports that he could not finish sentences, drools, and sometimes could not climb stairs.    Objective:  Neurological Exam  Physical Exam Physical Examination:   Vitals:   07/14/17 0951  BP: (!) 146/74  Pulse: 60    General Examination: Jonathan [name] is a very pleasant 83 y.o. male in no acute distress. He appears well-developed and well-nourished and well groomed.   HEENT: Normocephalic, atraumatic, pupils are equal, round and reactive to light and accommodation. Trifocal glasses in place. Funduscopic exam is normal with sharp disc margins noted. Extraocular tracking is good without limitation to gaze excursion or nystagmus noted. Normal smooth pursuit is noted. Hearing is impaired mildly with bilateral hearing aids in place. Face is symmetric with normal facial animation and normal facial sensation. Speech is clear with no dysarthria noted. There is no hypophonia. There is no lip, neck/head, jaw or voice tremor. Neck is supple  with full range of passive and active motion. There are no carotid bruits on auscultation. Oropharynx exam reveals: mild mouth dryness, adequate dental hygiene. Mallampati is class II. Tongue protrudes centrally and palate elevates symmetrically.   Chest: Clear to auscultation without wheezing, rhonchi or crackles noted.  Heart: S1+S2+0, regular and normal without murmurs, rubs or gallops noted.   Abdomen: Soft, non-tender and non-distended with normal bowel sounds appreciated on auscultation.  Extremities: There is trace pitting edema in Jonathan distal lower extremities bilaterally, right more than left.   Skin: Warm and very dry with chronic sun exposure type changes and age-related changes noted in both forearms and distal legs.   Musculoskeletal: exam reveals no obvious joint deformities, tenderness or joint swelling or erythema.   Neurologically:  Mental status: Jonathan [name] is awake, alert and oriented in all 4 spheres. His immediate and remote memory, attention, language skills and fund of knowledge are fairly appropriate. There is no evidence of aphasia, agnosia, apraxia or anomia. Speech is clear with normal prosody and enunciation. Thought process is linear. Mood is normal and affect is normal.   On 07/14/2017: MMSE: 22/30, CDT: 4/4, AFT: 10/min.   Cranial nerves II - XII are as described above under HEENT exam. In addition: shoulder shrug is normal with equal shoulder height noted. Motor exam: Normal bulk, strength and tone is noted. There is no drift, tremor or rebound. Romberg is not tested for safety. Reflexes are 1+ in Jonathan upper extremities and absent in Jonathan lower extremities. Fine motor skills are globally mildly impaired, he has no resting tremor. Heel-to-shin is not possible. Cerebellar testing: No dysmetria or intention tremor. There is no truncal or gait ataxia.  Sensory exam: intact to light touch in Jonathan upper and lower extremities.  Gait, station and balance: He stands with  mild difficulty, posture is age-appropriate, he walks with preserved arm swing, no shuffling noted. Balance is mildly impaired, particularly with turns. Tandem walk is not possible safely.   Assessment and Plan:   In summary, Barney [name] is a very pleasant 83 y.o.-year old male with an underlying complex medical history of hypertension, A. fib, coronary artery disease, arthritis, borderline diabetes, reflux disease, status post multiple surgeries including bilateral hip arthroplasties, hernia repairs, cataract surgeries, corneal transplant, multiple EGDs, and mildly overweight state, who presents for difficulty with his walking, balance, history of drooling and difficulty navigating stairs recently. He has had memory loss in Jonathan past few months but per wife has improved in that regard. His memory score is mildly impaired. He is at risk for  vascular dementia. He does not have any signs of parkinsonism and is reassured in that regard. I suggested we proceed with a brain MRI without contrast as he also has vascular risk factors for TIA and stroke. He has a nonspecific insecurity with walking, he is advised to start using his walker. He does have a walker at home but does not use it. We talked about overall gait safety, healthy lifestyle, better hydration with water. I encouraged Jonathan [name] to eat healthy, exercise daily and keep well hydrated, to keep a scheduled bedtime and wake time routine, to not skip any meals and eat healthy snacks in between meals and to have protein with every meal.   Overall, reassuringly, he feels that he has improved in Jonathan past couple of months. As far as further diagnostic testing is concerned, I suggested Jonathan following today: MRI brain wo contrast. We will call him with his test results. I suggested follow-up based on test results. I answered all their questions today and Jonathan [name] and his wife were in agreement.  Thank you very much for allowing me to participate in Jonathan  care of this nice [name]. If I can be of any further assistance to you please do not hesitate to call me at [phone].  Sincerely,   [name], MD, PhD

## 2017-07-14 NOTE — Patient Instructions (Signed)
  I do not see any signs or symptoms of parkinson's like disease or what we call parkinsonism.   You had recent issues with your gait and balance. Please consider using your walker.   I do want to suggest a few things today:  Remember to drink plenty of fluid at least 6 glasses (8 oz each), eat healthy meals and do not skip any meals. Try to eat protein with a every meal and eat a healthy snack such as fruit or nuts in between meals. Try to keep a regular sleep-wake schedule and try to exercise daily, particularly in the form of walking, 20-30 minutes a day, if you can.   You may need to start using a walker for gait safety, as you have fallen before and are at higher risk for bleeding and injury, based on prior history of fall, taking a blood thinner. I do believe you are at fall risk.   Try to stay active physically and mentally. Engage in social activities in your community and with your family and try to keep up with current events by reading the newspaper or watching the news. Try to do word puzzles and you may like to do puzzles and brain games on the computer such as on [email or web address]/.   As far as diagnostic testing, I will order an brain MRI. We would have to schedule you for this on a separate date. This test requires authorization from your insurance, and we will take care of the insurance process.  I would like to see you back as needed, so long as your MRI is reassuring/age appropriate.

## 2017-07-16 ENCOUNTER — Other Ambulatory Visit: Payer: Self-pay | Admitting: Neurology

## 2017-07-16 DIAGNOSIS — T1590XA Foreign body on external eye, part unspecified, unspecified eye, initial encounter: Secondary | ICD-10-CM

## 2017-07-18 ENCOUNTER — Ambulatory Visit (INDEPENDENT_AMBULATORY_CARE_PROVIDER_SITE_OTHER): Payer: Medicare Other | Admitting: Podiatry

## 2017-07-18 ENCOUNTER — Encounter: Payer: Self-pay | Admitting: Podiatry

## 2017-07-18 DIAGNOSIS — B351 Tinea unguium: Secondary | ICD-10-CM

## 2017-07-18 DIAGNOSIS — M79676 Pain in unspecified toe(s): Secondary | ICD-10-CM | POA: Diagnosis not present

## 2017-07-18 DIAGNOSIS — D689 Coagulation defect, unspecified: Secondary | ICD-10-CM

## 2017-07-18 NOTE — Progress Notes (Addendum)
Patient ID: Jonathan [name], male   DOB: 1931-07-25, 83 y.o.   MRN: [id number] Complaint:  Visit Type: Patient returns to my office for continued preventative foot care services. Complaint: Patient states" my nails have grown long and thick and become painful to walk and wear shoes". The patient presents for preventative foot care services. No changes to ROS.  Patient on eliquiss.  Podiatric Exam: Vascular: dorsalis pedis and posterior tibial pulses are palpable bilateral. Capillary return is immediate. Temperature gradient is WNL. Skin turgor WNL  Sensorium: Normal Semmes Weinstein monofilament test. Normal tactile sensation bilaterally. Nail Exam: Pt has thick disfigured discolored nails with subungual debris noted bilateral entire nail hallux through fifth toenails Ulcer Exam: There is no evidence of ulcer or pre-ulcerative changes or infection. Orthopedic Exam: Muscle tone and strength are WNL. No limitations in general ROM. No crepitus or effusions noted. Foot type and digits show no abnormalities. Bony prominences are unremarkable. Skin: No Porokeratosis. No infection or ulcers  Diagnosis:  Onychomycosis, , Pain in right toe, pain in left toes  Treatment & Plan Procedures and Treatment: Consent by patient was obtained for treatment procedures. The patient understood the discussion of treatment and procedures well. All questions were answered thoroughly reviewed. Debridement of mycotic and hypertrophic toenails, 1 through 5 bilateral and clearing of subungual debris. No ulceration, no infection noted. Signed ABN for 2018. Return Visit-Office Procedure: Patient instructed to return to the office for a follow up visit 3 months for continued evaluation and treatment.   Gardiner Barefoot DPM

## 2017-07-29 ENCOUNTER — Ambulatory Visit
Admission: RE | Admit: 2017-07-29 | Discharge: 2017-07-29 | Disposition: A | Discharge status: Home / Self Care | Payer: Medicare Other | Source: Ambulatory Visit | Attending: Neurology | Admitting: Neurology

## 2017-07-29 DIAGNOSIS — R269 Unspecified abnormalities of gait and mobility: Secondary | ICD-10-CM | POA: Diagnosis not present

## 2017-07-29 DIAGNOSIS — R413 Other amnesia: Secondary | ICD-10-CM

## 2017-07-30 ENCOUNTER — Telehealth: Payer: Self-pay | Admitting: Neurology

## 2017-07-30 NOTE — Progress Notes (Signed)
Please call patient regarding the recent brain MRI: The brain scan showed a normal structure of the brain and mild volume loss which we call atrophy, but deemed age appropriate in his case. There were changes in the deeper structures of the brain, which we call white matter changes or microvascular changes. These were reported as mild in His case, also in keeping with his age. These are tiny white spots, that occur with time and are seen in a variety of conditions, including with normal aging, chronic hypertension, chronic headaches, especially migraine HAs, chronic diabetes, chronic hyperlipidemia. These are not strokes and no mass or lesion were seen which is reassuring. Again, there were no acute findings, such as a stroke, or mass or blood products and thankfully, overall age appropriate findings, which is good news. No further action is required on this test at this time, other than re-enforcing the importance of good blood pressure control, good cholesterol control, good blood sugar control, and weight management. Please remind patient to keep any upcoming appointments or tests and to call us with any interim questions, concerns, problems or updates. Thanks,  [name], MD, PhD

## 2017-07-30 NOTE — Telephone Encounter (Signed)
Called patient to discuss MRI results. No answer at this time. LVM for patient to call back.

## 2017-07-30 NOTE — Telephone Encounter (Signed)
-----   Message from [name], MD sent at 07/30/2017  7:37 AM EDT ----- Please call patient regarding the recent brain MRI: The brain scan showed a normal structure of the brain and mild volume loss which we call atrophy, but deemed age appropriate in his case. There were changes in the deeper structures of the brain, which we call white matter changes or microvascular changes. These were reported as mild in His case, also in keeping with his age. These are tiny white spots, that occur with time and are seen in a variety of conditions, including with normal aging, chronic hypertension, chronic headaches, especially migraine HAs, chronic diabetes, chronic hyperlipidemia. These are not strokes and no mass or lesion were seen which is reassuring. Again, there were no acute findings, such as a stroke, or mass or blood products and thankfully, overall age appropriate findings, which is good news. No further action is required on this test at this time, other than re-enforcing the importance of good blood pressure control, good cholesterol control, good blood sugar control, and weight management. Please remind patient to keep any upcoming appointments or tests and to call us with any interim questions, concerns, problems or updates. Thanks,  [name], MD, PhD

## 2017-07-31 NOTE — Telephone Encounter (Signed)
Pt's wife returned RN's call °

## 2017-07-31 NOTE — Telephone Encounter (Signed)
Called the patient back and him and his wife answered. They placed me on speaker phone and I was able to go over the MRI results. I informed in detail about the test and what it showed. I went over education about maintaining good blood pressure control and good cholesterol, good blood sugar control. Pt and his wife verbalized understanding. They had no further questions.

## 2017-08-13 DIAGNOSIS — K222 Esophageal obstruction: Secondary | ICD-10-CM | POA: Diagnosis not present

## 2017-08-13 DIAGNOSIS — I10 Essential (primary) hypertension: Secondary | ICD-10-CM | POA: Diagnosis not present

## 2017-08-13 DIAGNOSIS — H9193 Unspecified hearing loss, bilateral: Secondary | ICD-10-CM | POA: Diagnosis not present

## 2017-08-13 DIAGNOSIS — R001 Bradycardia, unspecified: Secondary | ICD-10-CM | POA: Diagnosis not present

## 2017-08-13 DIAGNOSIS — I25119 Atherosclerotic heart disease of native coronary artery with unspecified angina pectoris: Secondary | ICD-10-CM | POA: Diagnosis not present

## 2017-08-13 DIAGNOSIS — Z23 Encounter for immunization: Secondary | ICD-10-CM | POA: Diagnosis not present

## 2017-08-13 DIAGNOSIS — R2689 Other abnormalities of gait and mobility: Secondary | ICD-10-CM | POA: Diagnosis not present

## 2017-08-13 DIAGNOSIS — I4891 Unspecified atrial fibrillation: Secondary | ICD-10-CM | POA: Diagnosis not present

## 2017-08-13 DIAGNOSIS — N400 Enlarged prostate without lower urinary tract symptoms: Secondary | ICD-10-CM | POA: Diagnosis not present

## 2017-08-13 DIAGNOSIS — E785 Hyperlipidemia, unspecified: Secondary | ICD-10-CM | POA: Diagnosis not present

## 2017-08-13 DIAGNOSIS — I2510 Atherosclerotic heart disease of native coronary artery without angina pectoris: Secondary | ICD-10-CM | POA: Diagnosis not present

## 2017-08-13 DIAGNOSIS — R413 Other amnesia: Secondary | ICD-10-CM | POA: Diagnosis not present

## 2017-08-13 DIAGNOSIS — I209 Angina pectoris, unspecified: Secondary | ICD-10-CM | POA: Diagnosis not present

## 2017-08-13 DIAGNOSIS — E1165 Type 2 diabetes mellitus with hyperglycemia: Secondary | ICD-10-CM | POA: Diagnosis not present

## 2017-08-13 DIAGNOSIS — E119 Type 2 diabetes mellitus without complications: Secondary | ICD-10-CM | POA: Diagnosis not present

## 2017-08-13 DIAGNOSIS — K117 Disturbances of salivary secretion: Secondary | ICD-10-CM | POA: Diagnosis not present

## 2017-08-13 DIAGNOSIS — I252 Old myocardial infarction: Secondary | ICD-10-CM | POA: Diagnosis not present

## 2017-08-29 DIAGNOSIS — R311 Benign essential microscopic hematuria: Secondary | ICD-10-CM | POA: Diagnosis not present

## 2017-08-29 DIAGNOSIS — N5201 Erectile dysfunction due to arterial insufficiency: Secondary | ICD-10-CM | POA: Diagnosis not present

## 2017-08-29 DIAGNOSIS — R972 Elevated prostate specific antigen [PSA]: Secondary | ICD-10-CM | POA: Diagnosis not present

## 2017-08-29 DIAGNOSIS — N401 Enlarged prostate with lower urinary tract symptoms: Secondary | ICD-10-CM | POA: Diagnosis not present

## 2017-08-29 DIAGNOSIS — R351 Nocturia: Secondary | ICD-10-CM | POA: Diagnosis not present

## 2017-09-03 ENCOUNTER — Encounter (INDEPENDENT_AMBULATORY_CARE_PROVIDER_SITE_OTHER): Payer: Self-pay

## 2017-09-03 ENCOUNTER — Ambulatory Visit (INDEPENDENT_AMBULATORY_CARE_PROVIDER_SITE_OTHER): Payer: Medicare Other | Admitting: Cardiovascular Disease

## 2017-09-03 ENCOUNTER — Encounter: Payer: Self-pay | Admitting: Cardiovascular Disease

## 2017-09-03 VITALS — BP 140/60 | HR 66 | Ht 67.0 in | Wt 178.4 lb

## 2017-09-03 DIAGNOSIS — I10 Essential (primary) hypertension: Secondary | ICD-10-CM

## 2017-09-03 DIAGNOSIS — I2510 Atherosclerotic heart disease of native coronary artery without angina pectoris: Secondary | ICD-10-CM

## 2017-09-03 NOTE — Patient Instructions (Signed)
Medication Instructions:  Your physician recommends that you continue on your current medications as directed. Please refer to the Current Medication list given to you today.   Labwork: None Ordered   Testing/Procedures: None Ordered   Follow-Up: Your physician wants you to follow-up in: 6 months with Dr. Nahser.  You will receive a reminder letter in the mail two months in advance. If you don't receive a letter, please call our office to schedule the follow-up appointment.   If you need a refill on your cardiac medications before your next appointment, please call your pharmacy.   Thank you for choosing [facility]! Arriel Victor, RN [phone]    

## 2017-09-03 NOTE — Progress Notes (Signed)
Cardiology Office Note   Date:  09/03/2017   ID:  Jonathan [name], DOB June 16, 1931, MRN [id number]  PCP:  No primary care provider on file.  Cardiologist:   Mertie Moores, MD   Chief Complaint  Patient presents with  . Follow-up    Afib, HTN, CAD    Problem list 1. Atypical chest pain 2. extreme fatigue  3. essential hypertension 4.  Atrial fibrillation  CHADS2VASC = 4   ( age 83, CAD, HTN) 5.  CAD -    History of Present Illness: Jonathan [name] is a 83 y.o. male who presents for atypical CP and extreme fatigue. Feels worn out all of the time .  TSH was checked and was reportedly ok.  Had a cardiac cath 20 years ago Tamala Julian )  - has a chronic total occlusion that was treated medically  Had a 2nd cath about 7 years later that was unchanged.  Is retired from truck driving, Chartered loss adjuster, Estate agent.   Nov. 15, 2016:  Jonathan [name] was seen several months ago for some episodes of chest discomfort. He has known coronary artery disease. He had a [place] study which was normal.  Feb 14, 2017:  Jonathan [name] is seen back today for newly diagnosed atrial fib which was seen during an endoscopy and esophageal dilatation several days ago.  He is still in atrial fib - cannot tel that his HR is irregularly Has had generalized fatigue for the past year  .  He was seen for an esophageal dilatation several days ago. He was noted have an irregular heart rate in the monitor showed atrial fibrillation. He presents today for further evaluation.  No CP.   Still able to do his normal yard work  without any chest pain or shortness of breath.  Aug. 22, 2018:  Jonathan [name] is seen for follow up . Hx of CAD  I saw him in May. He has had paroxysmal atrial fibrillation. He was in sinus rhythm when he saw Jonathan [name] in June.  Event monitor showed 15 beats of NS VT knee was worked into see Pecolia Ades, nurse practitioner, he was complaining of dizziness. The [place] was stopped at that time and  atorvastatin was started ( Amlodipine component DC'd)   He was seen in the ER on Aug. 10 for fatigue .   His HR was found to be in the 40-50 range - thought to be due to his metoprolol .    Metoprolol was stopped and Captopril was increased to 50 mg BID  Was feeling well Started feeling better yesterday  Feeling well today .    September 03, 2017: Jonathan [name] seems to be feeling a little bit better today.  We changed his captopril to losartan and he seems to be tolerating that fairly well. His blood pressure at home seems to be in the normal range. He is drinking a little bit more water.  Primary MD has changed his atorvastatin to 10 mg M,W,F    Past Medical History:  Diagnosis Date  . Acne rosacea   . Arthritis   . Atrial fibrillation ([place])   . Borderline diabetes   . CAD (coronary artery disease)   . Diabetes mellitus without complication ([place])   . Diverticulosis   . Elevated PSA   . Esophageal stricture   . GERD (gastroesophageal reflux disease)   . History of nuclear stress test    Nuclear stress test 6/18: EF 59, small apical defect (?old MI); no ischemia; Low Risk  .  Hypertension   . Myocardial infarction ([place])   . Neck fracture ([place]) 2015  . Shoulder pain     Past Surgical History:  Procedure Laterality Date  . BALLOON DILATION  04/02/2012   Procedure: BALLOON DILATION;  Surgeon: Arta Silence, MD;  Location: [facility] ENDOSCOPY;  Service: Endoscopy;  Laterality: N/A;  . BALLOON DILATION N/A 06/16/2013   Procedure: BALLOON DILATION;  Surgeon: Arta Silence, MD;  Location: [facility] ENDOSCOPY;  Service: Endoscopy;  Laterality: N/A;  . BALLOON DILATION N/A 11/16/2014   Procedure: BALLOON DILATION;  Surgeon: Arta Silence, MD;  Location: [facility] ENDOSCOPY;  Service: Endoscopy;  Laterality: N/A;  . BALLOON DILATION N/A 02/13/2017   Procedure: BALLOON DILATION;  Surgeon: Arta Silence, MD;  Location: [facility] ENDOSCOPY;  Service: Endoscopy;  Laterality: N/A;  . CORNEAL TRANSPLANT    .  ESOPHAGOGASTRODUODENOSCOPY  04/02/2012   Procedure: ESOPHAGOGASTRODUODENOSCOPY (EGD);  Surgeon: Arta Silence, MD;  Location: [facility];  Service: Endoscopy;  Laterality: N/A;  . ESOPHAGOGASTRODUODENOSCOPY N/A 05/28/2013   Procedure: ESOPHAGOGASTRODUODENOSCOPY (EGD);  Surgeon: Cleotis Nipper, MD;  Location: [facility];  Service: Endoscopy;  Laterality: N/A;  . ESOPHAGOGASTRODUODENOSCOPY N/A 02/20/2015   Procedure: ESOPHAGOGASTRODUODENOSCOPY (EGD);  Surgeon: Arta Silence, MD;  Location: [facility];  Service: Endoscopy;  Laterality: N/A;  . ESOPHAGOGASTRODUODENOSCOPY (EGD) WITH PROPOFOL N/A 06/16/2013   Procedure: ESOPHAGOGASTRODUODENOSCOPY (EGD) WITH PROPOFOL;  Surgeon: Arta Silence, MD;  Location: [facility];  Service: Endoscopy;  Laterality: N/A;  . ESOPHAGOGASTRODUODENOSCOPY (EGD) WITH PROPOFOL N/A 11/16/2014   Procedure: ESOPHAGOGASTRODUODENOSCOPY (EGD) WITH PROPOFOL;  Surgeon: Arta Silence, MD;  Location: [facility];  Service: Endoscopy;  Laterality: N/A;  . ESOPHAGOGASTRODUODENOSCOPY (EGD) WITH PROPOFOL N/A 02/13/2017   Procedure: ESOPHAGOGASTRODUODENOSCOPY (EGD) WITH PROPOFOL;  Surgeon: Arta Silence, MD;  Location: [facility];  Service: Endoscopy;  Laterality: N/A;  . EYE SURGERY     cataract sx  . HERNIA REPAIR     x 2  . TOTAL HIP ARTHROPLASTY     bilateral     Current Outpatient Medications  Medication Sig Dispense Refill  . amLODipine (NORVASC) 5 MG tablet Take 1 tablet (5 mg total) by mouth daily. 90 tablet 3  . apixaban (ELIQUIS) 5 MG TABS tablet Take 1 tablet (5 mg total) by mouth 2 (two) times daily. 60 tablet 11  . atorvastatin (LIPITOR) 10 MG tablet Take 10 mg tablet by mouth Monday, Wednesday and friday    . finasteride (PROSCAR) 5 MG tablet Take 5 mg by mouth every morning.     . halobetasol (ULTRAVATE) 0.05 % ointment Apply 1 application topically 2 (two) times daily as needed (itching or swelling). APPLY TWICE DAILY TO HANDS  0  . isosorbide mononitrate  (IMDUR) 30 MG 24 hr tablet Take 1 tablet (30 mg total) by mouth daily. 90 tablet 3  . losartan (COZAAR) 50 MG tablet Take 1 tablet (50 mg total) by mouth daily. 90 tablet 3  . NITROSTAT 0.4 MG SL tablet Take 0.4 mg by mouth every 5 (five) minutes as needed for chest pain. AS NEEDED FOR CHEST PAIN  0  . omeprazole (PRILOSEC) 20 MG capsule Take 20 mg by mouth daily.     No current facility-administered medications for this visit.     Allergies:   Patient has no known allergies.    Social History:  The patient  reports that  has never smoked. he has never used smokeless tobacco. He reports that he does not drink alcohol or use drugs.   Family History:  The patient's Family history  is unknown by patient.    ROS:  Please see the history of present illness.         All other systems are reviewed and negative.    Physical Exam: Blood pressure 140/60, pulse 66, height 5\' 7"  (1.702 m), weight 178 lb 6.4 oz (80.9 kg), SpO2 96 %.  GEN:  Well nourished, well developed in no acute distress HEENT: Normal NECK: No JVD; No carotid bruits LYMPHATICS: No lymphadenopathy CARDIAC: RRR , no murmurs, rubs, gallops RESPIRATORY:  Clear to auscultation without rales, wheezing or rhonchi  ABDOMEN: Soft, non-tender, non-distended MUSCULOSKELETAL:  No edema; No deformity  SKIN: Warm and dry NEUROLOGIC:  Alert and oriented x 3   EKG:  EKG is ordered today. The ekg ordered Feb 14, 2017:  Atrial fib with rate of 74.   NS ST abn.    Recent Labs: 02/14/2017: TSH 1.080 05/23/2017: ALT 15 06/23/2017: BUN 14; Creatinine, Ser 0.89; Hemoglobin 14.6; Platelets 190; Potassium 4.7; Sodium 142    Lipid Panel No results found for: CHOL, TRIG, HDL, CHOLHDL, VLDL, LDLCALC, LDLDIRECT    Wt Readings from Last 3 Encounters:  09/03/17 178 lb 6.4 oz (80.9 kg)  07/14/17 174 lb (78.9 kg)  06/04/17 173 lb (78.5 kg)      Other studies Reviewed: Additional studies/ records that were reviewed today include:  . Review of the above records demonstrates:    ASSESSMENT AND PLAN:  1. Atrial fib:    Has PAF,  Seems to be maintaining NSR currently  On Eliquis    2.  HTN:    BP is better  He is feeling much better on his  Current meds  3.  Fatigue :  Seems to be a little better.      4.  Coronary artery disease:   Patient has a history of a tight first diagonal stenosis. He was cathed approximate 10 years ago by Dr. Ilda Foil. His EKG shows an old inferior wall myocardial infarction.  His Myoview study showed no perfusion abnormalities. His left ventricle systolic function is normal with an EF of 58%.    Current medicines are reviewed at length with the patient today.  The patient does not have concerns regarding medicines.  The following changes have been made:  no change  Labs/ tests ordered today include:  No orders of the defined types were placed in this encounter.   Disposition:   FU with me in 6 months   Mertie Moores, MD  09/03/2017 11:02 AM    [facility] [place], [place], [place]  [address] Phone: [phone]; Fax: [phone]

## 2017-10-17 ENCOUNTER — Encounter: Payer: Self-pay | Admitting: Podiatry

## 2017-10-17 ENCOUNTER — Ambulatory Visit (INDEPENDENT_AMBULATORY_CARE_PROVIDER_SITE_OTHER): Payer: Medicare Other | Admitting: Podiatry

## 2017-10-17 DIAGNOSIS — B351 Tinea unguium: Secondary | ICD-10-CM

## 2017-10-17 DIAGNOSIS — M79676 Pain in unspecified toe(s): Secondary | ICD-10-CM

## 2017-10-17 DIAGNOSIS — D689 Coagulation defect, unspecified: Secondary | ICD-10-CM

## 2017-10-17 NOTE — Progress Notes (Signed)
Patient ID: Jonathan [name], male   DOB: 11/30/1930, 84 y.o.   MRN: [id number] Complaint:  Visit Type: Patient returns to my office for continued preventative foot care services. Complaint: Patient states" my nails have grown long and thick and become painful to walk and wear shoes". The patient presents for preventative foot care services. No changes to ROS.  Patient on eliquiss.  Podiatric Exam: Vascular: dorsalis pedis and posterior tibial pulses are palpable bilateral. Capillary return is immediate. Temperature gradient is WNL. Skin turgor WNL  Sensorium: Normal Semmes Weinstein monofilament test. Normal tactile sensation bilaterally. Nail Exam: Pt has thick disfigured discolored nails with subungual debris noted bilateral entire nail hallux through fifth toenails Ulcer Exam: There is no evidence of ulcer or pre-ulcerative changes or infection. Orthopedic Exam: Muscle tone and strength are WNL. No limitations in general ROM. No crepitus or effusions noted. Foot type and digits show no abnormalities. Bony prominences are unremarkable. Skin: No Porokeratosis. No infection or ulcers  Diagnosis:  Onychomycosis, , Pain in right toe, pain in left toes  Treatment & Plan Procedures and Treatment: Consent by patient was obtained for treatment procedures. The patient understood the discussion of treatment and procedures well. All questions were answered thoroughly reviewed. Debridement of mycotic and hypertrophic toenails, 1 through 5 bilateral and clearing of subungual debris. No ulceration, no infection noted. Signed ABN for 2019. Return Visit-Office Procedure: Patient instructed to return to the office for a follow up visit 3 months for continued evaluation and treatment.   Gevin Perea DPM 

## 2017-11-06 DIAGNOSIS — E119 Type 2 diabetes mellitus without complications: Secondary | ICD-10-CM | POA: Diagnosis not present

## 2017-11-06 DIAGNOSIS — M109 Gout, unspecified: Secondary | ICD-10-CM | POA: Diagnosis not present

## 2017-11-06 DIAGNOSIS — R2689 Other abnormalities of gait and mobility: Secondary | ICD-10-CM | POA: Diagnosis not present

## 2017-11-06 DIAGNOSIS — K222 Esophageal obstruction: Secondary | ICD-10-CM | POA: Diagnosis not present

## 2017-11-06 DIAGNOSIS — I10 Essential (primary) hypertension: Secondary | ICD-10-CM | POA: Diagnosis not present

## 2017-11-06 DIAGNOSIS — Z0000 Encounter for general adult medical examination without abnormal findings: Secondary | ICD-10-CM | POA: Diagnosis not present

## 2017-11-06 DIAGNOSIS — Z23 Encounter for immunization: Secondary | ICD-10-CM | POA: Diagnosis not present

## 2017-11-06 DIAGNOSIS — Z79899 Other long term (current) drug therapy: Secondary | ICD-10-CM | POA: Diagnosis not present

## 2017-11-06 DIAGNOSIS — I2510 Atherosclerotic heart disease of native coronary artery without angina pectoris: Secondary | ICD-10-CM | POA: Diagnosis not present

## 2017-11-06 DIAGNOSIS — R413 Other amnesia: Secondary | ICD-10-CM | POA: Diagnosis not present

## 2017-11-06 DIAGNOSIS — Z1389 Encounter for screening for other disorder: Secondary | ICD-10-CM | POA: Diagnosis not present

## 2017-11-06 DIAGNOSIS — R001 Bradycardia, unspecified: Secondary | ICD-10-CM | POA: Diagnosis not present

## 2017-11-06 DIAGNOSIS — R0902 Hypoxemia: Secondary | ICD-10-CM | POA: Diagnosis not present

## 2017-11-06 DIAGNOSIS — I4891 Unspecified atrial fibrillation: Secondary | ICD-10-CM | POA: Diagnosis not present

## 2017-12-08 DIAGNOSIS — G4733 Obstructive sleep apnea (adult) (pediatric): Secondary | ICD-10-CM | POA: Diagnosis not present

## 2017-12-18 DIAGNOSIS — G4733 Obstructive sleep apnea (adult) (pediatric): Secondary | ICD-10-CM | POA: Diagnosis not present

## 2017-12-24 DIAGNOSIS — D044 Carcinoma in situ of skin of scalp and neck: Secondary | ICD-10-CM | POA: Diagnosis not present

## 2017-12-24 DIAGNOSIS — I2510 Atherosclerotic heart disease of native coronary artery without angina pectoris: Secondary | ICD-10-CM | POA: Diagnosis not present

## 2017-12-24 DIAGNOSIS — Z85828 Personal history of other malignant neoplasm of skin: Secondary | ICD-10-CM | POA: Diagnosis not present

## 2017-12-24 DIAGNOSIS — I25119 Atherosclerotic heart disease of native coronary artery with unspecified angina pectoris: Secondary | ICD-10-CM | POA: Diagnosis not present

## 2017-12-24 DIAGNOSIS — L723 Sebaceous cyst: Secondary | ICD-10-CM | POA: Diagnosis not present

## 2017-12-24 DIAGNOSIS — I10 Essential (primary) hypertension: Secondary | ICD-10-CM | POA: Diagnosis not present

## 2017-12-24 DIAGNOSIS — L400 Psoriasis vulgaris: Secondary | ICD-10-CM | POA: Diagnosis not present

## 2017-12-24 DIAGNOSIS — C44311 Basal cell carcinoma of skin of nose: Secondary | ICD-10-CM | POA: Diagnosis not present

## 2017-12-24 DIAGNOSIS — E119 Type 2 diabetes mellitus without complications: Secondary | ICD-10-CM | POA: Diagnosis not present

## 2017-12-24 DIAGNOSIS — L570 Actinic keratosis: Secondary | ICD-10-CM | POA: Diagnosis not present

## 2017-12-24 DIAGNOSIS — D1801 Hemangioma of skin and subcutaneous tissue: Secondary | ICD-10-CM | POA: Diagnosis not present

## 2017-12-24 DIAGNOSIS — D485 Neoplasm of uncertain behavior of skin: Secondary | ICD-10-CM | POA: Diagnosis not present

## 2017-12-24 DIAGNOSIS — E785 Hyperlipidemia, unspecified: Secondary | ICD-10-CM | POA: Diagnosis not present

## 2017-12-24 DIAGNOSIS — L910 Hypertrophic scar: Secondary | ICD-10-CM | POA: Diagnosis not present

## 2017-12-24 DIAGNOSIS — L821 Other seborrheic keratosis: Secondary | ICD-10-CM | POA: Diagnosis not present

## 2017-12-24 DIAGNOSIS — I4891 Unspecified atrial fibrillation: Secondary | ICD-10-CM | POA: Diagnosis not present

## 2017-12-24 DIAGNOSIS — I209 Angina pectoris, unspecified: Secondary | ICD-10-CM | POA: Diagnosis not present

## 2017-12-24 DIAGNOSIS — N400 Enlarged prostate without lower urinary tract symptoms: Secondary | ICD-10-CM | POA: Diagnosis not present

## 2017-12-24 DIAGNOSIS — C44319 Basal cell carcinoma of skin of other parts of face: Secondary | ICD-10-CM | POA: Diagnosis not present

## 2017-12-24 DIAGNOSIS — I252 Old myocardial infarction: Secondary | ICD-10-CM | POA: Diagnosis not present

## 2018-01-12 DIAGNOSIS — R0902 Hypoxemia: Secondary | ICD-10-CM | POA: Diagnosis not present

## 2018-01-12 DIAGNOSIS — E785 Hyperlipidemia, unspecified: Secondary | ICD-10-CM | POA: Diagnosis not present

## 2018-01-12 DIAGNOSIS — H903 Sensorineural hearing loss, bilateral: Secondary | ICD-10-CM | POA: Diagnosis not present

## 2018-01-16 ENCOUNTER — Encounter: Payer: Self-pay | Admitting: Podiatry

## 2018-01-16 ENCOUNTER — Ambulatory Visit (INDEPENDENT_AMBULATORY_CARE_PROVIDER_SITE_OTHER): Payer: Medicare Other | Admitting: Podiatry

## 2018-01-16 DIAGNOSIS — B351 Tinea unguium: Secondary | ICD-10-CM

## 2018-01-16 DIAGNOSIS — D689 Coagulation defect, unspecified: Secondary | ICD-10-CM

## 2018-01-16 DIAGNOSIS — M79676 Pain in unspecified toe(s): Secondary | ICD-10-CM

## 2018-01-16 NOTE — Progress Notes (Signed)
Patient ID: Jonathan [name], male   DOB: 02/28/1931, 84 y.o.   MRN: [id number] Complaint:  Visit Type: Patient returns to my office for continued preventative foot care services. Complaint: Patient states" my nails have grown long and thick and become painful to walk and wear shoes". The patient presents for preventative foot care services. No changes to ROS.  Patient on eliquiss.  Podiatric Exam: Vascular: dorsalis pedis and posterior tibial pulses are palpable bilateral. Capillary return is immediate. Temperature gradient is WNL. Skin turgor WNL  Sensorium: Normal Semmes Weinstein monofilament test. Normal tactile sensation bilaterally. Nail Exam: Pt has thick disfigured discolored nails with subungual debris noted bilateral entire nail hallux through fifth toenails Ulcer Exam: There is no evidence of ulcer or pre-ulcerative changes or infection. Orthopedic Exam: Muscle tone and strength are WNL. No limitations in general ROM. No crepitus or effusions noted. Foot type and digits show no abnormalities. Bony prominences are unremarkable. Skin: No Porokeratosis. No infection or ulcers  Diagnosis:  Onychomycosis, , Pain in right toe, pain in left toes  Treatment & Plan Procedures and Treatment: Consent by patient was obtained for treatment procedures. The patient understood the discussion of treatment and procedures well. All questions were answered thoroughly reviewed. Debridement of mycotic and hypertrophic toenails, 1 through 5 bilateral and clearing of subungual debris. No ulceration, no infection noted. Signed ABN for 2019. Return Visit-Office Procedure: Patient instructed to return to the office for a follow up visit 3 months for continued evaluation and treatment.   Nyx Keady DPM 

## 2018-01-20 DIAGNOSIS — C44311 Basal cell carcinoma of skin of nose: Secondary | ICD-10-CM | POA: Diagnosis not present

## 2018-01-20 DIAGNOSIS — Z85828 Personal history of other malignant neoplasm of skin: Secondary | ICD-10-CM | POA: Diagnosis not present

## 2018-03-11 ENCOUNTER — Emergency Department (HOSPITAL_BASED_OUTPATIENT_CLINIC_OR_DEPARTMENT_OTHER): Payer: Medicare Other

## 2018-03-11 ENCOUNTER — Other Ambulatory Visit: Payer: Self-pay

## 2018-03-11 ENCOUNTER — Inpatient Hospital Stay (HOSPITAL_COMMUNITY): Payer: Medicare Other

## 2018-03-11 ENCOUNTER — Inpatient Hospital Stay (HOSPITAL_BASED_OUTPATIENT_CLINIC_OR_DEPARTMENT_OTHER)
Admission: EM | Admit: 2018-03-11 | Discharge: 2018-03-17 | DRG: 388 | Disposition: A | Discharge status: Home / Self Care | Payer: Medicare Other | Attending: Internal Medicine | Admitting: Internal Medicine

## 2018-03-11 ENCOUNTER — Encounter (HOSPITAL_BASED_OUTPATIENT_CLINIC_OR_DEPARTMENT_OTHER): Payer: Self-pay | Admitting: Emergency Medicine

## 2018-03-11 DIAGNOSIS — I313 Pericardial effusion (noninflammatory): Secondary | ICD-10-CM | POA: Diagnosis present

## 2018-03-11 DIAGNOSIS — Z823 Family history of stroke: Secondary | ICD-10-CM

## 2018-03-11 DIAGNOSIS — Z7901 Long term (current) use of anticoagulants: Secondary | ICD-10-CM

## 2018-03-11 DIAGNOSIS — I2510 Atherosclerotic heart disease of native coronary artery without angina pectoris: Secondary | ICD-10-CM | POA: Diagnosis present

## 2018-03-11 DIAGNOSIS — R1012 Left upper quadrant pain: Secondary | ICD-10-CM | POA: Diagnosis not present

## 2018-03-11 DIAGNOSIS — K222 Esophageal obstruction: Secondary | ICD-10-CM | POA: Diagnosis not present

## 2018-03-11 DIAGNOSIS — K5790 Diverticulosis of intestine, part unspecified, without perforation or abscess without bleeding: Secondary | ICD-10-CM | POA: Diagnosis present

## 2018-03-11 DIAGNOSIS — M1990 Unspecified osteoarthritis, unspecified site: Secondary | ICD-10-CM | POA: Diagnosis present

## 2018-03-11 DIAGNOSIS — E8770 Fluid overload, unspecified: Secondary | ICD-10-CM

## 2018-03-11 DIAGNOSIS — E1136 Type 2 diabetes mellitus with diabetic cataract: Secondary | ICD-10-CM | POA: Diagnosis present

## 2018-03-11 DIAGNOSIS — I700 Atherosclerosis of aorta: Secondary | ICD-10-CM | POA: Diagnosis present

## 2018-03-11 DIAGNOSIS — Z947 Corneal transplant status: Secondary | ICD-10-CM

## 2018-03-11 DIAGNOSIS — I480 Paroxysmal atrial fibrillation: Secondary | ICD-10-CM | POA: Diagnosis present

## 2018-03-11 DIAGNOSIS — Z4682 Encounter for fitting and adjustment of non-vascular catheter: Secondary | ICD-10-CM | POA: Diagnosis not present

## 2018-03-11 DIAGNOSIS — I252 Old myocardial infarction: Secondary | ICD-10-CM | POA: Diagnosis not present

## 2018-03-11 DIAGNOSIS — K8020 Calculus of gallbladder without cholecystitis without obstruction: Secondary | ICD-10-CM | POA: Diagnosis present

## 2018-03-11 DIAGNOSIS — J690 Pneumonitis due to inhalation of food and vomit: Secondary | ICD-10-CM | POA: Diagnosis not present

## 2018-03-11 DIAGNOSIS — Z96643 Presence of artificial hip joint, bilateral: Secondary | ICD-10-CM | POA: Diagnosis present

## 2018-03-11 DIAGNOSIS — D494 Neoplasm of unspecified behavior of bladder: Secondary | ICD-10-CM | POA: Diagnosis present

## 2018-03-11 DIAGNOSIS — N400 Enlarged prostate without lower urinary tract symptoms: Secondary | ICD-10-CM | POA: Diagnosis present

## 2018-03-11 DIAGNOSIS — I481 Persistent atrial fibrillation: Secondary | ICD-10-CM | POA: Diagnosis not present

## 2018-03-11 DIAGNOSIS — K56600 Partial intestinal obstruction, unspecified as to cause: Secondary | ICD-10-CM | POA: Diagnosis not present

## 2018-03-11 DIAGNOSIS — Z800 Family history of malignant neoplasm of digestive organs: Secondary | ICD-10-CM | POA: Diagnosis not present

## 2018-03-11 DIAGNOSIS — I10 Essential (primary) hypertension: Secondary | ICD-10-CM | POA: Diagnosis not present

## 2018-03-11 DIAGNOSIS — N3289 Other specified disorders of bladder: Secondary | ICD-10-CM | POA: Diagnosis present

## 2018-03-11 DIAGNOSIS — K449 Diaphragmatic hernia without obstruction or gangrene: Secondary | ICD-10-CM | POA: Diagnosis present

## 2018-03-11 DIAGNOSIS — K56609 Unspecified intestinal obstruction, unspecified as to partial versus complete obstruction: Secondary | ICD-10-CM | POA: Diagnosis not present

## 2018-03-11 DIAGNOSIS — R188 Other ascites: Secondary | ICD-10-CM | POA: Diagnosis present

## 2018-03-11 DIAGNOSIS — K219 Gastro-esophageal reflux disease without esophagitis: Secondary | ICD-10-CM | POA: Diagnosis present

## 2018-03-11 DIAGNOSIS — R1032 Left lower quadrant pain: Secondary | ICD-10-CM | POA: Diagnosis not present

## 2018-03-11 DIAGNOSIS — E876 Hypokalemia: Secondary | ICD-10-CM

## 2018-03-11 DIAGNOSIS — E118 Type 2 diabetes mellitus with unspecified complications: Secondary | ICD-10-CM | POA: Diagnosis not present

## 2018-03-11 DIAGNOSIS — K5669 Other partial intestinal obstruction: Secondary | ICD-10-CM | POA: Diagnosis not present

## 2018-03-11 DIAGNOSIS — K56699 Other intestinal obstruction unspecified as to partial versus complete obstruction: Secondary | ICD-10-CM | POA: Diagnosis not present

## 2018-03-11 DIAGNOSIS — Z0189 Encounter for other specified special examinations: Secondary | ICD-10-CM

## 2018-03-11 DIAGNOSIS — E119 Type 2 diabetes mellitus without complications: Secondary | ICD-10-CM

## 2018-03-11 DIAGNOSIS — R0609 Other forms of dyspnea: Secondary | ICD-10-CM

## 2018-03-11 LAB — COMPREHENSIVE METABOLIC PANEL
ALBUMIN: 4.2 g/dL (ref 3.5–5.0)
ALT: 14 U/L — ABNORMAL LOW (ref 17–63)
ANION GAP: 10 (ref 5–15)
AST: 23 U/L (ref 15–41)
Alkaline Phosphatase: 81 U/L (ref 38–126)
BUN: 13 mg/dL (ref 6–20)
CHLORIDE: 107 mmol/L (ref 101–111)
CO2: 23 mmol/L (ref 22–32)
Calcium: 9 mg/dL (ref 8.9–10.3)
Creatinine, Ser: 0.75 mg/dL (ref 0.61–1.24)
GFR calc Af Amer: >60 mL/min (ref >60)
GFR calc non Af Amer: >60 mL/min (ref >60)
GLUCOSE: 157 mg/dL — AB (ref 65–99)
POTASSIUM: 4 mmol/L (ref 3.5–5.1)
SODIUM: 140 mmol/L (ref 135–145)
Total Bilirubin: 0.8 mg/dL (ref 0.3–1.2)
Total Protein: 7.4 g/dL (ref 6.5–8.1)

## 2018-03-11 LAB — URINALYSIS, MICROSCOPIC (REFLEX)

## 2018-03-11 LAB — CBC
HCT: 47.8 % (ref 39.0–52.0)
HEMOGLOBIN: 16.6 g/dL (ref 13.0–17.0)
MCH: 30.2 pg (ref 26.0–34.0)
MCHC: 34.7 g/dL (ref 30.0–36.0)
MCV: 87.1 fL (ref 78.0–100.0)
Platelets: 192 10*3/uL (ref 150–400)
RBC: 5.49 MIL/uL (ref 4.22–5.81)
RDW: 13.8 % (ref 11.5–15.5)
WBC: 9.9 10*3/uL (ref 4.0–10.5)

## 2018-03-11 LAB — URINALYSIS, ROUTINE W REFLEX MICROSCOPIC
BILIRUBIN URINE: NEGATIVE
Glucose, UA: NEGATIVE mg/dL
Ketones, ur: NEGATIVE mg/dL
Leukocytes, UA: NEGATIVE
NITRITE: NEGATIVE
Protein, ur: NEGATIVE mg/dL
SPECIFIC GRAVITY, URINE: 1.02 (ref 1.005–1.030)
pH: 7 (ref 5.0–8.0)

## 2018-03-11 LAB — LIPASE, BLOOD: Lipase: 23 U/L (ref 11–51)

## 2018-03-11 LAB — MAGNESIUM: MAGNESIUM: 2.1 mg/dL (ref 1.7–2.4)

## 2018-03-11 LAB — APTT: APTT: 33 s (ref 24–36)

## 2018-03-11 LAB — HEPARIN LEVEL (UNFRACTIONATED)

## 2018-03-11 MED ORDER — ALUM & MAG HYDROXIDE-SIMETH 200-200-20 MG/5ML PO SUSP
30.0000 mL | Freq: Four times a day (QID) | ORAL | Status: DC | PRN
  Administered 2018-03-13: 30 mL via ORAL
  Filled 2018-03-11: qty 30

## 2018-03-11 MED ORDER — GUAIFENESIN-DM 100-10 MG/5ML PO SYRP: 10.0000 mL | ORAL_SOLUTION | ORAL | Status: DC | PRN

## 2018-03-11 MED ORDER — ACETAMINOPHEN 650 MG RE SUPP: 650.0000 mg | Freq: Four times a day (QID) | RECTAL | Status: DC | PRN

## 2018-03-11 MED ORDER — ONDANSETRON HCL 4 MG PO TABS: 4.0000 mg | ORAL_TABLET | Freq: Four times a day (QID) | ORAL | Status: DC | PRN

## 2018-03-11 MED ORDER — HEPARIN (PORCINE) IN NACL 100-0.45 UNIT/ML-% IJ SOLN
1200.0000 [IU]/h | INTRAMUSCULAR | Status: DC
Start: 2018-03-11 — End: 2018-03-14
  Administered 2018-03-11: 1200 [IU]/h via INTRAVENOUS
  Filled 2018-03-11 (×4): qty 250

## 2018-03-11 MED ORDER — MORPHINE SULFATE (PF) 2 MG/ML IV SOLN
2.0000 mg | INTRAVENOUS | Status: DC | PRN
Start: 2018-03-11 — End: 2018-03-17
  Administered 2018-03-11 – 2018-03-12 (×3): 2 mg via INTRAVENOUS
  Filled 2018-03-11 (×3): qty 1

## 2018-03-11 MED ORDER — SODIUM CHLORIDE 0.9% FLUSH: 3.0000 mL | Freq: Two times a day (BID) | INTRAVENOUS | Status: DC

## 2018-03-11 MED ORDER — NITROGLYCERIN 0.4 MG SL SUBL: 0.4000 mg | SUBLINGUAL_TABLET | SUBLINGUAL | Status: DC | PRN

## 2018-03-11 MED ORDER — MENTHOL 3 MG MT LOZG
1.0000 | LOZENGE | OROMUCOSAL | Status: DC | PRN
  Filled 2018-03-11: qty 9

## 2018-03-11 MED ORDER — METHOCARBAMOL 1000 MG/10ML IJ SOLN
1000.0000 mg | Freq: Four times a day (QID) | INTRAVENOUS | Status: DC | PRN
  Filled 2018-03-11: qty 10

## 2018-03-11 MED ORDER — MAGIC MOUTHWASH
15.0000 mL | Freq: Four times a day (QID) | ORAL | Status: DC | PRN
  Filled 2018-03-11: qty 15

## 2018-03-11 MED ORDER — ONDANSETRON HCL 4 MG/2ML IJ SOLN
4.0000 mg | Freq: Four times a day (QID) | INTRAMUSCULAR | Status: DC | PRN
  Filled 2018-03-11: qty 2

## 2018-03-11 MED ORDER — LACTATED RINGERS IV BOLUS
1000.0000 mL | Freq: Once | INTRAVENOUS | Status: AC
  Administered 2018-03-11: 1000 mL via INTRAVENOUS

## 2018-03-11 MED ORDER — FENTANYL CITRATE (PF) 100 MCG/2ML IJ SOLN
50.0000 ug | Freq: Once | INTRAMUSCULAR | Status: AC
  Administered 2018-03-11: 50 ug via INTRAVENOUS
  Filled 2018-03-11: qty 2

## 2018-03-11 MED ORDER — ENALAPRILAT 1.25 MG/ML IV SOLN
0.6250 mg | Freq: Four times a day (QID) | INTRAVENOUS | Status: DC
  Administered 2018-03-11 – 2018-03-13 (×9): 0.625 mg via INTRAVENOUS
  Filled 2018-03-11 (×11): qty 0.5

## 2018-03-11 MED ORDER — ONDANSETRON HCL 40 MG/20ML IJ SOLN
8.0000 mg | Freq: Four times a day (QID) | INTRAMUSCULAR | Status: DC | PRN
  Filled 2018-03-11: qty 4

## 2018-03-11 MED ORDER — HALOBETASOL PROPIONATE 0.05 % EX OINT: 1.0000 "application " | TOPICAL_OINTMENT | Freq: Two times a day (BID) | CUTANEOUS | Status: DC | PRN

## 2018-03-11 MED ORDER — KETOROLAC TROMETHAMINE 15 MG/ML IJ SOLN
15.0000 mg | Freq: Four times a day (QID) | INTRAMUSCULAR | Status: AC | PRN
  Administered 2018-03-11 – 2018-03-13 (×4): 15 mg via INTRAVENOUS
  Filled 2018-03-11 (×4): qty 1

## 2018-03-11 MED ORDER — LIP MEDEX EX OINT
1.0000 "application " | TOPICAL_OINTMENT | Freq: Two times a day (BID) | CUTANEOUS | Status: DC
  Administered 2018-03-11 – 2018-03-17 (×11): 1 via TOPICAL
  Filled 2018-03-11 (×3): qty 7

## 2018-03-11 MED ORDER — FAMOTIDINE IN NACL 20-0.9 MG/50ML-% IV SOLN
20.0000 mg | Freq: Two times a day (BID) | INTRAVENOUS | Status: DC
  Administered 2018-03-11 – 2018-03-17 (×12): 20 mg via INTRAVENOUS
  Filled 2018-03-11 (×13): qty 50

## 2018-03-11 MED ORDER — ACETAMINOPHEN 325 MG PO TABS: 650.0000 mg | ORAL_TABLET | Freq: Four times a day (QID) | ORAL | Status: DC | PRN

## 2018-03-11 MED ORDER — ONDANSETRON HCL 4 MG/2ML IJ SOLN: 4.0000 mg | Freq: Four times a day (QID) | INTRAMUSCULAR | Status: DC | PRN

## 2018-03-11 MED ORDER — PHENOL 1.4 % MT LIQD
1.0000 | OROMUCOSAL | Status: DC | PRN
  Filled 2018-03-11 (×2): qty 177

## 2018-03-11 MED ORDER — HYDROCORTISONE 1 % EX CREA
1.0000 "application " | TOPICAL_CREAM | Freq: Three times a day (TID) | CUTANEOUS | Status: DC | PRN
  Filled 2018-03-11: qty 28

## 2018-03-11 MED ORDER — PROCHLORPERAZINE EDISYLATE 10 MG/2ML IJ SOLN: 5.0000 mg | INTRAMUSCULAR | Status: DC | PRN

## 2018-03-11 MED ORDER — BISACODYL 10 MG RE SUPP
10.0000 mg | Freq: Every day | RECTAL | Status: DC
  Administered 2018-03-11 – 2018-03-16 (×4): 10 mg via RECTAL
  Filled 2018-03-11 (×5): qty 1

## 2018-03-11 MED ORDER — ONDANSETRON HCL 4 MG/2ML IJ SOLN
4.0000 mg | Freq: Once | INTRAMUSCULAR | Status: AC
  Administered 2018-03-11: 4 mg via INTRAVENOUS
  Filled 2018-03-11: qty 2

## 2018-03-11 MED ORDER — FLUOCINONIDE 0.05 % EX OINT
TOPICAL_OINTMENT | Freq: Two times a day (BID) | CUTANEOUS | Status: DC | PRN
  Filled 2018-03-11: qty 15

## 2018-03-11 MED ORDER — SODIUM CHLORIDE 0.9 % IV SOLN
INTRAVENOUS | Status: DC
  Administered 2018-03-11 – 2018-03-15 (×5): via INTRAVENOUS

## 2018-03-11 MED ORDER — METOCLOPRAMIDE HCL 5 MG/ML IJ SOLN: 10.0000 mg | Freq: Four times a day (QID) | INTRAMUSCULAR | Status: DC | PRN

## 2018-03-11 MED ORDER — FENTANYL CITRATE (PF) 100 MCG/2ML IJ SOLN: 25.0000 ug | INTRAMUSCULAR | Status: DC | PRN

## 2018-03-11 MED ORDER — HYDROCORTISONE 2.5 % RE CREA
1.0000 "application " | TOPICAL_CREAM | Freq: Four times a day (QID) | RECTAL | Status: DC | PRN
  Filled 2018-03-11: qty 28.35

## 2018-03-11 MED ORDER — FINASTERIDE 5 MG PO TABS
5.0000 mg | ORAL_TABLET | Freq: Every morning | ORAL | Status: DC
  Administered 2018-03-13 – 2018-03-17 (×4): 5 mg via ORAL
  Filled 2018-03-11 (×4): qty 1

## 2018-03-11 MED ORDER — DIATRIZOATE MEGLUMINE & SODIUM 66-10 % PO SOLN
90.0000 mL | Freq: Once | ORAL | Status: AC
  Administered 2018-03-11: 90 mL via NASOGASTRIC
  Filled 2018-03-11: qty 90

## 2018-03-11 MED ORDER — LACTATED RINGERS IV BOLUS: 1000.0000 mL | Freq: Three times a day (TID) | INTRAVENOUS | Status: AC | PRN

## 2018-03-11 MED ORDER — SENNA 8.6 MG PO TABS
1.0000 | ORAL_TABLET | Freq: Two times a day (BID) | ORAL | Status: DC
  Administered 2018-03-11 – 2018-03-17 (×6): 8.6 mg via ORAL
  Filled 2018-03-11 (×9): qty 1

## 2018-03-11 MED ORDER — IOPAMIDOL (ISOVUE-300) INJECTION 61%
100.0000 mL | Freq: Once | INTRAVENOUS | Status: AC | PRN
  Administered 2018-03-11: 100 mL via INTRAVENOUS

## 2018-03-11 MED ORDER — DIPHENHYDRAMINE HCL 50 MG/ML IJ SOLN: 12.5000 mg | Freq: Four times a day (QID) | INTRAMUSCULAR | Status: DC | PRN

## 2018-03-11 NOTE — Progress Notes (Signed)
ANTICOAGULATION CONSULT NOTE - Initial Consult  Pharmacy Consult for apixaban to heparin gtt Indication: atrial fibrillation  No Known Allergies  Patient Measurements: Height: 5\' 7"  (170.2 cm) Weight: 175 lb (79.4 kg) IBW/kg (Calculated) : 66.1 Heparin Dosing Weight: 79kg  Vital Signs: Temp: 98.3 F (36.8 C) (05/29 1640) Temp Source: Oral (05/29 1640) BP: 152/74 (05/29 1640) Pulse Rate: 60 (05/29 1640)  Labs: Recent Labs    03/11/18 1159  HGB 16.6  HCT 47.8  PLT 192  CREATININE 0.75    Estimated Creatinine Clearance: 69.4 mL/min (by C-G formula based on SCr of 0.75 mg/dL).   Medical History: Past Medical History:  Diagnosis Date  . Acne rosacea   . Arthritis   . Atrial fibrillation ([place])   . Borderline diabetes   . CAD (coronary artery disease)   . Diabetes mellitus without complication ([place])   . Diverticulosis   . Elevated PSA   . Esophageal stricture   . GERD (gastroesophageal reflux disease)   . History of nuclear stress test    Nuclear stress test 6/18: EF 59, small apical defect (?old MI); no ischemia; Low Risk  . Hypertension   . Myocardial infarction ([place])   . Neck fracture ([place]) 2015  . Shoulder pain     Assessment: 89 YOM presents with abdominal pain secondary to SBO.  Patient on apixaban for h/o afib.  Last dose 5/29 at 0700.  Pharmacy asked to start heparin gtt while off apixaban.    Today, 03/11/2018  CBC WNL  Baseline aPTT = 33 sec, heparin level = >2.20 (elevated as would expect with apixaban use prior to admission)  Goal of Therapy:  Heparin level 0.3-0.7 units/ml aPTT 66-102 seconds Monitor platelets by anticoagulation protocol: Yes   Plan:   Once baseline coags drawn,  Start heparin 1200 units/hr (no bolus) since last apixaban dose >12h ago  Due to recent DOAC, will use aPTT to monitor heparin gtt until heparin level (anti-Xa) correlates with aPTT.   Check aPTT 8h after start of heparin  Daily heparin level and CBC  Doreene Eland, PharmD, BCPS.   Pager: [phone] 03/11/2018 6:55 PM

## 2018-03-11 NOTE — Consult Note (Signed)
Reason for Consult: abd pain Referring Physician: Dr Royetta Asal is an 84 y.o. male.  HPI: Pt with A fib on Eliquis with h/o CAD, DM2.  He has a history of prior inguinal hernia surgeries x3 in 1960's.  He presented to the ED at the [facility] with sudden onset left lower quadrant cramping constant abdominal pain.  Patient stated that at 7 AM this morning he had left lower quadrant abdominal pain cramping in nature nonradiating and constant with some associated nausea x1.  Patient denies any emesis.  His last BM was yesterday.      Past Medical History:  Diagnosis Date  . Acne rosacea   . Arthritis   . Atrial fibrillation ([place])   . Borderline diabetes   . CAD (coronary artery disease)   . Diabetes mellitus without complication ([place])   . Diverticulosis   . Elevated PSA   . Esophageal stricture   . GERD (gastroesophageal reflux disease)   . History of nuclear stress test    Nuclear stress test 6/18: EF 59, small apical defect (?old MI); no ischemia; Low Risk  . Hypertension   . Myocardial infarction ([place])   . Neck fracture ([place]) 2015  . Shoulder pain     Past Surgical History:  Procedure Laterality Date  . BALLOON DILATION  04/02/2012   Procedure: BALLOON DILATION;  Surgeon: Arta Silence, MD;  Location: [facility] ENDOSCOPY;  Service: Endoscopy;  Laterality: N/A;  . BALLOON DILATION N/A 06/16/2013   Procedure: BALLOON DILATION;  Surgeon: Arta Silence, MD;  Location: [facility] ENDOS[facility];  Service: Endoscopy;  Laterality: N/A;  . BALLOON DILATION N/A 11/16/2014   Procedure: BALLOON DILATION;  Surgeon: Arta Silence, MD;  Location: [facility]OS[facility];  Service: Endoscopy;  Laterality: N/A;  . BALLOON DILATION N/A 02/13/2017   Procedure: BALLOON DILATION;  Surgeon: Arta Silence, MD;  Location: [facility] ENDOSCOPY;  Service: Endoscopy;  Laterality: N/A;  . CORNEAL TRANSPLANT    . ESOPHAGOGASTRODUODENOSCOPY  04/02/2012   Procedure: ESOPHAGOGASTRODUODENOSCOPY (EGD);  Surgeon: Arta Silence, MD;  Location: [facility];  Service: Endoscopy;  Laterality: N/A;  . ESOPHAGOGASTRODUODENOSCOPY N/A 05/28/2013   Procedure: ESOPHAGOGASTRODUODENOSCOPY (EGD);  Surgeon: Cleotis Nipper, MD;  Location: [facility];  Service: Endoscopy;  Laterality: N/A;  . ESOPHAGOGASTRODUODENOSCOPY N/A 02/20/2015   Procedure: ESOPHAGOGASTRODUODENOSCOPY (EGD);  Surgeon: Arta Silence, MD;  Location: [facility];  Service: Endoscopy;  Laterality: N/A;  . ESOPHAGOGASTRODUODENOSCOPY (EGD) WITH PROPOFOL N/A 06/16/2013   Procedure: ESOPHAGOGASTRODUODENOSCOPY (EGD) WITH PROPOFOL;  Surgeon: Arta Silence, MD;  Location: [facility];  Service: Endoscopy;  Laterality: N/A;  . ESOPHAGOGASTRODUODENOSCOPY (EGD) WITH PROPOFOL N/A 11/16/2014   Procedure: ESOPHAGOGASTRODUODENOSCOPY (EGD) WITH PROPOFOL;  Surgeon: Arta Silence, MD;  Location: [facility];  Service: Endoscopy;  Laterality: N/A;  . ESOPHAGOGASTRODUODENOSCOPY (EGD) WITH PROPOFOL N/A 02/13/2017   Procedure: ESOPHAGOGASTRODUODENOSCOPY (EGD) WITH PROPOFOL;  Surgeon: Arta Silence, MD;  Location: [place];  Service: Endoscopy;  Laterality: N/A;  . EYE SURGERY     cataract sx  . HERNIA REPAIR     x 2  . TOTAL HIP ARTHROPLASTY     bilateral    Family History  Family history unknown: Yes    Social History:  reports that he has never smoked. He has never used smokeless tobacco. He reports that he does not drink alcohol or use drugs.  Allergies: No Known Allergies  Medications: I have reviewed the patient's current medications.  Results for orders placed or performed during the hospital encounter of 03/11/18 (from  the past 48 hour(s))  Lipase, blood     Status: None   Collection Time: 03/11/18 11:59 AM  Result Value Ref Range   Lipase 23 11 - 51 U/L    Comment: Performed at [facility], [address] [place], [place] [address]  Comprehensive metabolic panel     Status: Abnormal   Collection Time: 03/11/18 11:59 AM  Result Value  Ref Range   Sodium 140 135 - 145 mmol/L   Potassium 4.0 3.5 - 5.1 mmol/L   Chloride 107 101 - 111 mmol/L   CO2 23 22 - 32 mmol/L   Glucose, Bld 157 (H) 65 - 99 mg/dL   BUN 13 6 - 20 mg/dL   Creatinine, Ser 0.75 0.61 - 1.24 mg/dL   Calcium 9.0 8.9 - 10.3 mg/dL   Total Protein 7.4 6.5 - 8.1 g/dL   Albumin 4.2 3.5 - 5.0 g/dL   AST 23 15 - 41 U/L   ALT 14 (L) 17 - 63 U/L   Alkaline Phosphatase 81 38 - 126 U/L   Total Bilirubin 0.8 0.3 - 1.2 mg/dL   GFR calc non Af Amer >60 >60 mL/min   GFR calc Af Amer >60 >60 mL/min    Comment: (NOTE) The eGFR has been calculated using the CKD EPI equation. This calculation has not been validated in all clinical situations. eGFR's persistently <60 mL/min signify possible Chronic Kidney Disease.    Anion gap 10 5 - 15    Comment: Performed at [facility], [address] [place], [place] [address]  CBC     Status: None   Collection Time: 03/11/18 11:59 AM  Result Value Ref Range   WBC 9.9 4.0 - 10.5 K/uL   RBC 5.49 4.22 - 5.81 MIL/uL   Hemoglobin 16.6 13.0 - 17.0 g/dL   HCT 47.8 39.0 - 52.0 %   MCV 87.1 78.0 - 100.0 fL   MCH 30.2 26.0 - 34.0 pg   MCHC 34.7 30.0 - 36.0 g/dL   RDW 13.8 11.5 - 15.5 %   Platelets 192 150 - 400 K/uL    Comment: Performed at [facility], [address] [place], [place] [address]  Urinalysis, Routine w reflex microscopic     Status: Abnormal   Collection Time: 03/11/18 12:00 PM  Result Value Ref Range   Color, Urine YELLOW YELLOW   APPearance CLEAR CLEAR   Specific Gravity, Urine 1.020 1.005 - 1.030   pH 7.0 5.0 - 8.0   Glucose, UA NEGATIVE NEGATIVE mg/dL   Hgb urine dipstick TRACE (A) NEGATIVE   Bilirubin Urine NEGATIVE NEGATIVE   Ketones, ur NEGATIVE NEGATIVE mg/dL   Protein, ur NEGATIVE NEGATIVE mg/dL   Nitrite NEGATIVE NEGATIVE   Leukocytes, UA NEGATIVE NEGATIVE    Comment: Performed at [facility], [address] [place], [place] [address]  Urinalysis, Microscopic  (reflex)     Status: Abnormal   Collection Time: 03/11/18 12:00 PM  Result Value Ref Range   RBC / HPF 0-5 0 - 5 RBC/hpf   WBC, UA 0-5 0 - 5 WBC/hpf   Bacteria, UA RARE (A) NONE SEEN   Squamous Epithelial / LPF 0-5 0 - 5    Comment: Performed at [facility], [address] [place], [place] [address]  Magnesium     Status: None   Collection Time: 03/11/18  6:32 PM  Result Value Ref Range   Magnesium  2.1 1.7 - 2.4 mg/dL    Comment: Performed at [facility], [address] [place], [place] [address]  Heparin level (unfractionated)     Status: Abnormal   Collection Time: 03/11/18  7:22 PM  Result Value Ref Range   Heparin Unfractionated >2.20 (H) 0.30 - 0.70 IU/mL    Comment: RESULTS CONFIRMED BY MANUAL DILUTION Performed at [facility], [address] [place], [place] [address]   APTT     Status: None   Collection Time: 03/11/18  7:22 PM  Result Value Ref Range   aPTT 33 24 - 36 seconds    Comment: Performed at [facility], [address] [place], [place] [address]    Ct Abdomen Pelvis W Contrast  Result Date: 03/11/2018 CLINICAL DATA:  Abdominal pain beginning today. EXAM: CT ABDOMEN AND PELVIS WITH CONTRAST TECHNIQUE: Multidetector CT imaging of the abdomen and pelvis was performed using the standard protocol following bolus administration of intravenous contrast. CONTRAST:  100mL ISOVUE-300 IOPAMIDOL (ISOVUE-300) INJECTION 61% COMPARISON:  10/21/2008 FINDINGS: Lower chest: Small hiatal hernia. Small to moderate pericardial effusion. Hepatobiliary: The liver appears somewhat small. Question the possibility of cirrhosis. Multiple gallstones dependent in the gallbladder without CT evidence of cholecystitis or obstruction. Pancreas: Normal Spleen: Normal Adrenals/Urinary Tract: Adrenal glands are normal. Kidneys are normal. No cyst, mass, stone or hydronephrosis. No definite bladder abnormality. Cannot rule out a  posterior bladder wall mass or clot. This could simply be impression from the very large prostate. Stomach/Bowel: Dilated fluid and air-filled loops of small intestine in the central abdomen with collapsed small intestine distal to that consistent with small bowel obstruction. Specific etiology not clearly discernible. Sigmoid diverticulosis. No evidence of diverticulitis. Vascular/Lymphatic: Massive external iliac adenopathy on the right, extending up 2 4.5 cm in diameter. This is not well seen because of artifact from the hip replacements. Cannot completely rule out that this could represent a cystic abnormality emanating from the right hip or even could represent a pseudo aneurysm. No abdominal retroperitoneal adenopathy. Aortic atherosclerosis. Reproductive: Enlarged prostate as noted above. Other: Small amount of ascites. Musculoskeletal: Ordinary lumbar degenerative changes. IMPRESSION: Acute small bowel obstruction, probably in the ileum. Specific etiology not discernible. Small amount of associated ascites. Small hiatal hernia.  Small to moderate pericardial effusion. Small liver.  Question cirrhosis.  Chololithiasis. Aortic atherosclerosis without aneurysm. Markedly enlarged prostate. Not well seen because of artifact from the hips. 4.5 cm abnormality in the right external iliac region that could represent a nodal mass. The differential diagnosis does include a cystic abnormality emanating from the right hip joint and even pseudo aneurysm. In the setting of the enlarged prostate, the possibility of metastatic prostate cancer does exist, though there are no enlarged nodes seen cephalad to that. Electronically Signed   By: Mark  Shogry M.D.   On: 03/11/2018 13:45    Review of Systems  Constitutional: Negative for chills and fever.  Eyes: Negative for blurred vision.  Respiratory: Negative for cough and shortness of breath.   Cardiovascular: Negative for chest pain and palpitations.  Gastrointestinal:  Positive for abdominal pain and nausea. Negative for blood in stool, constipation, diarrhea, melena and vomiting.  Genitourinary: Negative for dysuria.  Skin: Negative for rash.  Neurological: Negative for dizziness and headaches.   Blood pressure (!) 152/74, pulse 60, temperature 98.3 F (36.8 C), temperature source Oral, resp. rate 18, height 5' 7" (1.702 m), weight 79.4 kg (175 lb), SpO2 95 %. Physical Exam  Constitutional:   He is oriented to person, place, and time. He appears well-developed and well-nourished. No distress.  HENT:  Head: Normocephalic and atraumatic.  Eyes: Pupils are equal, round, and reactive to light. Conjunctivae and EOM are normal.  Neck: Normal range of motion. Neck supple.  Cardiovascular: Normal rate.  Respiratory: Effort normal. No respiratory distress.  GI: Soft. There is tenderness (periumbilical). There is no rebound and no guarding.  Musculoskeletal: Normal range of motion.  Neurological: He is alert and oriented to person, place, and time.    Assessment/Plan: 84 y.o. M with acute onset abd pain and CT showing SBO.  Hold Eliquis.  Cont NG decompression overnight.  Will start SBO protocol in AM.    ,  C. 03/11/2018, 8:30 PM     

## 2018-03-11 NOTE — Progress Notes (Signed)
84 year old male with history of hypertension, atrial fibrillation, diabetes, presented to [facility] with complaints of abdominal pain that started this morning.  Patient has some associated nausea without vomiting.  Last bowel movement was on 03/10/2018.  CT abdomen pelvis that showed acute SBO.  EDP discussed with general surgery, Dr. Johney Maine, who recommended medical management at this time.  Patient is also been on Eliquis which has been held at this time.  Accepted to MedSurg bed at [facility].  Lindsy Cerullo D.O. [facility] Pager [phone]  If 7PM-7AM, please contact night-coverage [email or web address] Password [facility] 03/11/2018, 2:37 PM

## 2018-03-11 NOTE — H&P (Signed)
History and Physical    Jonathan [name] IEP:[id number] DOB: 01/10/1931 DOA: 03/11/2018  PCP: Josetta Huddle, MD  /gastroenterologist: Dr. Paulita Fujita Cardiologist: Dr.Nahser  Patient coming from: Home.  I have personally briefly reviewed patient's old medical records in [place]  Chief Complaint: Abdominal pain  HPI: Jonathan [name] is a 84 y.o. male with medical history significant of atrial fibrillation on chronic anticoagulation with Eliquis, coronary artery disease, type 2 diabetes mellitus, history of esophageal strictures status post multiple dilatations last dilatation 02/2017, hypertension, history of prior inguinal hernia surgeries x3 in 1961 in 1962 who presents to the ED at the [facility] with sudden onset left lower quadrant cramping constant abdominal pain.  Patient stated last bowel movement was the night prior to admission.  Patient stated woke up at 6 AM on the day of admission was feeling fine however at 7 AM had left lower quadrant abdominal pain cramping in nature nonradiating and constant with some associated nausea x1.  Patient denies any emesis.  Patient denies any syncopal episode, no dizziness, no cough, no hematemesis, no hematochezia, no melena, no fever, no chills, no chest pain, no shortness of breath, no dysuria, no diarrhea.  No focal neurological deficits.  It was noted.  Patient and wife that when patient presented to the ED was noted to be significantly diaphoretic.  ED Course: Patient seen in the ED, urinalysis which was done was unremarkable.  CBC done was unremarkable.  Comprehensive metabolic profile had a glucose of 157 otherwise was unremarkable.  CT abdomen and pelvis that showed an acute small bowel obstruction probably in the ileum, small amount of associated ascites.  Small hiatal hernia.  Small to moderate pericardial effusion.  Small liver question cirrhosis.  Cholelithiasis.  Aortic atherosclerosis without aneurysm.  Markedly enlarged  prostate.  4.5 cm abnormality in the right external iliac region that could represent an nodal mass.  Differential diagnosis includes cystic abnormality emanating from the right hip joint and even pseudoaneurysm.  Per ED physician case was discussed with CT findings over the telephone with general surgery, Dr. Johney Maine who recommended medical management and admission to the hospitalist group.  [facility] were called to admit the patient for further evaluation and management.  Review of Systems: As per HPI otherwise 10 point review of systems negative.   Past Medical History:  Diagnosis Date  . Acne rosacea   . Arthritis   . Atrial fibrillation ([place])   . Borderline diabetes   . CAD (coronary artery disease)   . Diabetes mellitus without complication ([place])   . Diverticulosis   . Elevated PSA   . Esophageal stricture   . GERD (gastroesophageal reflux disease)   . History of nuclear stress test    Nuclear stress test 6/18: EF 59, small apical defect (?old MI); no ischemia; Low Risk  . Hypertension   . Myocardial infarction ([place])   . Neck fracture ([place]) 2015  . Shoulder pain     Past Surgical History:  Procedure Laterality Date  . BALLOON DILATION  04/02/2012   Procedure: BALLOON DILATION;  Surgeon: Arta Silence, MD;  Location: [facility] ENDOSCOPY;  Service: Endoscopy;  Laterality: N/A;  . BALLOON DILATION N/A 06/16/2013   Procedure: BALLOON DILATION;  Surgeon: Arta Silence, MD;  Location: [facility] ENDOSCOPY;  Service: Endoscopy;  Laterality: N/A;  . BALLOON DILATION N/A 11/16/2014   Procedure: BALLOON DILATION;  Surgeon: Arta Silence, MD;  Location: [facility] ENDOSCOPY;  Service: Endoscopy;  Laterality: N/A;  . BALLOON DILATION N/A  02/13/2017   Procedure: BALLOON DILATION;  Surgeon: Arta Silence, MD;  Location[facility] END[facility]COPY;  Service: Endoscopy;  Laterality: N/A;  . CORNEAL TRANSPLANT    . ESOPHAGOGASTRODUODENOSCOPY  04/02/2012   Procedure: ESOPHAGOGASTRODUODENOSCOPY (EGD);  Surgeon: Arta Silence,  MD;  Location: [facility];  Service: Endoscopy;  Laterality: N/A;  . ESOPHAGOGASTRODUODENOSCOPY N/A 05/28/2013   Procedure: ESOPHAGOGASTRODUODENOSCOPY (EGD);  Surgeon: Cleotis Nipper, MD;  Location: [facility];  Service: Endoscopy;  Laterality: N/A;  . ESOPHAGOGASTRODUODENOSCOPY N/A 02/20/2015   Procedure: ESOPHAGOGASTRODUODENOSCOPY (EGD);  Surgeon: Arta Silence, MD;  Location: [facility];  Service: Endoscopy;  Laterality: N/A;  . ESOPHAGOGASTRODUODENOSCOPY (EGD) WITH PROPOFOL N/A 06/16/2013   Procedure: ESOPHAGOGASTRODUODENOSCOPY (EGD) WITH PROPOFOL;  Surgeon: Arta Silence, MD;  Location: [facility] ENDOSCOPY;  Service: Endoscopy;  Laterality: N/A;  . ESOPHAGOGASTRODUODENOSCOPY (EGD) WITH PROPOFOL N/A 11/16/2014   Procedure: ESOPHAGOGASTRODUODENOSCOPY (EGD) WITH PROPOFOL;  Surgeon: Arta Silence, MD;  Location: [facility] ENDOSCOPY;  Service: Endoscopy;  Laterality: N/A;  . ESOPHAGOGASTRODUODENOSCOPY (EGD) WITH PROPOFOL N/A 02/13/2017   Procedure: ESOPHAGOGASTRODUODENOSCOPY (EGD) WITH PROPOFOL;  Surgeon: Arta Silence, MD;  Location: [place];  Service: Endoscopy;  Laterality: N/A;  . EYE SURGERY     cataract sx  . HERNIA REPAIR     x 2  . TOTAL HIP ARTHROPLASTY     bilateral     reports that he has never smoked. He has never used smokeless tobacco. He reports that he does not drink alcohol or use drugs.  No Known Allergies  Family History  Family history unknown: Yes   Father deceased age 63 from pancreatic cancer.  Mother deceased age 35 had an acute CVA/old age.  Prior to Admission medications   Medication Sig Start Date End Date Taking? Authorizing Provider  acetaminophen (TYLENOL) 500 MG tablet Take 500 mg by mouth every 6 (six) hours as needed.   Yes [provider]  amLODipine (NORVASC) 5 MG tablet Take 5 mg by mouth daily. 02/05/18  Yes [provider]  apixaban (ELIQUIS) 5 MG TABS tablet Take 1 tablet (5 mg total) by mouth 2 (two) times daily. 04/21/17  Yes Daune Perch, NP  atorvastatin (LIPITOR) 10 MG tablet Take 10 mg tablet by mouth Monday, Wednesday and friday   Yes [provider]  finasteride (PROSCAR) 5 MG tablet Take 5 mg by mouth every morning.    Yes [provider]  halobetasol (ULTRAVATE) 0.05 % ointment Apply 1 application topically 2 (two) times daily as needed (itching or swelling). APPLY TWICE DAILY TO HANDS 04/28/15  Yes [provider]  isosorbide mononitrate (IMDUR) 30 MG 24 hr tablet Take 30 mg by mouth daily with lunch.  01/21/18  Yes [provider]  losartan (COZAAR) 50 MG tablet Take 50 mg by mouth at bedtime.  01/21/18  Yes [provider]  omeprazole (PRILOSEC) 20 MG capsule Take 20 mg by mouth daily.   Yes [provider]  NITROSTAT 0.4 MG SL tablet Take 0.4 mg by mouth every 5 (five) minutes as needed for chest pain. AS NEEDED FOR CHEST PAIN 06/15/15   [provider]    Physical Exam: Vitals:   03/11/18 1500 03/11/18 1504 03/11/18 1549 03/11/18 1640  BP: (!) 158/75 (!) 158/75 (!) 178/80 (!) 152/74  Pulse: (!) 59 (!) 58 60 60  Resp: 16 18 18    Temp:    98.3 F (36.8 C)  TempSrc:    Oral  SpO2: 97% 94% 94% 95%  Weight:      Height:  Constitutional: NAD, calm, comfortable Vitals:   03/11/18 1500 03/11/18 1504 03/11/18 1549 03/11/18 1640  BP: (!) 158/75 (!) 158/75 (!) 178/80 (!) 152/74  Pulse: (!) 59 (!) 58 60 60  Resp: 16 18 18    Temp:    98.3 F (36.8 C)  TempSrc:    Oral  SpO2: 97% 94% 94% 95%  Weight:      Height:       Eyes: PERRLA, lids and conjunctivae normal ENMT: Mucous membranes are dry. Posterior pharynx clear of any exudate or lesions.Normal dentition.  Neck: normal, supple, no masses, no thyromegaly Respiratory: clear to auscultation bilaterally, no wheezing, no crackles. Normal respiratory effort. No accessory muscle use.  Cardiovascular: Regular rate and rhythm, no murmurs / rubs / gallops. No extremity edema. 2+ pedal pulses. No  carotid bruits.  Abdomen: Soft, nondistended, hypoactive bowel sounds, some tenderness to palpation in the left lower quadrant, no rebound, no guarding.   Musculoskeletal: no clubbing / cyanosis. No joint deformity upper and lower extremities. Good ROM, no contractures. Normal muscle tone.  Skin: no rashes, lesions, ulcers. No induration Neurologic: CN 2-12 grossly intact. Sensation intact, DTR normal. Strength 5/5 in all 4.  Psychiatric: Normal judgment and insight. Alert and oriented x 3. Normal mood.   Labs on Admission: I have personally reviewed following labs and imaging studies  CBC: Recent Labs  Lab 03/11/18 1159  WBC 9.9  HGB 16.6  HCT 47.8  MCV 87.1  PLT 400   Basic Metabolic Panel: Recent Labs  Lab 03/11/18 1159  NA 140  K 4.0  CL 107  CO2 23  GLUCOSE 157*  BUN 13  CREATININE 0.75  CALCIUM 9.0   GFR: Estimated Creatinine Clearance: 69.4 mL/min (by C-G formula based on SCr of 0.75 mg/dL). Liver Function Tests: Recent Labs  Lab 03/11/18 1159  AST 23  ALT 14*  ALKPHOS 81  BILITOT 0.8  PROT 7.4  ALBUMIN 4.2   Recent Labs  Lab 03/11/18 1159  LIPASE 23   No results for input(s): AMMONIA in the last 168 hours. Coagulation Profile: No results for input(s): INR, PROTIME in the last 168 hours. Cardiac Enzymes: No results for input(s): CKTOTAL, CKMB, CKMBINDEX, TROPONINI in the last 168 hours. BNP (last 3 results) No results for input(s): PROBNP in the last 8760 hours. HbA1C: No results for input(s): HGBA1C in the last 72 hours. CBG: No results for input(s): GLUCAP in the last 168 hours. Lipid Profile: No results for input(s): CHOL, HDL, LDLCALC, TRIG, CHOLHDL, LDLDIRECT in the last 72 hours. Thyroid Function Tests: No results for input(s): TSH, T4TOTAL, FREET4, T3FREE, THYROIDAB in the last 72 hours. Anemia Panel: No results for input(s): VITAMINB12, FOLATE, FERRITIN, TIBC, IRON, RETICCTPCT in the last 72 hours. Urine analysis:    Component Value  Date/Time   COLORURINE YELLOW 03/11/2018 1200   APPEARANCEUR CLEAR 03/11/2018 1200   LABSPEC 1.020 03/11/2018 1200   PHURINE 7.0 03/11/2018 1200   GLUCOSEU NEGATIVE 03/11/2018 1200   HGBUR TRACE (A) 03/11/2018 1200   BILIRUBINUR NEGATIVE 03/11/2018 1200   KETONESUR NEGATIVE 03/11/2018 1200   PROTEINUR NEGATIVE 03/11/2018 1200   NITRITE NEGATIVE 03/11/2018 1200   LEUKOCYTESUR NEGATIVE 03/11/2018 1200    Radiological Exams on Admission: Ct Abdomen Pelvis W Contrast  Result Date: 03/11/2018 CLINICAL DATA:  Abdominal pain beginning today. EXAM: CT ABDOMEN AND PELVIS WITH CONTRAST TECHNIQUE: Multidetector CT imaging of the abdomen and pelvis was performed using the standard protocol following bolus administration of intravenous contrast. CONTRAST:  156mL  ISOVUE-300 IOPAMIDOL (ISOVUE-300) INJECTION 61% COMPARISON:  10/21/2008 FINDINGS: Lower chest: Small hiatal hernia. Small to moderate pericardial effusion. Hepatobiliary: The liver appears somewhat small. Question the possibility of cirrhosis. Multiple gallstones dependent in the gallbladder without CT evidence of cholecystitis or obstruction. Pancreas: Normal Spleen: Normal Adrenals/Urinary Tract: Adrenal glands are normal. Kidneys are normal. No cyst, mass, stone or hydronephrosis. No definite bladder abnormality. Cannot rule out a posterior bladder wall mass or clot. This could simply be impression from the very large prostate. Stomach/Bowel: Dilated fluid and air-filled loops of small intestine in the central abdomen with collapsed small intestine distal to that consistent with small bowel obstruction. Specific etiology not clearly discernible. Sigmoid diverticulosis. No evidence of diverticulitis. Vascular/Lymphatic: Massive external iliac adenopathy on the right, extending up 2 4.5 cm in diameter. This is not well seen because of artifact from the hip replacements. Cannot completely rule out that this could represent a cystic abnormality emanating  from the right hip or even could represent a pseudo aneurysm. No abdominal retroperitoneal adenopathy. Aortic atherosclerosis. Reproductive: Enlarged prostate as noted above. Other: Small amount of ascites. Musculoskeletal: Ordinary lumbar degenerative changes. IMPRESSION: Acute small bowel obstruction, probably in the ileum. Specific etiology not discernible. Small amount of associated ascites. Small hiatal hernia.  Small to moderate pericardial effusion. Small liver.  Question cirrhosis.  Chololithiasis. Aortic atherosclerosis without aneurysm. Markedly enlarged prostate. Not well seen because of artifact from the hips. 4.5 cm abnormality in the right external iliac region that could represent a nodal mass. The differential diagnosis does include a cystic abnormality emanating from the right hip joint and even pseudo aneurysm. In the setting of the enlarged prostate, the possibility of metastatic prostate cancer does exist, though there are no enlarged nodes seen cephalad to that. Electronically Signed   By: Nelson Chimes M.D.   On: 03/11/2018 13:45    EKG: Not done  Assessment/Plan Principal Problem:   Small bowel obstruction (HCC) Active Problems:   CAD (coronary artery disease)   Persistent atrial fibrillation (HCC)   Essential hypertension   Diabetes mellitus (HCC)   GERD (gastroesophageal reflux disease)   Esophageal stricture   #1 acute small bowel obstruction Per CT abdomen and pelvis.  Patient presented with abdominal pain and nausea as well as diaphoresis which was noted in the ED per wife.  Patient with prior history of inguinal hernias x3  1961/62.  Concern for small bowel obstruction secondary to adhesions.  Will admit patient to MedSurg.  Place on bowel rest, IV fluids, antiemetics, pain management.  Mobilize.  Replete electrolytes.  Repeat abdominal films in the morning.  Consult with general surgery for further evaluation and management.  2.  Hypertension Stable.  Patient  currently n.p.o. and as such we will hold patient's oral antihypertensive medications.  Place on IV enalapril.  Will follow.  3.  Atrial fibrillation CHA2DSVASC SCORE 5 Currently rate controlled.  Hold Eliquis secondary to problem #1 and place on IV heparin.  Follow.  4.  Coronary artery disease Stable.  Hold oral cardiac medications for now.  Place on IV enalapril.  Follow.  5.  Gastroesophageal reflux/history of esophageal stricture IV Pepcid.   DVT prophylaxis: Heparin Code Status: Full Family Communication: Updated patient and wife at bedside. Disposition Plan: Home once small bowel obstruction has resolved with clinical improvement. Consults called: General surgery: Dr. Marcello Moores Admission status: Admit to inpatient.   Irine Seal MD [facility] Pager [phone]  If 7PM-7AM, please contact night-coverage [email or web address] Password [facility]  03/11/2018,  6:12 PM

## 2018-03-11 NOTE — Plan of Care (Signed)
Plan of care discussed with patient and family 

## 2018-03-11 NOTE — Progress Notes (Signed)
Dr Ree Kida notified of patient arrival; she will call flow manager. Donne Hazel, RN

## 2018-03-11 NOTE — ED Triage Notes (Signed)
LLQ abd pain with nausea x 5 hours.

## 2018-03-11 NOTE — ED Notes (Signed)
Report to [place], Therapist, sports at [employer].

## 2018-03-11 NOTE — ED Provider Notes (Signed)
[place] EMERGENCY DEPARTMENT Provider Note   CSN: [id number] Arrival date & time: 03/11/18  1145     History   Chief Complaint Chief Complaint  Patient presents with  . Abdominal Pain    HPI Jonathan [name] is a 84 y.o. male.  84 year old male with past medical history including CAD, atrial fibrillation, type 2 diabetes mellitus, esophageal stricture, hypertension, diverticulosis who presents with abdominal pain.  Approximately 5 hours prior to arrival, the patient began having abdominal pain that he describes as central to left-sided, constant, and 10/10 in intensity.  He had associated nausea earlier but denies any vomiting.  He had a normal bowel movement last night, no blood.  No fevers, urinary symptoms, or recent illness.  He has not eaten anything today.  No medications prior to arrival.  The history is provided by the patient.  Abdominal Pain      Past Medical History:  Diagnosis Date  . Acne rosacea   . Arthritis   . Atrial fibrillation ([place])   . Borderline diabetes   . CAD (coronary artery disease)   . Diabetes mellitus without complication ([place])   . Diverticulosis   . Elevated PSA   . Esophageal stricture   . GERD (gastroesophageal reflux disease)   . History of nuclear stress test    Nuclear stress test 6/18: EF 59, small apical defect (?old MI); no ischemia; Low Risk  . Hypertension   . Myocardial infarction ([place])   . Neck fracture ([place]) 2015  . Shoulder pain     Patient Active Problem List   Diagnosis Date Noted  . Symptomatic sinus bradycardia   . Persistent atrial fibrillation ([place]) 02/14/2017  . Essential hypertension 02/14/2017  . CAD (coronary artery disease) 07/10/2015  . Fatigue 07/10/2015    Past Surgical History:  Procedure Laterality Date  . BALLOON DILATION  04/02/2012   Procedure: BALLOON DILATION;  Surgeon: Arta Silence, MD;  Location: [facility] ENDOSCOPY;  Service: Endoscopy;  Laterality: N/A;  . BALLOON DILATION N/A  06/16/2013   Procedure: BALLOON DILATION;  Surgeon: Arta Silence, MD;  Location: [facility] ENDOSCOPY;  Service: Endoscopy;  Laterality: N/A;  . BALLOON DILATION N/A 11/16/2014   Procedure: BALLOON DILATION;  Surgeon: Arta Silence, MD;  Location: [facility] ENDOSCOPY;  Service: Endoscopy;  Laterality: N/A;  . BALLOON DILATION N/A 02/13/2017   Procedure: BALLOON DILATION;  Surgeon: Arta Silence, MD;  Location[facility] ENDOSCOPY;  Service: Endoscopy;  Laterality: N/A;  . CORNEAL TRANSPLANT    . ESOPHAGOGASTRODUODENOSCOPY  04/02/2012   Procedure: ESOPHAGOGASTRODUODENOSCOPY (EGD);  Surgeon: Arta Silence, MD;  Location: [facility];  Service: Endoscopy;  Laterality: N/A;  . ESOPHAGOGASTRODUODENOSCOPY N/A 05/28/2013   Procedure: ESOPHAGOGASTRODUODENOSCOPY (EGD);  Surgeon: Cleotis Nipper, MD;  Location: [facility];  Service: Endoscopy;  Laterality: N/A;  . ESOPHAGOGASTRODUODENOSCOPY N/A 02/20/2015   Procedure: ESOPHAGOGASTRODUODENOSCOPY (EGD);  Surgeon: Arta Silence, MD;  Location: [facility];  Service: Endoscopy;  Laterality: N/A;  . ESOPHAGOGASTRODUODENOSCOPY (EGD) WITH PROPOFOL N/A 06/16/2013   Procedure: ESOPHAGOGASTRODUODENOSCOPY (EGD) WITH PROPOFOL;  Surgeon: Arta Silence, MD;  Location: [facility] ENDOSCOPY;  Service: Endoscopy;  Laterality: N/A;  . ESOPHAGOGASTRODUODENOSCOPY (EGD) WITH PROPOFOL N/A 11/16/2014   Procedure: ESOPHAGOGASTRODUODENOSCOPY (EGD) WITH PROPOFOL;  Surgeon: Arta Silence, MD;  Location: [facility]OS[facility];  Service: Endoscopy;  Laterality: N/A;  . ESOPHAGOGASTRODUODENOSCOPY (EGD) WITH PROPOFOL N/A 02/13/2017   Procedure: ESOPHAGOGASTRODUODENOSCOPY (EGD) WITH PROPOFOL;  Surgeon: Arta Silence, MD;  Location: [facility];  Service: Endoscopy;  Laterality: N/A;  . EYE SURGERY     cataract  sx  . HERNIA REPAIR     x 2  . TOTAL HIP ARTHROPLASTY     bilateral        Home Medications    Prior to Admission medications   Medication Sig Start Date End Date Taking? Authorizing Provider  amLODipine  (NORVASC) 5 MG tablet Take 1 tablet (5 mg total) by mouth daily. 05/28/17 09/03/17  Nahser, Wonda Cheng, MD  apixaban (ELIQUIS) 5 MG TABS tablet Take 1 tablet (5 mg total) by mouth 2 (two) times daily. 04/21/17   Daune Perch, NP  atorvastatin (LIPITOR) 10 MG tablet Take 10 mg tablet by mouth Monday, Wednesday and friday    [provider]  finasteride (PROSCAR) 5 MG tablet Take 5 mg by mouth every morning.     [provider]  halobetasol (ULTRAVATE) 0.05 % ointment Apply 1 application topically 2 (two) times daily as needed (itching or swelling). APPLY TWICE DAILY TO HANDS 04/28/15   [provider]  isosorbide mononitrate (IMDUR) 30 MG 24 hr tablet Take 1 tablet (30 mg total) by mouth daily. 06/04/17 09/03/17  Nahser, Wonda Cheng, MD  losartan (COZAAR) 50 MG tablet Take 1 tablet (50 mg total) by mouth daily. 06/04/17 09/03/17  Nahser, Wonda Cheng, MD  NITROSTAT 0.4 MG SL tablet Take 0.4 mg by mouth every 5 (five) minutes as needed for chest pain. AS NEEDED FOR CHEST PAIN 06/15/15   [provider]  omeprazole (PRILOSEC) 20 MG capsule Take 20 mg by mouth daily.    [provider]    Family History Family History  Family history unknown: Yes    Social History Social History   Tobacco Use  . Smoking status: Never Smoker  . Smokeless tobacco: Never Used  Substance Use Topics  . Alcohol use: No  . Drug use: No     Allergies   Patient has no known allergies.   Review of Systems Review of Systems  Gastrointestinal: Positive for abdominal pain.   All other systems reviewed and are negative except that which was mentioned in HPI   Physical Exam Updated Vital Signs BP (!) 160/80 (BP Location: Right Arm)   Pulse 70   Temp 98.1 F (36.7 C) (Oral)   Resp 18   Ht 5\' 7"  (1.702 m)   Wt 79.4 kg (175 lb)   SpO2 97%   BMI 27.41 kg/m   Physical Exam  Constitutional: He is oriented to person, place, and time. He appears well-developed and  well-nourished. No distress.  HENT:  Head: Normocephalic and atraumatic.  Moist mucous membranes  Eyes: Pupils are equal, round, and reactive to light. Conjunctivae are normal.  Neck: Neck supple.  Cardiovascular: Normal rate, regular rhythm and normal heart sounds.  No murmur heard. Pulmonary/Chest: Effort normal and breath sounds normal.  Abdominal: Soft. Bowel sounds are normal. He exhibits distension (mild). There is tenderness in the left upper quadrant and left lower quadrant. There is no rigidity, no rebound and no guarding.  Musculoskeletal: He exhibits no edema.  Neurological: He is alert and oriented to person, place, and time.  Fluent speech  Skin: Skin is warm and dry.  Psychiatric: He has a normal mood and affect. Judgment normal.  Nursing note and vitals reviewed.    ED Treatments / Results  Labs (all labs ordered are listed, but only abnormal results are displayed) Labs Reviewed  COMPREHENSIVE METABOLIC PANEL - Abnormal; Notable for the following components:      Result Value   Glucose, Bld  157 (*)    ALT 14 (*)    All other components within normal limits  URINALYSIS, ROUTINE W REFLEX MICROSCOPIC - Abnormal; Notable for the following components:   Hgb urine dipstick TRACE (*)    All other components within normal limits  URINALYSIS, MICROSCOPIC (REFLEX) - Abnormal; Notable for the following components:   Bacteria, UA RARE (*)    All other components within normal limits  LIPASE, BLOOD  CBC    EKG None  Radiology Ct Abdomen Pelvis W Contrast  Result Date: 03/11/2018 CLINICAL DATA:  Abdominal pain beginning today. EXAM: CT ABDOMEN AND PELVIS WITH CONTRAST TECHNIQUE: Multidetector CT imaging of the abdomen and pelvis was performed using the standard protocol following bolus administration of intravenous contrast. CONTRAST:  128mL ISOVUE-300 IOPAMIDOL (ISOVUE-300) INJECTION 61% COMPARISON:  10/21/2008 FINDINGS: Lower chest: Small hiatal hernia. Small to  moderate pericardial effusion. Hepatobiliary: The liver appears somewhat small. Question the possibility of cirrhosis. Multiple gallstones dependent in the gallbladder without CT evidence of cholecystitis or obstruction. Pancreas: Normal Spleen: Normal Adrenals/Urinary Tract: Adrenal glands are normal. Kidneys are normal. No cyst, mass, stone or hydronephrosis. No definite bladder abnormality. Cannot rule out a posterior bladder wall mass or clot. This could simply be impression from the very large prostate. Stomach/Bowel: Dilated fluid and air-filled loops of small intestine in the central abdomen with collapsed small intestine distal to that consistent with small bowel obstruction. Specific etiology not clearly discernible. Sigmoid diverticulosis. No evidence of diverticulitis. Vascular/Lymphatic: Massive external iliac adenopathy on the right, extending up 2 4.5 cm in diameter. This is not well seen because of artifact from the hip replacements. Cannot completely rule out that this could represent a cystic abnormality emanating from the right hip or even could represent a pseudo aneurysm. No abdominal retroperitoneal adenopathy. Aortic atherosclerosis. Reproductive: Enlarged prostate as noted above. Other: Small amount of ascites. Musculoskeletal: Ordinary lumbar degenerative changes. IMPRESSION: Acute small bowel obstruction, probably in the ileum. Specific etiology not discernible. Small amount of associated ascites. Small hiatal hernia.  Small to moderate pericardial effusion. Small liver.  Question cirrhosis.  Chololithiasis. Aortic atherosclerosis without aneurysm. Markedly enlarged prostate. Not well seen because of artifact from the hips. 4.5 cm abnormality in the right external iliac region that could represent a nodal mass. The differential diagnosis does include a cystic abnormality emanating from the right hip joint and even pseudo aneurysm. In the setting of the enlarged prostate, the possibility of  metastatic prostate cancer does exist, though there are no enlarged nodes seen cephalad to that. Electronically Signed   By: Nelson Chimes M.D.   On: 03/11/2018 13:45    Procedures Procedures (including critical care time)  Medications Ordered in ED Medications  ondansetron (ZOFRAN) injection 4 mg (has no administration in time range)  fentaNYL (SUBLIMAZE) injection 50 mcg (has no administration in time range)     Initial Impression / Assessment and Plan / ED Course  I have reviewed the triage vital signs and the nursing notes.  Pertinent labs & imaging results that were available during my care of the patient were reviewed by me and considered in my medical decision making (see chart for details).    Patient was nontoxic on exam, mild abdominal distension, left-sided tenderness noted.  Lab work unremarkable showing normal LFTs and lipase, normal WBC count. DDX: Diverticulitis, bowel obstruction, mass Pain CT of abdomen pelvis which shows acute small bowel obstruction likely in the ileum without obvious adhesions.  Incidental finding of enlarged prostate and abnormality  in right external iliac region.  I discussed CT findings over the phone with Dr. Johney Maine, general surgery, who recommended medical management especially given the patient's comorbidities and current Eliquis use.  Discussed admission with [facility] hospitalist, Dr. Ree Kida, who accepted admission. I appreciate her assistance. Pt will be transferred to [facility] for admission.  Final Clinical Impressions(s) / ED Diagnoses   Final diagnoses:  Small bowel obstruction [facility])    ED Discharge Orders    None       Carr Shartzer, Wenda [place], MD 03/11/18 [phone]

## 2018-03-11 NOTE — Progress Notes (Signed)
Pt gave permission for his wife to remain in room while questions were asked on the nursing admission hx. Lucius Conn BSN, RN-BC Admissions RN 03/11/2018 5:10 PM

## 2018-03-12 ENCOUNTER — Inpatient Hospital Stay (HOSPITAL_COMMUNITY): Payer: Medicare Other

## 2018-03-12 LAB — BASIC METABOLIC PANEL
Anion gap: 8 (ref 5–15)
BUN: 17 mg/dL (ref 6–20)
CO2: 25 mmol/L (ref 22–32)
CREATININE: 1.04 mg/dL (ref 0.61–1.24)
Calcium: 8.8 mg/dL — ABNORMAL LOW (ref 8.9–10.3)
Chloride: 109 mmol/L (ref 101–111)
GFR calc Af Amer: >60 mL/min (ref >60)
GLUCOSE: 122 mg/dL — AB (ref 65–99)
Potassium: 4 mmol/L (ref 3.5–5.1)
SODIUM: 142 mmol/L (ref 135–145)

## 2018-03-12 LAB — CBC
HEMATOCRIT: 46.4 % (ref 39.0–52.0)
Hemoglobin: 15.7 g/dL (ref 13.0–17.0)
MCH: 30 pg (ref 26.0–34.0)
MCHC: 33.8 g/dL (ref 30.0–36.0)
MCV: 88.7 fL (ref 78.0–100.0)
PLATELETS: 192 10*3/uL (ref 150–400)
RBC: 5.23 MIL/uL (ref 4.22–5.81)
RDW: 13.6 % (ref 11.5–15.5)
WBC: 12.1 10*3/uL — AB (ref 4.0–10.5)

## 2018-03-12 LAB — GLUCOSE, CAPILLARY: Glucose-Capillary: 123 mg/dL — ABNORMAL HIGH (ref 65–99)

## 2018-03-12 LAB — APTT
aPTT: 80 seconds — ABNORMAL HIGH (ref 24–36)
aPTT: 73 seconds — ABNORMAL HIGH (ref 24–36)

## 2018-03-12 LAB — PROTIME-INR
INR: 1.32
PROTHROMBIN TIME: 16.3 s — AB (ref 11.4–15.2)

## 2018-03-12 LAB — MAGNESIUM: Magnesium: 1.9 mg/dL (ref 1.7–2.4)

## 2018-03-12 NOTE — Progress Notes (Signed)
PT Cancellation Note  Patient Details Name: Jonathan [name] MRN: [id number] DOB: Nov 16, 1930   Cancelled Treatment:    Reason Eval/Treat Not Completed: PT screened, no needs identified, will sign off. Spoke with OT who reported pt has been ambulating halls with family without difficulty. Will sign off. Please reorder if needs change. Thanks.   Weston Anna, MPT Pager: [phone]

## 2018-03-12 NOTE — Progress Notes (Signed)
OT Cancellation Note  Patient Details Name: Jonathan [name] MRN: [id number] DOB: [birth year]   Cancelled Treatment:    Reason Eval/Treat Not Completed: OT screened, no needs identified, will sign off. Spoke to RN, pt and family member. Pt walked around unit 6x's and feels steady. Does not anticipate any difficulty with ADLs. Will sign off.   Atiya Yera 03/12/2018, 2:01 PM  Lesle Chris, OTR/L [phone] 03/12/2018

## 2018-03-12 NOTE — Progress Notes (Signed)
PROGRESS NOTE    Jonathan [name]  IRC:[id number] DOB: Oct 25, 1930 DOA: 03/11/2018 PCP: Josetta Huddle, MD   Brief Narrative:  Patient is a 84 year old gentleman history of A. fib on chronic anticoagulation with Eliquis, coronary artery disease, type 2 diabetes, history of esophageal strictures status post multiple dilations, history of hernia repair x3 in the 1960s presented with nausea abdominal pain and noted on CT abdomen and pelvis to have an acute small bowel obstruction.  General surgery consulted.  NG tube placed.  Small bowel protocol followed through being done.   Assessment & Plan:   Principal Problem:   Small bowel obstruction (H[facility]) Active Problems:   CAD (coronary artery disease)   Persistent atrial fibrillation ([facility])   Essential hypertension   Diabetes mellitus ([facility])   GERD (gastroesophageal reflux disease)   Esophageal stricture   SBO (small bowel obstruction) ([facility])  #1 acute small bowel obstruction Questionable etiology.  Patient noted to have a prior history of hernia repair x3 in the 1960s.  Abdominal film today with filling defect noted in the bladder similar to that seen on CT suspicious for bladder mass, also consistent with small bowel obstruction.  Continue NG tube for decompression.  Bowel rest.  Replete electrolytes.  General surgery following.  IV fluids.  Supportive care.  2.  Abnormal abdominal x-ray/??  Bladder mass. Patient currently with acute small bowel obstruction.  Will discuss with urology to see if any further evaluation is needed at this time.  Follow.  3.  Diabetes mellitus Blood glucose of 123.  Follow.  4.  Hypertension IV enalapril.  5.  Persistent atrial fibrillation CHA2DS2VASC score 5 Currently rate controlled.  Eliquis on hold secondary to problem #1 in case of procedure as needed.  Continue IV heparin.  6.  Gastroesophageal reflux disease Continue IV Pepcid.    DVT prophylaxis: Heparin Code Status: Full Family Communication:  Updated patient and daughter at bedside. Disposition Plan: Likely home once small bowel obstruction is resolved and per general surgery.   Consultants:   General surgery: Dr. Marcello Moores 03/11/2018.  Procedures:   Small bowel follow-through 03/12/2018 pending  CT abdomen and pelvis 03/11/2018  Abdominal film 03/12/2018, 03/11/2018  Antimicrobials:   None   Subjective: Patient sitting up in chair.  Feels a little bit better.  States pain medication helping with abdominal pain however does not see any significant improvement with abdominal pain.  Passed some small flatus.  Small bowel movement yesterday.  No bowel movement today.  NG tube in place.  The patient ambulated in hallway today.  Objective: Vitals:   03/12/18 0028 03/12/18 0500 03/12/18 0529 03/12/18 1452  BP: 134/75  135/63 (!) 119/58  Pulse:   61 62  Resp:   14 16  Temp:   98.7 F (37.1 C) 98.7 F (37.1 C)  TempSrc:   Oral Oral  SpO2:   93% 96%  Weight:  80 kg (176 lb 5.9 oz)    Height:        Intake/Output Summary (Last 24 hours) at 03/12/2018 2014 Last data filed at 03/12/2018 1800 Gross per 24 hour  Intake 2828.4 ml  Output 1500 ml  Net 1328.4 ml   Filed Weights   03/11/18 1152 03/12/18 0500  Weight: 79.4 kg (175 lb) 80 kg (176 lb 5.9 oz)    Examination:  General exam: NGT Respiratory system: Clear to auscultation. Respiratory effort normal. Cardiovascular system: S1 & S2 heard, RRR. No JVD, murmurs, rubs, gallops or clicks. No pedal edema. Gastrointestinal system: Abdomen  is distended, soft, decreased tenderness to palpation, no rebound, no guarding.  Central nervous system: Alert and oriented. No focal neurological deficits. Extremities: Symmetric 5 x 5 power. Skin: No rashes, lesions or ulcers Psychiatry: Judgement and insight appear normal. Mood & affect appropriate.     Data Reviewed: I have personally reviewed following labs and imaging studies  CBC: Recent Labs  Lab 03/11/18 1159  03/12/18 0516  WBC 9.9 12.1*  HGB 16.6 15.7  HCT 47.8 46.4  MCV 87.1 88.7  PLT 192 858   Basic Metabolic Panel: Recent Labs  Lab 03/11/18 1159 03/11/18 1832 03/12/18 0516  NA 140  --  142  K 4.0  --  4.0  CL 107  --  109  CO2 23  --  25  GLUCOSE 157*  --  122*  BUN 13  --  17  CREATININE 0.75  --  1.04  CALCIUM 9.0  --  8.8*  MG  --  2.1 1.9   GFR: Estimated Creatinine Clearance: 53.6 mL/min (by C-G formula based on SCr of 1.04 mg/dL). Liver Function Tests: Recent Labs  Lab 03/11/18 1159  AST 23  ALT 14*  ALKPHOS 81  BILITOT 0.8  PROT 7.4  ALBUMIN 4.2   Recent Labs  Lab 03/11/18 1159  LIPASE 23   No results for input(s): AMMONIA in the last 168 hours. Coagulation Profile: Recent Labs  Lab 03/12/18 0516  INR 1.32   Cardiac Enzymes: No results for input(s): CKTOTAL, CKMB, CKMBINDEX, TROPONINI in the last 168 hours. BNP (last 3 results) No results for input(s): PROBNP in the last 8760 hours. HbA1C: No results for input(s): HGBA1C in the last 72 hours. CBG: Recent Labs  Lab 03/12/18 0742  GLUCAP 123*   Lipid Profile: No results for input(s): CHOL, HDL, LDLCALC, TRIG, CHOLHDL, LDLDIRECT in the last 72 hours. Thyroid Function Tests: No results for input(s): TSH, T4TOTAL, FREET4, T3FREE, THYROIDAB in the last 72 hours. Anemia Panel: No results for input(s): VITAMINB12, FOLATE, FERRITIN, TIBC, IRON, RETICCTPCT in the last 72 hours. Sepsis Labs: No results for input(s): PROCALCITON, LATICACIDVEN in the last 168 hours.  No results found for this or any previous visit (from the past 240 hour(s)).       Radiology Studies: Ct Abdomen Pelvis W Contrast  Result Date: 03/11/2018 CLINICAL DATA:  Abdominal pain beginning today. EXAM: CT ABDOMEN AND PELVIS WITH CONTRAST TECHNIQUE: Multidetector CT imaging of the abdomen and pelvis was performed using the standard protocol following bolus administration of intravenous contrast. CONTRAST:  158mL ISOVUE-300  IOPAMIDOL (ISOVUE-300) INJECTION 61% COMPARISON:  10/21/2008 FINDINGS: Lower chest: Small hiatal hernia. Small to moderate pericardial effusion. Hepatobiliary: The liver appears somewhat small. Question the possibility of cirrhosis. Multiple gallstones dependent in the gallbladder without CT evidence of cholecystitis or obstruction. Pancreas: Normal Spleen: Normal Adrenals/Urinary Tract: Adrenal glands are normal. Kidneys are normal. No cyst, mass, stone or hydronephrosis. No definite bladder abnormality. Cannot rule out a posterior bladder wall mass or clot. This could simply be impression from the very large prostate. Stomach/Bowel: Dilated fluid and air-filled loops of small intestine in the central abdomen with collapsed small intestine distal to that consistent with small bowel obstruction. Specific etiology not clearly discernible. Sigmoid diverticulosis. No evidence of diverticulitis. Vascular/Lymphatic: Massive external iliac adenopathy on the right, extending up 2 4.5 cm in diameter. This is not well seen because of artifact from the hip replacements. Cannot completely rule out that this could represent a cystic abnormality emanating from the right hip  or even could represent a pseudo aneurysm. No abdominal retroperitoneal adenopathy. Aortic atherosclerosis. Reproductive: Enlarged prostate as noted above. Other: Small amount of ascites. Musculoskeletal: Ordinary lumbar degenerative changes. IMPRESSION: Acute small bowel obstruction, probably in the ileum. Specific etiology not discernible. Small amount of associated ascites. Small hiatal hernia.  Small to moderate pericardial effusion. Small liver.  Question cirrhosis.  Chololithiasis. Aortic atherosclerosis without aneurysm. Markedly enlarged prostate. Not well seen because of artifact from the hips. 4.5 cm abnormality in the right external iliac region that could represent a nodal mass. The differential diagnosis does include a cystic abnormality  emanating from the right hip joint and even pseudo aneurysm. In the setting of the enlarged prostate, the possibility of metastatic prostate cancer does exist, though there are no enlarged nodes seen cephalad to that. Electronically Signed   By: Nelson Chimes M.D.   On: 03/11/2018 13:45   Dg Abd Portable 1v-small Bowel Obstruction Protocol-initial, 8 Hr Delay  Result Date: 03/12/2018 CLINICAL DATA:  Followup small bowel protocol EXAM: PORTABLE ABDOMEN - 1 VIEW COMPARISON:  03/11/18 FINDINGS: Scattered large and small bowel gas is noted. Some persistent small bowel dilatation is noted. Nasogastric catheter is seen within the stomach at the superior aspect of the film. No significant contrast is noted on this image. Clinical correlation is recommended. Contrast material is noted within the bladder from the recent CT examination. There remains some inferior indentation likely related to an enlarged prostate although a second filling defect is noted suspicious for underlying bladder mass. Further evaluation is recommended when clinically able. Bilateral hip replacements are noted. Degenerative changes of the lumbar spine are seen. IMPRESSION: Nasogastric catheter within the stomach. No contrast is noted on this exam question whether contrast was administered. Clinical correlation is recommended. Filling defect within the bladder similar to that seen on recent CT suspicious for bladder mass. Further evaluation when clinically able is recommended. Electronically Signed   By: Inez Catalina M.D.   On: 03/12/2018 08:40   Dg Abd Portable 1v-small Bowel Protocol-position Verification  Result Date: 03/11/2018 CLINICAL DATA:  NG tube placement EXAM: PORTABLE ABDOMEN - 1 VIEW COMPARISON:  CT abdomen/pelvis dated 03/11/2018 FINDINGS: Enteric tube looped in the proximal stomach with its tip in the gastric cardia. Mildly dilated loops of small bowel in the right mid abdomen. This is better evaluated on CT. Excretory contrast in  the bladder. Status post bilateral hip arthroplasty. IMPRESSION: Enteric tube looped in the proximal stomach with its tip in the gastric cardia. Electronically Signed   By: Julian Hy M.D.   On: 03/11/2018 21:33        Scheduled Meds: . bisacodyl  10 mg Rectal Daily  . enalaprilat  0.625 mg Intravenous Q6H  . finasteride  5 mg Oral q morning - 10a  . lip balm  1 application Topical BID  . senna  1 tablet Oral BID  . sodium chloride flush  3 mL Intravenous Q12H   Continuous Infusions: . sodium chloride 100 mL/hr at 03/12/18 0454  . famotidine (PEPCID) IV Stopped (03/12/18 1052)  . heparin 1,200 Units/hr (03/11/18 2053)  . lactated ringers    . methocarbamol (ROBAXIN)  IV    . ondansetron (ZOFRAN) IV       LOS: 1 day    Time spent: 35 minutes    Irine Seal, MD [facility] Pager [phone]  If 7PM-7AM, please contact night-coverage [email or web address] Password [email or web address] 03/12/2018, 8:14 PM

## 2018-03-12 NOTE — Progress Notes (Signed)
ANTICOAGULATION CONSULT NOTE - f/u Consult  Pharmacy Consult for apixaban to heparin gtt Indication: atrial fibrillation  No Known Allergies  Patient Measurements: Height: 5\' 7"  (170.2 cm) Weight: 176 lb 5.9 oz (80 kg) IBW/kg (Calculated) : 66.1 Heparin Dosing Weight: 79kg  Vital Signs: Temp: 98.7 F (37.1 C) (05/30 0529) Temp Source: Oral (05/30 0529) BP: 135/63 (05/30 0529) Pulse Rate: 61 (05/30 0529)  Labs: Recent Labs    03/11/18 1159 03/11/18 1922 03/12/18 0516 03/12/18 1256  HGB 16.6  --  15.7  --   HCT 47.8  --  46.4  --   PLT 192  --  192  --   APTT  --  33 80* 73*  LABPROT  --   --  16.3*  --   INR  --   --  1.32  --   HEPARINUNFRC  --  >2.20*  --   --   CREATININE 0.75  --  1.04  --    Estimated Creatinine Clearance: 53.6 mL/min (by C-G formula based on SCr of 1.04 mg/dL).  Medical History: Past Medical History:  Diagnosis Date  . Acne rosacea   . Arthritis   . Atrial fibrillation ([place])   . Borderline diabetes   . CAD (coronary artery disease)   . Diabetes mellitus without complication ([place])   . Diverticulosis   . Elevated PSA   . Esophageal stricture   . GERD (gastroesophageal reflux disease)   . History of nuclear stress test    Nuclear stress test 6/18: EF 59, small apical defect (?old MI); no ischemia; Low Risk  . Hypertension   . Myocardial infarction ([place])   . Neck fracture ([place]) 2015  . Shoulder pain    Assessment: 49 YOM presents with abdominal pain secondary to SBO.  Patient on apixaban for h/o afib.  Last dose 5/29 at 0700.  Pharmacy asked to start heparin gtt while off apixaban.    5/29: CBC WNL          Baseline aPTT = 33 sec, heparin level = >2.20 (elevated as expected with apixaban prior to admission)  Today, 5/30  0516 aPtt = 80 sec at goal, no infusion or bleeding issues per RN.   1300 aPtt = 73 sec, at goal, RN noted NG output ~ "rusty". CBC ~ same, Plt 192 unchanged  Patient on Ketorolac prn, has received 2 doses, gives  good pain relief  Goal of Therapy:  Heparin level 0.3-0.7 units/ml aPTT 66-102 seconds Monitor platelets by anticoagulation protocol: Yes   Plan:   Continue heparin drip at 1200 units/hr  Due to recent DOAC, will use aPTT to monitor heparin gtt until heparin level (anti-Xa) correlates with aPTT.   Daily heparin level and CBC  APTT with am Heparin level 5/31  Minda Ditto PharmD Pager [phone] 03/12/2018, 2:10 PM

## 2018-03-12 NOTE — Progress Notes (Signed)
ANTICOAGULATION CONSULT NOTE - f/u Consult  Pharmacy Consult for apixaban to heparin gtt Indication: atrial fibrillation  No Known Allergies  Patient Measurements: Height: 5\' 7"  (170.2 cm) Weight: 176 lb 5.9 oz (80 kg) IBW/kg (Calculated) : 66.1 Heparin Dosing Weight: 79kg  Vital Signs: Temp: 98.7 F (37.1 C) (05/30 0529) Temp Source: Oral (05/30 0529) BP: 135/63 (05/30 0529) Pulse Rate: 61 (05/30 0529)  Labs: Recent Labs    03/11/18 1159 03/11/18 1922 03/12/18 0516  HGB 16.6  --  15.7  HCT 47.8  --  46.4  PLT 192  --  192  APTT  --  33 80*  LABPROT  --   --  16.3*  INR  --   --  1.32  HEPARINUNFRC  --  >2.20*  --   CREATININE 0.75  --   --     Estimated Creatinine Clearance: 69.7 mL/min (by C-G formula based on SCr of 0.75 mg/dL).   Medical History: Past Medical History:  Diagnosis Date  . Acne rosacea   . Arthritis   . Atrial fibrillation ([place])   . Borderline diabetes   . CAD (coronary artery disease)   . Diabetes mellitus without complication ([place])   . Diverticulosis   . Elevated PSA   . Esophageal stricture   . GERD (gastroesophageal reflux disease)   . History of nuclear stress test    Nuclear stress test 6/18: EF 59, small apical defect (?old MI); no ischemia; Low Risk  . Hypertension   . Myocardial infarction ([place])   . Neck fracture ([place]) 2015  . Shoulder pain     Assessment: 89 YOM presents with abdominal pain secondary to SBO.  Patient on apixaban for h/o afib.  Last dose 5/29 at 0700.  Pharmacy asked to start heparin gtt while off apixaban.     5/29  CBC WNL  Baseline aPTT = 33 sec, heparin level = >2.20 (elevated as would expect with apixaban use prior to admission) Today, 5/30  0516 aPtt = 80 sec at goal, no infusion or bleeding issues per RN.   Goal of Therapy:  Heparin level 0.3-0.7 units/ml aPTT 66-102 seconds Monitor platelets by anticoagulation protocol: Yes   Plan:   Continue heparin drip at 1200 units/hr  Recheck  aPtt at 1 pm today to ensure stays in range.  Due to recent DOAC, will use aPTT to monitor heparin gtt until heparin level (anti-Xa) correlates with aPTT.   Check aPTT 8h after start of heparin  Daily heparin level and CBC  Dorrene German 03/12/2018 5:53 AM

## 2018-03-12 NOTE — Progress Notes (Signed)
Patient ID: Jonathan [name], male   DOB: October 01, 1931, 84 y.o.   MRN: [id number]       Subjective: Pt still complaining of pain in his LUQ that he had to take pain med for recently.  Passed a little flatus and had a small BM.  NGT With 1L of output since placement last night.  Objective: Vital signs in last 24 hours: Temp:  [98.1 F (36.7 C)-98.7 F (37.1 C)] 98.7 F (37.1 C) (05/30 0529) Pulse Rate:  [58-71] 61 (05/30 0529) Resp:  [14-18] 14 (05/30 0529) BP: (134-178)/(63-80) 135/63 (05/30 0529) SpO2:  [93 %-97 %] 93 % (05/30 0529) Weight:  [79.4 kg (175 lb)-80 kg (176 lb 5.9 oz)] 80 kg (176 lb 5.9 oz) (05/30 0500) Last BM Date: 03/12/18  Intake/Output from previous day: 05/29 0701 - 05/30 0700 In: 1344.4 [I.V.:1294.4; IV Piggyback:50] Out: 9937 [Emesis/NG output:1050] Intake/Output this shift: No intake/output data recorded.  PE: Heart: regular Lungs: CTAB Abd: soft, tender some in LUQ, but no guarding or peritoneal signs, some BS, ND  Lab Results:  Recent Labs    03/11/18 1159 03/12/18 0516  WBC 9.9 12.1*  HGB 16.6 15.7  HCT 47.8 46.4  PLT 192 192   BMET Recent Labs    03/11/18 1159 03/12/18 0516  NA 140 142  K 4.0 4.0  CL 107 109  CO2 23 25  GLUCOSE 157* 122*  BUN 13 17  CREATININE 0.75 1.04  CALCIUM 9.0 8.8*   PT/INR Recent Labs    03/12/18 0516  LABPROT 16.3*  INR 1.32   CMP     Component Value Date/Time   NA 142 03/12/2018 0516   NA 142 06/23/2017 1210   K 4.0 03/12/2018 0516   CL 109 03/12/2018 0516   CO2 25 03/12/2018 0516   GLUCOSE 122 (H) 03/12/2018 0516   BUN 17 03/12/2018 0516   BUN 14 06/23/2017 1210   CREATININE 1.04 03/12/2018 0516   CALCIUM 8.8 (L) 03/12/2018 0516   PROT 7.4 03/11/2018 1159   ALBUMIN 4.2 03/11/2018 1159   AST 23 03/11/2018 1159   ALT 14 (L) 03/11/2018 1159   ALKPHOS 81 03/11/2018 1159   BILITOT 0.8 03/11/2018 1159   GFRNONAA >60 03/12/2018 0516   GFRAA >60 03/12/2018 0516   Lipase     Component Value  Date/Time   LIPASE 23 03/11/2018 1159       Studies/Results: Ct Abdomen Pelvis W Contrast  Result Date: 03/11/2018 CLINICAL DATA:  Abdominal pain beginning today. EXAM: CT ABDOMEN AND PELVIS WITH CONTRAST TECHNIQUE: Multidetector CT imaging of the abdomen and pelvis was performed using the standard protocol following bolus administration of intravenous contrast. CONTRAST:  136mL ISOVUE-300 IOPAMIDOL (ISOVUE-300) INJECTION 61% COMPARISON:  10/21/2008 FINDINGS: Lower chest: Small hiatal hernia. Small to moderate pericardial effusion. Hepatobiliary: The liver appears somewhat small. Question the possibility of cirrhosis. Multiple gallstones dependent in the gallbladder without CT evidence of cholecystitis or obstruction. Pancreas: Normal Spleen: Normal Adrenals/Urinary Tract: Adrenal glands are normal. Kidneys are normal. No cyst, mass, stone or hydronephrosis. No definite bladder abnormality. Cannot rule out a posterior bladder wall mass or clot. This could simply be impression from the very large prostate. Stomach/Bowel: Dilated fluid and air-filled loops of small intestine in the central abdomen with collapsed small intestine distal to that consistent with small bowel obstruction. Specific etiology not clearly discernible. Sigmoid diverticulosis. No evidence of diverticulitis. Vascular/Lymphatic: Massive external iliac adenopathy on the right, extending up 2 4.5 cm in diameter. This is not  well seen because of artifact from the hip replacements. Cannot completely rule out that this could represent a cystic abnormality emanating from the right hip or even could represent a pseudo aneurysm. No abdominal retroperitoneal adenopathy. Aortic atherosclerosis. Reproductive: Enlarged prostate as noted above. Other: Small amount of ascites. Musculoskeletal: Ordinary lumbar degenerative changes. IMPRESSION: Acute small bowel obstruction, probably in the ileum. Specific etiology not discernible. Small amount of  associated ascites. Small hiatal hernia.  Small to moderate pericardial effusion. Small liver.  Question cirrhosis.  Chololithiasis. Aortic atherosclerosis without aneurysm. Markedly enlarged prostate. Not well seen because of artifact from the hips. 4.5 cm abnormality in the right external iliac region that could represent a nodal mass. The differential diagnosis does include a cystic abnormality emanating from the right hip joint and even pseudo aneurysm. In the setting of the enlarged prostate, the possibility of metastatic prostate cancer does exist, though there are no enlarged nodes seen cephalad to that. Electronically Signed   By: Nelson Chimes M.D.   On: 03/11/2018 13:45   Dg Abd Portable 1v-small Bowel Obstruction Protocol-initial, 8 Hr Delay  Result Date: 03/12/2018 CLINICAL DATA:  Followup small bowel protocol EXAM: PORTABLE ABDOMEN - 1 VIEW COMPARISON:  03/11/18 FINDINGS: Scattered large and small bowel gas is noted. Some persistent small bowel dilatation is noted. Nasogastric catheter is seen within the stomach at the superior aspect of the film. No significant contrast is noted on this image. Clinical correlation is recommended. Contrast material is noted within the bladder from the recent CT examination. There remains some inferior indentation likely related to an enlarged prostate although a second filling defect is noted suspicious for underlying bladder mass. Further evaluation is recommended when clinically able. Bilateral hip replacements are noted. Degenerative changes of the lumbar spine are seen. IMPRESSION: Nasogastric catheter within the stomach. No contrast is noted on this exam question whether contrast was administered. Clinical correlation is recommended. Filling defect within the bladder similar to that seen on recent CT suspicious for bladder mass. Further evaluation when clinically able is recommended. Electronically Signed   By: Inez Catalina M.D.   On: 03/12/2018 08:40   Dg Abd  Portable 1v-small Bowel Protocol-position Verification  Result Date: 03/11/2018 CLINICAL DATA:  NG tube placement EXAM: PORTABLE ABDOMEN - 1 VIEW COMPARISON:  CT abdomen/pelvis dated 03/11/2018 FINDINGS: Enteric tube looped in the proximal stomach with its tip in the gastric cardia. Mildly dilated loops of small bowel in the right mid abdomen. This is better evaluated on CT. Excretory contrast in the bladder. Status post bilateral hip arthroplasty. IMPRESSION: Enteric tube looped in the proximal stomach with its tip in the gastric cardia. Electronically Signed   By: Julian Hy M.D.   On: 03/11/2018 21:33    Anti-infectives: Anti-infectives (From admission, onward)   None       Assessment/Plan  SBO -films today do not show any evidence of contrast but do show decrease in small bowel dilatation and air in the small and large bowel.  However, he is still having pain that is requiring pain medication. -leave NGT for now, may try to clamp and see how he does.  Will d/w Dr. Johney Maine.  Afib -hold eliquis  FEN - NPO/NGT/IVFs VTE - SCDs ID - none   LOS: 1 day    Henreitta Cea , [facility] 03/12/2018, 10:09 AM Pager: [phone]

## 2018-03-13 LAB — BASIC METABOLIC PANEL
ANION GAP: 3 — AB (ref 5–15)
BUN: 25 mg/dL — AB (ref 6–20)
CALCIUM: 8.7 mg/dL — AB (ref 8.9–10.3)
CO2: 29 mmol/L (ref 22–32)
CREATININE: 0.97 mg/dL (ref 0.61–1.24)
Chloride: 112 mmol/L — ABNORMAL HIGH (ref 101–111)
GFR calc Af Amer: >60 mL/min (ref >60)
GFR calc non Af Amer: >60 mL/min (ref >60)
GLUCOSE: 130 mg/dL — AB (ref 65–99)
Potassium: 3.9 mmol/L (ref 3.5–5.1)
Sodium: 144 mmol/L (ref 135–145)

## 2018-03-13 LAB — CBC
HCT: 45.4 % (ref 39.0–52.0)
HEMOGLOBIN: 15.2 g/dL (ref 13.0–17.0)
MCH: 29.9 pg (ref 26.0–34.0)
MCHC: 33.5 g/dL (ref 30.0–36.0)
MCV: 89.2 fL (ref 78.0–100.0)
PLATELETS: 178 10*3/uL (ref 150–400)
RBC: 5.09 MIL/uL (ref 4.22–5.81)
RDW: 13.7 % (ref 11.5–15.5)
WBC: 6.4 10*3/uL (ref 4.0–10.5)

## 2018-03-13 LAB — APTT: APTT: 78 s — AB (ref 24–36)

## 2018-03-13 LAB — GLUCOSE, CAPILLARY: Glucose-Capillary: 131 mg/dL — ABNORMAL HIGH (ref 65–99)

## 2018-03-13 LAB — MAGNESIUM: Magnesium: 1.9 mg/dL (ref 1.7–2.4)

## 2018-03-13 LAB — HEPARIN LEVEL (UNFRACTIONATED): HEPARIN UNFRACTIONATED: 1.32 [IU]/mL — AB (ref 0.30–0.70)

## 2018-03-13 MED ORDER — SODIUM CHLORIDE 0.9% FLUSH: 3.0000 mL | INTRAVENOUS | Status: DC | PRN

## 2018-03-13 MED ORDER — LACTATED RINGERS IV BOLUS: 1000.0000 mL | Freq: Three times a day (TID) | INTRAVENOUS | Status: AC | PRN

## 2018-03-13 MED ORDER — ENALAPRILAT 1.25 MG/ML IV SOLN
1.2500 mg | Freq: Four times a day (QID) | INTRAVENOUS | Status: DC
  Administered 2018-03-14 (×2): 1.25 mg via INTRAVENOUS
  Filled 2018-03-13 (×2): qty 1

## 2018-03-13 MED ORDER — SODIUM CHLORIDE 0.9% FLUSH
3.0000 mL | Freq: Two times a day (BID) | INTRAVENOUS | Status: DC
  Administered 2018-03-15: 3 mL via INTRAVENOUS

## 2018-03-13 MED ORDER — SODIUM CHLORIDE 0.9 % IV SOLN
250.0000 mL | INTRAVENOUS | Status: DC | PRN
  Administered 2018-03-16: 250 mL via INTRAVENOUS

## 2018-03-13 NOTE — Care Management Important Message (Signed)
Important Message  Patient Details  Name: Jonathan [name] MRN: [id number] Date of Birth: January 16, 1931   Medicare Important Message Given:  Yes    Kerin Salen 03/13/2018, 11:23 [place] Message  Patient Details  Name: Jonathan [name] MRN: [id number] Date of Birth: 21-Aug-1931   Medicare Important Message Given:  Yes    Kerin Salen 03/13/2018, 11:23 AM

## 2018-03-13 NOTE — Consult Note (Signed)
Urology Consult  Referring physician: Dr. Grandville Silos Reason for referral: Bladder mass  Chief Complaint: abdominal pain  History of Present Illness: Mr Norgaard is a 84yo with a hx fo Afib, DMII, GERD, MI admitted with a small bowel obstruction. On CT he was found to have a 4cm bladder mass.He denies any worsening LUTS or gross hematuria. He is followup by Dr. Diona Fanti for BPH. He denies any fevers/chills/sweats. No flank pain. Creatinine 0.75. No hydronephrosis on CT  Past Medical History:  Diagnosis Date  . Acne rosacea   . Arthritis   . Atrial fibrillation ([place])   . Borderline diabetes   . CAD (coronary artery disease)   . Diabetes mellitus without complication ([place])   . Diverticulosis   . Elevated PSA   . Esophageal stricture   . GERD (gastroesophageal reflux disease)   . History of nuclear stress test    Nuclear stress test 6/18: EF 59, small apical defect (?old MI); no ischemia; Low Risk  . Hypertension   . Myocardial infarction ([place])   . Neck fracture ([place]) 2015  . Shoulder pain    Past Surgical History:  Procedure Laterality Date  . BALLOON DILATION  04/02/2012   Procedure: BALLOON DILATION;  Surgeon: Arta Silence, MD;  Location: [facility];  Service: Endoscopy;  Laterality: N/A;  . BALLOON DILATION N/A 06/16/2013   Procedure: BALLOON DILATION;  Surgeon: Arta Silence, MD;  Location: [facility];  Service: Endoscopy;  Laterality: N/A;  . BALLOON DILATION N/A 11/16/2014   Procedure: BALLOON DILATION;  Surgeon: Arta Silence, MD;  Location: [facility];  Service: Endoscopy;  Laterality: N/A;  . BALLOON DILATION N/A 02/13/2017   Procedure: BALLOON DILATION;  Surgeon: Arta Silence, MD;  Location: [facility] ENDOSCOPY;  Service: Endoscopy;  Laterality: N/A;  . CORNEAL TRANSPLANT    . ESOPHAGOGASTRODUODENOSCOPY  04/02/2012   Procedure: ESOPHAGOGASTRODUODENOSCOPY (EGD);  Surgeon: Arta Silence, MD;  Location: [facility];  Service: Endoscopy;  Laterality: N/A;  .  ESOPHAGOGASTRODUODENOSCOPY N/A 05/28/2013   Procedure: ESOPHAGOGASTRODUODENOSCOPY (EGD);  Surgeon: Cleotis Nipper, MD;  Location: [facility];  Service: Endoscopy;  Laterality: N/A;  . ESOPHAGOGASTRODUODENOSCOPY N/A 02/20/2015   Procedure: ESOPHAGOGASTRODUODENOSCOPY (EGD);  Surgeon: Arta Silence, MD;  Location: [facility];  Service: Endoscopy;  Laterality: N/A;  . ESOPHAGOGASTRODUODENOSCOPY (EGD) WITH PROPOFOL N/A 06/16/2013   Procedure: ESOPHAGOGASTRODUODENOSCOPY (EGD) WITH PROPOFOL;  Surgeon: Arta Silence, MD;  [facility];  Service: Endoscopy;  Laterality: N/A;  . ESOPHAGOGASTRODUODENOSCOPY (EGD) WITH PROPOFOL N/A 11/16/2014   Procedure: ESOPHAGOGASTRODUODENOSCOPY (EGD) WITH PROPOFOL;  Surgeon: Arta Silence, MD;  [facility];  Service: Endoscopy;  Laterality: N/A;  . ESOPHAGOGASTRODUODENOSCOPY (EGD) WITH PROPOFOL N/A 02/13/2017   Procedure: ESOPHAGOGASTRODUODENOSCOPY (EGD) WITH PROPOFOL;  Surgeon: Arta Silence, MD;  Location: [facility];  Service: Endoscopy;  Laterality: N/A;  . EYE SURGERY     cataract sx  . HERNIA REPAIR     x 2  . TOTAL HIP ARTHROPLASTY     bilateral    Medications: I have reviewed the patient's current medications. Allergies: No Known Allergies  Family History  Family history unknown: Yes   Social History:  reports that he has never smoked. He has never used smokeless tobacco. He reports that he does not drink alcohol or use drugs.  Review of Systems  Gastrointestinal: Positive for abdominal pain, nausea and vomiting.  All other systems reviewed and are negative.   Physical Exam:  Vital signs in last 24 hours: Temp:  [98.2 F (36.8 C)-98.4 F (36.9 C)] 98.3 F (36.8  C) (05/31 2005) Pulse Rate:  [65-74] 71 (05/31 2005) Resp:  [14-16] 15 (05/31 2005) BP: (141-172)/(68-80) 165/80 (05/31 2005) SpO2:  [93 %-97 %] 95 % (05/31 2005) Weight:  [80.5 kg (177 lb 7.5 oz)] 80.5 kg (177 lb 7.5 oz) (05/31 0500) Physical Exam   Constitutional: He is oriented to person, place, and time. He appears well-developed and well-nourished.  HENT:  Head: Normocephalic and atraumatic.  Eyes: Pupils are equal, round, and reactive to light. EOM are normal.  Neck: Normal range of motion. No thyromegaly present.  Cardiovascular: Normal rate and regular rhythm.  Respiratory: Effort normal. No respiratory distress.  GI: Soft. He exhibits distension. He exhibits no mass. There is no tenderness. There is no rebound and no guarding.  Genitourinary: Penis normal.  Musculoskeletal: Normal range of motion. He exhibits no tenderness.  Neurological: He is alert and oriented to person, place, and time.  Skin: Skin is warm and dry.  Psychiatric: He has a normal mood and affect. His behavior is normal. Judgment and thought content normal.    Laboratory Data:  Results for orders placed or performed during the hospital encounter of 03/11/18 (from the past 72 hour(s))  Lipase, blood     Status: None   Collection Time: 03/11/18 11:59 AM  Result Value Ref Range   Lipase 23 11 - 51 U/L    Comment: Performed at [facility], [address] [place], [place] [address]  Comprehensive metabolic panel     Status: Abnormal   Collection Time: 03/11/18 11:59 AM  Result Value Ref Range   Sodium 140 135 - 145 mmol/L   Potassium 4.0 3.5 - 5.1 mmol/L   Chloride 107 101 - 111 mmol/L   CO2 23 22 - 32 mmol/L   Glucose, Bld 157 (H) 65 - 99 mg/dL   BUN 13 6 - 20 mg/dL   Creatinine, Ser 0.75 0.61 - 1.24 mg/dL   Calcium 9.0 8.9 - 10.3 mg/dL   Total Protein 7.4 6.5 - 8.1 g/dL   Albumin 4.2 3.5 - 5.0 g/dL   AST 23 15 - 41 U/L   ALT 14 (L) 17 - 63 U/L   Alkaline Phosphatase 81 38 - 126 U/L   Total Bilirubin 0.8 0.3 - 1.2 mg/dL   GFR calc non Af Amer >60 >60 mL/min   GFR calc Af Amer >60 >60 mL/min    Comment: (NOTE) The eGFR has been calculated using the CKD EPI equation. This calculation has not been validated in all clinical  situations. eGFR's persistently <60 mL/min signify possible Chronic Kidney Disease.    Anion gap 10 5 - 15    Comment: Performed at [facility], [address] [place], [place] [address]  CBC     Status: None   Collection Time: 03/11/18 11:59 AM  Result Value Ref Range   WBC 9.9 4.0 - 10.5 K/uL   RBC 5.49 4.22 - 5.81 MIL/uL   Hemoglobin 16.6 13.0 - 17.0 g/dL   HCT 47.8 39.0 - 52.0 %   MCV 87.1 78.0 - 100.0 fL   MCH 30.2 26.0 - 34.0 pg   MCHC 34.7 30.0 - 36.0 g/dL   RDW 13.8 11.5 - 15.5 %   Platelets 192 150 - 400 K/uL    Comment: Performed at [facility], [address] [place], [place] [address]  Urinalysis, Routine w reflex microscopic     Status: Abnormal   Collection Time: 03/11/18 12:00 PM  Result Value Ref Range   Color, Urine YELLOW YELLOW   APPearance CLEAR CLEAR   Specific Gravity, Urine 1.020 1.005 - 1.030   pH 7.0 5.0 - 8.0   Glucose, UA NEGATIVE NEGATIVE mg/dL   Hgb urine dipstick TRACE (A) NEGATIVE   Bilirubin Urine NEGATIVE NEGATIVE   Ketones, ur NEGATIVE NEGATIVE mg/dL   Protein, ur NEGATIVE NEGATIVE mg/dL   Nitrite NEGATIVE NEGATIVE   Leukocytes, UA NEGATIVE NEGATIVE    Comment: Performed at [facility], [address] [place], [place] [address]  Urinalysis, Microscopic (reflex)     Status: Abnormal   Collection Time: 03/11/18 12:00 PM  Result Value Ref Range   RBC / HPF 0-5 0 - 5 RBC/hpf   WBC, UA 0-5 0 - 5 WBC/hpf   Bacteria, UA RARE (A) NONE SEEN   Squamous Epithelial / LPF 0-5 0 - 5    Comment: Performed at [facility], [address] [place], [place] [address]  Magnesium     Status: None   Collection Time: 03/11/18  6:32 PM  Result Value Ref Range   Magnesium 2.1 1.7 - 2.4 mg/dL    Comment: Performed at [facility] [address], [place], [place] [address]  Heparin level (unfractionated)     Status: Abnormal   Collection Time: 03/11/18  7:22 PM  Result Value Ref Range    Heparin Unfractionated >2.20 (H) 0.30 - 0.70 IU/mL    Comment: RESULTS CONFIRMED BY MANUAL DILUTION Performed at [facility] [address] [place], [place] [address]   APTT     Status: None   Collection Time: 03/11/18  7:22 PM  Result Value Ref Range   aPTT 33 24 - 36 seconds    Comment: Performed at [facility], [place] [address] [place], [place] [address]  Basic metabolic panel     Status: Abnormal   Collection Time: 03/12/18  5:16 AM  Result Value Ref Range   Sodium 142 135 - 145 mmol/L   Potassium 4.0 3.5 - 5.1 mmol/L   Chloride 109 101 - 111 mmol/L   CO2 25 22 - 32 mmol/L   Glucose, Bld 122 (H) 65 - 99 mg/dL   BUN 17 6 - 20 mg/dL   Creatinine, Ser 1.04 0.61 - 1.24 mg/dL   Calcium 8.8 (L) 8.9 - 10.3 mg/dL   GFR calc non Af Amer >60 >60 mL/min   GFR calc Af Amer >60 >60 mL/min    Comment: (NOTE) The eGFR has been calculated using the CKD EPI equation. This calculation has not been validated in all clinical situations. eGFR's persistently <60 mL/min signify possible Chronic Kidney Disease.    Anion gap 8 5 - 15    Comment: Performed at [facility], [place] [address], [place], [place] [address]  Protime-INR     Status: Abnormal   Collection Time: 03/12/18  5:16 AM  Result Value Ref Range   Prothrombin Time 16.3 (H) 11.4 - 15.2 seconds   INR 1.32     Comment: Performed at [facility], [place] [address], [place], [place] [address]  Magnesium     Status: None   Collection Time: 03/12/18  5:16 AM  Result Value Ref Range   Magnesium 1.9 1.7 - 2.4 mg/dL    Comment: Performed at [facility], [place] [address], [place], [place] [address]  APTT     Status: Abnormal  Collection Time: 03/12/18  5:16 AM  Result Value Ref Range   aPTT 80 (H) 24 - 36 seconds    Comment:        IF BASELINE aPTT IS ELEVATED, SUGGEST PATIENT RISK ASSESSMENT BE USED TO DETERMINE APPROPRIATE ANTICOAGULANT  THERAPY. Performed at [facility], [place] [address], [place], [place] [address]   CBC     Status: Abnormal   Collection Time: 03/12/18  5:16 AM  Result Value Ref Range   WBC 12.1 (H) 4.0 - 10.5 K/uL   RBC 5.23 4.22 - 5.81 MIL/uL   Hemoglobin 15.7 13.0 - 17.0 g/dL   HCT 46.4 39.0 - 52.0 %   MCV 88.7 78.0 - 100.0 fL   MCH 30.0 26.0 - 34.0 pg   MCHC 33.8 30.0 - 36.0 g/dL   RDW 13.6 11.5 - 15.5 %   Platelets 192 150 - 400 K/uL    Comment: Performed at [facility], [place] [address] [place], [place] [address]  Glucose, capillary     Status: Abnormal   Collection Time: 03/12/18  7:42 AM  Result Value Ref Range   Glucose-Capillary 123 (H) 65 - 99 mg/dL  APTT     Status: Abnormal   Collection Time: 03/12/18 12:56 PM  Result Value Ref Range   aPTT 73 (H) 24 - 36 seconds    Comment:        IF BASELINE aPTT IS ELEVATED, SUGGEST PATIENT RISK ASSESSMENT BE USED TO DETERMINE APPROPRIATE ANTICOAGULANT THERAPY. Performed at [facility], [place] [address], [place], [place] [address]   CBC     Status: None   Collection Time: 03/13/18  5:25 AM  Result Value Ref Range   WBC 6.4 4.0 - 10.5 K/uL   RBC 5.09 4.22 - 5.81 MIL/uL   Hemoglobin 15.2 13.0 - 17.0 g/dL   HCT 45.4 39.0 - 52.0 %   MCV 89.2 78.0 - 100.0 fL   MCH 29.9 26.0 - 34.0 pg   MCHC 33.5 30.0 - 36.0 g/dL   RDW 13.7 11.5 - 15.5 %   Platelets 178 150 - 400 K/uL    Comment: Performed at [facility], [place] [address] [place], [place] [address]  Heparin level (unfractionated)     Status: Abnormal   Collection Time: 03/13/18  5:25 AM  Result Value Ref Range   Heparin Unfractionated 1.32 (H) 0.30 - 0.70 IU/mL    Comment: RESULTS CONFIRMED BY MANUAL DILUTION (NOTE) If heparin results are below expected values, and patient dosage has  been confirmed, suggest follow up testing of antithrombin III levels. Performed at [facility], [place] [address] [place], [place] [address]   APTT     Status: Abnormal   Collection Time: 03/13/18  5:25 AM  Result Value Ref Range   aPTT 78 (H) 24 - 36 seconds    Comment:        IF BASELINE aPTT IS ELEVATED, SUGGEST PATIENT RISK ASSESSMENT BE USED TO DETERMINE APPROPRIATE ANTICOAGULANT THERAPY. Performed at [facility], [place] [address], [place], [place] [address]   Basic metabolic panel     Status: Abnormal   Collection Time: 03/13/18  5:25 AM  Result Value Ref Range   Sodium 144 135 - 145 mmol/L   Potassium 3.9 3.5 - 5.1 mmol/L   Chloride 112 (H) 101 - 111 mmol/L   CO2 29 22 - 32 mmol/L   Glucose, Bld 130 (H) 65 - 99  mg/dL   BUN 25 (H) 6 - 20 mg/dL   Creatinine, Ser 0.97 0.61 - 1.24 mg/dL   Calcium 8.7 (L) 8.9 - 10.3 mg/dL   GFR calc non Af Amer >60 >60 mL/min   GFR calc Af Amer >60 >60 mL/min    Comment: (NOTE) The eGFR has been calculated using the CKD EPI equation. This calculation has not been validated in all clinical situations. eGFR's persistently <60 mL/min signify possible Chronic Kidney Disease.    Anion gap 3 (L) 5 - 15    Comment: Performed at [facility], [place] [address] [place], [place] [address]  Magnesium     Status: None   Collection Time: 03/13/18  5:25 AM  Result Value Ref Range   Magnesium 1.9 1.7 - 2.4 mg/dL    Comment: Performed at [facility], [place] [address] [place], [place] [address]  Glucose, capillary     Status: Abnormal   Collection Time: 03/13/18  8:35 AM  Result Value Ref Range   Glucose-Capillary 131 (H) 65 - 99 mg/dL   No results found for this or any previous visit (from the past 240 hour(s)). Creatinine: Recent Labs    03/11/18 1159 03/12/18 0516 03/13/18 0525  CREATININE 0.75 1.04 0.97   Baseline Creatinine: 0.8  Impression/Assessment:  84yo with a bladder tumor  Plan:  1. Bladder tumor: I discussed the natural history of bladder tumors and the likelihood they represent a  malignacy. I discussed the management including surgical resection and the patient wishes to proceed with surgery pending resolution of his small bowel obstruction. We will have him followup as an outpatient with Dr. Diona Fanti for cystoscopy and scheduling of surgery  Nicolette Bang 03/13/2018, 9:19 PM

## 2018-03-13 NOTE — Progress Notes (Signed)
Patient ID: Jonathan [name], male   DOB: [birth year], 84 y.o.   MRN: [id number]       Subjective: Pt feels better.  Less pain today.  Multiple BMs and flatus.  Objective: Vital signs in last 24 hours: Temp:  [98.2 F (36.8 C)-98.7 F (37.1 C)] 98.3 F (36.8 C) (05/31 0555) Pulse Rate:  [62-71] 71 (05/31 0555) Resp:  [14-16] 14 (05/31 0555) BP: (119-153)/(58-71) 153/71 (05/31 0555) SpO2:  [93 %-96 %] 95 % (05/31 0555) Weight:  [80.5 kg (177 lb 7.5 oz)] 80.5 kg (177 lb 7.5 oz) (05/31 0500) Last BM Date: 03/12/18  Intake/Output from previous day: 05/30 0701 - 05/31 0700 In: 3238 [I.V.:2688; NG/GT:450; IV Piggyback:100] Out: 950 [Urine:600; Emesis/NG output:350] Intake/Output this shift: No intake/output data recorded.  PE: Abd: soft, much less tender today, +BS, ND  Lab Results:  Recent Labs    03/12/18 0516 03/13/18 0525  WBC 12.1* 6.4  HGB 15.7 15.2  HCT 46.4 45.4  PLT 192 178   BMET Recent Labs    03/12/18 0516 03/13/18 0525  NA 142 144  K 4.0 3.9  CL 109 112*  CO2 25 29  GLUCOSE 122* 130*  BUN 17 25*  CREATININE 1.04 0.97  CALCIUM 8.8* 8.7*   PT/INR Recent Labs    03/12/18 0516  LABPROT 16.3*  INR 1.32   CMP     Component Value Date/Time   NA 144 03/13/2018 0525   NA 142 06/23/2017 1210   K 3.9 03/13/2018 0525   CL 112 (H) 03/13/2018 0525   CO2 29 03/13/2018 0525   GLUCOSE 130 (H) 03/13/2018 0525   BUN 25 (H) 03/13/2018 0525   BUN 14 06/23/2017 1210   CREATININE 0.97 03/13/2018 0525   CALCIUM 8.7 (L) 03/13/2018 0525   PROT 7.4 03/11/2018 1159   ALBUMIN 4.2 03/11/2018 1159   AST 23 03/11/2018 1159   ALT 14 (L) 03/11/2018 1159   ALKPHOS 81 03/11/2018 1159   BILITOT 0.8 03/11/2018 1159   GFRNONAA >60 03/13/2018 0525   GFRAA >60 03/13/2018 0525   Lipase     Component Value Date/Time   LIPASE 23 03/11/2018 1159       Studies/Results: Ct Abdomen Pelvis W Contrast  Result Date: 03/11/2018 CLINICAL DATA:  Abdominal pain beginning  today. EXAM: CT ABDOMEN AND PELVIS WITH CONTRAST TECHNIQUE: Multidetector CT imaging of the abdomen and pelvis was performed using the standard protocol following bolus administration of intravenous contrast. CONTRAST:  195mL ISOVUE-300 IOPAMIDOL (ISOVUE-300) INJECTION 61% COMPARISON:  10/21/2008 FINDINGS: Lower chest: Small hiatal hernia. Small to moderate pericardial effusion. Hepatobiliary: The liver appears somewhat small. Question the possibility of cirrhosis. Multiple gallstones dependent in the gallbladder without CT evidence of cholecystitis or obstruction. Pancreas: Normal Spleen: Normal Adrenals/Urinary Tract: Adrenal glands are normal. Kidneys are normal. No cyst, mass, stone or hydronephrosis. No definite bladder abnormality. Cannot rule out a posterior bladder wall mass or clot. This could simply be impression from the very large prostate. Stomach/Bowel: Dilated fluid and air-filled loops of small intestine in the central abdomen with collapsed small intestine distal to that consistent with small bowel obstruction. Specific etiology not clearly discernible. Sigmoid diverticulosis. No evidence of diverticulitis. Vascular/Lymphatic: Massive external iliac adenopathy on the right, extending up 2 4.5 cm in diameter. This is not well seen because of artifact from the hip replacements. Cannot completely rule out that this could represent a cystic abnormality emanating from the right hip or even could represent a pseudo aneurysm. No abdominal retroperitoneal  adenopathy. Aortic atherosclerosis. Reproductive: Enlarged prostate as noted above. Other: Small amount of ascites. Musculoskeletal: Ordinary lumbar degenerative changes. IMPRESSION: Acute small bowel obstruction, probably in the ileum. Specific etiology not discernible. Small amount of associated ascites. Small hiatal hernia.  Small to moderate pericardial effusion. Small liver.  Question cirrhosis.  Chololithiasis. Aortic atherosclerosis without  aneurysm. Markedly enlarged prostate. Not well seen because of artifact from the hips. 4.5 cm abnormality in the right external iliac region that could represent a nodal mass. The differential diagnosis does include a cystic abnormality emanating from the right hip joint and even pseudo aneurysm. In the setting of the enlarged prostate, the possibility of metastatic prostate cancer does exist, though there are no enlarged nodes seen cephalad to that. Electronically Signed   By: Nelson Chimes M.D.   On: 03/11/2018 13:45   Dg Abd Portable 1v-small Bowel Obstruction Protocol-initial, 8 Hr Delay  Result Date: 03/12/2018 CLINICAL DATA:  Followup small bowel protocol EXAM: PORTABLE ABDOMEN - 1 VIEW COMPARISON:  03/11/18 FINDINGS: Scattered large and small bowel gas is noted. Some persistent small bowel dilatation is noted. Nasogastric catheter is seen within the stomach at the superior aspect of the film. No significant contrast is noted on this image. Clinical correlation is recommended. Contrast material is noted within the bladder from the recent CT examination. There remains some inferior indentation likely related to an enlarged prostate although a second filling defect is noted suspicious for underlying bladder mass. Further evaluation is recommended when clinically able. Bilateral hip replacements are noted. Degenerative changes of the lumbar spine are seen. IMPRESSION: Nasogastric catheter within the stomach. No contrast is noted on this exam question whether contrast was administered. Clinical correlation is recommended. Filling defect within the bladder similar to that seen on recent CT suspicious for bladder mass. Further evaluation when clinically able is recommended. Electronically Signed   By: Inez Catalina M.D.   On: 03/12/2018 08:40   Dg Abd Portable 1v-small Bowel Protocol-position Verification  Result Date: 03/11/2018 CLINICAL DATA:  NG tube placement EXAM: PORTABLE ABDOMEN - 1 VIEW COMPARISON:  CT  abdomen/pelvis dated 03/11/2018 FINDINGS: Enteric tube looped in the proximal stomach with its tip in the gastric cardia. Mildly dilated loops of small bowel in the right mid abdomen. This is better evaluated on CT. Excretory contrast in the bladder. Status post bilateral hip arthroplasty. IMPRESSION: Enteric tube looped in the proximal stomach with its tip in the gastric cardia. Electronically Signed   By: Julian Hy M.D.   On: 03/11/2018 21:33    Anti-infectives: Anti-infectives (From admission, onward)   None       Assessment/Plan  PSBO -resolving as he has had multiple BMs. -will DC NGT and start on clear liquids, can advance if tolerates -would cont heparin today and make sure he tolerates his diet.  If so, then resume eliquis tomorrow.   FEN - Clear liquids VTE - heparin drip ID - none   LOS: 2 days    Henreitta Cea , [facility] 03/13/2018, 10:12 AM Pager: [phone]

## 2018-03-13 NOTE — Progress Notes (Addendum)
ANTICOAGULATION CONSULT NOTE - f/u Consult  Pharmacy Consult for apixaban to heparin gtt Indication: atrial fibrillation  No Known Allergies  Patient Measurements: Height: 5\' 7"  (170.2 cm) Weight: 177 lb 7.5 oz (80.5 kg) IBW/kg (Calculated) : 66.1 Heparin Dosing Weight: 79kg  Vital Signs: Temp: 98.3 F (36.8 C) (05/31 0555) Temp Source: Oral (05/31 0555) BP: 153/71 (05/31 0555) Pulse Rate: 71 (05/31 0555)  Labs: Recent Labs    03/11/18 1159  03/11/18 1922 03/12/18 0516 03/12/18 1256 03/13/18 0525  HGB 16.6  --   --  15.7  --  15.2  HCT 47.8  --   --  46.4  --  45.4  PLT 192  --   --  192  --  178  APTT  --    < > 33 80* 73* 78*  LABPROT  --   --   --  16.3*  --   --   INR  --   --   --  1.32  --   --   HEPARINUNFRC  --   --  >2.20*  --   --  1.32*  CREATININE 0.75  --   --  1.04  --  0.97   < > = values in this interval not displayed.   Estimated Creatinine Clearance: 57.7 mL/min (by C-G formula based on SCr of 0.97 mg/dL).  Medical History: Past Medical History:  Diagnosis Date  . Acne rosacea   . Arthritis   . Atrial fibrillation ([place])   . Borderline diabetes   . CAD (coronary artery disease)   . Diabetes mellitus without complication ([place])   . Diverticulosis   . Elevated PSA   . Esophageal stricture   . GERD (gastroesophageal reflux disease)   . History of nuclear stress test    Nuclear stress test 6/18: EF 59, small apical defect (?old MI); no ischemia; Low Risk  . Hypertension   . Myocardial infarction ([place])   . Neck fracture ([place]) 2015  . Shoulder pain    Assessment: 47 YOM presents with abdominal pain secondary to SBO.  Patient on apixaban for h/o afib.  Last dose 5/29 at 0700.  Pharmacy asked to start heparin gtt while off apixaban.    5/29: CBC WNL          Baseline aPTT = 33 sec, heparin level = >2.20 (elevated as expected with apixaban prior to admission)  Today, 03/13/2018  On 5/30 the RN noted NG output looked "rusty". CBC ~ same, Plt 192  unchanged  Patient on Ketorolac prn, has received 4 doses, gives good pain relief  CBC unchanged, wnl  Heparin level = 1.32 units/ml - still elevated with previous Apixaban  APTT = 78 sec, remains in range  Surgery PA noted that NG suction rate was high, reduced and plan to d/c NG tube today, start CL diet  Goal of Therapy:  Heparin level 0.3-0.7 units/ml aPTT 66-102 seconds Monitor platelets by anticoagulation protocol: Yes   Plan:   Continue heparin drip at 1200 units/hr today, likely back to Apixaban tomorrow if tolerates diet.  Due to recent DOAC, continue to use aPTT to monitor heparin gtt until heparin level (anti-Xa) correlates with aPTT.   Daily heparin level and CBC  APTT with am Heparin level 6/1  Minda Ditto PharmD Pager [phone] 03/13/2018, 10:23 AM

## 2018-03-13 NOTE — Progress Notes (Signed)
PROGRESS NOTE    Jonathan [name]  JJK:[id number] DOB: 1931-03-15 DOA: 03/11/2018 PCP: Josetta Huddle, MD   Brief Narrative:  Patient is a 84 year old gentleman history of A. fib on chronic anticoagulation with Eliquis, coronary artery disease, type 2 diabetes, history of esophageal strictures status post multiple dilations, history of hernia repair x3 in the 1960s presented with nausea abdominal pain and noted on CT abdomen and pelvis to have an acute small bowel obstruction.  General surgery consulted.  NG tube placed.  Small bowel protocol followed through being done.   Assessment & Plan:   Principal Problem:   Small bowel obstruction (HCC) Active Problems:   CAD (coronary artery disease)   Persistent atrial fibrillation (HCC)   Essential hypertension   Diabetes mellitus (HCC)   GERD (gastroesophageal reflux disease)   Esophageal stricture   SBO (small bowel obstruction) (HCC)  #1 acute small bowel obstruction Questionable etiology.  Patient noted to have a prior history of hernia repair x3 in the 1960s.  Abdominal film today with filling defect noted in the bladder similar to that seen on CT suspicious for bladder mass, also consistent with small bowel obstruction.  Patient having large bowel movements and passing gas.  NG tube has been discontinued.  Patient started on clear liquids per general surgery.  Keep potassium greater than 4.  Keep magnesium greater than 2.  Continue supportive care.  General surgery following and I appreciate the input and recommendations.   2.  Abnormal abdominal x-ray/??  Bladder mass. Patient currently with acute small bowel obstruction.  Consult with urology for further evaluation and management.   3.  Diabetes mellitus Blood glucose of 131 this morning.  Follow.  4.  Hypertension Increase IV enalapril.  Once tolerating oral intake will resume home regimen of antihypertensive medications.   5.  Persistent atrial fibrillation CHA2DS2VASC score  5 Currently rate controlled.  Eliquis on hold secondary to problem #1 in case of procedure as needed.  Continue IV heparin.  6.  Gastroesophageal reflux disease Continue IV Pepcid.    DVT prophylaxis: Heparin Code Status: Full Family Communication: Updated patient and wife at bedside. Disposition Plan: Likely home once small bowel obstruction is resolved and per general surgery.   Consultants:   General surgery: Dr. Marcello Moores 03/11/2018.  Urology pending  Procedures:   Small bowel follow-through 03/12/2018 pending  CT abdomen and pelvis 03/11/2018  Abdominal film 03/12/2018, 03/11/2018  Antimicrobials:   None   Subjective: Patient sleeping however easily arousable.  Patient states had large bowel movement this morning.  Patient also stated had some bowel movement yesterday.  Passing flatus.  Abdominal pain improving.  No nausea or emesis.    Objective: Vitals:   03/12/18 2146 03/13/18 0500 03/13/18 0555 03/13/18 1338  BP: (!) 145/68  (!) 153/71 (!) 141/72  Pulse: 65  71 68  Resp: 15  14 16   Temp: 98.2 F (36.8 C)  98.3 F (36.8 C) 98.3 F (36.8 C)  TempSrc: Oral  Oral Oral  SpO2: 93%  95% 95%  Weight:  80.5 kg (177 lb 7.5 oz)    Height:        Intake/Output Summary (Last 24 hours) at 03/13/2018 1346 Last data filed at 03/13/2018 1340 Gross per 24 hour  Intake 2920 ml  Output 650 ml  Net 2270 ml   Filed Weights   03/11/18 1152 03/12/18 0500 03/13/18 0500  Weight: 79.4 kg (175 lb) 80 kg (176 lb 5.9 oz) 80.5 kg (177 lb 7.5 oz)  Examination:  General exam: NAD Respiratory system: Clear to auscultation bilaterally.  No wheezes, no crackles, no rhonchi.Marland Kitchen Respiratory effort normal. Cardiovascular system: Regular rate rhythm no murmurs rubs or gallops.  No lower extremity edema.  No JVD.   Gastrointestinal system: Abdomen is soft, distended, nontender to palpation, positive bowel sounds.  No rebound.  No guarding.   Central nervous system: Alert and oriented. No  focal neurological deficits. Extremities: Symmetric 5 x 5 power. Skin: No rashes, lesions or ulcers Psychiatry: Judgement and insight appear normal. Mood & affect appropriate.     Data Reviewed: I have personally reviewed following labs and imaging studies  CBC: Recent Labs  Lab 03/11/18 1159 03/12/18 0516 03/13/18 0525  WBC 9.9 12.1* 6.4  HGB 16.6 15.7 15.2  HCT 47.8 46.4 45.4  MCV 87.1 88.7 89.2  PLT 192 192 500   Basic Metabolic Panel: Recent Labs  Lab 03/11/18 1159 03/11/18 1832 03/12/18 0516 03/13/18 0525  NA 140  --  142 144  K 4.0  --  4.0 3.9  CL 107  --  109 112*  CO2 23  --  25 29  GLUCOSE 157*  --  122* 130*  BUN 13  --  17 25*  CREATININE 0.75  --  1.04 0.97  CALCIUM 9.0  --  8.8* 8.7*  MG  --  2.1 1.9 1.9   GFR: Estimated Creatinine Clearance: 57.7 mL/min (by C-G formula based on SCr of 0.97 mg/dL). Liver Function Tests: Recent Labs  Lab 03/11/18 1159  AST 23  ALT 14*  ALKPHOS 81  BILITOT 0.8  PROT 7.4  ALBUMIN 4.2   Recent Labs  Lab 03/11/18 1159  LIPASE 23   No results for input(s): AMMONIA in the last 168 hours. Coagulation Profile: Recent Labs  Lab 03/12/18 0516  INR 1.32   Cardiac Enzymes: No results for input(s): CKTOTAL, CKMB, CKMBINDEX, TROPONINI in the last 168 hours. BNP (last 3 results) No results for input(s): PROBNP in the last 8760 hours. HbA1C: No results for input(s): HGBA1C in the last 72 hours. CBG: Recent Labs  Lab 03/12/18 0742 03/13/18 0835  GLUCAP 123* 131*   Lipid Profile: No results for input(s): CHOL, HDL, LDLCALC, TRIG, CHOLHDL, LDLDIRECT in the last 72 hours. Thyroid Function Tests: No results for input(s): TSH, T4TOTAL, FREET4, T3FREE, THYROIDAB in the last 72 hours. Anemia Panel: No results for input(s): VITAMINB12, FOLATE, FERRITIN, TIBC, IRON, RETICCTPCT in the last 72 hours. Sepsis Labs: No results for input(s): PROCALCITON, LATICACIDVEN in the last 168 hours.  No results found for this  or any previous visit (from the past 240 hour(s)).       Radiology Studies: Dg Abd Portable 1v-small Bowel Obstruction Protocol-initial, 8 Hr Delay  Result Date: 03/12/2018 CLINICAL DATA:  Followup small bowel protocol EXAM: PORTABLE ABDOMEN - 1 VIEW COMPARISON:  03/11/18 FINDINGS: Scattered large and small bowel gas is noted. Some persistent small bowel dilatation is noted. Nasogastric catheter is seen within the stomach at the superior aspect of the film. No significant contrast is noted on this image. Clinical correlation is recommended. Contrast material is noted within the bladder from the recent CT examination. There remains some inferior indentation likely related to an enlarged prostate although a second filling defect is noted suspicious for underlying bladder mass. Further evaluation is recommended when clinically able. Bilateral hip replacements are noted. Degenerative changes of the lumbar spine are seen. IMPRESSION: Nasogastric catheter within the stomach. No contrast is noted on this exam question whether  contrast was administered. Clinical correlation is recommended. Filling defect within the bladder similar to that seen on recent CT suspicious for bladder mass. Further evaluation when clinically able is recommended. Electronically Signed   By: Inez Catalina M.D.   On: 03/12/2018 08:40   Dg Abd Portable 1v-small Bowel Protocol-position Verification  Result Date: 03/11/2018 CLINICAL DATA:  NG tube placement EXAM: PORTABLE ABDOMEN - 1 VIEW COMPARISON:  CT abdomen/pelvis dated 03/11/2018 FINDINGS: Enteric tube looped in the proximal stomach with its tip in the gastric cardia. Mildly dilated loops of small bowel in the right mid abdomen. This is better evaluated on CT. Excretory contrast in the bladder. Status post bilateral hip arthroplasty. IMPRESSION: Enteric tube looped in the proximal stomach with its tip in the gastric cardia. Electronically Signed   By: Julian Hy M.D.   On:  03/11/2018 21:33        Scheduled Meds: . bisacodyl  10 mg Rectal Daily  . enalaprilat  0.625 mg Intravenous Q6H  . finasteride  5 mg Oral q morning - 10a  . lip balm  1 application Topical BID  . senna  1 tablet Oral BID  . sodium chloride flush  3 mL Intravenous Q12H   Continuous Infusions: . sodium chloride 100 mL/hr at 03/13/18 0200  . famotidine (PEPCID) IV 20 mg (03/13/18 1123)  . heparin 1,200 Units/hr (03/11/18 2053)  . lactated ringers    . methocarbamol (ROBAXIN)  IV    . ondansetron (ZOFRAN) IV       LOS: 2 days    Time spent: 35 minutes    Irine Seal, MD [facility] Pager [phone]  If 7PM-7AM, please contact night-coverage [email or web address] Password [email or web address] 03/13/2018, 1:46 PM

## 2018-03-14 ENCOUNTER — Inpatient Hospital Stay (HOSPITAL_COMMUNITY): Payer: Medicare Other

## 2018-03-14 LAB — CBC
HCT: 44.5 % (ref 39.0–52.0)
HCT: 44.2 % (ref 39.0–52.0)
HEMOGLOBIN: 15 g/dL (ref 13.0–17.0)
Hemoglobin: 15.1 g/dL (ref 13.0–17.0)
MCH: 29.6 pg (ref 26.0–34.0)
MCH: 29.5 pg (ref 26.0–34.0)
MCHC: 33.7 g/dL (ref 30.0–36.0)
MCHC: 34.2 g/dL (ref 30.0–36.0)
MCV: 87.8 fL (ref 78.0–100.0)
MCV: 86.5 fL (ref 78.0–100.0)
PLATELETS: 187 10*3/uL (ref 150–400)
Platelets: 171 10*3/uL (ref 150–400)
RBC: 5.07 MIL/uL (ref 4.22–5.81)
RBC: 5.11 MIL/uL (ref 4.22–5.81)
RDW: 13.6 % (ref 11.5–15.5)
RDW: 13.3 % (ref 11.5–15.5)
WBC: 3.2 10*3/uL — ABNORMAL LOW (ref 4.0–10.5)
WBC: 2.5 10*3/uL — AB (ref 4.0–10.5)

## 2018-03-14 LAB — BASIC METABOLIC PANEL
ANION GAP: 8 (ref 5–15)
BUN: 17 mg/dL (ref 6–20)
CALCIUM: 8.6 mg/dL — AB (ref 8.9–10.3)
CO2: 26 mmol/L (ref 22–32)
Chloride: 108 mmol/L (ref 101–111)
Creatinine, Ser: 0.84 mg/dL (ref 0.61–1.24)
GFR calc Af Amer: >60 mL/min (ref >60)
Glucose, Bld: 183 mg/dL — ABNORMAL HIGH (ref 65–99)
Potassium: 3.7 mmol/L (ref 3.5–5.1)
SODIUM: 142 mmol/L (ref 135–145)

## 2018-03-14 LAB — HEPARIN LEVEL (UNFRACTIONATED): HEPARIN UNFRACTIONATED: 0.91 [IU]/mL — AB (ref 0.30–0.70)

## 2018-03-14 LAB — MAGNESIUM: Magnesium: 1.7 mg/dL (ref 1.7–2.4)

## 2018-03-14 LAB — GLUCOSE, CAPILLARY: Glucose-Capillary: 189 mg/dL — ABNORMAL HIGH (ref 65–99)

## 2018-03-14 LAB — APTT: aPTT: 68 seconds — ABNORMAL HIGH (ref 24–36)

## 2018-03-14 MED ORDER — DIPHENHYDRAMINE HCL 50 MG/ML IJ SOLN: 12.5000 mg | Freq: Every evening | INTRAMUSCULAR | Status: DC | PRN

## 2018-03-14 MED ORDER — POTASSIUM CHLORIDE CRYS ER 20 MEQ PO TBCR
20.0000 meq | EXTENDED_RELEASE_TABLET | Freq: Once | ORAL | Status: DC
  Filled 2018-03-14 (×2): qty 1

## 2018-03-14 MED ORDER — ISOSORBIDE MONONITRATE ER 30 MG PO TB24: 30.0000 mg | ORAL_TABLET | Freq: Every day | ORAL | Status: DC

## 2018-03-14 MED ORDER — HEPARIN (PORCINE) IN NACL 100-0.45 UNIT/ML-% IJ SOLN
1250.0000 [IU]/h | INTRAMUSCULAR | Status: DC
  Administered 2018-03-15: 1250 [IU]/h via INTRAVENOUS
  Filled 2018-03-14 (×2): qty 250

## 2018-03-14 MED ORDER — ENALAPRILAT 1.25 MG/ML IV SOLN
1.2500 mg | Freq: Four times a day (QID) | INTRAVENOUS | Status: DC
  Administered 2018-03-14 – 2018-03-17 (×10): 1.25 mg via INTRAVENOUS
  Filled 2018-03-14 (×11): qty 1

## 2018-03-14 MED ORDER — AMLODIPINE BESYLATE 5 MG PO TABS: 5.0000 mg | ORAL_TABLET | Freq: Every day | ORAL | Status: DC

## 2018-03-14 MED ORDER — MAGNESIUM SULFATE 4 GM/100ML IV SOLN
4.0000 g | Freq: Once | INTRAVENOUS | Status: AC
  Administered 2018-03-14: 4 g via INTRAVENOUS
  Filled 2018-03-14: qty 100

## 2018-03-14 MED ORDER — POTASSIUM CHLORIDE CRYS ER 20 MEQ PO TBCR: 40.0000 meq | EXTENDED_RELEASE_TABLET | Freq: Once | ORAL | Status: DC

## 2018-03-14 NOTE — Progress Notes (Signed)
ANTICOAGULATION CONSULT NOTE - Follow Up Consult  Pharmacy Consult for heparin Indication: hx atrial fibrillation (home Eliquis on hold)  No Known Allergies  Patient Measurements: Height: 5\' 7"  (170.2 cm) Weight: 173 lb 4.8 oz (78.6 kg) IBW/kg (Calculated) : 66.1 Heparin Dosing Weight: 79 kg  Vital Signs: Temp: 98.3 F (36.8 C) (06/01 0603) Temp Source: Oral (06/01 0603) BP: 161/82 (06/01 0603) Pulse Rate: 69 (06/01 0603)  Labs: Recent Labs    03/11/18 1922 03/12/18 0516 03/12/18 1256 03/13/18 0525 03/14/18 0459  HGB  --  15.7  --  15.2 15.0  HCT  --  46.4  --  45.4 44.5  PLT  --  192  --  178 171  APTT 33 80* 73* 78* 68*  LABPROT  --  16.3*  --   --   --   INR  --  1.32  --   --   --   HEPARINUNFRC >2.20*  --   --  1.32* 0.91*  CREATININE  --  1.04  --  0.97 0.84    Estimated Creatinine Clearance: 61.2 mL/min (by C-G formula based on SCr of 0.84 mg/dL).   Medications:  Eliquis 5 mg bid PTA  Assessment: Patient's an 84 y.o M with hx Afib on eliquis PTA, presented to the ED on 5/29 with c/o abdominal pain. Abd CTA showed SBO. Patient was transitioned to heparin drip on admission.  Today, 03/14/2018: - aPTT is therapeutic at 68, but at lower end of therapeutic range; heparin level is elevated at 0.91 but this is likely from residual effect of Eliquis.  Will adj heparin rate based on aPTT level at this time until both heparin level and aPTT levels correlate - cbc stable - still with n/v, not tolerating diet   Goal of Therapy:  Heparin level 0.3-0.7 units/ml aPTT 66-102 seconds Monitor platelets by anticoagulation protocol: Yes   Plan:  - increase heparin rate slightly to 1250 units/hr - f/u with aPTT and heparin level on 6/2 and adjust rate if needed - monitor for s/s bleeding  Mabell Esguerra P 03/14/2018,10:22 AM

## 2018-03-14 NOTE — Progress Notes (Signed)
   Subjective/Chief Complaint: bloating Not tolerating diet  Bloated with vomiting  Had a BM    Objective: Vital signs in last 24 hours: Temp:  [98.3 F (36.8 C)-98.4 F (36.9 C)] 98.3 F (36.8 C) (06/01 0603) Pulse Rate:  [68-74] 69 (06/01 0603) Resp:  [15-16] 15 (06/01 0603) BP: (141-172)/(72-82) 161/82 (06/01 0603) SpO2:  [95 %-97 %] 95 % (06/01 0603) Weight:  [78.6 kg (173 lb 4.8 oz)] 78.6 kg (173 lb 4.8 oz) (06/01 0500) Last BM Date: 03/13/18  Intake/Output from previous day: 05/31 0701 - 06/01 0700 In: 2648.8 [P.O.:480; I.V.:2068.8; IV Piggyback:100] Out: 200 [Urine:200] Intake/Output this shift: No intake/output data recorded.  General appearance: alert and cooperative Head: Normocephalic, without obvious abnormality, atraumatic GI: DISTENDED  QUIET MINIMAL TENDERNESS   Lab Results:  Recent Labs    03/13/18 0525 03/14/18 0459  WBC 6.4 3.2*  HGB 15.2 15.0  HCT 45.4 44.5  PLT 178 171   BMET Recent Labs    03/13/18 0525 03/14/18 0459  NA 144 142  K 3.9 3.7  CL 112* 108  CO2 29 26  GLUCOSE 130* 183*  BUN 25* 17  CREATININE 0.97 0.84  CALCIUM 8.7* 8.6*   PT/INR Recent Labs    03/12/18 0516  LABPROT 16.3*  INR 1.32   ABG No results for input(s): PHART, HCO3 in the last 72 hours.  Invalid input(s): PCO2, PO2  Studies/Results: No results found.  Anti-infectives: Anti-infectives (From admission, onward)   None      Assessment/Plan: Patient Active Problem List   Diagnosis Date Noted  . Small bowel obstruction ([place]) 03/11/2018  . Diabetes mellitus ([place]) 03/11/2018  . GERD (gastroesophageal reflux disease) 03/11/2018  . Esophageal stricture 03/11/2018  . SBO (small bowel obstruction) ([place])   . Symptomatic sinus bradycardia   . Persistent atrial fibrillation ([place]) 02/14/2017  . Essential hypertension 02/14/2017  . CAD (coronary artery disease) 07/10/2015  . Fatigue 07/10/2015   MORE BLOATED AND VOMITING  MAY NEED NGT IF HE VOMITS  ANYMORE  NPO KUB RECHECK LABS    LOS: 3 days    Marcello Moores A  [place] 03/14/2018

## 2018-03-14 NOTE — Progress Notes (Signed)
PROGRESS NOTE    Jonathan [name]  GMW:[id number] DOB: 1930/12/19 DOA: 03/11/2018 PCP: Josetta Huddle, MD   Brief Narrative:  Patient is a 84 year old gentleman history of A. fib on chronic anticoagulation with Eliquis, coronary artery disease, type 2 diabetes, history of esophageal strictures status post multiple dilations, history of hernia repair x3 in the 1960s presented with nausea abdominal pain and noted on CT abdomen and pelvis to have an acute small bowel obstruction.  General surgery consulted.  NG tube placed.  Small bowel protocol followed through being done.   Assessment & Plan:   Principal Problem:   Small bowel obstruction (HCC) Active Problems:   CAD (coronary artery disease)   Persistent atrial fibrillation (HCC)   Essential hypertension   Diabetes mellitus (HCC)   GERD (gastroesophageal reflux disease)   Esophageal stricture   SBO (small bowel obstruction) (HCC)  #1 acute small bowel obstruction Questionable etiology.  Patient noted to have a prior history of hernia repair x3 in the 1960s.  Abdominal film today with filling defect noted in the bladder similar to that seen on CT suspicious for bladder mass, also consistent with small bowel obstruction.  Patient noted to have large bowel movement and passing gas on 03/13/2018.  NG tube was removed.  Patient started on clears.  Patient noted to have a bout of emesis today and still with abdominal distention.  Patient has been made n.p.o.  Abdominal films were ordered showing persistent small bowel obstruction.  Continue bowel rest.  IV fluids.  Supportive care.  General surgery following.     2.  Bladder mass. Patient currently with acute small bowel obstruction.  Bladder mass/bladder tumor noted on CT.  Urology was consulted has assessed the patient and patient noted to have a bladder tumor.  Urology recommending outpatient follow-up for further evaluation and management with patient's urologist Dr. Diona Fanti.   3.   Diabetes mellitus Blood glucose of 189 this morning.  Follow.  4.  Hypertension The antibiotics were discontinued early on today and patient started on oral antihypertensive medications.  Patient however with a bout of emesis and currently has been made n.p.o. due to concerns for recurrent small bowel obstruction.  Will place back on IV enalapril and discontinue oral antihypertensive medications for now.    5.  Persistent atrial fibrillation CHA2DS2VASC score 5 Currently rate controlled.  Eliquis on hold secondary to problem #1 in case of procedure as needed.  Continue IV heparin.  6.  Gastroesophageal reflux disease Continue IV Pepcid.    DVT prophylaxis: Heparin Code Status: Full Family Communication: Updated patient and wife at bedside. Disposition Plan: Likely home once small bowel obstruction is resolved and per general surgery.   Consultants:   General surgery: Dr. Marcello Moores 03/11/2018.  Urology: Dr. Alyson Ingles 03/13/2018  Procedures:   Small bowel follow-through 03/12/2018 pending  CT abdomen and pelvis 03/11/2018  Abdominal film 03/12/2018, 03/11/2018, 03/14/2018  Antimicrobials:   None   Subjective: Patient laying in bed seems somewhat uncomfortable.  States he drank Coke pretty fast and had a bout of emesis.  Denies any abdominal pain.  Denies any flatus today or bowel movement today.  States had a large bowel movement yesterday.   Objective: Vitals:   03/13/18 2005 03/14/18 0500 03/14/18 0603 03/14/18 1336  BP: (!) 165/80  (!) 161/82 (!) 155/74  Pulse: 71  69 92  Resp: 15  15 17   Temp: 98.3 F (36.8 C)  98.3 F (36.8 C) 99 F (37.2 C)  TempSrc: Oral  Oral Oral  SpO2: 95%  95% 93%  Weight:  78.6 kg (173 lb 4.8 oz)    Height:        Intake/Output Summary (Last 24 hours) at 03/14/2018 1909 Last data filed at 03/14/2018 1800 Gross per 24 hour  Intake 1974.83 ml  Output 400 ml  Net 1574.83 ml   Filed Weights   03/12/18 0500 03/13/18 0500 03/14/18 0500  Weight:  80 kg (176 lb 5.9 oz) 80.5 kg (177 lb 7.5 oz) 78.6 kg (173 lb 4.8 oz)    Examination:  General exam: NAD Respiratory system: CTAB.  No wheezes, no crackles, no rhonchi.  Normal respiratory effort.   Cardiovascular system: RRR no murmurs rubs or gallops.  No lower extremity edema.  No JVD.  Gastrointestinal system: Abdomen is tender, soft, nontender to palpation, hypoactive bowel sounds.  No rebound.  No guarding.  Central nervous system: Alert and oriented. No focal neurological deficits. Extremities: Symmetric 5 x 5 power. Skin: No rashes, lesions or ulcers Psychiatry: Judgement and insight appear normal. Mood & affect appropriate.     Data Reviewed: I have personally reviewed following labs and imaging studies  CBC: Recent Labs  Lab 03/11/18 1159 03/12/18 0516 03/13/18 0525 03/14/18 0459 03/14/18 1118  WBC 9.9 12.1* 6.4 3.2* 2.5*  HGB 16.6 15.7 15.2 15.0 15.1  HCT 47.8 46.4 45.4 44.5 44.2  MCV 87.1 88.7 89.2 87.8 86.5  PLT 192 192 178 171 409   Basic Metabolic Panel: Recent Labs  Lab 03/11/18 1159 03/11/18 1832 03/12/18 0516 03/13/18 0525 03/14/18 0459  NA 140  --  142 144 142  K 4.0  --  4.0 3.9 3.7  CL 107  --  109 112* 108  CO2 23  --  25 29 26   GLUCOSE 157*  --  122* 130* 183*  BUN 13  --  17 25* 17  CREATININE 0.75  --  1.04 0.97 0.84  CALCIUM 9.0  --  8.8* 8.7* 8.6*  MG  --  2.1 1.9 1.9 1.7   GFR: Estimated Creatinine Clearance: 61.2 mL/min (by C-G formula based on SCr of 0.84 mg/dL). Liver Function Tests: Recent Labs  Lab 03/11/18 1159  AST 23  ALT 14*  ALKPHOS 81  BILITOT 0.8  PROT 7.4  ALBUMIN 4.2   Recent Labs  Lab 03/11/18 1159  LIPASE 23   No results for input(s): AMMONIA in the last 168 hours. Coagulation Profile: Recent Labs  Lab 03/12/18 0516  INR 1.32   Cardiac Enzymes: No results for input(s): CKTOTAL, CKMB, CKMBINDEX, TROPONINI in the last 168 hours. BNP (last 3 results) No results for input(s): PROBNP in the last 8760  hours. HbA1C: No results for input(s): HGBA1C in the last 72 hours. CBG: Recent Labs  Lab 03/12/18 0742 03/13/18 0835 03/14/18 0758  GLUCAP 123* 131* 189*   Lipid Profile: No results for input(s): CHOL, HDL, LDLCALC, TRIG, CHOLHDL, LDLDIRECT in the last 72 hours. Thyroid Function Tests: No results for input(s): TSH, T4TOTAL, FREET4, T3FREE, THYROIDAB in the last 72 hours. Anemia Panel: No results for input(s): VITAMINB12, FOLATE, FERRITIN, TIBC, IRON, RETICCTPCT in the last 72 hours. Sepsis Labs: No results for input(s): PROCALCITON, LATICACIDVEN in the last 168 hours.  No results found for this or any previous visit (from the past 240 hour(s)).       Radiology Studies: Dg Abd 1 View  Result Date: 03/14/2018 CLINICAL DATA:  Abdominal pain and bloating in a patient with a small bowel obstruction. EXAM: ABDOMEN -  1 VIEW COMPARISON:  Single-view of the abdomen 03/11/2018 and 03/12/2018. FINDINGS: There has been increase in gaseous distention of small bowel with loops measuring up to approximately 5.7 cm seen on today's examination compared to 4.3 cm on the study 2 days ago. There is also gaseous distention of the stomach. NG tube seen on the most recent examination is no longer visualized. Bilateral hip arthroplasties are in place. Asymmetric positioning of the femoral head on the right is consistent with wear of the acetabular lining. IMPRESSION: Worsened appearance of small-bowel obstruction. NG tube seen on yesterday's study is not visualized today and presumably has been removed. Electronically Signed   By: Inge Rise M.D.   On: 03/14/2018 11:46        Scheduled Meds: . amLODipine  5 mg Oral Daily  . bisacodyl  10 mg Rectal Daily  . finasteride  5 mg Oral q morning - 10a  . isosorbide mononitrate  30 mg Oral Q lunch  . lip balm  1 application Topical BID  . potassium chloride  20 mEq Oral Once  . senna  1 tablet Oral BID  . sodium chloride flush  3 mL Intravenous  Q12H  . sodium chloride flush  3 mL Intravenous Q12H   Continuous Infusions: . sodium chloride 100 mL/hr at 03/14/18 1135  . sodium chloride    . famotidine (PEPCID) IV Stopped (03/14/18 1205)  . heparin 1,250 Units/hr (03/14/18 1136)  . lactated ringers    . methocarbamol (ROBAXIN)  IV    . ondansetron (ZOFRAN) IV       LOS: 3 days    Time spent: 35 minutes    Irine Seal, MD [facility] Pager [phone]  If 7PM-7AM, please contact night-coverage [email or web address] Password [email or web address] 03/14/2018, 7:09 PM

## 2018-03-15 ENCOUNTER — Inpatient Hospital Stay (HOSPITAL_COMMUNITY): Payer: Medicare Other

## 2018-03-15 DIAGNOSIS — E876 Hypokalemia: Secondary | ICD-10-CM

## 2018-03-15 DIAGNOSIS — N3289 Other specified disorders of bladder: Secondary | ICD-10-CM | POA: Diagnosis present

## 2018-03-15 LAB — COMPREHENSIVE METABOLIC PANEL
ALT: 11 U/L — ABNORMAL LOW (ref 17–63)
AST: 16 U/L (ref 15–41)
Albumin: 2.7 g/dL — ABNORMAL LOW (ref 3.5–5.0)
Alkaline Phosphatase: 42 U/L (ref 38–126)
Anion gap: 9 (ref 5–15)
BUN: 22 mg/dL — AB (ref 6–20)
CHLORIDE: 110 mmol/L (ref 101–111)
CO2: 22 mmol/L (ref 22–32)
Calcium: 8 mg/dL — ABNORMAL LOW (ref 8.9–10.3)
Creatinine, Ser: 0.83 mg/dL (ref 0.61–1.24)
GFR calc Af Amer: >60 mL/min (ref >60)
Glucose, Bld: 137 mg/dL — ABNORMAL HIGH (ref 65–99)
POTASSIUM: 3 mmol/L — AB (ref 3.5–5.1)
SODIUM: 141 mmol/L (ref 135–145)
Total Bilirubin: 1.1 mg/dL (ref 0.3–1.2)
Total Protein: 5.2 g/dL — ABNORMAL LOW (ref 6.5–8.1)

## 2018-03-15 LAB — CBC
HEMATOCRIT: 37.6 % — AB (ref 39.0–52.0)
Hemoglobin: 12.5 g/dL — ABNORMAL LOW (ref 13.0–17.0)
MCH: 28.9 pg (ref 26.0–34.0)
MCHC: 33.2 g/dL (ref 30.0–36.0)
MCV: 87 fL (ref 78.0–100.0)
Platelets: 173 10*3/uL (ref 150–400)
RBC: 4.32 MIL/uL (ref 4.22–5.81)
RDW: 13.7 % (ref 11.5–15.5)
WBC: 6.7 10*3/uL (ref 4.0–10.5)

## 2018-03-15 LAB — GLUCOSE, CAPILLARY: Glucose-Capillary: 104 mg/dL — ABNORMAL HIGH (ref 65–99)

## 2018-03-15 LAB — HEPARIN LEVEL (UNFRACTIONATED)
Heparin Unfractionated: 0.66 IU/mL (ref 0.30–0.70)
Heparin Unfractionated: 0.66 IU/mL (ref 0.30–0.70)

## 2018-03-15 LAB — APTT
APTT: 50 s — AB (ref 24–36)
aPTT: 85 seconds — ABNORMAL HIGH (ref 24–36)

## 2018-03-15 LAB — MAGNESIUM: Magnesium: 1.7 mg/dL (ref 1.7–2.4)

## 2018-03-15 MED ORDER — HEPARIN (PORCINE) IN NACL 100-0.45 UNIT/ML-% IJ SOLN
1350.0000 [IU]/h | INTRAMUSCULAR | Status: AC
  Administered 2018-03-15 – 2018-03-16 (×2): 1350 [IU]/h via INTRAVENOUS
  Filled 2018-03-15 (×3): qty 250

## 2018-03-15 MED ORDER — POTASSIUM CHLORIDE 10 MEQ/100ML IV SOLN
10.0000 meq | INTRAVENOUS | Status: AC
  Administered 2018-03-15 (×5): 10 meq via INTRAVENOUS
  Filled 2018-03-15 (×5): qty 100

## 2018-03-15 MED ORDER — MAGNESIUM SULFATE 4 GM/100ML IV SOLN
4.0000 g | Freq: Once | INTRAVENOUS | Status: AC
  Administered 2018-03-15: 4 g via INTRAVENOUS
  Filled 2018-03-15: qty 100

## 2018-03-15 NOTE — Progress Notes (Signed)
Subjective/Chief Complaint: bloating Patient feeling better. No nausea or vomiting.  No bloating or cramping. Passed some gas. Many bowel movements. Patient's wife and his nurse in the room.  Both feel he is getting better.   Objective: Vital signs in last 24 hours: Temp:  [97.7 F (36.5 C)-99 F (37.2 C)] 97.7 F (36.5 C) (06/02 0549) Pulse Rate:  [63-92] 63 (06/02 0549) Resp:  [16-17] 16 (06/02 0549) BP: (135-155)/(63-74) 135/65 (06/02 0549) SpO2:  [92 %-93 %] 93 % (06/02 0549) Last BM Date: 03/13/18  Intake/Output from previous day: 06/01 0701 - 06/02 0700 In: 940.8 [P.O.:240; I.V.:550.8; IV Piggyback:150] Out: 200 [Urine:200] Intake/Output this shift: No intake/output data recorded.  General: Pt awake/alert/oriented x4 in no major acute distress Eyes: PERRL, normal EOM. Sclera nonicteric Neuro: CN II-XII intact w/o focal sensory/motor deficits. Lymph: No head/neck/groin lymphadenopathy Psych:  No delerium/psychosis/paranoia HENT: Normocephalic, Mucus membranes moist.  No thrush Neck: Supple, No tracheal deviation Chest: No pain.  Good respiratory excursion. CV:  Pulses intact.  Regular rhythm MS: Normal AROM mjr joints.  No obvious deformity Abdomen: Obese but soft.  Mildly distended  Nontender.  No incarcerated hernias. Ext:  SCDs BLE.  No significant edema.  No cyanosis Skin: No petechiae / purpura  Lab Results:  Recent Labs    03/14/18 1118 03/15/18 0356  WBC 2.5* 6.7  HGB 15.1 12.5*  HCT 44.2 37.6*  PLT 187 173   BMET Recent Labs    03/14/18 0459 03/15/18 0356  NA 142 141  K 3.7 3.0*  CL 108 110  CO2 26 22  GLUCOSE 183* 137*  BUN 17 22*  CREATININE 0.84 0.83  CALCIUM 8.6* 8.0*   PT/INR No results for input(s): LABPROT, INR in the last 72 hours. ABG No results for input(s): PHART, HCO3 in the last 72 hours.  Invalid input(s): PCO2, PO2  Studies/Results: Dg Abd 1 View  Result Date: 03/14/2018 CLINICAL DATA:  Abdominal pain and  bloating in a patient with a small bowel obstruction. EXAM: ABDOMEN - 1 VIEW COMPARISON:  Single-view of the abdomen 03/11/2018 and 03/12/2018. FINDINGS: There has been increase in gaseous distention of small bowel with loops measuring up to approximately 5.7 cm seen on today's examination compared to 4.3 cm on the study 2 days ago. There is also gaseous distention of the stomach. NG tube seen on the most recent examination is no longer visualized. Bilateral hip arthroplasties are in place. Asymmetric positioning of the femoral head on the right is consistent with wear of the acetabular lining. IMPRESSION: Worsened appearance of small-bowel obstruction. NG tube seen on yesterday's study is not visualized today and presumably has been removed. Electronically Signed   By: Inge Rise M.D.   On: 03/14/2018 11:46    Anti-infectives: Anti-infectives (From admission, onward)   None      Assessment/Plan: Patient Active Problem List   Diagnosis Date Noted  . Small bowel obstruction ([place]) 03/11/2018  . Diabetes mellitus ([place]) 03/11/2018  . GERD (gastroesophageal reflux disease) 03/11/2018  . Esophageal stricture 03/11/2018  . SBO (small bowel obstruction) ([place])   . Symptomatic sinus bradycardia   . Persistent atrial fibrillation ([place]) 02/14/2017  . Essential hypertension 02/14/2017  . CAD (coronary artery disease) 07/10/2015  . Fatigue 07/10/2015   Partial small bowel obstruction seems to be improving clinically.  Obtain films.  Retry clears.  If does well advance to pured/full liquid diet tomorrow.  Try and get up and mobilize more.  Follow and correct electrolytes.  If he has vomiting again, replace nasogastric tube and most likely will need abdominal exploration if not opened up 48 hours after that.  I updated the patient's status to the patient, spouse, and nurse.  Recommendations were made.  Questions were answered.  They expressed understanding & appreciation.    LOS: 4 days    Adin Hector, MD, FACS, MASCRS Gastrointestinal and Minimally Invasive Surgery    [address], [place], [place] [address] [phone] Main / Paging [phone] Fax

## 2018-03-15 NOTE — Progress Notes (Signed)
ANTICOAGULATION CONSULT NOTE - Follow Up Consult  Pharmacy Consult for heparin Indication: hx atrial fibrillation (home Eliquis on hold)  No Known Allergies  Patient Measurements: Height: 5\' 7"  (170.2 cm) Weight: 173 lb 4.8 oz (78.6 kg) IBW/kg (Calculated) : 66.1 Heparin Dosing Weight: 79 kg  Vital Signs: Temp: 97.7 F (36.5 C) (06/02 0549) Temp Source: Oral (06/02 0549) BP: 135/65 (06/02 0549) Pulse Rate: 63 (06/02 0549)  Labs: Recent Labs    03/13/18 0525 03/14/18 0459 03/14/18 1118 03/15/18 0356  HGB 15.2 15.0 15.1 12.5*  HCT 45.4 44.5 44.2 37.6*  PLT 178 171 187 173  APTT 78* 68*  --  50*  HEPARINUNFRC 1.32* 0.91*  --  0.66  CREATININE 0.97 0.84  --  0.83    Estimated Creatinine Clearance: 61.9 mL/min (by C-G formula based on SCr of 0.83 mg/dL).   Medications:  Eliquis 5 mg bid PTA  Assessment: Patient's an 84 y.o M with hx Afib on eliquis PTA, presented to the ED on 5/29 with c/o abdominal pain. Abd CTA showed SBO. Patient was transitioned to heparin drip on admission.  Today, 03/15/2018: - aPTT is subtherapeutic at 50;  heparin level is 0.66 but this is still likely from residual effect of Eliquis.  Will adj heparin rate based on aPTT level at this time until both heparin level and aPTT levels correlate - hgb down 12.5, plts ok - still with n/v, not tolerating diet   Goal of Therapy:  Heparin level 0.3-0.7 units/ml aPTT 66-102 seconds Monitor platelets by anticoagulation protocol: Yes   Plan:  - increase heparin rate slightly to 1350 units/hr - check 8 hr aPTT and heparin level - monitor for s/s bleeding  Jahzier Villalon P 03/15/2018,9:19 AM

## 2018-03-15 NOTE — Progress Notes (Signed)
ANTICOAGULATION CONSULT NOTE - Follow Up Consult  Pharmacy Consult for heparin Indication: hx atrial fibrillation (home Eliquis on hold)  No Known Allergies  Patient Measurements: Height: 5\' 7"  (170.2 cm) Weight: 173 lb 4.8 oz (78.6 kg) IBW/kg (Calculated) : 66.1 Heparin Dosing Weight: 79 kg  Vital Signs: Temp: 98.4 F (36.9 C) (06/02 1350) Temp Source: Oral (06/02 1350) BP: 161/71 (06/02 1350) Pulse Rate: 70 (06/02 1350)  Labs: Recent Labs    03/13/18 0525 03/14/18 0459 03/14/18 1118 03/15/18 0356 03/15/18 1808  HGB 15.2 15.0 15.1 12.5*  --   HCT 45.4 44.5 44.2 37.6*  --   PLT 178 171 187 173  --   APTT 78* 68*  --  50* 85*  HEPARINUNFRC 1.32* 0.91*  --  0.66 0.66  CREATININE 0.97 0.84  --  0.83  --     Estimated Creatinine Clearance: 61.9 mL/min (by C-G formula based on SCr of 0.83 mg/dL).   Medications:  Eliquis 5 mg bid PTA  Assessment: Patient's an 84 y.o M with hx Afib on eliquis PTA, presented to the ED on 5/29 with c/o abdominal pain. Abd CTA showed SBO. Patient was transitioned to heparin drip on admission.  Today, 03/15/2018: - aPTT is subtherapeutic at 50;  heparin level is 0.66 but this is still likely from residual effect of Eliquis.  Will adj heparin rate based on aPTT level at this time until both heparin level and aPTT levels correlate - hgb down 12.5, plts ok - still with n/v, not tolerating diet  2nd shift update: - aPTT= 85 sec & HL = 0.66 with heparin infusing @ 1350 units/hr - no complications of therapy noted - aPTT and HL correlate, so will continue monitoring heparin with only HL  Goal of Therapy:  Heparin level 0.3-0.7 units/ml aPTT 66-102 seconds Monitor platelets by anticoagulation protocol: Yes   Plan:  - continue heparin rate @ 1350 units/hr - check daily aPTT and heparin level  Harrie Cazarez, Toribio Harbour, PharmD 03/15/2018,7:27 PM

## 2018-03-15 NOTE — Plan of Care (Signed)
Plan of care discussed with patient and wife 

## 2018-03-15 NOTE — Progress Notes (Signed)
PROGRESS NOTE    Jonathan [name]  AST:[id number] DOB: August 19, 1931 DOA: 03/11/2018 PCP: Josetta Huddle, MD   Brief Narrative:  Patient is a 84 year old gentleman history of A. fib on chronic anticoagulation with Eliquis, coronary artery disease, type 2 diabetes, history of esophageal strictures status post multiple dilations, history of hernia repair x3 in the 1960s presented with nausea abdominal pain and noted on CT abdomen and pelvis to have an acute small bowel obstruction.  General surgery consulted.  NG tube placed.  Small bowel protocol followed through being done.   Assessment & Plan:   Principal Problem:   Small bowel obstruction ([facility]) Active Problems:   CAD (coronary artery disease)   Persistent atrial fibrillation ([facility])   Essential hypertension   Diabetes mellitus ([facility])   GERD (gastroesophageal reflux disease)   Esophageal stricture   SBO (small bowel obstruction) (H[facility])   Hypokalemia   Hypomagnesemia   Bladder mass  #1 acute small bowel obstruction Questionable etiology.  Patient noted to have a prior history of hernia repair x3 in the 1960s.  Abdominal film today with filling defect noted in the bladder similar to that seen on CT suspicious for bladder mass, also consistent with small bowel obstruction.  Patient noted to have large bowel movement and passing gas on 03/13/2018.  NG tube was removed.  Patient started on clears.  Patient noted to have a bout of emesis 03/14/2018, with abdominal distention and concern for recurrent small bowel obstruction and made n.p.o.  Patient states had bowel movements last night and this morning.  Patient denies any abdominal pain.  Abdominal films pending.  Continue IV fluids, bowel rest, supportive care.  General surgery following.   2.  Bladder mass. Patient currently with acute small bowel obstruction.  Bladder mass/bladder tumor noted on CT.  Urology was consulted has assessed the patient and patient noted to have a bladder tumor.  Urology  recommending outpatient follow-up for further evaluation and management with patient's urologist Dr. Diona Fanti.   3.  Diabetes mellitus Blood glucose of 104 this morning.  Follow.  4.  Hypertension Patient placed back on IV enalapril and oral antibiotics discontinued as patient was placed back on bowel rest due to concerns for recurrent small bowel obstruction.  Follow.    5.  Persistent atrial fibrillation CHA2DS2VASC score 5 Rate controlled.  Eliquis on hold secondary to problem #1 in case of procedure as needed.  Continue IV heparin.  Defer resumption of Eliquis to general surgery.  6.  Gastroesophageal reflux disease Continue IV Pepcid.    DVT prophylaxis: Heparin Code Status: Full Family Communication: Updated patient and wife at bedside. Disposition Plan: Likely home once small bowel obstruction is resolved and per general surgery.   Consultants:   General surgery: Dr. Marcello Moores 03/11/2018.  Urology: Dr. Alyson Ingles 03/13/2018  Procedures:   Small bowel follow-through 03/12/2018  CT abdomen and pelvis 03/11/2018  Abdominal film 03/12/2018, 03/11/2018, 03/14/2018, 03/15/2018  Antimicrobials:   None   Subjective: Patient about to sit in a wheelchair to go down for abdominal films.  Patient states had bowel movements last night and this morning.  Denies any nausea or vomiting.  No abdominal pain.    Objective: Vitals:   03/14/18 1336 03/14/18 1950 03/15/18 0549 03/15/18 1350  BP: (!) 155/74 (!) 148/63 135/65 (!) 161/71  Pulse: 92 77 63 70  Resp: 17 17 16 18   Temp: 99 F (37.2 C) 98.5 F (36.9 C) 97.7 F (36.5 C) 98.4 F (36.9 C)  TempSrc: Oral Oral  Oral Oral  SpO2: 93% 92% 93% 98%  Weight:      Height:        Intake/Output Summary (Last 24 hours) at 03/15/2018 1912 Last data filed at 03/15/2018 1800 Gross per 24 hour  Intake 360 ml  Output 0 ml  Net 360 ml   Filed Weights   03/12/18 0500 03/13/18 0500 03/14/18 0500  Weight: 80 kg (176 lb 5.9 oz) 80.5 kg (177 lb  7.5 oz) 78.6 kg (173 lb 4.8 oz)    Examination:  General exam: NAD Respiratory system: Lungs clear to auscultation bilaterally.  No wheezes, no crackles, no rhonchi.  Cardiovascular system: Regular rate rhythm no murmurs rubs or gallops.  No JVD.  No lower extremity edema. Gastrointestinal system: Abdomen is soft, mildly distended, nontender to palpation, positive bowel sounds, no rebound.  No guarding.   Central nervous system: Alert and oriented. No focal neurological deficits. Extremities: Symmetric 5 x 5 power. Skin: No rashes, lesions or ulcers Psychiatry: Judgement and insight appear normal. Mood & affect appropriate.     Data Reviewed: I have personally reviewed following labs and imaging studies  CBC: Recent Labs  Lab 03/12/18 0516 03/13/18 0525 03/14/18 0459 03/14/18 1118 03/15/18 0356  WBC 12.1* 6.4 3.2* 2.5* 6.7  HGB 15.7 15.2 15.0 15.1 12.5*  HCT 46.4 45.4 44.5 44.2 37.6*  MCV 88.7 89.2 87.8 86.5 87.0  PLT 192 178 171 187 242   Basic Metabolic Panel: Recent Labs  Lab 03/11/18 1159 03/11/18 1832 03/12/18 0516 03/13/18 0525 03/14/18 0459 03/15/18 0356  NA 140  --  142 144 142 141  K 4.0  --  4.0 3.9 3.7 3.0*  CL 107  --  109 112* 108 110  CO2 23  --  25 29 26 22   GLUCOSE 157*  --  122* 130* 183* 137*  BUN 13  --  17 25* 17 22*  CREATININE 0.75  --  1.04 0.97 0.84 0.83  CALCIUM 9.0  --  8.8* 8.7* 8.6* 8.0*  MG  --  2.1 1.9 1.9 1.7 1.7   GFR: Estimated Creatinine Clearance: 61.9 mL/min (by C-G formula based on SCr of 0.83 mg/dL). Liver Function Tests: Recent Labs  Lab 03/11/18 1159 03/15/18 0356  AST 23 16  ALT 14* 11*  ALKPHOS 81 42  BILITOT 0.8 1.1  PROT 7.4 5.2*  ALBUMIN 4.2 2.7*   Recent Labs  Lab 03/11/18 1159  LIPASE 23   No results for input(s): AMMONIA in the last 168 hours. Coagulation Profile: Recent Labs  Lab 03/12/18 0516  INR 1.32   Cardiac Enzymes: No results for input(s): CKTOTAL, CKMB, CKMBINDEX, TROPONINI in the  last 168 hours. BNP (last 3 results) No results for input(s): PROBNP in the last 8760 hours. HbA1C: No results for input(s): HGBA1C in the last 72 hours. CBG: Recent Labs  Lab 03/12/18 0742 03/13/18 0835 03/14/18 0758 03/15/18 0751  GLUCAP 123* 131* 189* 104*   Lipid Profile: No results for input(s): CHOL, HDL, LDLCALC, TRIG, CHOLHDL, LDLDIRECT in the last 72 hours. Thyroid Function Tests: No results for input(s): TSH, T4TOTAL, FREET4, T3FREE, THYROIDAB in the last 72 hours. Anemia Panel: No results for input(s): VITAMINB12, FOLATE, FERRITIN, TIBC, IRON, RETICCTPCT in the last 72 hours. Sepsis Labs: No results for input(s): PROCALCITON, LATICACIDVEN in the last 168 hours.  No results found for this or any previous visit (from the past 240 hour(s)).       Radiology Studies: Dg Abd 1 View  Result Date:  03/14/2018 CLINICAL DATA:  Abdominal pain and bloating in a patient with a small bowel obstruction. EXAM: ABDOMEN - 1 VIEW COMPARISON:  Single-view of the abdomen 03/11/2018 and 03/12/2018. FINDINGS: There has been increase in gaseous distention of small bowel with loops measuring up to approximately 5.7 cm seen on today's examination compared to 4.3 cm on the study 2 days ago. There is also gaseous distention of the stomach. NG tube seen on the most recent examination is no longer visualized. Bilateral hip arthroplasties are in place. Asymmetric positioning of the femoral head on the right is consistent with wear of the acetabular lining. IMPRESSION: Worsened appearance of small-bowel obstruction. NG tube seen on yesterday's study is not visualized today and presumably has been removed. Electronically Signed   By: Inge Rise M.D.   On: 03/14/2018 11:46   Dg Abd Acute W/chest  Result Date: 03/15/2018 CLINICAL DATA:  Follow-up small bowel obstruction. EXAM: DG ABDOMEN ACUTE W/ 1V CHEST COMPARISON:  Abdomen x-ray 03/14/2018, 03/12/2018, 03/11/2018. CT abdomen and pelvis 03/11/2018.  CT chest 04/19/2005 and earlier. Chest x-ray 12/06/2004. FINDINGS: Moderate gaseous distention of multiple loops of small bowel throughout the upper abdomen, minimally improved since yesterday. Gas and stool throughout normal caliber colon from cecum to rectum. Small bowel air-fluid levels on the ERECT image. No evidence of free intraperitoneal air. Calcified gallstones in the RIGHT UPPER quadrant as noted on the prior CT. Patchy airspace opacities involving the RIGHT lung base, new since the visualized lung bases on the CT 3 days ago. Minimal atelectasis involving the LEFT lung base. Cardiac silhouette mildly enlarged. Pulmonary vascularity normal. Severe degenerative changes involving the shoulder joints. Degenerative changes involving the thoracic and lumbar spine diffusely. BILATERAL hip prostheses with anatomic alignment in the AP projection. IMPRESSION: 1. Minimal improvement in the partial small bowel obstruction since yesterday, though dilated loops of small bowel persist with associated air-fluid levels. 2. No evidence of free intraperitoneal air. 3. Pneumonia involving the RIGHT lung base. Minimal LEFT basilar atelectasis. 4. Cholelithiasis. Electronically Signed   By: Evangeline Dakin M.D.   On: 03/15/2018 15:15        Scheduled Meds: . bisacodyl  10 mg Rectal Daily  . enalaprilat  1.25 mg Intravenous Q6H  . finasteride  5 mg Oral q morning - 10a  . lip balm  1 application Topical BID  . potassium chloride  20 mEq Oral Once  . senna  1 tablet Oral BID  . sodium chloride flush  3 mL Intravenous Q12H  . sodium chloride flush  3 mL Intravenous Q12H   Continuous Infusions: . sodium chloride 100 mL/hr at 03/15/18 0841  . sodium chloride    . famotidine (PEPCID) IV Stopped (03/15/18 1029)  . heparin 1,350 Units/hr (03/15/18 1002)  . methocarbamol (ROBAXIN)  IV    . ondansetron (ZOFRAN) IV       LOS: 4 days    Time spent: 35 minutes    Irine Seal, MD [facility] Pager [phone]  If 7PM-7AM, please contact night-coverage [email or web address] Password [facility] 03/15/2018, 7:12 PM

## 2018-03-15 NOTE — Progress Notes (Signed)
Per pharmacist instruction, iv heparin dose has been changed from 12.34ml/hr to 13.80ml/hr.

## 2018-03-15 NOTE — Progress Notes (Signed)
PT Cancellation Note  Patient Details Name: Jonathan [name] MRN: [id number] DOB: November 14, 1930   Cancelled Treatment:    Reason Eval/Treat Not Completed: PT screened, no needs identified, will sign off(pt was observed ambulating 2400' in hall with his wife, no loss of balance. They don't feel he needs PT nor DME. Will sign off. )   Philomena Doheny 03/15/2018, 11:02 AM [phone]

## 2018-03-16 ENCOUNTER — Inpatient Hospital Stay (HOSPITAL_COMMUNITY): Payer: Medicare Other

## 2018-03-16 DIAGNOSIS — N3289 Other specified disorders of bladder: Secondary | ICD-10-CM

## 2018-03-16 DIAGNOSIS — E876 Hypokalemia: Secondary | ICD-10-CM

## 2018-03-16 DIAGNOSIS — E8770 Fluid overload, unspecified: Secondary | ICD-10-CM

## 2018-03-16 DIAGNOSIS — J690 Pneumonitis due to inhalation of food and vomit: Secondary | ICD-10-CM

## 2018-03-16 LAB — BASIC METABOLIC PANEL
ANION GAP: 6 (ref 5–15)
BUN: 18 mg/dL (ref 6–20)
CO2: 22 mmol/L (ref 22–32)
Calcium: 7.9 mg/dL — ABNORMAL LOW (ref 8.9–10.3)
Chloride: 114 mmol/L — ABNORMAL HIGH (ref 101–111)
Creatinine, Ser: 0.77 mg/dL (ref 0.61–1.24)
GLUCOSE: 113 mg/dL — AB (ref 65–99)
POTASSIUM: 3.5 mmol/L (ref 3.5–5.1)
Sodium: 142 mmol/L (ref 135–145)

## 2018-03-16 LAB — CBC
HEMATOCRIT: 36.6 % — AB (ref 39.0–52.0)
HEMOGLOBIN: 12 g/dL — AB (ref 13.0–17.0)
MCH: 28.9 pg (ref 26.0–34.0)
MCHC: 32.8 g/dL (ref 30.0–36.0)
MCV: 88.2 fL (ref 78.0–100.0)
Platelets: 151 10*3/uL (ref 150–400)
RBC: 4.15 MIL/uL — ABNORMAL LOW (ref 4.22–5.81)
RDW: 14.1 % (ref 11.5–15.5)
WBC: 8 10*3/uL (ref 4.0–10.5)

## 2018-03-16 LAB — HEPARIN LEVEL (UNFRACTIONATED): Heparin Unfractionated: 0.42 IU/mL (ref 0.30–0.70)

## 2018-03-16 LAB — GLUCOSE, CAPILLARY: Glucose-Capillary: 87 mg/dL (ref 65–99)

## 2018-03-16 LAB — MAGNESIUM: Magnesium: 2.2 mg/dL (ref 1.7–2.4)

## 2018-03-16 MED ORDER — FUROSEMIDE 10 MG/ML IJ SOLN: 20.0000 mg | Freq: Once | INTRAMUSCULAR | Status: DC

## 2018-03-16 MED ORDER — PIPERACILLIN-TAZOBACTAM 3.375 G IVPB
3.3750 g | Freq: Three times a day (TID) | INTRAVENOUS | Status: DC
  Administered 2018-03-16 – 2018-03-17 (×3): 3.375 g via INTRAVENOUS
  Filled 2018-03-16 (×3): qty 50

## 2018-03-16 MED ORDER — DIPHENHYDRAMINE HCL 50 MG/ML IJ SOLN: 12.5000 mg | Freq: Four times a day (QID) | INTRAMUSCULAR | Status: DC | PRN

## 2018-03-16 MED ORDER — APIXABAN 5 MG PO TABS
5.0000 mg | ORAL_TABLET | Freq: Two times a day (BID) | ORAL | Status: DC
  Administered 2018-03-16 – 2018-03-17 (×2): 5 mg via ORAL
  Filled 2018-03-16 (×2): qty 1

## 2018-03-16 MED ORDER — FUROSEMIDE 10 MG/ML IJ SOLN
20.0000 mg | Freq: Two times a day (BID) | INTRAMUSCULAR | Status: DC
  Administered 2018-03-16 – 2018-03-17 (×3): 20 mg via INTRAVENOUS
  Filled 2018-03-16 (×3): qty 2

## 2018-03-16 MED ORDER — SIMETHICONE 80 MG PO CHEW
160.0000 mg | CHEWABLE_TABLET | Freq: Four times a day (QID) | ORAL | Status: DC
  Administered 2018-03-16 – 2018-03-17 (×3): 160 mg via ORAL
  Filled 2018-03-16 (×3): qty 2

## 2018-03-16 MED ORDER — PIPERACILLIN-TAZOBACTAM 3.375 G IVPB 30 MIN
3.3750 g | Freq: Once | INTRAVENOUS | Status: AC
  Administered 2018-03-16: 3.375 g via INTRAVENOUS
  Filled 2018-03-16 (×2): qty 50

## 2018-03-16 MED ORDER — POTASSIUM CHLORIDE 20 MEQ PO PACK
40.0000 meq | PACK | Freq: Once | ORAL | Status: DC
Start: 2018-03-16 — End: 2018-03-17
  Filled 2018-03-16 (×2): qty 2

## 2018-03-16 NOTE — Progress Notes (Signed)
ANTICOAGULATION CONSULT NOTE - Follow Up Consult  Pharmacy Consult for heparin to home apixaban Indication: hx atrial fibrillation   No Known Allergies  Patient Measurements: Height: 5\' 7"  (170.2 cm) Weight: 177 lb 14.6 oz (80.7 kg)(no blanket) IBW/kg (Calculated) : 66.1 Heparin Dosing Weight: 79 kg  Vital Signs: Temp: 98.5 F (36.9 C) (06/03 1430) Temp Source: Oral (06/03 1430) BP: 148/72 (06/03 1430) Pulse Rate: 56 (06/03 1430)  Labs: Recent Labs    03/14/18 0459 03/14/18 1118 03/15/18 0356 03/15/18 1808 03/16/18 0423  HGB 15.0 15.1 12.5*  --  12.0*  HCT 44.5 44.2 37.6*  --  36.6*  PLT 171 187 173  --  151  APTT 68*  --  50* 85*  --   HEPARINUNFRC 0.91*  --  0.66 0.66 0.42  CREATININE 0.84  --  0.83  --  0.77    Estimated Creatinine Clearance: 69.9 mL/min (by C-G formula based on SCr of 0.77 mg/dL).   Medications:  Eliquis 5 mg bid PTA  Assessment: Patient's an 84 y.o M with hx Afib on eliquis PTA, presented to the ED on 5/29 with c/o abdominal pain. Abd CTA showed SBO. Patient was transitioned to heparin drip on admission.  OK to resume apixaban 6/3 PM per surgery, consult placed by hospitalist  Today, 03/16/2018: - aPTT and HL correlated yesterday, so will continue monitoring heparin with only HL - Heparin level 0.42 units/ml today, in therapeutic range, no problems noted with infusion - CBC low-stable, Plt wnl at 151 - Advancing diet to full liquids.  PM update:  Surgery states OK to resume apixaban  Goal of Therapy:  Heparin level 0.3-0.7 units/ml aPTT 66-102 seconds Monitor platelets by anticoagulation protocol: Yes   Plan:   Resume apixaban 5mg  PO BID  Stop heparin gtt when give 1st dose of apixaban  Current RN asks for time for conversion be on oncoming shift.  Instructed RN how to transition (stop heparin, give apixaban dose)  D/C heparin labs  Doreene Eland, PharmD, BCPS.   Pager: [phone] 03/16/2018 6:11 PM

## 2018-03-16 NOTE — Progress Notes (Signed)
PROGRESS NOTE    Jonathan [name]  ZJQ:[id number] DOB: May 26, 1931 DOA: 03/11/2018 PCP: Josetta Huddle, MD   Brief Narrative:  Patient is a 84 year old gentleman history of A. fib on chronic anticoagulation with Eliquis, coronary artery disease, type 2 diabetes, history of esophageal strictures status post multiple dilations, history of hernia repair x3 in the 1960s presented with nausea abdominal pain and noted on CT abdomen and pelvis to have an acute small bowel obstruction.  General surgery consulted.  NG tube placed.  Small bowel protocol followed through being done.   Assessment & Plan:   Principal Problem:   Small bowel obstruction (HCC) Active Problems:   CAD (coronary artery disease)   Persistent atrial fibrillation (HCC)   Essential hypertension   Diabetes mellitus (HCC)   GERD (gastroesophageal reflux disease)   Esophageal stricture   SBO (small bowel obstruction) (HCC)   Hypokalemia   Hypomagnesemia   Bladder mass   Aspiration pneumonia due to gastric secretions (HCC)   Hypervolemia  #1 acute small bowel obstruction Questionable etiology.  Patient noted to have a prior history of hernia repair x3 in the 1960s.  Abdominal film today with filling defect noted in the bladder similar to that seen on CT suspicious for bladder mass, also consistent with small bowel obstruction.  Patient noted to have large bowel movement and passing gas on 03/13/2018.  NG tube was removed.  Patient started on clears.  Patient noted to have a bout of emesis 03/14/2018, with abdominal distention and concern for recurrent small bowel obstruction and made n.p.o.  Patient states had bowel movements last night and this morning.  Patient denies any abdominal pain.  Abdominal films with improvement.  Patient with bowel movements.  Patient was started on clears and diet has been advanced to a full liquid diet per general surgery.  Saline lock IV fluids.  Supportive care.  General surgery following.  2.  Volume  overload Patient noted to be volume overloaded on exam with some diffuse crackles.  Urine output not accurately recorded.  Current weight is 177 pounds.  Strict I's and O's.  Daily weights.  Saline lock IV fluids.  Place on Lasix 20 mg IV every 12 hours.  Follow.  3.  Probable aspiration pneumonia Noted on chest x-ray.  Patient had, small bowel obstruction had some episodes of emesis.  Patient afebrile.  Normal white count.  Check blood cultures x2.  Check a sputum Gram stain and culture.  Place empirically on IV Zosyn.  Place on nebulizer treatments.  Follow.  4.  Bladder mass. Patient noted with an acute small bowel obstruction.  Bladder mass/bladder tumor noted on CT.  Urology was consulted assessed the patient and patient noted to have a bladder tumor.  Urology recommending outpatient follow-up for further evaluation and management with patient's urologist Dr. Diona Fanti.   5.  Diabetes mellitus CBGs this morning was 87.  Patient on clear liquids.  Diet has been advanced to full liquids.  Follow.  6.  Hypertension Patient placed back on IV enalapril and oral antibiotics discontinued as patient was placed back on bowel rest due to concerns for recurrent small bowel obstruction.  Patient currently on clears.  Continue IV enalapril for now.  Follow.    7.  Persistent atrial fibrillation CHA2DS2VASC score 5 Rate is currently controlled.  Patient was on IV heparin secondary to problem 1 in case procedure was needed.  Per general surgery okay to resume Eliquis.  Discontinue IV heparin.  Resume home dose Eliquis.  8.  Gastroesophageal reflux disease Continue IV Pepcid.    DVT prophylaxis: Heparin>>> Eliquis Code Status: Full Family Communication: Updated patient and wife at bedside. Disposition Plan: Likely home once small bowel obstruction is resolved and per general surgery.   Consultants:   General surgery: Dr. Marcello Moores 03/11/2018.  Urology: Dr. Alyson Ingles 03/13/2018  Procedures:    Small bowel follow-through 03/12/2018  CT abdomen and pelvis 03/11/2018  Abdominal film 03/12/2018, 03/11/2018, 03/14/2018, 03/15/2018  Antimicrobials:   IV Zosyn 03/16/2018   Subjective: Patient laying in bed.  Denies any abdominal pain.  States having bowel movements.  No chest pain.  No shortness of breath.  Patient tolerating clears.  Objective: Vitals:   03/16/18 0604 03/16/18 0634 03/16/18 0639 03/16/18 1430  BP: (!) 127/56   (!) 148/72  Pulse: (!) 55   (!) 56  Resp: 18   (!) 24  Temp: (!) 97.4 F (36.3 C)   98.5 F (36.9 C)  TempSrc: Oral   Oral  SpO2: 94%   95%  Weight:  82.4 kg (181 lb 10.5 oz) 80.7 kg (177 lb 14.6 oz)   Height:        Intake/Output Summary (Last 24 hours) at 03/16/2018 1828 Last data filed at 03/16/2018 1801 Gross per 24 hour  Intake 5416.28 ml  Output 700 ml  Net 4716.28 ml   Filed Weights   03/14/18 0500 03/16/18 0634 03/16/18 0639  Weight: 78.6 kg (173 lb 4.8 oz) 82.4 kg (181 lb 10.5 oz) 80.7 kg (177 lb 14.6 oz)    Examination:  General exam: NAD Respiratory system: Diffuse scattered crackles. No rhonchi. No wheezing. Nl respiratory effort.  Cardiovascular system: RRR no m/r/g. No LE edema. Gastrointestinal system: Abdomen is mildly distended, soft, nontender to palpation, positive bowel sounds, no rebound.  No guarding.   Central nervous system: Alert and oriented. No focal neurological deficits. Extremities: Symmetric 5 x 5 power. Skin: No rashes, lesions or ulcers Psychiatry: Judgement and insight appear normal. Mood & affect appropriate.     Data Reviewed: I have personally reviewed following labs and imaging studies  CBC: Recent Labs  Lab 03/13/18 0525 03/14/18 0459 03/14/18 1118 03/15/18 0356 03/16/18 0423  WBC 6.4 3.2* 2.5* 6.7 8.0  HGB 15.2 15.0 15.1 12.5* 12.0*  HCT 45.4 44.5 44.2 37.6* 36.6*  MCV 89.2 87.8 86.5 87.0 88.2  PLT 178 171 187 173 712   Basic Metabolic Panel: Recent Labs  Lab 03/12/18 0516 03/13/18 0525  03/14/18 0459 03/15/18 0356 03/16/18 0423  NA 142 144 142 141 142  K 4.0 3.9 3.7 3.0* 3.5  CL 109 112* 108 110 114*  CO2 25 29 26 22 22   GLUCOSE 122* 130* 183* 137* 113*  BUN 17 25* 17 22* 18  CREATININE 1.04 0.97 0.84 0.83 0.77  CALCIUM 8.8* 8.7* 8.6* 8.0* 7.9*  MG 1.9 1.9 1.7 1.7 2.2   GFR: Estimated Creatinine Clearance: 69.9 mL/min (by C-G formula based on SCr of 0.77 mg/dL). Liver Function Tests: Recent Labs  Lab 03/11/18 1159 03/15/18 0356  AST 23 16  ALT 14* 11*  ALKPHOS 81 42  BILITOT 0.8 1.1  PROT 7.4 5.2*  ALBUMIN 4.2 2.7*   Recent Labs  Lab 03/11/18 1159  LIPASE 23   No results for input(s): AMMONIA in the last 168 hours. Coagulation Profile: Recent Labs  Lab 03/12/18 0516  INR 1.32   Cardiac Enzymes: No results for input(s): CKTOTAL, CKMB, CKMBINDEX, TROPONINI in the last 168 hours. BNP (last 3 results) No  results for input(s): PROBNP in the last 8760 hours. HbA1C: No results for input(s): HGBA1C in the last 72 hours. CBG: Recent Labs  Lab 03/12/18 0742 03/13/18 0835 03/14/18 0758 03/15/18 0751 03/16/18 0742  GLUCAP 123* 131* 189* 104* 87   Lipid Profile: No results for input(s): CHOL, HDL, LDLCALC, TRIG, CHOLHDL, LDLDIRECT in the last 72 hours. Thyroid Function Tests: No results for input(s): TSH, T4TOTAL, FREET4, T3FREE, THYROIDAB in the last 72 hours. Anemia Panel: No results for input(s): VITAMINB12, FOLATE, FERRITIN, TIBC, IRON, RETICCTPCT in the last 72 hours. Sepsis Labs: No results for input(s): PROCALCITON, LATICACIDVEN in the last 168 hours.  No results found for this or any previous visit (from the past 240 hour(s)).       Radiology Studies: Dg Abd 2 Views  Result Date: 03/16/2018 CLINICAL DATA:  Follow up small bowel obstruction EXAM: ABDOMEN - 2 VIEW COMPARISON:  03/15/2018 FINDINGS: Scattered large and small bowel gas is noted. The degree of small bowel dilatation has improved in the interval from the prior exam. No  free air is seen. Degenerative changes of lumbar spine are noted. Postsurgical changes of the hips are seen. IMPRESSION: Improvement and small-bowel dilatation when compare with the prior exam. A single loop of minimally dilated small bowel remains. Electronically Signed   By: Inez Catalina M.D.   On: 03/16/2018 08:14   Dg Abd Acute W/chest  Result Date: 03/15/2018 CLINICAL DATA:  Follow-up small bowel obstruction. EXAM: DG ABDOMEN ACUTE W/ 1V CHEST COMPARISON:  Abdomen x-ray 03/14/2018, 03/12/2018, 03/11/2018. CT abdomen and pelvis 03/11/2018. CT chest 04/19/2005 and earlier. Chest x-ray 12/06/2004. FINDINGS: Moderate gaseous distention of multiple loops of small bowel throughout the upper abdomen, minimally improved since yesterday. Gas and stool throughout normal caliber colon from cecum to rectum. Small bowel air-fluid levels on the ERECT image. No evidence of free intraperitoneal air. Calcified gallstones in the RIGHT UPPER quadrant as noted on the prior CT. Patchy airspace opacities involving the RIGHT lung base, new since the visualized lung bases on the CT 3 days ago. Minimal atelectasis involving the LEFT lung base. Cardiac silhouette mildly enlarged. Pulmonary vascularity normal. Severe degenerative changes involving the shoulder joints. Degenerative changes involving the thoracic and lumbar spine diffusely. BILATERAL hip prostheses with anatomic alignment in the AP projection. IMPRESSION: 1. Minimal improvement in the partial small bowel obstruction since yesterday, though dilated loops of small bowel persist with associated air-fluid levels. 2. No evidence of free intraperitoneal air. 3. Pneumonia involving the RIGHT lung base. Minimal LEFT basilar atelectasis. 4. Cholelithiasis. Electronically Signed   By: Evangeline Dakin M.D.   On: 03/15/2018 15:15        Scheduled Meds: . apixaban  5 mg Oral BID  . bisacodyl  10 mg Rectal Daily  . enalaprilat  1.25 mg Intravenous Q6H  . finasteride  5 mg  Oral q morning - 10a  . furosemide  20 mg Intravenous Q12H  . lip balm  1 application Topical BID  . potassium chloride  40 mEq Oral Once  . potassium chloride  20 mEq Oral Once  . senna  1 tablet Oral BID  . simethicone  160 mg Oral QID  . sodium chloride flush  3 mL Intravenous Q12H  . sodium chloride flush  3 mL Intravenous Q12H   Continuous Infusions: . sodium chloride    . famotidine (PEPCID) IV Stopped (03/16/18 1138)  . heparin 1,350 Units/hr (03/16/18 0328)  . methocarbamol (ROBAXIN)  IV    . ondansetron (  ZOFRAN) IV    . piperacillin-tazobactam (ZOSYN)  IV       LOS: 5 days    Time spent: 35 minutes    Irine Seal, MD [facility] Pager [phone]  If 7PM-7AM, please contact night-coverage [email or web address] Password [facility] 03/16/2018, 6:28 PM

## 2018-03-16 NOTE — Progress Notes (Signed)
ANTICOAGULATION CONSULT NOTE - Follow Up Consult  Pharmacy Consult for heparin Indication: hx atrial fibrillation (home Eliquis on hold)  No Known Allergies  Patient Measurements: Height: 5\' 7"  (170.2 cm) Weight: 177 lb 14.6 oz (80.7 kg)(no blanket) IBW/kg (Calculated) : 66.1 Heparin Dosing Weight: 79 kg  Vital Signs: Temp: 97.4 F (36.3 C) (06/03 0604) Temp Source: Oral (06/03 0604) BP: 127/56 (06/03 0604) Pulse Rate: 55 (06/03 0604)  Labs: Recent Labs    03/14/18 0459 03/14/18 1118 03/15/18 0356 03/15/18 1808 03/16/18 0423  HGB 15.0 15.1 12.5*  --  12.0*  HCT 44.5 44.2 37.6*  --  36.6*  PLT 171 187 173  --  151  APTT 68*  --  50* 85*  --   HEPARINUNFRC 0.91*  --  0.66 0.66 0.42  CREATININE 0.84  --  0.83  --  0.77    Estimated Creatinine Clearance: 69.9 mL/min (by C-G formula based on SCr of 0.77 mg/dL).   Medications:  Eliquis 5 mg bid PTA  Assessment: Patient's an 84 y.o M with hx Afib on eliquis PTA, presented to the ED on 5/29 with c/o abdominal pain. Abd CTA showed SBO. Patient was transitioned to heparin drip on admission.  Today, 03/16/2018: - aPTT and HL correlated yesterday, so will continue monitoring heparin with only HL - Heparin level 0.42 units/ml today, in therapeutic range, no problems noted with infusion - CBC low-stable, Plt wnl at 151 - Advancing diet to full liquids  Goal of Therapy:  Heparin level 0.3-0.7 units/ml aPTT 66-102 seconds Monitor platelets by anticoagulation protocol: Yes   Plan:   Continue Heparin at 1350 units/hr  Anticipate transition back to Eliquis once tolerating full liq or regular diet  Daily Heparin level  Minda Ditto PharmD Pager [phone] 03/16/2018, 10:28 AM

## 2018-03-16 NOTE — Progress Notes (Signed)
[facility] Progress Note     Subjective: CC- SBO Sitting up in chair, son at bedside. No complaints this morning. Denies any abdominal pain, nausea, vomiting, or bloating. Patient had multiple loose BMs yesterday and is passing flatus. Tolerating clears. States that he is hungry.  Objective: Vital signs in last 24 hours: Temp:  [97.4 F (36.3 C)-98.4 F (36.9 C)] 97.4 F (36.3 C) (06/03 0604) Pulse Rate:  [55-70] 55 (06/03 0604) Resp:  [18] 18 (06/03 0604) BP: (127-161)/(56-77) 127/56 (06/03 0604) SpO2:  [94 %-98 %] 94 % (06/03 0604) Weight:  [177 lb 14.6 oz (80.7 kg)-181 lb 10.5 oz (82.4 kg)] 177 lb 14.6 oz (80.7 kg) (06/03 0639) Last BM Date: 03/13/18  Intake/Output from previous day: 06/02 0701 - 06/03 0700 In: 5126.3 [P.O.:960; I.V.:4166.3] Out: 0  Intake/Output this shift: No intake/output data recorded.  PE: Gen:  Alert, NAD, pleasant HEENT: EOM's intact, pupils equal and round Pulm:  CTAB, no W/R/R, effort normal Abd: Soft, ND, NT, +BS, no HSM, no hernia Ext:  Calves soft and nontender Skin: warm and dry  Lab Results:  Recent Labs    03/15/18 0356 03/16/18 0423  WBC 6.7 8.0  HGB 12.5* 12.0*  HCT 37.6* 36.6*  PLT 173 151   BMET Recent Labs    03/15/18 0356 03/16/18 0423  NA 141 142  K 3.0* 3.5  CL 110 114*  CO2 22 22  GLUCOSE 137* 113*  BUN 22* 18  CREATININE 0.83 0.77  CALCIUM 8.0* 7.9*   PT/INR No results for input(s): LABPROT, INR in the last 72 hours. CMP     Component Value Date/Time   NA 142 03/16/2018 0423   NA 142 06/23/2017 1210   K 3.5 03/16/2018 0423   CL 114 (H) 03/16/2018 0423   CO2 22 03/16/2018 0423   GLUCOSE 113 (H) 03/16/2018 0423   BUN 18 03/16/2018 0423   BUN 14 06/23/2017 1210   CREATININE 0.77 03/16/2018 0423   CALCIUM 7.9 (L) 03/16/2018 0423   PROT 5.2 (L) 03/15/2018 0356   ALBUMIN 2.7 (L) 03/15/2018 0356   AST 16 03/15/2018 0356   ALT 11 (L) 03/15/2018 0356   ALKPHOS 42 03/15/2018 0356    BILITOT 1.1 03/15/2018 0356   GFRNONAA >60 03/16/2018 0423   GFRAA >60 03/16/2018 0423   Lipase     Component Value Date/Time   LIPASE 23 03/11/2018 1159       Studies/Results: Dg Abd 1 View  Result Date: 03/14/2018 CLINICAL DATA:  Abdominal pain and bloating in a patient with a small bowel obstruction. EXAM: ABDOMEN - 1 VIEW COMPARISON:  Single-view of the abdomen 03/11/2018 and 03/12/2018. FINDINGS: There has been increase in gaseous distention of small bowel with loops measuring up to approximately 5.7 cm seen on today's examination compared to 4.3 cm on the study 2 days ago. There is also gaseous distention of the stomach. NG tube seen on the most recent examination is no longer visualized. Bilateral hip arthroplasties are in place. Asymmetric positioning of the femoral head on the right is consistent with wear of the acetabular lining. IMPRESSION: Worsened appearance of small-bowel obstruction. NG tube seen on yesterday's study is not visualized today and presumably has been removed. Electronically Signed   By: Inge Rise M.D.   On: 03/14/2018 11:46   Dg Abd Acute W/chest  Result Date: 03/15/2018 CLINICAL DATA:  Follow-up small bowel obstruction. EXAM: DG ABDOMEN ACUTE W/ 1V CHEST COMPARISON:  Abdomen x-ray 03/14/2018, 03/12/2018, 03/11/2018. CT  abdomen and pelvis 03/11/2018. CT chest 04/19/2005 and earlier. Chest x-ray 12/06/2004. FINDINGS: Moderate gaseous distention of multiple loops of small bowel throughout the upper abdomen, minimally improved since yesterday. Gas and stool throughout normal caliber colon from cecum to rectum. Small bowel air-fluid levels on the ERECT image. No evidence of free intraperitoneal air. Calcified gallstones in the RIGHT UPPER quadrant as noted on the prior CT. Patchy airspace opacities involving the RIGHT lung base, new since the visualized lung bases on the CT 3 days ago. Minimal atelectasis involving the LEFT lung base. Cardiac silhouette mildly  enlarged. Pulmonary vascularity normal. Severe degenerative changes involving the shoulder joints. Degenerative changes involving the thoracic and lumbar spine diffusely. BILATERAL hip prostheses with anatomic alignment in the AP projection. IMPRESSION: 1. Minimal improvement in the partial small bowel obstruction since yesterday, though dilated loops of small bowel persist with associated air-fluid levels. 2. No evidence of free intraperitoneal air. 3. Pneumonia involving the RIGHT lung base. Minimal LEFT basilar atelectasis. 4. Cholelithiasis. Electronically Signed   By: Evangeline Dakin M.D.   On: 03/15/2018 15:15    Anti-infectives: Anti-infectives (From admission, onward)   None       Assessment/Plan Bladder mass DM HTN A fib - on eliquis GERD  Partial SBO - xray improved today, tolerating clears, having bowel function - advance to full liquids. Continue ambulating. Ok to restart eliquis from our standpoint. Hopefully start solid food tomorrow and discharge home.  ID - none FEN - full liquids VTE - SCDs, heparin   LOS: 5 days    Wellington Hampshire , [facility] 03/16/2018, 7:49 AM Pager: [phone] Consults: [phone] Mon 7:00 am -11:30 AM Tues-Fri 7:00 am-4:30 pm Sat-Sun 7:00 am-11:30 am

## 2018-03-16 NOTE — Progress Notes (Signed)
Pharmacy Antibiotic Note  Jonathan [name] is a 84 y.o. male admitted on 03/11/2018 with SBO.  Pharmacy has been consulted 6/3 for Zosyn dosing for aspiration PNA  Plan: Zosyn 3.375g IV q8h (4 hour infusion).  Height: 5\' 7"  (170.2 cm) Weight: 177 lb 14.6 oz (80.7 kg)(no blanket) IBW/kg (Calculated) : 66.1  Temp (24hrs), Avg:97.7 F (36.5 C), Min:97.4 F (36.3 C), Max:98 F (36.7 C)  Recent Labs  Lab 03/12/18 0516 03/13/18 0525 03/14/18 0459 03/14/18 1118 03/15/18 0356 03/16/18 0423  WBC 12.1* 6.4 3.2* 2.5* 6.7 8.0  CREATININE 1.04 0.97 0.84  --  0.83 0.77    Estimated Creatinine Clearance: 69.9 mL/min (by C-G formula based on SCr of 0.77 mg/dL).    No Known Allergies  Antimicrobials this admission: 6/3 Zosyn >>   Microbiology results: BCx: ordered Sputum: ordered  Strep pneumo Ag: ordered  Thank you for allowing pharmacy to be a part of this patient's care.  Minda Ditto PharmD Pager [phone] 03/16/2018, 2:03 PM

## 2018-03-17 DIAGNOSIS — E118 Type 2 diabetes mellitus with unspecified complications: Secondary | ICD-10-CM

## 2018-03-17 LAB — BASIC METABOLIC PANEL
Anion gap: 11 (ref 5–15)
BUN: 12 mg/dL (ref 6–20)
CALCIUM: 8.5 mg/dL — AB (ref 8.9–10.3)
CHLORIDE: 108 mmol/L (ref 101–111)
CO2: 22 mmol/L (ref 22–32)
Creatinine, Ser: 0.9 mg/dL (ref 0.61–1.24)
GFR calc non Af Amer: >60 mL/min (ref >60)
Glucose, Bld: 125 mg/dL — ABNORMAL HIGH (ref 65–99)
Potassium: 3.2 mmol/L — ABNORMAL LOW (ref 3.5–5.1)
SODIUM: 141 mmol/L (ref 135–145)

## 2018-03-17 LAB — HIV ANTIBODY (ROUTINE TESTING W REFLEX): HIV SCREEN 4TH GENERATION: NONREACTIVE

## 2018-03-17 LAB — GLUCOSE, CAPILLARY: Glucose-Capillary: 108 mg/dL — ABNORMAL HIGH (ref 65–99)

## 2018-03-17 LAB — CBC
HCT: 40.2 % (ref 39.0–52.0)
HEMOGLOBIN: 13.4 g/dL (ref 13.0–17.0)
MCH: 29.4 pg (ref 26.0–34.0)
MCHC: 33.3 g/dL (ref 30.0–36.0)
MCV: 88.2 fL (ref 78.0–100.0)
PLATELETS: 164 10*3/uL (ref 150–400)
RBC: 4.56 MIL/uL (ref 4.22–5.81)
RDW: 14 % (ref 11.5–15.5)
WBC: 9.7 10*3/uL (ref 4.0–10.5)

## 2018-03-17 LAB — MAGNESIUM: MAGNESIUM: 1.8 mg/dL (ref 1.7–2.4)

## 2018-03-17 MED ORDER — AMOXICILLIN-POT CLAVULANATE 875-125 MG PO TABS: 1.0000 | ORAL_TABLET | Freq: Two times a day (BID) | ORAL | 0 refills | Status: AC

## 2018-03-17 MED ORDER — AMLODIPINE BESYLATE 5 MG PO TABS
5.0000 mg | ORAL_TABLET | Freq: Every day | ORAL | Status: DC
  Administered 2018-03-17: 5 mg via ORAL
  Filled 2018-03-17: qty 1

## 2018-03-17 MED ORDER — SIMETHICONE 80 MG PO CHEW: 160.0000 mg | CHEWABLE_TABLET | Freq: Four times a day (QID) | ORAL | 0 refills | Status: DC | PRN

## 2018-03-17 MED ORDER — POTASSIUM CHLORIDE CRYS ER 20 MEQ PO TBCR
40.0000 meq | EXTENDED_RELEASE_TABLET | ORAL | Status: AC
  Administered 2018-03-17 (×2): 40 meq via ORAL
  Filled 2018-03-17 (×2): qty 2

## 2018-03-17 MED ORDER — ALBUTEROL SULFATE HFA 108 (90 BASE) MCG/ACT IN AERS: 2.0000 | INHALATION_SPRAY | Freq: Four times a day (QID) | RESPIRATORY_TRACT | 0 refills | Status: DC | PRN

## 2018-03-17 MED ORDER — PANTOPRAZOLE SODIUM 40 MG PO TBEC
40.0000 mg | DELAYED_RELEASE_TABLET | Freq: Every day | ORAL | Status: DC
  Administered 2018-03-17: 40 mg via ORAL
  Filled 2018-03-17: qty 1

## 2018-03-17 MED ORDER — ISOSORBIDE MONONITRATE ER 30 MG PO TB24
30.0000 mg | ORAL_TABLET | Freq: Every day | ORAL | Status: DC
  Administered 2018-03-17: 30 mg via ORAL
  Filled 2018-03-17: qty 1

## 2018-03-17 NOTE — Progress Notes (Signed)
I have reviewed the pt's CT images. 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 Will see in office for cysto. Have left d/c instructions for pt to call for OV

## 2018-03-17 NOTE — Progress Notes (Signed)
[facility] Progress Note     Subjective: CC- SBO Wife at bedside. No complaints. Denies abdominal pain, n/v. Tolerating fulls. Passing flatus and had a BM yesterday. Started on zosyn for pneumonia yesterday.  Objective: Vital signs in last 24 hours: Temp:  [97.9 F (36.6 C)-98.5 F (36.9 C)] 97.9 F (36.6 C) (06/04 0544) Pulse Rate:  [50-61] 50 (06/04 0544) Resp:  [17-24] 20 (06/04 0544) BP: (137-160)/(72-76) 160/74 (06/04 0544) SpO2:  [93 %-96 %] 93 % (06/04 0544) Weight:  [177 lb 0.5 oz (80.3 kg)] 177 lb 0.5 oz (80.3 kg) (06/04 0544) Last BM Date: 03/16/18  Intake/Output from previous day: 06/03 0701 - 06/04 0700 In: 962.5 [P.O.:570; I.V.:92.5; IV Piggyback:300] Out: 2100 [Urine:2100] Intake/Output this shift: No intake/output data recorded.  PE: Gen:  Alert, NAD, pleasant HEENT: EOM's intact, pupils equal and round Pulm:  CTAB, no W/R/R, effort normal Abd: Soft, ND, NT, +BS, no HSM, no hernia Ext:  Calves soft and nontender Skin: warm and dry  Lab Results:  Recent Labs    03/16/18 0423 03/17/18 0427  WBC 8.0 9.7  HGB 12.0* 13.4  HCT 36.6* 40.2  PLT 151 164   BMET Recent Labs    03/16/18 0423 03/17/18 0427  NA 142 141  K 3.5 3.2*  CL 114* 108  CO2 22 22  GLUCOSE 113* 125*  BUN 18 12  CREATININE 0.77 0.90  CALCIUM 7.9* 8.5*   PT/INR No results for input(s): LABPROT, INR in the last 72 hours. CMP     Component Value Date/Time   NA 141 03/17/2018 0427   NA 142 06/23/2017 1210   K 3.2 (L) 03/17/2018 0427   CL 108 03/17/2018 0427   CO2 22 03/17/2018 0427   GLUCOSE 125 (H) 03/17/2018 0427   BUN 12 03/17/2018 0427   BUN 14 06/23/2017 1210   CREATININE 0.90 03/17/2018 0427   CALCIUM 8.5 (L) 03/17/2018 0427   PROT 5.2 (L) 03/15/2018 0356   ALBUMIN 2.7 (L) 03/15/2018 0356   AST 16 03/15/2018 0356   ALT 11 (L) 03/15/2018 0356   ALKPHOS 42 03/15/2018 0356   BILITOT 1.1 03/15/2018 0356   GFRNONAA >60 03/17/2018 0427   GFRAA >60  03/17/2018 0427   Lipase     Component Value Date/Time   LIPASE 23 03/11/2018 1159       Studies/Results: Dg Abd 2 Views  Result Date: 03/16/2018 CLINICAL DATA:  Follow up small bowel obstruction EXAM: ABDOMEN - 2 VIEW COMPARISON:  03/15/2018 FINDINGS: Scattered large and small bowel gas is noted. The degree of small bowel dilatation has improved in the interval from the prior exam. No free air is seen. Degenerative changes of lumbar spine are noted. Postsurgical changes of the hips are seen. IMPRESSION: Improvement and small-bowel dilatation when compare with the prior exam. A single loop of minimally dilated small bowel remains. Electronically Signed   By: Inez Catalina M.D.   On: 03/16/2018 08:14   Dg Abd Acute W/chest  Result Date: 03/15/2018 CLINICAL DATA:  Follow-up small bowel obstruction. EXAM: DG ABDOMEN ACUTE W/ 1V CHEST COMPARISON:  Abdomen x-ray 03/14/2018, 03/12/2018, 03/11/2018. CT abdomen and pelvis 03/11/2018. CT chest 04/19/2005 and earlier. Chest x-ray 12/06/2004. FINDINGS: Moderate gaseous distention of multiple loops of small bowel throughout the upper abdomen, minimally improved since yesterday. Gas and stool throughout normal caliber colon from cecum to rectum. Small bowel air-fluid levels on the ERECT image. No evidence of free intraperitoneal air. Calcified gallstones in the RIGHT UPPER quadrant  as noted on the prior CT. Patchy airspace opacities involving the RIGHT lung base, new since the visualized lung bases on the CT 3 days ago. Minimal atelectasis involving the LEFT lung base. Cardiac silhouette mildly enlarged. Pulmonary vascularity normal. Severe degenerative changes involving the shoulder joints. Degenerative changes involving the thoracic and lumbar spine diffusely. BILATERAL hip prostheses with anatomic alignment in the AP projection. IMPRESSION: 1. Minimal improvement in the partial small bowel obstruction since yesterday, though dilated loops of small bowel persist  with associated air-fluid levels. 2. No evidence of free intraperitoneal air. 3. Pneumonia involving the RIGHT lung base. Minimal LEFT basilar atelectasis. 4. Cholelithiasis. Electronically Signed   By: Evangeline Dakin M.D.   On: 03/15/2018 15:15    Anti-infectives: Anti-infectives (From admission, onward)   Start     Dose/Rate Route Frequency Ordered Stop   03/16/18 2200  piperacillin-tazobactam (ZOSYN) IVPB 3.375 g     3.375 g 12.5 mL/hr over 240 Minutes Intravenous Every 8 hours 03/16/18 1405     03/16/18 1430  piperacillin-tazobactam (ZOSYN) IVPB 3.375 g     3.375 g 100 mL/hr over 30 Minutes Intravenous  Once 03/16/18 1405 03/16/18 1457       Assessment/Plan Bladder mass DM HTN A fib - on eliquis GERD Aspiration pneumonia - empirically started on zosyn  Partial SBO - tolerating fulls, having bowel function - advance to soft diet. Continue ambulating. If patient tolerates soft diet he is stable for discharge from surgical standpoint later today. Management of pneumonia per primary team.   ID - none FEN - soft diet VTE - SCDs, eliquis   LOS: 6 days    Jonathan [name] , [facility] 03/17/2018, 7:39 AM Pager: [phone] Consults: [phone] Mon 7:00 am -11:30 AM Tues-Fri 7:00 am-4:30 pm Sat-Sun 7:00 am-11:30 am

## 2018-03-17 NOTE — Discharge Summary (Signed)
Physician Discharge Summary  Jonathan [name] YTK:[id number] DOB: Nov 06, 1930 DOA: 03/11/2018  PCP: Josetta Huddle, MD  Admit date: 03/11/2018 Discharge date: 03/17/2018  Time spent: 60 minutes  Recommendations for Outpatient Follow-up:  1. Follow-up with Josetta Huddle, MD 1 to 2 weeks.  On follow-up patient will need a basic metabolic profile done to follow-up on electrolytes and renal function.  Patient's pneumonia will need to be reassessed.  Patient small bowel obstruction also need to be followed up upon. 2. Follow-up with Dr. Diona Fanti of urology in 1 to 2 weeks for cystoscopy for further evaluation of bladder.   Discharge Diagnoses:  Principal Problem:   Small bowel obstruction ([place]) Active Problems:   CAD (coronary artery disease)   Persistent atrial fibrillation (H[facility])   Essential hypertension   Diabetes mellitus (HCC)   GERD (gastroesophageal reflux disease)   Esophageal stricture   SBO (small bowel obstruction) (HCC)   Hypokalemia   Hypomagnesemia   Bladder mass   Aspiration pneumonia due to gastric secretions ([place])   Hypervolemia   Discharge Condition: Stable and improved  Diet recommendation: Regular  Filed Weights   03/16/18 0634 03/16/18 0639 03/17/18 0544  Weight: 82.4 kg (181 lb 10.5 oz) 80.7 kg (177 lb 14.6 oz) 80.3 kg (177 lb 0.5 oz)    History of present illness:   Jonathan [name] is a 84 y.o. male with medical history significant of atrial fibrillation on chronic anticoagulation with Eliquis, coronary artery disease, type 2 diabetes mellitus, history of esophageal strictures status post multiple dilatations last dilatation 02/2017, hypertension, history of prior inguinal hernia surgeries x3 in 1961 in 1962 who presents to the ED at the [facility] with sudden onset left lower quadrant cramping constant abdominal pain.  Patient stated last bowel movement was the night prior to admission.  Patient stated woke up at 6 AM on the day of admission was feeling  fine however at 7 AM had left lower quadrant abdominal pain cramping in nature nonradiating and constant with some associated nausea x1.  Patient denies any emesis.  Patient denied any syncopal episode, no dizziness, no cough, no hematemesis, no hematochezia, no melena, no fever, no chills, no chest pain, no shortness of breath, no dysuria, no diarrhea.  No focal neurological deficits.  It was noted.  Patient and wife stated that when patient presented to the ED was noted to be significantly diaphoretic.  ED Course: Patient seen in the ED, urinalysis which was done was unremarkable.  CBC done was unremarkable.  Comprehensive metabolic profile had a glucose of 157 otherwise was unremarkable.  CT abdomen and pelvis that showed an acute small bowel obstruction probably in the ileum, small amount of associated ascites.  Small hiatal hernia.  Small to moderate pericardial effusion.  Small liver question cirrhosis.  Cholelithiasis.  Aortic atherosclerosis without aneurysm.  Markedly enlarged prostate.  4.5 cm abnormality in the right external iliac region that could represent an nodal mass.  Differential diagnosis includes cystic abnormality emanating from the right hip joint and even pseudoaneurysm.  Per ED physician case was discussed with CT findings over the telephone with general surgery, Dr. Johney Maine who recommended medical management and admission to the hospitalist group.  [facility] were called to admit the patient for further evaluation and management.    Hospital Course:  1 acute small bowel obstruction Questionable etiology.  He was admitted with acute small bowel obstruction.  Patient noted to have a prior history of hernia repair x3 in the 1960s.  Patient  was placed on bowel rest, IV fluids, supportive care.  NG tube was placed.  General surgery was consulted and followed the patient throughout the hospitalization. Abdominal film with filling defect noted in the bladder similar to that seen on  CT suspicious for bladder mass, also consistent with small bowel obstruction.  Patient noted to have large bowel movement and passing gas on 03/13/2018.  NG tube was removed.  Patient started on clears.  Patient noted to have a bout of emesis 03/14/2018, with abdominal distention and concern for recurrent small bowel obstruction and made n.p.o.  Patient was monitored and patient improved clinically with bowel movements and flatus.  Patient did not have any nausea or vomiting.  Patient was started back on clear liquids and diet advanced which he tolerated.  Abdominal films which were done showed improvement.  Patient was subsequently advanced on his diet to a soft diet which he tolerated.  IV fluids were saline locked.  Patient will be discharged home in stable and improved condition is to follow-up with PCP in the outpatient setting.    2.  Volume overload Patient noted to be volume overloaded on exam with some diffuse crackles felt secondary to aggressive hydration on admission.  Patient was placed on IV Lasix with good urine output.  Patient improved clinically satting 96% on room air and on ambulation.  Outpatient follow-up with PCP.  3.  Probable aspiration pneumonia Noted on chest x-ray.  Patient had, small bowel obstruction had some episodes of emesis.  Patient remained afebrile.  Normal white count.  Cultures were obtained which are negative to date.  Sputum Gram stain and culture were obtained.  Patient was started empirically on IV Zosyn and nebulizer treatments.  Patient remained afebrile with a normal white count.  Patient will be transitioned to oral Augmentin for 6 more days to complete a one-week course of antibiotic treatment.  4.  Bladder mass. Patient noted with an acute small bowel obstruction.  Bladder mass/bladder tumor noted on CT.  Urology was consulted assessed the patient and patient noted to have a bladder tumor.  Urology recommended outpatient follow-up for further evaluation and  management with patient's urologist Dr. Diona Fanti.   5.  Diabetes mellitus Patient was maintained on sliding scale insulin during the hospitalization.  6.  Hypertension Patient in the hospitalization was placed on IV enalapril while n.p.o. for blood pressure control.  As patient improved and was started back on a oral diet he was placed back on home regimen of antihypertensive medications.  Outpatient follow-up.    7.  Persistent atrial fibrillation CHA2DS2VASC score 5 Rate remained controlled during the hospitalization.  Patient was placed empirically on IV heparin secondary to problem #1 in case procedure was needed.    As patient improved clinically he was subsequently transitioned from IV heparin to oral Eliquis.  Patient will be discontinued back on home dose Eliquis.     8.  Gastroesophageal reflux disease Patient maintained on IV Pepcid during the hospitalization as he was n.p.o. will be transition back to home regimen on discharge.       Procedures:  Small bowel follow-through 03/12/2018  CT abdomen and pelvis 03/11/2018  Abdominal film 03/12/2018, 03/11/2018, 03/14/2018, 03/15/2018      Consultations:  General surgery: Dr. Marcello Moores 03/11/2018.  Urology: Dr. Alyson Ingles 03/13/2018      Discharge Exam: Vitals:   03/17/18 0801 03/17/18 1357  BP: (!) 169/70 115/62  Pulse: (!) 46 62  Resp:  18  Temp:  98.2 F (  36.8 C)  SpO2: 98% 96%    General: NAD Cardiovascular: RRR Respiratory: Right basilar coarse breath sounds.  Discharge Instructions   Discharge Instructions    Diet general   Complete by:  As directed    Increase activity slowly   Complete by:  As directed      Allergies as of 03/17/2018   No Known Allergies     Medication List    TAKE these medications   acetaminophen 500 MG tablet Commonly known as:  TYLENOL Take 500 mg by mouth every 6 (six) hours as needed.   albuterol 108 (90 Base) MCG/ACT inhaler Commonly known as:  PROVENTIL  HFA;VENTOLIN HFA Inhale 2 puffs into the lungs every 6 (six) hours as needed for wheezing or shortness of breath. 2 puffs 3 times daily x 5 days then every 6 hours as needed.   amLODipine 5 MG tablet Commonly known as:  NORVASC Take 5 mg by mouth daily.   amoxicillin-clavulanate 875-125 MG tablet Commonly known as:  AUGMENTIN Take 1 tablet by mouth 2 (two) times daily for 6 days.   apixaban 5 MG Tabs tablet Commonly known as:  ELIQUIS Take 1 tablet (5 mg total) by mouth 2 (two) times daily.   atorvastatin 10 MG tablet Commonly known as:  LIPITOR Take 10 mg tablet by mouth Monday, Wednesday and friday   finasteride 5 MG tablet Commonly known as:  PROSCAR Take 5 mg by mouth every morning.   halobetasol 0.05 % ointment Commonly known as:  ULTRAVATE Apply 1 application topically 2 (two) times daily as needed (itching or swelling). APPLY TWICE DAILY TO HANDS   isosorbide mononitrate 30 MG 24 hr tablet Commonly known as:  IMDUR Take 30 mg by mouth daily with lunch.   losartan 50 MG tablet Commonly known as:  COZAAR Take 50 mg by mouth at bedtime.   NITROSTAT 0.4 MG SL tablet Generic drug:  nitroGLYCERIN Take 0.4 mg by mouth every 5 (five) minutes as needed for chest pain. AS NEEDED FOR CHEST PAIN   omeprazole 20 MG capsule Commonly known as:  PRILOSEC Take 20 mg by mouth daily.   simethicone 80 MG chewable tablet Commonly known as:  MYLICON Chew 2 tablets (160 mg total) by mouth 4 (four) times daily as needed for flatulence.      No Known Allergies Follow-up Information    Franchot Gallo, MD Follow up.   Specialty:  Urology Why:  call for followup--you will need a cystoscopy in the office to examine your bladder. Contact information: [place] [address] [phone]        Josetta Huddle, MD. Schedule an appointment as soon as possible for a visit in 1 week(s).   Specialty:  Internal Medicine Why:  f/u in 1-2 weeks. Contact  information: [address] [place] [address] [phone]            The results of significant diagnostics from this hospitalization (including imaging, microbiology, ancillary and laboratory) are listed below for reference.    Significant Diagnostic Studies: Dg Abd 1 View  Result Date: 03/14/2018 CLINICAL DATA:  Abdominal pain and bloating in a patient with a small bowel obstruction. EXAM: ABDOMEN - 1 VIEW COMPARISON:  Single-view of the abdomen 03/11/2018 and 03/12/2018. FINDINGS: There has been increase in gaseous distention of small bowel with loops measuring up to approximately 5.7 cm seen on today's examination compared to 4.3 cm on the study 2 days ago. There is also  gaseous distention of the stomach. NG tube seen on the most recent examination is no longer visualized. Bilateral hip arthroplasties are in place. Asymmetric positioning of the femoral head on the right is consistent with wear of the acetabular lining. IMPRESSION: Worsened appearance of small-bowel obstruction. NG tube seen on yesterday's study is not visualized today and presumably has been removed. Electronically Signed   By: Inge Rise M.D.   On: 03/14/2018 11:46   Ct Abdomen Pelvis W Contrast  Result Date: 03/11/2018 CLINICAL DATA:  Abdominal pain beginning today. EXAM: CT ABDOMEN AND PELVIS WITH CONTRAST TECHNIQUE: Multidetector CT imaging of the abdomen and pelvis was performed using the standard protocol following bolus administration of intravenous contrast. CONTRAST:  180mL ISOVUE-300 IOPAMIDOL (ISOVUE-300) INJECTION 61% COMPARISON:  10/21/2008 FINDINGS: Lower chest: Small hiatal hernia. Small to moderate pericardial effusion. Hepatobiliary: The liver appears somewhat small. Question the possibility of cirrhosis. Multiple gallstones dependent in the gallbladder without CT evidence of cholecystitis or obstruction. Pancreas: Normal Spleen: Normal Adrenals/Urinary Tract: Adrenal glands are  normal. Kidneys are normal. No cyst, mass, stone or hydronephrosis. No definite bladder abnormality. Cannot rule out a posterior bladder wall mass or clot. This could simply be impression from the very large prostate. Stomach/Bowel: Dilated fluid and air-filled loops of small intestine in the central abdomen with collapsed small intestine distal to that consistent with small bowel obstruction. Specific etiology not clearly discernible. Sigmoid diverticulosis. No evidence of diverticulitis. Vascular/Lymphatic: Massive external iliac adenopathy on the right, extending up 2 4.5 cm in diameter. This is not well seen because of artifact from the hip replacements. Cannot completely rule out that this could represent a cystic abnormality emanating from the right hip or even could represent a pseudo aneurysm. No abdominal retroperitoneal adenopathy. Aortic atherosclerosis. Reproductive: Enlarged prostate as noted above. Other: Small amount of ascites. Musculoskeletal: Ordinary lumbar degenerative changes. IMPRESSION: Acute small bowel obstruction, probably in the ileum. Specific etiology not discernible. Small amount of associated ascites. Small hiatal hernia.  Small to moderate pericardial effusion. Small liver.  Question cirrhosis.  Chololithiasis. Aortic atherosclerosis without aneurysm. Markedly enlarged prostate. Not well seen because of artifact from the hips. 4.5 cm abnormality in the right external iliac region that could represent a nodal mass. The differential diagnosis does include a cystic abnormality emanating from the right hip joint and even pseudo aneurysm. In the setting of the enlarged prostate, the possibility of metastatic prostate cancer does exist, though there are no enlarged nodes seen cephalad to that. Electronically Signed   By: Nelson Chimes M.D.   On: 03/11/2018 13:45   Dg Abd 2 Views  Result Date: 03/16/2018 CLINICAL DATA:  Follow up small bowel obstruction EXAM: ABDOMEN - 2 VIEW COMPARISON:   03/15/2018 FINDINGS: Scattered large and small bowel gas is noted. The degree of small bowel dilatation has improved in the interval from the prior exam. No free air is seen. Degenerative changes of lumbar spine are noted. Postsurgical changes of the hips are seen. IMPRESSION: Improvement and small-bowel dilatation when compare with the prior exam. A single loop of minimally dilated small bowel remains. Electronically Signed   By: Inez Catalina M.D.   On: 03/16/2018 08:14   Dg Abd Acute W/chest  Result Date: 03/15/2018 CLINICAL DATA:  Follow-up small bowel obstruction. EXAM: DG ABDOMEN ACUTE W/ 1V CHEST COMPARISON:  Abdomen x-ray 03/14/2018, 03/12/2018, 03/11/2018. CT abdomen and pelvis 03/11/2018. CT chest 04/19/2005 and earlier. Chest x-ray 12/06/2004. FINDINGS: Moderate gaseous distention of multiple loops of small bowel throughout  the upper abdomen, minimally improved since yesterday. Gas and stool throughout normal caliber colon from cecum to rectum. Small bowel air-fluid levels on the ERECT image. No evidence of free intraperitoneal air. Calcified gallstones in the RIGHT UPPER quadrant as noted on the prior CT. Patchy airspace opacities involving the RIGHT lung base, new since the visualized lung bases on the CT 3 days ago. Minimal atelectasis involving the LEFT lung base. Cardiac silhouette mildly enlarged. Pulmonary vascularity normal. Severe degenerative changes involving the shoulder joints. Degenerative changes involving the thoracic and lumbar spine diffusely. BILATERAL hip prostheses with anatomic alignment in the AP projection. IMPRESSION: 1. Minimal improvement in the partial small bowel obstruction since yesterday, though dilated loops of small bowel persist with associated air-fluid levels. 2. No evidence of free intraperitoneal air. 3. Pneumonia involving the RIGHT lung base. Minimal LEFT basilar atelectasis. 4. Cholelithiasis. Electronically Signed   By: Evangeline Dakin M.D.   On: 03/15/2018  15:15   Dg Abd Portable 1v-small Bowel Obstruction Protocol-initial, 8 Hr Delay  Result Date: 03/12/2018 CLINICAL DATA:  Followup small bowel protocol EXAM: PORTABLE ABDOMEN - 1 VIEW COMPARISON:  03/11/18 FINDINGS: Scattered large and small bowel gas is noted. Some persistent small bowel dilatation is noted. Nasogastric catheter is seen within the stomach at the superior aspect of the film. No significant contrast is noted on this image. Clinical correlation is recommended. Contrast material is noted within the bladder from the recent CT examination. There remains some inferior indentation likely related to an enlarged prostate although a second filling defect is noted suspicious for underlying bladder mass. Further evaluation is recommended when clinically able. Bilateral hip replacements are noted. Degenerative changes of the lumbar spine are seen. IMPRESSION: Nasogastric catheter within the stomach. No contrast is noted on this exam question whether contrast was administered. Clinical correlation is recommended. Filling defect within the bladder similar to that seen on recent CT suspicious for bladder mass. Further evaluation when clinically able is recommended. Electronically Signed   By: Inez Catalina M.D.   On: 03/12/2018 08:40   Dg Abd Portable 1v-small Bowel Protocol-position Verification  Result Date: 03/11/2018 CLINICAL DATA:  NG tube placement EXAM: PORTABLE ABDOMEN - 1 VIEW COMPARISON:  CT abdomen/pelvis dated 03/11/2018 FINDINGS: Enteric tube looped in the proximal stomach with its tip in the gastric cardia. Mildly dilated loops of small bowel in the right mid abdomen. This is better evaluated on CT. Excretory contrast in the bladder. Status post bilateral hip arthroplasty. IMPRESSION: Enteric tube looped in the proximal stomach with its tip in the gastric cardia. Electronically Signed   By: Julian Hy M.D.   On: 03/11/2018 21:33    Microbiology: Recent Results (from the past 240  hour(s))  Culture, blood (routine x 2) Call MD if unable to obtain prior to antibiotics being given     Status: None (Preliminary result)   Collection Time: 03/16/18  2:08 PM  Result Value Ref Range Status   Specimen Description   Final    BLOOD LEFT ARM Performed at [facility] [address] [place], [place] [address]    Special Requests   Final    BOTTLES DRAWN AEROBIC AND ANAEROBIC Blood Culture adequate volume Performed at [facility] [address], [place], [place] [address]    Culture   Final    NO GROWTH < 24 HOURS Performed at [facility] [address] [place], [place] [address]    Report Status PENDING  Incomplete  Culture, blood (routine x 2) Call MD if unable to obtain prior to antibiotics being given     Status: None (Preliminary result)   Collection Time: 03/16/18  2:15 PM  Result Value Ref Range Status   Specimen Description   Final    BLOOD RIGHT HAND Performed at [facility] [address] [place], [place] [address]    Special Requests   Final    BOTTLES DRAWN AEROBIC AND ANAEROBIC Blood Culture adequate volume Performed at [facility] [address], [place], [place] [address]    Culture   Final    NO GROWTH < 24 HOURS Performed at [facility] [address], [place], [place] [address]    Report Status PENDING  Incomplete     Labs: Basic Metabolic Panel: Recent Labs  Lab 03/13/18 0525 03/14/18 0459 03/15/18 0356 03/16/18 0423 03/17/18 0427  NA 144 142 141 142 141  K 3.9 3.7 3.0* 3.5 3.2*  CL 112* 108 110 114* 108  CO2 29 26 22 22 22   GLUCOSE 130* 183* 137* 113* 125*  BUN 25* 17 22* 18 12  CREATININE 0.97 0.84 0.83 0.77 0.90  CALCIUM 8.7* 8.6* 8.0* 7.9* 8.5*  MG 1.9 1.7 1.7 2.2 1.8   Liver Function Tests: Recent Labs  Lab 03/11/18 1159 03/15/18 0356  AST 23 16  ALT 14* 11*  ALKPHOS 81 42  BILITOT 0.8 1.1  PROT 7.4 5.2*  ALBUMIN 4.2  2.7*   Recent Labs  Lab 03/11/18 1159  LIPASE 23   No results for input(s): AMMONIA in the last 168 hours. CBC: Recent Labs  Lab 03/14/18 0459 03/14/18 1118 03/15/18 0356 03/16/18 0423 03/17/18 0427  WBC 3.2* 2.5* 6.7 8.0 9.7  HGB 15.0 15.1 12.5* 12.0* 13.4  HCT 44.5 44.2 37.6* 36.6* 40.2  MCV 87.8 86.5 87.0 88.2 88.2  PLT 171 187 173 151 164   Cardiac Enzymes: No results for input(s): CKTOTAL, CKMB, CKMBINDEX, TROPONINI in the last 168 hours. BNP: BNP (last 3 results) No results for input(s): BNP in the last 8760 hours.  ProBNP (last 3 results) No results for input(s): PROBNP in the last 8760 hours.  CBG: Recent Labs  Lab 03/13/18 0835 03/14/18 0758 03/15/18 0751 03/16/18 0742 03/17/18 0803  GLUCAP 131* 189* 104* 87 108*       Signed:  Irine Seal MD.  [facility] 03/17/2018, 4:15 PM

## 2018-03-21 LAB — CULTURE, BLOOD (ROUTINE X 2)
CULTURE: NO GROWTH
Culture: NO GROWTH
SPECIAL REQUESTS: ADEQUATE
Special Requests: ADEQUATE

## 2018-03-23 DIAGNOSIS — M109 Gout, unspecified: Secondary | ICD-10-CM | POA: Diagnosis not present

## 2018-03-23 DIAGNOSIS — E8779 Other fluid overload: Secondary | ICD-10-CM | POA: Diagnosis not present

## 2018-04-07 DIAGNOSIS — G4733 Obstructive sleep apnea (adult) (pediatric): Secondary | ICD-10-CM | POA: Diagnosis not present

## 2018-04-13 ENCOUNTER — Encounter: Payer: Self-pay | Admitting: Cardiovascular Disease

## 2018-04-13 ENCOUNTER — Encounter (INDEPENDENT_AMBULATORY_CARE_PROVIDER_SITE_OTHER): Payer: Self-pay

## 2018-04-13 ENCOUNTER — Ambulatory Visit (INDEPENDENT_AMBULATORY_CARE_PROVIDER_SITE_OTHER): Payer: Medicare Other | Admitting: Cardiovascular Disease

## 2018-04-13 VITALS — BP 126/64 | HR 59 | Ht 67.0 in | Wt 169.9 lb

## 2018-04-13 DIAGNOSIS — I2510 Atherosclerotic heart disease of native coronary artery without angina pectoris: Secondary | ICD-10-CM | POA: Diagnosis not present

## 2018-04-13 DIAGNOSIS — I10 Essential (primary) hypertension: Secondary | ICD-10-CM | POA: Diagnosis not present

## 2018-04-13 DIAGNOSIS — I480 Paroxysmal atrial fibrillation: Secondary | ICD-10-CM | POA: Diagnosis not present

## 2018-04-13 NOTE — Patient Instructions (Addendum)
Medication Instructions:  Your physician recommends that you continue on your current medications as directed. Please refer to the Current Medication list given to you today.   Labwork: None Ordered   Testing/Procedures: None Ordered   Follow-Up: Your physician wants you to follow-up in: 6 months with Pecolia Ades, NP.  You will receive a reminder letter in the mail two months in advance. If you don't receive a letter, please call our office to schedule the follow-up appointment.   If you need a refill on your cardiac medications before your next appointment, please call your pharmacy.   Thank you for choosing [facility]! Christen Bame, RN [phone]

## 2018-04-13 NOTE — Progress Notes (Signed)
Cardiology Office Note   Date:  04/13/2018   ID:  Jonathan [name], DOB [birth year], MRN [id number]  PCP:  Josetta Huddle, MD  Cardiologist:   Mertie Moores, MD   Chief Complaint  Patient presents with  . Hypertension  . Atrial Fibrillation    Problem list 1. Atypical chest pain 2. extreme fatigue  3. essential hypertension 4.  Atrial fibrillation  CHADS2VASC = 4   ( age 84, CAD, HTN) 5.  CAD -  6. Obstructive sleep apnea.     Jonathan [name] is a 84 y.o. male who presents for atypical CP and extreme fatigue. Feels worn out all of the time .  TSH was checked and was reportedly ok.  Had a cardiac cath 20 years ago Tamala Julian )  - has a chronic total occlusion that was treated medically  Had a 2nd cath about 7 years later that was unchanged.  Is retired from truck driving, Chartered loss adjuster, Estate agent.   Nov. 15, 2016:  Jonathan [name] was seen several months ago for some episodes of chest discomfort. He has known coronary artery disease. He had a [place] study which was normal.  Feb 14, 2017:  Jonathan [name] is seen back today for newly diagnosed atrial fib which was seen during an endoscopy and esophageal dilatation several days ago.  He is still in atrial fib - cannot tel that his HR is irregularly Has had generalized fatigue for the past year  .  He was seen for an esophageal dilatation several days ago. He was noted have an irregular heart rate in the monitor showed atrial fibrillation. He presents today for further evaluation.  No CP.   Still able to do his normal yard work  without any chest pain or shortness of breath.  Aug. 22, 2018:  Jonathan [name] is seen for follow up . Hx of CAD  I saw him in May. He has had paroxysmal atrial fibrillation. He was in sinus rhythm when he saw Jonathan [name] in June.  Event monitor showed 15 beats of NS VT knee was worked into see Jonathan [name], nurse practitioner, he was complaining of dizziness. The [place] was stopped at that time and  atorvastatin was started ( Amlodipine component DC'd)   He was seen in the ER on Aug. 10 for fatigue .   His HR was found to be in the 40-50 range - thought to be due to his metoprolol .    Metoprolol was stopped and Captopril was increased to 50 mg BID  Was feeling well Started feeling better yesterday  Feeling well today .    September 03, 2017: Jonathan [name] seems to be feeling a little bit better today.  We changed his captopril to losartan and he seems to be tolerating that fairly well. His blood pressure at home seems to be in the normal range. He is drinking a little bit more water.  Primary MD has changed his atorvastatin to 10 mg M,W,F  April 13, 2018: Jonathan [name] is seen today for follow-up of his atrial fibrillation, hypertension, and coronary artery disease. Was hospitalized recently with SBO. Has maintained NSR  Had a repeat sleep study.  Is found to have obstructive sleep apnea and now uses a CPAP machine.  Past Medical History:  Diagnosis Date  . Acne rosacea   . Arthritis   . Atrial fibrillation ([place])   . Borderline diabetes   . CAD (coronary artery disease)   . Diabetes mellitus without complication ([place])   . Diverticulosis   .  Elevated PSA   . Esophageal stricture   . GERD (gastroesophageal reflux disease)   . History of nuclear stress test    Nuclear stress test 6/18: EF 59, small apical defect (?old MI); no ischemia; Low Risk  . Hypertension   . Myocardial infarction ([place])   . Neck fracture ([place]) 2015  . Shoulder pain     Past Surgical History:  Procedure Laterality Date  . BALLOON DILATION  04/02/2012   Procedure: BALLOON DILATION;  Surgeon: Arta Silence, MD;  Location: [facility] END[facility]COPY;  Service: Endoscopy;  Laterality: N/A;  . BALLOON DILATION N/A 06/16/2013   Procedure: BALLOON DILATION;  Surgeon: Arta Silence, MD;  Location: [facility];  Service: Endoscopy;  Laterality: N/A;  . BALLOON DILATION N/A 11/16/2014   Procedure: BALLOON DILATION;  Surgeon: Arta Silence, MD;  Location: [facility];  Service: Endoscopy;  Laterality: N/A;  . BALLOON DILATION N/A 02/13/2017   Procedure: BALLOON DILATION;  Surgeon: Arta Silence, MD;  Location: [facility] ENDOSCOPY;  Service: Endoscopy;  Laterality: N/A;  . CORNEAL TRANSPLANT    . ESOPHAGOGASTRODUODENOSCOPY  04/02/2012   Procedure: ESOPHAGOGASTRODUODENOSCOPY (EGD);  Surgeon: Arta Silence, MD;  Location: [facility];  Service: Endoscopy;  Laterality: N/A;  . ESOPHAGOGASTRODUODENOSCOPY N/A 05/28/2013   Procedure: ESOPHAGOGASTRODUODENOSCOPY (EGD);  Surgeon: Cleotis Nipper, MD;  Location: [facility];  Service: Endoscopy;  Laterality: N/A;  . ESOPHAGOGASTRODUODENOSCOPY N/A 02/20/2015   Procedure: ESOPHAGOGASTRODUODENOSCOPY (EGD);  Surgeon: Arta Silence, MD;  Location: [facility];  Service: Endoscopy;  Laterality: N/A;  . ESOPHAGOGASTRODUODENOSCOPY (EGD) WITH PROPOFOL N/A 06/16/2013   Procedure: ESOPHAGOGASTRODUODENOSCOPY (EGD) WITH PROPOFOL;  Surgeon: Arta Silence, MD;  Location: [facility];  Service: Endoscopy;  Laterality: N/A;  . ESOPHAGOGASTRODUODENOSCOPY (EGD) WITH PROPOFOL N/A 11/16/2014   Procedure: ESOPHAGOGASTRODUODENOSCOPY (EGD) WITH PROPOFOL;  Surgeon: Arta Silence, MD;  Location: [facility];  Service: Endoscopy;  Laterality: N/A;  . ESOPHAGOGASTRODUODENOSCOPY (EGD) WITH PROPOFOL N/A 02/13/2017   Procedure: ESOPHAGOGASTRODUODENOSCOPY (EGD) WITH PROPOFOL;  Surgeon: Arta Silence, MD;  Location: [facility];  Service: Endoscopy;  Laterality: N/A;  . EYE SURGERY     cataract sx  . HERNIA REPAIR     x 2  . TOTAL HIP ARTHROPLASTY     bilateral     Current Outpatient Medications  Medication Sig Dispense Refill  . amLODipine (NORVASC) 5 MG tablet Take 5 mg by mouth daily.    Marland Kitchen apixaban (ELIQUIS) 5 MG TABS tablet Take 1 tablet (5 mg total) by mouth 2 (two) times daily. 60 tablet 11  . atorvastatin (LIPITOR) 10 MG tablet Take 10 mg tablet by mouth Monday, Wednesday and friday    . finasteride  (PROSCAR) 5 MG tablet Take 5 mg by mouth every morning.     . halobetasol (ULTRAVATE) 0.05 % ointment Apply 1 application topically 2 (two) times daily as needed (itching or swelling). APPLY TWICE DAILY TO HANDS  0  . isosorbide mononitrate (IMDUR) 30 MG 24 hr tablet Take 30 mg by mouth daily with lunch.     . losartan (COZAAR) 50 MG tablet Take 50 mg by mouth at bedtime.     Marland Kitchen NITROSTAT 0.4 MG SL tablet Take 0.4 mg by mouth every 5 (five) minutes as needed for chest pain. AS NEEDED FOR CHEST PAIN  0  . omeprazole (PRILOSEC) 20 MG capsule Take 20 mg by mouth daily.    . simethicone (MYLICON) 80 MG chewable tablet Chew 2 tablets (160 mg total) by mouth 4 (four) times daily as needed for flatulence. 30 tablet 0  No current facility-administered medications for this visit.     Allergies:   Patient has no known allergies.    Social History:  The patient  reports that he has never smoked. He has never used smokeless tobacco. He reports that he does not drink alcohol or use drugs.   Family History:  The patient's Family history is unknown by patient.    ROS:  Please see the history of present illness.     All other systems are reviewed and negative.   Physical Exam: Blood pressure 126/64, pulse (!) 59, height 5\' 7"  (1.702 m), weight 169 lb 14.4 oz (77.1 kg), SpO2 95 %.  GEN:  Well nourished, well developed in no acute distress HEENT: Normal NECK: No JVD; No carotid bruits LYMPHATICS: No lymphadenopathy CARDIAC: RR, no murmurs, rubs, gallops RESPIRATORY:  Clear to auscultation without rales, wheezing or rhonchi  ABDOMEN: Soft, non-tender, non-distended MUSCULOSKELETAL:  No edema; No deformity  SKIN: Warm and dry NEUROLOGIC:  Alert and oriented x 3   EKG:  EKG is ordered today. The ekg ordered Feb 14, 2017:  Atrial fib with rate of 74.   NS ST abn.    Recent Labs: 03/15/2018: ALT 11 03/17/2018: BUN 12; Creatinine, Ser 0.90; Hemoglobin 13.4; Magnesium 1.8; Platelets 164; Potassium 3.2;  Sodium 141    Lipid Panel No results found for: CHOL, TRIG, HDL, CHOLHDL, VLDL, LDLCALC, LDLDIRECT    Wt Readings from Last 3 Encounters:  04/13/18 169 lb 14.4 oz (77.1 kg)  03/17/18 177 lb 0.5 oz (80.3 kg)  09/03/17 178 lb 6.4 oz (80.9 kg)      Other studies Reviewed: Additional studies/ records that were reviewed today include: . Review of the above records demonstrates:    ASSESSMENT AND PLAN:  1. Atrial fib:    Has PAF,    In NSR today    2.  HTN:    BP is normal , continue current medications.  3.  Fatigue :   Likely normal for his age.    4.  Coronary artery disease:    No angina  Continue meds.     Current medicines are reviewed at length with the patient today.  The patient does not have concerns regarding medicines.  The following changes have been made:  no change  Labs/ tests ordered today include:  No orders of the defined types were placed in this encounter.   Disposition:   FU with Gae Bon in 6 months, will see me back in 1 year   Mertie Moores, MD  04/13/2018 9:27 AM    [address], [place], [place]  [address] Phone: [phone]; Fax: [phone]

## 2018-04-17 DIAGNOSIS — R351 Nocturia: Secondary | ICD-10-CM | POA: Diagnosis not present

## 2018-04-17 DIAGNOSIS — N401 Enlarged prostate with lower urinary tract symptoms: Secondary | ICD-10-CM | POA: Diagnosis not present

## 2018-04-17 DIAGNOSIS — R972 Elevated prostate specific antigen [PSA]: Secondary | ICD-10-CM | POA: Diagnosis not present

## 2018-04-17 DIAGNOSIS — N5201 Erectile dysfunction due to arterial insufficiency: Secondary | ICD-10-CM | POA: Diagnosis not present

## 2018-04-22 ENCOUNTER — Ambulatory Visit (INDEPENDENT_AMBULATORY_CARE_PROVIDER_SITE_OTHER): Payer: Medicare Other | Admitting: Podiatry

## 2018-04-22 ENCOUNTER — Encounter: Payer: Self-pay | Admitting: Podiatry

## 2018-04-22 DIAGNOSIS — M79676 Pain in unspecified toe(s): Secondary | ICD-10-CM | POA: Diagnosis not present

## 2018-04-22 DIAGNOSIS — B351 Tinea unguium: Secondary | ICD-10-CM | POA: Diagnosis not present

## 2018-04-22 DIAGNOSIS — D689 Coagulation defect, unspecified: Secondary | ICD-10-CM

## 2018-04-22 NOTE — Progress Notes (Signed)
Patient ID: Jonathan [name], male   DOB: [birth year], 84 y.o.   MRN: [id number] Complaint:  Visit Type: Patient returns to my office for continued preventative foot care services. Complaint: Patient states" my nails have grown long and thick and become painful to walk and wear shoes". The patient presents for preventative foot care services. No changes to ROS.  Patient on eliquiss.  Podiatric Exam: Vascular: dorsalis pedis and posterior tibial pulses are palpable bilateral. Capillary return is immediate. Temperature gradient is WNL. Skin turgor WNL  Sensorium: Normal Semmes Weinstein monofilament test. Normal tactile sensation bilaterally. Nail Exam: Pt has thick disfigured discolored nails with subungual debris noted bilateral entire nail hallux through fifth toenails Ulcer Exam: There is no evidence of ulcer or pre-ulcerative changes or infection. Orthopedic Exam: Muscle tone and strength are WNL. No limitations in general ROM. No crepitus or effusions noted. Foot type and digits show no abnormalities. Bony prominences are unremarkable. Skin: No Porokeratosis. No infection or ulcers  Diagnosis:  Onychomycosis, , Pain in right toe, pain in left toes  Treatment & Plan Procedures and Treatment: Consent by patient was obtained for treatment procedures. The patient understood the discussion of treatment and procedures well. All questions were answered thoroughly reviewed. Debridement of mycotic and hypertrophic toenails, 1 through 5 bilateral and clearing of subungual debris. No ulceration, no infection noted. Signed ABN for 2019. Return Visit-Office Procedure: Patient instructed to return to the office for a follow up visit 3 months for continued evaluation and treatment.   Gardiner Barefoot DPM

## 2018-04-30 ENCOUNTER — Other Ambulatory Visit: Payer: Self-pay | Admitting: Cardiovascular Disease

## 2018-04-30 MED ORDER — NITROSTAT 0.4 MG SL SUBL: 0.4000 mg | SUBLINGUAL_TABLET | SUBLINGUAL | 6 refills | Status: DC | PRN

## 2018-04-30 NOTE — Telephone Encounter (Signed)
Pt's medication was sent to pt's pharmacy as requested. Confirmation received.  °

## 2018-05-14 ENCOUNTER — Other Ambulatory Visit: Payer: Self-pay | Admitting: Cardiology

## 2018-05-14 DIAGNOSIS — I10 Essential (primary) hypertension: Secondary | ICD-10-CM

## 2018-05-14 DIAGNOSIS — I2510 Atherosclerotic heart disease of native coronary artery without angina pectoris: Secondary | ICD-10-CM

## 2018-05-14 NOTE — Telephone Encounter (Signed)
Pt is a 84 yr old male who saw Dr Acie Fredrickson on 04/13/18, wt at that visit was 77.1Kg. Last noted SCr on 03/17/18 was 0.90. Will refill Eliquis 5mg  BID.

## 2018-05-20 DIAGNOSIS — G4733 Obstructive sleep apnea (adult) (pediatric): Secondary | ICD-10-CM | POA: Diagnosis not present

## 2018-05-20 DIAGNOSIS — I4891 Unspecified atrial fibrillation: Secondary | ICD-10-CM | POA: Diagnosis not present

## 2018-05-20 DIAGNOSIS — N400 Enlarged prostate without lower urinary tract symptoms: Secondary | ICD-10-CM | POA: Diagnosis not present

## 2018-05-20 DIAGNOSIS — R413 Other amnesia: Secondary | ICD-10-CM | POA: Diagnosis not present

## 2018-05-20 DIAGNOSIS — E119 Type 2 diabetes mellitus without complications: Secondary | ICD-10-CM | POA: Diagnosis not present

## 2018-05-20 DIAGNOSIS — I10 Essential (primary) hypertension: Secondary | ICD-10-CM | POA: Diagnosis not present

## 2018-05-20 DIAGNOSIS — E785 Hyperlipidemia, unspecified: Secondary | ICD-10-CM | POA: Diagnosis not present

## 2018-05-20 DIAGNOSIS — I252 Old myocardial infarction: Secondary | ICD-10-CM | POA: Diagnosis not present

## 2018-05-20 DIAGNOSIS — E8779 Other fluid overload: Secondary | ICD-10-CM | POA: Diagnosis not present

## 2018-05-20 DIAGNOSIS — I2510 Atherosclerotic heart disease of native coronary artery without angina pectoris: Secondary | ICD-10-CM | POA: Diagnosis not present

## 2018-05-20 DIAGNOSIS — M109 Gout, unspecified: Secondary | ICD-10-CM | POA: Diagnosis not present

## 2018-05-25 DIAGNOSIS — H524 Presbyopia: Secondary | ICD-10-CM | POA: Diagnosis not present

## 2018-05-25 DIAGNOSIS — Z947 Corneal transplant status: Secondary | ICD-10-CM | POA: Diagnosis not present

## 2018-05-25 DIAGNOSIS — Z961 Presence of intraocular lens: Secondary | ICD-10-CM | POA: Diagnosis not present

## 2018-05-25 DIAGNOSIS — H52203 Unspecified astigmatism, bilateral: Secondary | ICD-10-CM | POA: Diagnosis not present

## 2018-05-25 DIAGNOSIS — H5203 Hypermetropia, bilateral: Secondary | ICD-10-CM | POA: Diagnosis not present

## 2018-07-13 DIAGNOSIS — I10 Essential (primary) hypertension: Secondary | ICD-10-CM | POA: Diagnosis not present

## 2018-07-13 DIAGNOSIS — I4891 Unspecified atrial fibrillation: Secondary | ICD-10-CM | POA: Diagnosis not present

## 2018-07-13 DIAGNOSIS — E785 Hyperlipidemia, unspecified: Secondary | ICD-10-CM | POA: Diagnosis not present

## 2018-07-13 DIAGNOSIS — I209 Angina pectoris, unspecified: Secondary | ICD-10-CM | POA: Diagnosis not present

## 2018-07-13 DIAGNOSIS — I25119 Atherosclerotic heart disease of native coronary artery with unspecified angina pectoris: Secondary | ICD-10-CM | POA: Diagnosis not present

## 2018-07-13 DIAGNOSIS — E119 Type 2 diabetes mellitus without complications: Secondary | ICD-10-CM | POA: Diagnosis not present

## 2018-07-13 DIAGNOSIS — N400 Enlarged prostate without lower urinary tract symptoms: Secondary | ICD-10-CM | POA: Diagnosis not present

## 2018-07-21 ENCOUNTER — Encounter: Payer: Self-pay | Admitting: Podiatry

## 2018-07-21 ENCOUNTER — Ambulatory Visit (INDEPENDENT_AMBULATORY_CARE_PROVIDER_SITE_OTHER): Payer: Medicare Other | Admitting: Podiatry

## 2018-07-21 DIAGNOSIS — B351 Tinea unguium: Secondary | ICD-10-CM | POA: Diagnosis not present

## 2018-07-21 DIAGNOSIS — D689 Coagulation defect, unspecified: Secondary | ICD-10-CM | POA: Diagnosis not present

## 2018-07-21 DIAGNOSIS — M79676 Pain in unspecified toe(s): Secondary | ICD-10-CM

## 2018-07-22 ENCOUNTER — Ambulatory Visit: Payer: Medicare Other | Admitting: Podiatry

## 2018-07-26 NOTE — Progress Notes (Signed)
Subjective:   Patient ID: Jonathan [name], male   DOB: 84 y.o.   MRN: [id number]   HPI Patient presents with thick yellow brittle nailbeds 1-5 both feet that are increasingly hard for him to take care of   ROS      Objective:  Physical Exam  Thick yellow brittle nailbeds 1-5 both feet that are incurvated in the corners and painful with palpation     Assessment:  Mycotic nail infection with pain 1-5 both feet     Plan:  Debride painful nailbeds 1-5 both feet with no iatrogenic bleeding noted

## 2018-08-29 ENCOUNTER — Encounter (HOSPITAL_COMMUNITY): Payer: Self-pay

## 2018-08-29 ENCOUNTER — Emergency Department (HOSPITAL_COMMUNITY): Payer: Medicare Other

## 2018-08-29 ENCOUNTER — Other Ambulatory Visit: Payer: Self-pay

## 2018-08-29 ENCOUNTER — Emergency Department (HOSPITAL_COMMUNITY)
Admission: EM | Admit: 2018-08-29 | Discharge: 2018-08-29 | Disposition: A | Discharge status: Home / Self Care | Payer: Medicare Other | Attending: Emergency Medicine | Admitting: Emergency Medicine

## 2018-08-29 DIAGNOSIS — S3993XA Unspecified injury of pelvis, initial encounter: Secondary | ICD-10-CM | POA: Diagnosis not present

## 2018-08-29 DIAGNOSIS — I2510 Atherosclerotic heart disease of native coronary artery without angina pectoris: Secondary | ICD-10-CM | POA: Diagnosis not present

## 2018-08-29 DIAGNOSIS — R51 Headache: Secondary | ICD-10-CM | POA: Diagnosis not present

## 2018-08-29 DIAGNOSIS — E119 Type 2 diabetes mellitus without complications: Secondary | ICD-10-CM | POA: Diagnosis not present

## 2018-08-29 DIAGNOSIS — I10 Essential (primary) hypertension: Secondary | ICD-10-CM | POA: Insufficient documentation

## 2018-08-29 DIAGNOSIS — W010XXA Fall on same level from slipping, tripping and stumbling without subsequent striking against object, initial encounter: Secondary | ICD-10-CM | POA: Diagnosis not present

## 2018-08-29 DIAGNOSIS — S069X1A Unspecified intracranial injury with loss of consciousness of 30 minutes or less, initial encounter: Secondary | ICD-10-CM | POA: Diagnosis not present

## 2018-08-29 DIAGNOSIS — Z7901 Long term (current) use of anticoagulants: Secondary | ICD-10-CM | POA: Diagnosis not present

## 2018-08-29 DIAGNOSIS — S299XXA Unspecified injury of thorax, initial encounter: Secondary | ICD-10-CM | POA: Diagnosis not present

## 2018-08-29 DIAGNOSIS — Y929 Unspecified place or not applicable: Secondary | ICD-10-CM | POA: Insufficient documentation

## 2018-08-29 DIAGNOSIS — W19XXXA Unspecified fall, initial encounter: Secondary | ICD-10-CM

## 2018-08-29 DIAGNOSIS — Z79899 Other long term (current) drug therapy: Secondary | ICD-10-CM | POA: Insufficient documentation

## 2018-08-29 DIAGNOSIS — R55 Syncope and collapse: Secondary | ICD-10-CM | POA: Diagnosis not present

## 2018-08-29 DIAGNOSIS — S0990XA Unspecified injury of head, initial encounter: Secondary | ICD-10-CM | POA: Diagnosis not present

## 2018-08-29 DIAGNOSIS — Y999 Unspecified external cause status: Secondary | ICD-10-CM | POA: Insufficient documentation

## 2018-08-29 DIAGNOSIS — Y939 Activity, unspecified: Secondary | ICD-10-CM | POA: Diagnosis not present

## 2018-08-29 DIAGNOSIS — S069X9A Unspecified intracranial injury with loss of consciousness of unspecified duration, initial encounter: Secondary | ICD-10-CM | POA: Diagnosis not present

## 2018-08-29 DIAGNOSIS — S199XXA Unspecified injury of neck, initial encounter: Secondary | ICD-10-CM | POA: Diagnosis not present

## 2018-08-29 LAB — BASIC METABOLIC PANEL
Anion gap: 8 (ref 5–15)
BUN: 20 mg/dL (ref 8–23)
CO2: 23 mmol/L (ref 22–32)
CREATININE: 1.12 mg/dL (ref 0.61–1.24)
Calcium: 9.5 mg/dL (ref 8.9–10.3)
Chloride: 109 mmol/L (ref 98–111)
GFR calc Af Amer: >60 mL/min (ref >60)
GFR calc non Af Amer: 58 mL/min — ABNORMAL LOW (ref >60)
GLUCOSE: 103 mg/dL — AB (ref 70–99)
POTASSIUM: 4.4 mmol/L (ref 3.5–5.1)
SODIUM: 140 mmol/L (ref 135–145)

## 2018-08-29 LAB — CBC WITH DIFFERENTIAL/PLATELET
ABS IMMATURE GRANULOCYTES: 0.08 10*3/uL — AB (ref 0.00–0.07)
Basophils Absolute: 0 10*3/uL (ref 0.0–0.1)
Basophils Relative: 1 %
EOS PCT: 2 %
Eosinophils Absolute: 0.2 10*3/uL (ref 0.0–0.5)
HEMATOCRIT: 48.1 % (ref 39.0–52.0)
HEMOGLOBIN: 15.6 g/dL (ref 13.0–17.0)
Immature Granulocytes: 1 %
LYMPHS ABS: 1.4 10*3/uL (ref 0.7–4.0)
Lymphocytes Relative: 21 %
MCH: 28.7 pg (ref 26.0–34.0)
MCHC: 32.4 g/dL (ref 30.0–36.0)
MCV: 88.4 fL (ref 80.0–100.0)
MONO ABS: 0.5 10*3/uL (ref 0.1–1.0)
MONOS PCT: 8 %
NEUTROS ABS: 4.3 10*3/uL (ref 1.7–7.7)
Neutrophils Relative %: 67 %
Platelets: 171 10*3/uL (ref 150–400)
RBC: 5.44 MIL/uL (ref 4.22–5.81)
RDW: 13.4 % (ref 11.5–15.5)
WBC: 6.4 10*3/uL (ref 4.0–10.5)
nRBC: 0 % (ref 0.0–0.2)

## 2018-08-29 LAB — TROPONIN I

## 2018-08-29 LAB — CBG MONITORING, ED
GLUCOSE-CAPILLARY: 94 mg/dL (ref 70–99)
Glucose-Capillary: 88 mg/dL (ref 70–99)

## 2018-08-29 LAB — URINALYSIS, ROUTINE W REFLEX MICROSCOPIC
Bilirubin Urine: NEGATIVE
Glucose, UA: NEGATIVE mg/dL
Hgb urine dipstick: NEGATIVE
Ketones, ur: NEGATIVE mg/dL
LEUKOCYTES UA: NEGATIVE
Nitrite: NEGATIVE
PROTEIN: NEGATIVE mg/dL
SPECIFIC GRAVITY, URINE: 1.01 (ref 1.005–1.030)
pH: 7 (ref 5.0–8.0)

## 2018-08-29 MED ORDER — ACETAMINOPHEN 500 MG PO TABS
1000.0000 mg | ORAL_TABLET | Freq: Once | ORAL | Status: AC
  Administered 2018-08-29: 1000 mg via ORAL
  Filled 2018-08-29: qty 2

## 2018-08-29 MED ORDER — SODIUM CHLORIDE 0.9 % IV SOLN
INTRAVENOUS | Status: DC
  Administered 2018-08-29: 13:00:00 via INTRAVENOUS

## 2018-08-29 MED ORDER — SODIUM CHLORIDE 0.9 % IV BOLUS
1000.0000 mL | Freq: Once | INTRAVENOUS | Status: AC
  Administered 2018-08-29: 1000 mL via INTRAVENOUS

## 2018-08-29 NOTE — ED Provider Notes (Signed)
[facility] EMERGENCY DEPARTMENT Provider Note   CSN: [id number] Arrival date & time: 08/29/18  1227     History   Chief Complaint Chief Complaint  Patient presents with  . Fall    HPI Charon Akamine is a 84 y.o. male.  Pt presents to the ED today after a fall.  The pt thinks he tripped getting out of his car, but the fall was not witnessed.  He was with some friends who heard the fall.  They told EMS he had about a 1 minute LOC.  The pt is on Eliquis for a.fib.  He c/o a headache, but denies any other injuries.     Past Medical History:  Diagnosis Date  . Acne rosacea   . Arthritis   . Atrial fibrillation ([place])   . Borderline diabetes   . CAD (coronary artery disease)   . Diabetes mellitus without complication ([place])   . Diverticulosis   . Elevated PSA   . Esophageal stricture   . GERD (gastroesophageal reflux disease)   . History of nuclear stress test    Nuclear stress test 6/18: EF 59, small apical defect (?old MI); no ischemia; Low Risk  . Hypertension   . Myocardial infarction ([place])   . Neck fracture ([place]) 2015  . Shoulder pain     Patient Active Problem List   Diagnosis Date Noted  . Aspiration pneumonia due to gastric secretions ([place])   . Hypervolemia   . Hypokalemia 03/15/2018  . Hypomagnesemia 03/15/2018  . Bladder mass 03/15/2018  . Small bowel obstruction ([place]) 03/11/2018  . Diabetes mellitus ([place]) 03/11/2018  . GERD (gastroesophageal reflux disease) 03/11/2018  . Esophageal stricture 03/11/2018  . SBO (small bowel obstruction) ([place])   . Symptomatic sinus bradycardia   . Persistent atrial fibrillation 02/14/2017  . Essential hypertension 02/14/2017  . CAD (coronary artery disease) 07/10/2015  . Fatigue 07/10/2015    Past Surgical History:  Procedure Laterality Date  . BALLOON DILATION  04/02/2012   Procedure: BALLOON DILATION;  Surgeon: Arta Silence, MD;  Location: [facility] ENDOSCOPY;  Service: Endoscopy;  Laterality: N/A;    . BALLOON DILATION N/A 06/16/2013   Procedure: BALLOON DILATION;  Surgeon: Arta Silence, MD;  Location: [facility]OS[facility];  Service: Endoscopy;  Laterality: N/A;  . BALLOON DILATION N/A 11/16/2014   Procedure: BALLOON DILATION;  Surgeon: Arta Silence, MD;  Location: [facility]OS[facility];  Service: Endoscopy;  Laterality: N/A;  . BALLOON DILATION N/A 02/13/2017   Procedure: BALLOON DILATION;  Surgeon: Arta Silence, MD;  Location: [facility] ENDOSCOPY;  Service: Endoscopy;  Laterality: N/A;  . CORNEAL TRANSPLANT    . ESOPHAGOGASTRODUODENOSCOPY  04/02/2012   Procedure: ESOPHAGOGASTRODUODENOSCOPY (EGD);  Surgeon: Arta Silence, MD;  Location: [facility];  Service: Endoscopy;  Laterality: N/A;  . ESOPHAGOGASTRODUODENOSCOPY N/A 05/28/2013   Procedure: ESOPHAGOGASTRODUODENOSCOPY (EGD);  Surgeon: Cleotis Nipper, MD;  Location: [facility];  Service: Endoscopy;  Laterality: N/A;  . ESOPHAGOGASTRODUODENOSCOPY N/A 02/20/2015   Procedure: ESOPHAGOGASTRODUODENOSCOPY (EGD);  Surgeon: Arta Silence, MD;  Location: [facility];  Service: Endoscopy;  Laterality: N/A;  . ESOPHAGOGASTRODUODENOSCOPY (EGD) WITH PROPOFOL N/A 06/16/2013   Procedure: ESOPHAGOGASTRODUODENOSCOPY (EGD) WITH PROPOFOL;  Surgeon: Arta Silence, MD;  Location: [facility];  Service: Endoscopy;  Laterality: N/A;  . ESOPHAGOGASTRODUODENOSCOPY (EGD) WITH PROPOFOL N/A 11/16/2014   Procedure: ESOPHAGOGASTRODUODENOSCOPY (EGD) WITH PROPOFOL;  Surgeon: Arta Silence, MD;  Location: [facility];  Service: Endoscopy;  Laterality: N/A;  . ESOPHAGOGASTRODUODENOSCOPY (EGD) WITH PROPOFOL N/A 02/13/2017   Procedure: ESOPHAGOGASTRODUODENOSCOPY (EGD) WITH PROPOFOL;  Surgeon: Arta Silence, MD;  Location: [facility] ENDOSCOPY;  Service: Endoscopy;  Laterality: N/A;  . EYE SURGERY     cataract sx  . HERNIA REPAIR     x 2  . TOTAL HIP ARTHROPLASTY     bilateral        Home Medications    Prior to Admission medications   Medication Sig Start Date End Date Taking?  Authorizing Provider  amLODipine (NORVASC) 5 MG tablet Take 5 mg by mouth daily. 02/05/18  Yes [provider]  atorvastatin (LIPITOR) 10 MG tablet Take 10 mg tablet by mouth Monday, Wednesday and friday   Yes [provider]  docusate sodium (STOOL SOFTENER) 100 MG capsule Take 200 mg by mouth at bedtime.   Yes [provider]  ELIQUIS 5 MG TABS tablet TAKE 1 TABLET BY MOUTH TWICE A DAY Patient taking differently: Take 5 mg by mouth 2 (two) times daily.  05/14/18  Yes Nahser, Wonda Cheng, MD  finasteride (PROSCAR) 5 MG tablet Take 5 mg by mouth every morning.    Yes [provider]  halobetasol (ULTRAVATE) 0.05 % ointment Apply 1 application topically 2 (two) times daily as needed (itching or swelling). APPLY TWICE DAILY TO HANDS 04/28/15  Yes [provider]  isosorbide mononitrate (IMDUR) 30 MG 24 hr tablet Take 30 mg by mouth daily with lunch.  01/21/18  Yes [provider]  loratadine (ALLERGY) 10 MG tablet Take 10 mg by mouth at bedtime.   Yes [provider]  losartan (COZAAR) 50 MG tablet Take 50 mg by mouth at bedtime.  01/21/18  Yes [provider]  NITROSTAT 0.4 MG SL tablet Place 1 tablet (0.4 mg total) under the tongue every 5 (five) minutes as needed for chest pain. AS NEEDED FOR CHEST PAIN 04/30/18  Yes Nahser, Wonda Cheng, MD  omeprazole (PRILOSEC) 20 MG capsule Take 20 mg by mouth daily.   Yes [provider]  simethicone (MYLICON) 80 MG chewable tablet Chew 2 tablets (160 mg total) by mouth 4 (four) times daily as needed for flatulence. Patient not taking: Reported on 08/29/2018 03/17/18   Eugenie Filler, MD    Family History Family History  Family history unknown: Yes    Social History Social History   Tobacco Use  . Smoking status: Never Smoker  . Smokeless tobacco: Never Used  Substance Use Topics  . Alcohol use: No  . Drug use: No     Allergies   Patient has no known allergies.   Review  of Systems Review of Systems  Neurological: Positive for headaches.     Physical Exam Updated Vital Signs BP (!) 179/81   Pulse 76   Temp 97.9 F (36.6 C) (Oral)   Resp 18   Ht 5\' 7"  (1.702 m)   Wt 79.4 kg   SpO2 98%   BMI 27.41 kg/m   Physical Exam  Constitutional: He is oriented to person, place, and time. He appears well-developed and well-nourished.  HENT:  Head: Normocephalic.    Right Ear: External ear normal.  Left Ear: External ear normal.  Nose: Nose normal.  Mouth/Throat: Oropharynx is clear and moist.  Eyes: Pupils are equal, round, and reactive to light. Conjunctivae and EOM are normal.  Neck: Normal range of motion. Neck supple.  Cardiovascular: Normal rate, regular rhythm, normal heart sounds and intact distal pulses.  Pulmonary/Chest: Effort normal and breath sounds normal.  Abdominal: Soft. Bowel sounds are normal.  Musculoskeletal: Normal range of motion.  Neurological: He is alert and oriented to person, place, and time.  Skin: Skin is warm. Capillary refill takes less than 2 seconds.  Psychiatric: He has a normal mood and affect. His behavior is normal. Judgment and thought content normal.  Nursing note and vitals reviewed.    ED Treatments / Results  Labs (all labs ordered are listed, but only abnormal results are displayed) Labs Reviewed  BASIC METABOLIC PANEL - Abnormal; Notable for the following components:      Result Value   Glucose, Bld 103 (*)    GFR calc non Af Amer 58 (*)    All other components within normal limits  CBC WITH DIFFERENTIAL/PLATELET - Abnormal; Notable for the following components:   Abs Immature Granulocytes 0.08 (*)    All other components within normal limits  URINALYSIS, ROUTINE W REFLEX MICROSCOPIC - Abnormal; Notable for the following components:   Color, Urine STRAW (*)    All other components within normal limits  TROPONIN I  CBG MONITORING, ED  CBG MONITORING, ED    EKG EKG  Interpretation  Date/Time:  Saturday August 29 2018 12:33:00 EST Ventricular Rate:  79 PR Interval:    QRS Duration: 106 QT Interval:  388 QTC Calculation: 445 R Axis:   77 Text Interpretation:  Sinus rhythm No significant change since last tracing Confirmed by Isla Pence [phone]) on 08/29/2018 12:38:12 PM   Radiology Dg Chest 2 View  Result Date: 08/29/2018 CLINICAL DATA:  Fall EXAM: CHEST - 2 VIEW COMPARISON:  03/15/2018 FINDINGS: Lungs are clear.  No pleural effusion or pneumothorax. The heart is top-normal in size.  Thoracic aortic atherosclerosis. Degenerative changes of the visualized thoracolumbar spine. Degenerative changes of the bilateral shoulders. IMPRESSION: Normal chest radiographs. Electronically Signed   By: Julian Hy M.D.   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 Notably, the femoral head is not centered in the acetabular cup, unchanged. Left hip arthroplasty, without evidence of complication. No fracture or dislocation is seen. Visualized bony pelvis appears intact. Degenerative changes of the lower lumbar spine. 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 Notably, the femoral head is not centered in the acetabular cup, unchanged, suggesting asymmetric wear of the acetabular liner. Left hip arthroplasty, without evidence of complication. No fracture or dislocation is seen. Electronically Signed   By: Julian Hy M.D.   On: 08/29/2018 14:11   Ct Head Wo Contrast  Result Date: 08/29/2018 CLINICAL DATA:  Fall backwards, loss of consciousness, headache, on Eliquis EXAM: CT HEAD WITHOUT CONTRAST CT CERVICAL SPINE WITHOUT  CONTRAST TECHNIQUE: Multidetector CT imaging of the head and cervical spine was performed following the standard protocol without intravenous contrast. Multiplanar CT image reconstructions of the cervical spine were also generated. COMPARISON:  MRI brain dated 07/29/2017.  CT head dated 05/23/2017. FINDINGS: CT HEAD FINDINGS Brain: No evidence of acute infarction, hemorrhage, hydrocephalus, extra-axial collection or mass lesion/mass effect. Small vessel ischemic changes. Vascular: Intracranial atherosclerosis. Skull: Normal. Negative for fracture or focal lesion. Sinuses/Orbits: The visualized paranasal sinuses are essentially clear. The mastoid air cells are unopacified. Other: None. CT CERVICAL SPINE FINDINGS Alignment: Normal cervical lordosis. Skull base and vertebrae: No acute fracture. No primary bone lesion or focal pathologic  process. Soft tissues and spinal canal: No prevertebral fluid or swelling. No visible canal hematoma. Disc levels: Vertebral body heights and intervertebral disc spaces are maintained. Spinal canal is patent. Upper chest: Lung apices are clear Other: Visualized thyroid is notable for a 15 mm left thyroid nodule, of doubtful clinical significance given the patient's age. IMPRESSION: No evidence of acute intracranial abnormality. Small vessel ischemic changes. No evidence of traumatic injury to the cervical spine. Electronically Signed   By: Julian Hy M.D.   On: 08/29/2018 14:00   Ct Cervical Spine Wo Contrast  Result Date: 08/29/2018 CLINICAL DATA:  Fall backwards, loss of consciousness, headache, on Eliquis EXAM: CT HEAD WITHOUT CONTRAST CT CERVICAL SPINE WITHOUT CONTRAST TECHNIQUE: Multidetector CT imaging of the head and cervical spine was performed following the standard protocol without intravenous contrast. Multiplanar CT image reconstructions of the cervical spine were also generated. COMPARISON:  MRI brain dated 07/29/2017.  CT head dated 05/23/2017. FINDINGS: CT  HEAD FINDINGS Brain: No evidence of acute infarction, hemorrhage, hydrocephalus, extra-axial collection or mass lesion/mass effect. Small vessel ischemic changes. Vascular: Intracranial atherosclerosis. Skull: Normal. Negative for fracture or focal lesion. Sinuses/Orbits: The visualized paranasal sinuses are essentially clear. The mastoid air cells are unopacified. Other: None. CT CERVICAL SPINE FINDINGS Alignment: Normal cervical lordosis. Skull base and vertebrae: No acute fracture. No primary bone lesion or focal pathologic process. Soft tissues and spinal canal: No prevertebral fluid or swelling. No visible canal hematoma. Disc levels: Vertebral body heights and intervertebral disc spaces are maintained. Spinal canal is patent. Upper chest: Lung apices are clear Other: Visualized thyroid is notable for a 15 mm left thyroid nodule, of doubtful clinical significance given the patient's age. IMPRESSION: No evidence of acute intracranial abnormality. Small vessel ischemic changes. No evidence of traumatic injury to the cervical spine. Electronically Signed   By: Julian Hy M.D.   On: 08/29/2018 14:00    Procedures Procedures (including critical care time)  Medications Ordered in ED Medications  sodium chloride 0.9 % bolus 1,000 mL (0 mLs Intravenous Stopped 08/29/18 1414)    And  0.9 %  sodium chloride infusion ( Intravenous New Bag/Given 08/29/18 1315)  acetaminophen (TYLENOL) tablet 1,000 mg (1,000 mg Oral Given 08/29/18 1509)     Initial Impression / Assessment and Plan / ED Course  I have reviewed the triage vital signs and the nursing notes.  Pertinent labs & imaging results that were available during my care of the patient were reviewed by me and considered in my medical decision making (see chart for details).   Head ct done b/c of head trauma, +loc, and eliquis use.  Thankfully, this is negative.  All other work up is negative.  Pt is feeling better.  He is a little unsteady on  his feet, but has a walker.    He is stable for d/c.  Return if worse.  Final Clinical Impressions(s) / ED Diagnoses   Final diagnoses:  Fall, initial encounter  Injury of head, initial encounter  On apixaban therapy    ED Discharge Orders    None       Isla Pence, MD 08/29/18 1529

## 2018-08-29 NOTE — ED Notes (Signed)
Pt ambulated in hall O2 stayed at 99 pt stated felt a little unsteady on his feet.

## 2018-08-29 NOTE — ED Notes (Signed)
Wounds cleaned and dressed with bacitracin.

## 2018-08-29 NOTE — ED Triage Notes (Signed)
Pt arrived via [facility] from home after a fall getting out of his car. Pt fell backwards with positive LOC for about 1 minute per witness. Pt is on eliquis. C/o Headache, denies neck or back pain. 18 L AC placed by EMS.

## 2018-09-02 DIAGNOSIS — Z23 Encounter for immunization: Secondary | ICD-10-CM | POA: Diagnosis not present

## 2018-09-02 DIAGNOSIS — I209 Angina pectoris, unspecified: Secondary | ICD-10-CM | POA: Diagnosis not present

## 2018-09-02 DIAGNOSIS — S60419A Abrasion of unspecified finger, initial encounter: Secondary | ICD-10-CM | POA: Diagnosis not present

## 2018-09-02 DIAGNOSIS — E785 Hyperlipidemia, unspecified: Secondary | ICD-10-CM | POA: Diagnosis not present

## 2018-09-02 DIAGNOSIS — I4891 Unspecified atrial fibrillation: Secondary | ICD-10-CM | POA: Diagnosis not present

## 2018-09-02 DIAGNOSIS — E119 Type 2 diabetes mellitus without complications: Secondary | ICD-10-CM | POA: Diagnosis not present

## 2018-09-02 DIAGNOSIS — I10 Essential (primary) hypertension: Secondary | ICD-10-CM | POA: Diagnosis not present

## 2018-09-02 DIAGNOSIS — I252 Old myocardial infarction: Secondary | ICD-10-CM | POA: Diagnosis not present

## 2018-09-02 DIAGNOSIS — R269 Unspecified abnormalities of gait and mobility: Secondary | ICD-10-CM | POA: Diagnosis not present

## 2018-09-02 DIAGNOSIS — N400 Enlarged prostate without lower urinary tract symptoms: Secondary | ICD-10-CM | POA: Diagnosis not present

## 2018-09-02 DIAGNOSIS — I2510 Atherosclerotic heart disease of native coronary artery without angina pectoris: Secondary | ICD-10-CM | POA: Diagnosis not present

## 2018-09-02 DIAGNOSIS — I25119 Atherosclerotic heart disease of native coronary artery with unspecified angina pectoris: Secondary | ICD-10-CM | POA: Diagnosis not present

## 2018-10-05 ENCOUNTER — Encounter: Payer: Self-pay | Admitting: Cardiology

## 2018-10-05 ENCOUNTER — Ambulatory Visit (INDEPENDENT_AMBULATORY_CARE_PROVIDER_SITE_OTHER): Payer: Medicare Other | Admitting: Cardiology

## 2018-10-05 VITALS — BP 140/70 | HR 67 | Ht 67.0 in | Wt 182.8 lb

## 2018-10-05 DIAGNOSIS — I480 Paroxysmal atrial fibrillation: Secondary | ICD-10-CM

## 2018-10-05 DIAGNOSIS — G4733 Obstructive sleep apnea (adult) (pediatric): Secondary | ICD-10-CM | POA: Diagnosis not present

## 2018-10-05 DIAGNOSIS — I2510 Atherosclerotic heart disease of native coronary artery without angina pectoris: Secondary | ICD-10-CM

## 2018-10-05 DIAGNOSIS — Z9989 Dependence on other enabling machines and devices: Secondary | ICD-10-CM | POA: Diagnosis not present

## 2018-10-05 DIAGNOSIS — I10 Essential (primary) hypertension: Secondary | ICD-10-CM

## 2018-10-05 NOTE — Progress Notes (Signed)
Cardiology Office Note:    Date:  10/05/2018   ID:  [name], DOB 06/10/1931, MRN [id number]  PCP:  Josetta Huddle, MD  Cardiologist:  Mertie Moores, MD  Referring MD: Josetta Huddle, MD   Chief Complaint  Patient presents with  . Follow-up    Afib, HTN, CAD    History of Present Illness:    Jonathan [name] is a 84 y.o. male with a past medical history significant for CAD, atrial fibrillation on Eliquis, hypertension, diabetes, GERD.  He is a retired Administrator.  He had a cardiac catheterization over 20 years ago that showed a chronic total occlusion that was treated medically.  Another cath many years ago showed unchanged.  He developed atrial fibrillation in 2018 noted during an endoscopy procedure for esophageal dilatation.  A subsequent 30-day monitor did not show A. Fib but did show an episode of NSVT.  His beta-blocker was increased, but he developed bradycardia.  Metoprolol was stopped and captopril was increased. He had a negative stress test on 03/18/2017.  He was hospitalized this year for small bowel obstruction during which time he maintained sinus rhythm.  He had a repeat sleep study finding OSA and is now on CPAP. He was also seen in the ED after a fall with which he hit his head. His feet apparently got tangled. He has no memory of the event at all.   He was last seen in the office on 04/13/2018 by Dr. Acie Fredrickson at which time he was doing well.  Mr. Bunte is here today for 6 months follow up with his wife. He says that he often loses his balance, especially when he is in a hurry. He has to move slowly for safety. He denies ever having any dizziness. He denies chest discomfort or shortness of breath. He may get very mildly short of breath if he is too big a hurry. He denies orthopnea, PND, or edema.     CHADS2VASC = 4   ( age 32, CAD, HTN)  Past Medical History:  Diagnosis Date  . Acne rosacea   . Arthritis   . Atrial fibrillation ([place])   . Borderline diabetes   .  CAD (coronary artery disease)   . Diabetes mellitus without complication ([place])   . Diverticulosis   . Elevated PSA   . Esophageal stricture   . GERD (gastroesophageal reflux disease)   . History of nuclear stress test    Nuclear stress test 6/18: EF 59, small apical defect (?old MI); no ischemia; Low Risk  . Hypertension   . Myocardial infarction ([place])   . Neck fracture ([place]) 2015  . Shoulder pain     Past Surgical History:  Procedure Laterality Date  . BALLOON DILATION  04/02/2012   Procedure: BALLOON DILATION;  Surgeon: Arta Silence, MD;  Location: [facility];  Service: Endoscopy;  Laterality: N/A;  . BALLOON DILATION N/A 06/16/2013   Procedure: BALLOON DILATION;  Surgeon: Arta Silence, MD;  Location: [facility];  Service: Endoscopy;  Laterality: N/A;  . BALLOON DILATION N/A 11/16/2014   Procedure: BALLOON DILATION;  Surgeon: Arta Silence, MD;  Location: [facility];  Service: Endoscopy;  Laterality: N/A;  . BALLOON DILATION N/A 02/13/2017   Procedure: BALLOON DILATION;  Surgeon: Arta Silence, MD;  Location: [facility] ENDOSCOPY;  Service: Endoscopy;  Laterality: N/A;  . CORNEAL TRANSPLANT    . ESOPHAGOGASTRODUODENOSCOPY  04/02/2012   Procedure: ESOPHAGOGASTRODUODENOSCOPY (EGD);  Surgeon: Arta Silence, MD;  Location: [facility];  Service: Endoscopy;  Laterality: N/A;  .  ESOPHAGOGASTRODUODENOSCOPY N/A 05/28/2013   Procedure: ESOPHAGOGASTRODUODENOSCOPY (EGD);  Surgeon: Cleotis Nipper, MD;  Location: [facility];  Service: Endoscopy;  Laterality: N/A;  . ESOPHAGOGASTRODUODENOSCOPY N/A 02/20/2015   Procedure: ESOPHAGOGASTRODUODENOSCOPY (EGD);  Surgeon: Arta Silence, MD;  Location: [facility];  Service: Endoscopy;  Laterality: N/A;  . ESOPHAGOGASTRODUODENOSCOPY (EGD) WITH PROPOFOL N/A 06/16/2013   Procedure: ESOPHAGOGASTRODUODENOSCOPY (EGD) WITH PROPOFOL;  Surgeon: Arta Silence, MD;  Location: [facility];  Service: Endoscopy;  Laterality: N/A;  . ESOPHAGOGASTRODUODENOSCOPY (EGD)  WITH PROPOFOL N/A 11/16/2014   Procedure: ESOPHAGOGASTRODUODENOSCOPY (EGD) WITH PROPOFOL;  Surgeon: Arta Silence, MD;  Location: [facility];  Service: Endoscopy;  Laterality: N/A;  . ESOPHAGOGASTRODUODENOSCOPY (EGD) WITH PROPOFOL N/A 02/13/2017   Procedure: ESOPHAGOGASTRODUODENOSCOPY (EGD) WITH PROPOFOL;  Surgeon: Arta Silence, MD;  Location: [facility];  Service: Endoscopy;  Laterality: N/A;  . EYE SURGERY     cataract sx  . HERNIA REPAIR     x 2  . TOTAL HIP ARTHROPLASTY     bilateral    Current Medications: Current Meds  Medication Sig  . amLODipine (NORVASC) 5 MG tablet Take 5 mg by mouth daily.  Marland Kitchen atorvastatin (LIPITOR) 10 MG tablet Take 10 mg tablet by mouth Monday, Wednesday and friday  . docusate sodium (STOOL SOFTENER) 100 MG capsule Take 200 mg by mouth at bedtime.  Marland Kitchen ELIQUIS 5 MG TABS tablet TAKE 1 TABLET BY MOUTH TWICE A DAY  . finasteride (PROSCAR) 5 MG tablet Take 5 mg by mouth every morning.   . halobetasol (ULTRAVATE) 0.05 % ointment Apply 1 application topically 2 (two) times daily as needed (itching or swelling). APPLY TWICE DAILY TO HANDS  . isosorbide mononitrate (IMDUR) 30 MG 24 hr tablet Take 30 mg by mouth daily with lunch.   . loratadine (ALLERGY) 10 MG tablet Take 10 mg by mouth at bedtime.  Marland Kitchen losartan (COZAAR) 50 MG tablet Take 50 mg by mouth at bedtime.   Marland Kitchen NITROSTAT 0.4 MG SL tablet Place 1 tablet (0.4 mg total) under the tongue every 5 (five) minutes as needed for chest pain. AS NEEDED FOR CHEST PAIN  . omeprazole (PRILOSEC) 20 MG capsule Take 20 mg by mouth daily.  . simethicone (MYLICON) 80 MG chewable tablet Chew 2 tablets (160 mg total) by mouth 4 (four) times daily as needed for flatulence.     Allergies:   Patient has no known allergies.   Social History   Socioeconomic History  . Marital status: Married    Spouse name: Not on file  . Number of children: Not on file  . Years of education: Not on file  . Highest education level: Not on file   Occupational History  . Not on file  Social Needs  . Financial resource strain: Not on file  . Food insecurity:    Worry: Not on file    Inability: Not on file  . Transportation needs:    Medical: Not on file    Non-medical: Not on file  Tobacco Use  . Smoking status: Never Smoker  . Smokeless tobacco: Never Used  Substance and Sexual Activity  . Alcohol use: No  . Drug use: No  . Sexual activity: Not on file  Lifestyle  . Physical activity:    Days per week: Not on file    Minutes per session: Not on file  . Stress: Not on file  Relationships  . Social connections:    Talks on phone: Not on file    Gets together: Not on file  Attends religious service: Not on file    Active member of club or organization: Not on file    Attends meetings of clubs or organizations: Not on file    Relationship status: Not on file  Other Topics Concern  . Not on file  Social History Narrative  . Not on file     Family History: The patient's Family history is unknown by patient. ROS:   Please see the history of present illness.     All other systems reviewed and are negative.  EKGs/Labs/Other Studies Reviewed:    The following studies were reviewed today:  30-day cardiac monitor resulted 05/02/2017 Study Highlights   NSR  The patient had an episode of nonsustained VT  Occasional PVCs     Lexiscan Myoview 03/18/2017   Nuclear stress EF: 59%. No wall motion abnormalities  There was no ST segment deviation noted during stress.  Defect 1: There is a small defect of mild severity present in the apex location. May be representative of old small apical infarction. Abnormal study.  This is a low risk study. There is no ischemia identified.     Echocardiogram 02/18/2017 Study Conclusions - Left ventricle: The cavity size was normal. Systolic function was   normal. The estimated ejection fraction was in the range of 55%   to 60%. Wall motion was normal; there were no regional  wall   motion abnormalities. There was a reduced contribution of atrial   contraction to ventricular filling, due to increased ventricular   diastolic pressure or atrial contractile dysfunction. Features   are consistent with a pseudonormal left ventricular filling   pattern, with concomitant abnormal relaxation and increased   filling pressure (grade 2 diastolic dysfunction). - Aortic valve: There was trivial regurgitation. - Mitral valve: Calcified annulus. Mildly thickened leaflets .   There was mild regurgitation. - Left atrium: The atrium was severely dilated. - Right atrium: The atrium was mildly dilated. - Pulmonary arteries: Systolic pressure was mildly increased. PA   peak pressure: 40 mm Hg (S).   EKG:  EKG is not ordered today.    Recent Labs: 03/15/2018: ALT 11 03/17/2018: Magnesium 1.8 08/29/2018: BUN 20; Creatinine, Ser 1.12; Hemoglobin 15.6; Platelets 171; Potassium 4.4; Sodium 140   Recent Lipid Panel No results found for: CHOL, TRIG, HDL, CHOLHDL, VLDL, LDLCALC, LDLDIRECT  Physical Exam:    VS:  BP 140/70   Pulse 67   Ht 5\' 7"  (1.702 m)   Wt 182 lb 12.8 oz (82.9 kg)   SpO2 96%   BMI 28.63 kg/m     Wt Readings from Last 3 Encounters:  10/05/18 182 lb 12.8 oz (82.9 kg)  08/29/18 175 lb (79.4 kg)  04/13/18 169 lb 14.4 oz (77.1 kg)     Physical Exam  Constitutional: He is oriented to person, place, and time. He appears well-developed and well-nourished. No distress.  HENT:  Head: Normocephalic and atraumatic.  Neck: Normal range of motion. Neck supple. No JVD present.  Cardiovascular: Normal rate, regular rhythm, normal heart sounds and intact distal pulses. Exam reveals no gallop and no friction rub.  No murmur heard. Pulmonary/Chest: Effort normal and breath sounds normal. No respiratory distress. He has no wheezes. He has no rales.  Abdominal: Soft. Bowel sounds are normal.  Musculoskeletal: Normal range of motion.        General: No deformity or edema.   Neurological: He is alert and oriented to person, place, and time.  Skin: Skin is warm and dry.  Psychiatric: He has a normal mood and affect. His behavior is normal. Judgment and thought content normal.  Vitals reviewed.   ASSESSMENT:    1. Essential hypertension   2. Coronary artery disease involving native coronary artery of native heart without angina pectoris   3. Obstructive sleep apnea on CPAP   4. Paroxysmal atrial fibrillation (HCC)    PLAN:    In order of problems listed above:  1.  Paroxysmal atrial fibrillation: No longer on beta-blocker due to bradycardia. On Eliquis for stroke risk reduction. Heart rate regular on exam.  2.  Hypertension: Reasonably controlled. Continue current medial therapy. Dash diet.   3.  Coronary artery disease: Remote cath showed a chronic total occlusion, treated medically.  Last stress test was in 2018 and was normal.   4.  Obstructive sleep apnea: On CPAP, using consistently.  5.  Hyperlipidemia: On atorvastatin 10 mg Monday, Wednesday, Friday.  Followed by primary care.  Lipid panel in 01/2018 showed LDL 76.  Continue current medical therapy.  6. Frequent falls: pt had a fall in November with hitting his head, CT negative.  We discussed fall program.  And he states that he is moving slowly taking in great care to avoid falls. He denies any lightheadedness or dizziness. We also discussed that if has further falls with head injury this could be serious while on blood thinner. May need to re-evaluate continuing A/C in the future.   7. Memory issues: he has noted memory issues since his hospitalization in 02/2018. Following a train of thought while speaking. No trouble with speech. No limb weakness. He does lose his balance often. CT of the head showed microvascular changes. Continue statin. Advised to follow up with PCP.  We discussed role of exercise in circulatory health. He has a stationary bike that he has not been using lately. He go back to  using it. We discussed safety precautions with exercise.  He is no longer driving.   Medication Adjustments/Labs and Tests Ordered: Current medicines are reviewed at length with the patient today.  Concerns regarding medicines are outlined above. Labs and tests ordered and medication changes are outlined in the patient instructions below:  Patient Instructions  Medication Instructions:  Your physician recommends that you continue on your current medications as directed. Please refer to the Current Medication list given to you today.  If you need a refill on your cardiac medications before your next appointment, please call your pharmacy.   Lab work: None  If you have labs (blood work) drawn today and your tests are completely normal, you will receive your results only by: Marland Kitchen MyChart Message (if you have MyChart) OR . A paper copy in the mail If you have any lab test that is abnormal or we need to change your treatment, we will call you to review the results.  Testing/Procedures: None  Follow-Up: At [facility], you and your health needs are our priority.  As part of our continuing mission to provide you with exceptional heart care, we have created designated Provider Care Teams.  These Care Teams include your primary Cardiologist (physician) and Advanced Practice Providers (APPs -  Physician Assistants and Nurse Practitioners) who all work together to provide you with the care you need, when you need it. You will need a follow up appointment in:  6 months.  Please call our office 2 months in advance to schedule this appointment.  You may see Mertie Moores, MD or one of the following Advanced Practice  Providers on your designated Care Team: Richardson Dopp, PA-C [place], [place] . Daune Perch, NP  Any Other Special Instructions Will Be Listed Below (If Applicable).  DASH Eating Plan DASH stands for "Dietary Approaches to Stop Hypertension." The DASH eating plan is a healthy eating  plan that has been shown to reduce high blood pressure (hypertension). It may also reduce your risk for type 2 diabetes, heart disease, and stroke. The DASH eating plan may also help with weight loss. What are tips for following this plan?  General guidelines  Avoid eating more than 2,300 mg (milligrams) of salt (sodium) a day. If you have hypertension, you may need to reduce your sodium intake to 1,500 mg a day.  Limit alcohol intake to no more than 1 drink a day for nonpregnant women and 2 drinks a day for men. One drink equals 12 oz of beer, 5 oz of wine, or 1 oz of hard liquor.  Work with your health care provider to maintain a healthy body weight or to lose weight. Ask what an ideal weight is for you.  Get at least 30 minutes of exercise that causes your heart to beat faster (aerobic exercise) most days of the week. Activities may include walking, swimming, or biking.  Work with your health care provider or diet and nutrition specialist (dietitian) to adjust your eating plan to your individual calorie needs. Reading food labels   Check food labels for the amount of sodium per serving. Choose foods with less than 5 percent of the Daily Value of sodium. Generally, foods with less than 300 mg of sodium per serving fit into this eating plan.  To find whole grains, look for the word "whole" as the first word in the ingredient list. Shopping  Buy products labeled as "low-sodium" or "no salt added."  Buy fresh foods. Avoid canned foods and premade or frozen meals. Cooking  Avoid adding salt when cooking. Use salt-free seasonings or herbs instead of table salt or sea salt. Check with your health care provider or pharmacist before using salt substitutes.  Do not fry foods. Cook foods using healthy methods such as baking, boiling, grilling, and broiling instead.  Cook with heart-healthy oils, such as olive, canola, soybean, or sunflower oil. Meal planning  Eat a balanced diet that  includes: ? 5 or more servings of fruits and vegetables each day. At each meal, try to fill half of your plate with fruits and vegetables. ? Up to 6-8 servings of whole grains each day. ? Less than 6 oz of lean meat, poultry, or fish each day. A 3-oz serving of meat is about the same size as a deck of cards. One egg equals 1 oz. ? 2 servings of low-fat dairy each day. ? A serving of nuts, seeds, or beans 5 times each week. ? Heart-healthy fats. Healthy fats called Omega-3 fatty acids are found in foods such as flaxseeds and coldwater fish, like sardines, salmon, and mackerel.  Limit how much you eat of the following: ? Canned or prepackaged foods. ? Food that is high in trans fat, such as fried foods. ? Food that is high in saturated fat, such as fatty meat. ? Sweets, desserts, sugary drinks, and other foods with added sugar. ? Full-fat dairy products.  Do not salt foods before eating.  Try to eat at least 2 vegetarian meals each week.  Eat more home-cooked food and less restaurant, buffet, and fast food.  When eating at a restaurant, ask that your  food be prepared with less salt or no salt, if possible. What foods are recommended? The items listed may not be a complete list. Talk with your dietitian about what dietary choices are best for you. Grains Whole-grain or whole-wheat bread. Whole-grain or whole-wheat pasta. Brown rice. [place]. Bulgur. Whole-grain and low-sodium cereals. Pita bread. Low-fat, low-sodium crackers. Whole-wheat flour tortillas. Vegetables Fresh or frozen vegetables (raw, steamed, roasted, or grilled). Low-sodium or reduced-sodium tomato and vegetable juice. Low-sodium or reduced-sodium tomato sauce and tomato paste. Low-sodium or reduced-sodium canned vegetables. Fruits All fresh, dried, or frozen fruit. Canned fruit in natural juice (without added sugar). Meat and other protein foods Skinless chicken or [place]. Ground chicken or [place]. Pork with fat  trimmed off. Fish and seafood. Egg whites. Dried beans, peas, or lentils. Unsalted nuts, nut butters, and seeds. Unsalted canned beans. Lean cuts of beef with fat trimmed off. Low-sodium, lean deli meat. Dairy Low-fat (1%) or fat-free (skim) milk. Fat-free, low-fat, or reduced-fat cheeses. Nonfat, low-sodium ricotta or cottage cheese. Low-fat or nonfat yogurt. Low-fat, low-sodium cheese. Fats and oils Soft margarine without trans fats. Vegetable oil. Low-fat, reduced-fat, or light mayonnaise and salad dressings (reduced-sodium). Canola, safflower, olive, soybean, and sunflower oils. Avocado. Seasoning and other foods Herbs. Spices. Seasoning mixes without salt. Unsalted popcorn and pretzels. Fat-free sweets. What foods are not recommended? The items listed may not be a complete list. Talk with your dietitian about what dietary choices are best for you. Grains Baked goods made with fat, such as croissants, muffins, or some breads. Dry pasta or rice meal packs. Vegetables Creamed or fried vegetables. Vegetables in a cheese sauce. Regular canned vegetables (not low-sodium or reduced-sodium). Regular canned tomato sauce and paste (not low-sodium or reduced-sodium). Regular tomato and vegetable juice (not low-sodium or reduced-sodium). Angie Fava. Olives. Fruits Canned fruit in a light or heavy syrup. Fried fruit. Fruit in cream or butter sauce. Meat and other protein foods Fatty cuts of meat. Ribs. Fried meat. Berniece Salines. Sausage. Bologna and other processed lunch meats. Salami. Fatback. Hotdogs. Bratwurst. Salted nuts and seeds. Canned beans with added salt. Canned or smoked fish. Whole eggs or egg yolks. Chicken or [place] with skin. Dairy Whole or 2% milk, cream, and half-and-half. Whole or full-fat cream cheese. Whole-fat or sweetened yogurt. Full-fat cheese. Nondairy creamers. Whipped toppings. Processed cheese and cheese spreads. Fats and oils Butter. Stick margarine. Lard. Shortening. Ghee. Bacon fat.  Tropical oils, such as coconut, palm kernel, or palm oil. Seasoning and other foods Salted popcorn and pretzels. Onion salt, garlic salt, seasoned salt, table salt, and sea salt. Worcestershire sauce. Tartar sauce. Barbecue sauce. Teriyaki sauce. Soy sauce, including reduced-sodium. Steak sauce. Canned and packaged gravies. Fish sauce. Oyster sauce. Cocktail sauce. Horseradish that you find on the shelf. Ketchup. Mustard. Meat flavorings and tenderizers. [name] cubes. Hot sauce and Tabasco sauce. Premade or packaged marinades. Premade or packaged taco seasonings. Relishes. Regular salad dressings. Where to find more information:  National Heart, Lung, and [place]: https://wilson-eaton.com/  American Heart Association: www.heart.org Summary  The DASH eating plan is a healthy eating plan that has been shown to reduce high blood pressure (hypertension). It may also reduce your risk for type 2 diabetes, heart disease, and stroke.  With the DASH eating plan, you should limit salt (sodium) intake to 2,300 mg a day. If you have hypertension, you may need to reduce your sodium intake to 1,500 mg a day.  When on the DASH eating plan, aim to eat more fresh fruits and vegetables,  whole grains, lean proteins, low-fat dairy, and heart-healthy fats.  Work with your health care provider or diet and nutrition specialist (dietitian) to adjust your eating plan to your individual calorie needs. This information is not intended to replace advice given to you by your health care provider. Make sure you discuss any questions you have with your health care provider. Document Released: 09/19/2011 Document Revised: 09/23/2016 Document Reviewed: 09/23/2016 Elsevier Interactive Patient Education  2019 [place], Daune Perch, NP  10/05/2018 11:28 AM    [place]

## 2018-10-05 NOTE — Patient Instructions (Addendum)
Medication Instructions:  Your physician recommends that you continue on your current medications as directed. Please refer to the Current Medication list given to you today.  If you need a refill on your cardiac medications before your next appointment, please call your pharmacy.   Lab work: None  If you have labs (blood work) drawn today and your tests are completely normal, you will receive your results only by: . MyChart Message (if you have MyChart) OR . A paper copy in the mail If you have any lab test that is abnormal or we need to change your treatment, we will call you to review the results.  Testing/Procedures: None  Follow-Up: At [facility], you and your health needs are our priority.  As part of our continuing mission to provide you with exceptional heart care, we have created designated Provider Care Teams.  These Care Teams include your primary Cardiologist (physician) and Advanced Practice Providers (APPs -  Physician Assistants and Nurse Practitioners) who all work together to provide you with the care you need, when you need it. You will need a follow up appointment in:  6 months.  Please call our office 2 months in advance to schedule this appointment.  You may see Philip Nahser, MD or one of the following Advanced Practice Providers on your designated Care Team: Scott Weaver, PA-C Vin Bhagat, PA-C . Janine Hammond, NP  Any Other Special Instructions Will Be Listed Below (If Applicable).   DASH Eating Plan DASH stands for "Dietary Approaches to Stop Hypertension." The DASH eating plan is a healthy eating plan that has been shown to reduce high blood pressure (hypertension). It may also reduce your risk for type 2 diabetes, heart disease, and stroke. The DASH eating plan may also help with weight loss. What are tips for following this plan?  General guidelines  Avoid eating more than 2,300 mg (milligrams) of salt (sodium) a day. If you have hypertension, you may  need to reduce your sodium intake to 1,500 mg a day.  Limit alcohol intake to no more than 1 drink a day for nonpregnant women and 2 drinks a day for men. One drink equals 12 oz of beer, 5 oz of wine, or 1 oz of hard liquor.  Work with your health care provider to maintain a healthy body weight or to lose weight. Ask what an ideal weight is for you.  Get at least 30 minutes of exercise that causes your heart to beat faster (aerobic exercise) most days of the week. Activities may include walking, swimming, or biking.  Work with your health care provider or diet and nutrition specialist (dietitian) to adjust your eating plan to your individual calorie needs. Reading food labels   Check food labels for the amount of sodium per serving. Choose foods with less than 5 percent of the Daily Value of sodium. Generally, foods with less than 300 mg of sodium per serving fit into this eating plan.  To find whole grains, look for the word "whole" as the first word in the ingredient list. Shopping  Buy products labeled as "low-sodium" or "no salt added."  Buy fresh foods. Avoid canned foods and premade or frozen meals. Cooking  Avoid adding salt when cooking. Use salt-free seasonings or herbs instead of table salt or sea salt. Check with your health care provider or pharmacist before using salt substitutes.  Do not fry foods. Cook foods using healthy methods such as baking, boiling, grilling, and broiling instead.  Cook with heart-healthy   oils, such as olive, canola, soybean, or sunflower oil. Meal planning  Eat a balanced diet that includes: ? 5 or more servings of fruits and vegetables each day. At each meal, try to fill half of your plate with fruits and vegetables. ? Up to 6-8 servings of whole grains each day. ? Less than 6 oz of lean meat, poultry, or fish each day. A 3-oz serving of meat is about the same size as a deck of cards. One egg equals 1 oz. ? 2 servings of low-fat dairy each day.  ? A serving of nuts, seeds, or beans 5 times each week. ? Heart-healthy fats. Healthy fats called Omega-3 fatty acids are found in foods such as flaxseeds and coldwater fish, like sardines, salmon, and mackerel.  Limit how much you eat of the following: ? Canned or prepackaged foods. ? Food that is high in trans fat, such as fried foods. ? Food that is high in saturated fat, such as fatty meat. ? Sweets, desserts, sugary drinks, and other foods with added sugar. ? Full-fat dairy products.  Do not salt foods before eating.  Try to eat at least 2 vegetarian meals each week.  Eat more home-cooked food and less restaurant, buffet, and fast food.  When eating at a restaurant, ask that your food be prepared with less salt or no salt, if possible. What foods are recommended? The items listed may not be a complete list. Talk with your dietitian about what dietary choices are best for you. Grains Whole-grain or whole-wheat bread. Whole-grain or whole-wheat pasta. Brown rice. Oatmeal. Quinoa. Bulgur. Whole-grain and low-sodium cereals. Pita bread. Low-fat, low-sodium crackers. Whole-wheat flour tortillas. Vegetables Fresh or frozen vegetables (raw, steamed, roasted, or grilled). Low-sodium or reduced-sodium tomato and vegetable juice. Low-sodium or reduced-sodium tomato sauce and tomato paste. Low-sodium or reduced-sodium canned vegetables. Fruits All fresh, dried, or frozen fruit. Canned fruit in natural juice (without added sugar). Meat and other protein foods Skinless chicken or turkey. Ground chicken or turkey. Pork with fat trimmed off. Fish and seafood. Egg whites. Dried beans, peas, or lentils. Unsalted nuts, nut butters, and seeds. Unsalted canned beans. Lean cuts of beef with fat trimmed off. Low-sodium, lean deli meat. Dairy Low-fat (1%) or fat-free (skim) milk. Fat-free, low-fat, or reduced-fat cheeses. Nonfat, low-sodium ricotta or cottage cheese. Low-fat or nonfat yogurt. Low-fat,  low-sodium cheese. Fats and oils Soft margarine without trans fats. Vegetable oil. Low-fat, reduced-fat, or light mayonnaise and salad dressings (reduced-sodium). Canola, safflower, olive, soybean, and sunflower oils. Avocado. Seasoning and other foods Herbs. Spices. Seasoning mixes without salt. Unsalted popcorn and pretzels. Fat-free sweets. What foods are not recommended? The items listed may not be a complete list. Talk with your dietitian about what dietary choices are best for you. Grains Baked goods made with fat, such as croissants, muffins, or some breads. Dry pasta or rice meal packs. Vegetables Creamed or fried vegetables. Vegetables in a cheese sauce. Regular canned vegetables (not low-sodium or reduced-sodium). Regular canned tomato sauce and paste (not low-sodium or reduced-sodium). Regular tomato and vegetable juice (not low-sodium or reduced-sodium). Pickles. Olives. Fruits Canned fruit in a light or heavy syrup. Fried fruit. Fruit in cream or butter sauce. Meat and other protein foods Fatty cuts of meat. Ribs. Fried meat. Bacon. Sausage. Bologna and other processed lunch meats. Salami. Fatback. Hotdogs. Bratwurst. Salted nuts and seeds. Canned beans with added salt. Canned or smoked fish. Whole eggs or egg yolks. Chicken or turkey with skin. Dairy Whole or 2% milk,   cream, and half-and-half. Whole or full-fat cream cheese. Whole-fat or sweetened yogurt. Full-fat cheese. Nondairy creamers. Whipped toppings. Processed cheese and cheese spreads. Fats and oils Butter. Stick margarine. Lard. Shortening. Ghee. Bacon fat. Tropical oils, such as coconut, palm kernel, or palm oil. Seasoning and other foods Salted popcorn and pretzels. Onion salt, garlic salt, seasoned salt, table salt, and sea salt. Worcestershire sauce. Tartar sauce. Barbecue sauce. Teriyaki sauce. Soy sauce, including reduced-sodium. Steak sauce. Canned and packaged gravies. Fish sauce. Oyster sauce. Cocktail sauce.  Horseradish that you find on the shelf. Ketchup. Mustard. Meat flavorings and tenderizers. [name] cubes. Hot sauce and Tabasco sauce. Premade or packaged marinades. Premade or packaged taco seasonings. Relishes. Regular salad dressings. Where to find more information:  National Heart, Lung, and Blood Institute: www.nhlbi.nih.gov  American Heart Association: www.heart.org Summary  The DASH eating plan is a healthy eating plan that has been shown to reduce high blood pressure (hypertension). It may also reduce your risk for type 2 diabetes, heart disease, and stroke.  With the DASH eating plan, you should limit salt (sodium) intake to 2,300 mg a day. If you have hypertension, you may need to reduce your sodium intake to 1,500 mg a day.  When on the DASH eating plan, aim to eat more fresh fruits and vegetables, whole grains, lean proteins, low-fat dairy, and heart-healthy fats.  Work with your health care provider or diet and nutrition specialist (dietitian) to adjust your eating plan to your individual calorie needs. This information is not intended to replace advice given to you by your health care provider. Make sure you discuss any questions you have with your health care provider. Document Released: 09/19/2011 Document Revised: 09/23/2016 Document Reviewed: 09/23/2016 Elsevier Interactive Patient Education  2019 Elsevier Inc.  

## 2018-10-12 ENCOUNTER — Encounter (HOSPITAL_BASED_OUTPATIENT_CLINIC_OR_DEPARTMENT_OTHER): Payer: Self-pay | Admitting: Emergency Medicine

## 2018-10-12 ENCOUNTER — Emergency Department (HOSPITAL_BASED_OUTPATIENT_CLINIC_OR_DEPARTMENT_OTHER): Payer: Medicare Other

## 2018-10-12 ENCOUNTER — Emergency Department (HOSPITAL_BASED_OUTPATIENT_CLINIC_OR_DEPARTMENT_OTHER)
Admission: EM | Admit: 2018-10-12 | Discharge: 2018-10-12 | Disposition: A | Discharge status: Home / Self Care | Payer: Medicare Other | Attending: Emergency Medicine | Admitting: Emergency Medicine

## 2018-10-12 ENCOUNTER — Other Ambulatory Visit: Payer: Self-pay

## 2018-10-12 DIAGNOSIS — S0181XA Laceration without foreign body of other part of head, initial encounter: Secondary | ICD-10-CM | POA: Diagnosis not present

## 2018-10-12 DIAGNOSIS — I2510 Atherosclerotic heart disease of native coronary artery without angina pectoris: Secondary | ICD-10-CM | POA: Insufficient documentation

## 2018-10-12 DIAGNOSIS — S0990XA Unspecified injury of head, initial encounter: Secondary | ICD-10-CM | POA: Diagnosis not present

## 2018-10-12 DIAGNOSIS — S0993XA Unspecified injury of face, initial encounter: Secondary | ICD-10-CM | POA: Diagnosis not present

## 2018-10-12 DIAGNOSIS — Y9301 Activity, walking, marching and hiking: Secondary | ICD-10-CM | POA: Insufficient documentation

## 2018-10-12 DIAGNOSIS — W19XXXA Unspecified fall, initial encounter: Secondary | ICD-10-CM

## 2018-10-12 DIAGNOSIS — Z7901 Long term (current) use of anticoagulants: Secondary | ICD-10-CM | POA: Diagnosis not present

## 2018-10-12 DIAGNOSIS — E119 Type 2 diabetes mellitus without complications: Secondary | ICD-10-CM | POA: Diagnosis not present

## 2018-10-12 DIAGNOSIS — S0031XA Abrasion of nose, initial encounter: Secondary | ICD-10-CM | POA: Insufficient documentation

## 2018-10-12 DIAGNOSIS — I10 Essential (primary) hypertension: Secondary | ICD-10-CM | POA: Diagnosis not present

## 2018-10-12 DIAGNOSIS — R05 Cough: Secondary | ICD-10-CM | POA: Insufficient documentation

## 2018-10-12 DIAGNOSIS — Y929 Unspecified place or not applicable: Secondary | ICD-10-CM | POA: Insufficient documentation

## 2018-10-12 DIAGNOSIS — W010XXA Fall on same level from slipping, tripping and stumbling without subsequent striking against object, initial encounter: Secondary | ICD-10-CM | POA: Diagnosis not present

## 2018-10-12 DIAGNOSIS — S199XXA Unspecified injury of neck, initial encounter: Secondary | ICD-10-CM | POA: Insufficient documentation

## 2018-10-12 DIAGNOSIS — R059 Cough, unspecified: Secondary | ICD-10-CM

## 2018-10-12 DIAGNOSIS — Y999 Unspecified external cause status: Secondary | ICD-10-CM | POA: Insufficient documentation

## 2018-10-12 NOTE — Discharge Instructions (Signed)

## 2018-10-12 NOTE — ED Provider Notes (Signed)
Emergency Department Provider Note   I have reviewed the triage vital signs and the nursing notes.   HISTORY  Chief Complaint Fall   HPI Jonathan [name] is a 84 y.o. male with PMH of A-fib on Eliquis, CAD, DM, GERD, and CAD presents to the emergency department for evaluation after mechanical fall.  Patient states he slipped and fell backwards striking his head on the ground and the bridge of his nose.  He sustained a head laceration.  The fall and laceration occurred greater than 24 hours prior to ED evaluation.  He did not have loss of consciousness.  He is not experiencing chest pain, shortness of breath, abdominal pain, back pain.  No pain in the arms or legs.  He has been ambulatory since the event.  Family decided to come in today because they called the PCP who encouraged ED evaluation. He is having mildly productive cough and mild congestion. No face pain.   Past Medical History:  Diagnosis Date  . Acne rosacea   . Arthritis   . Atrial fibrillation ([place])   . Borderline diabetes   . CAD (coronary artery disease)   . Diabetes mellitus without complication ([place])   . Diverticulosis   . Elevated PSA   . Esophageal stricture   . GERD (gastroesophageal reflux disease)   . History of nuclear stress test    Nuclear stress test 6/18: EF 59, small apical defect (?old MI); no ischemia; Low Risk  . Hypertension   . Myocardial infarction ([place])   . Neck fracture ([place]) 2015  . Shoulder pain     Patient Active Problem List   Diagnosis Date Noted  . Obstructive sleep apnea on CPAP 10/05/2018  . Aspiration pneumonia due to gastric secretions ([place])   . Hypervolemia   . Hypokalemia 03/15/2018  . Hypomagnesemia 03/15/2018  . Bladder mass 03/15/2018  . Small bowel obstruction ([place]) 03/11/2018  . Diabetes mellitus ([place]) 03/11/2018  . GERD (gastroesophageal reflux disease) 03/11/2018  . Esophageal stricture 03/11/2018  . SBO (small bowel obstruction) ([place])   . Symptomatic sinus  bradycardia   . Paroxysmal atrial fibrillation ([place]) 02/14/2017  . Essential hypertension 02/14/2017  . CAD (coronary artery disease) 07/10/2015  . Fatigue 07/10/2015    Past Surgical History:  Procedure Laterality Date  . BALLOON DILATION  04/02/2012   Procedure: BALLOON DILATION;  Surgeon: Arta Silence, MD;  Location: [facility] ENDOSCOPY;  Service: Endoscopy;  Laterality: N/A;  . BALLOON DILATION N/A 06/16/2013   Procedure: BALLOON DILATION;  Surgeon: Arta Silence, MD;  Location: [facility];  Service: Endoscopy;  Laterality: N/A;  . BALLOON DILATION N/A 11/16/2014   Procedure: BALLOON DILATION;  Surgeon: Arta Silence, MD;  Location: [facility];  Service: Endoscopy;  Laterality: N/A;  . BALLOON DILATION N/A 02/13/2017   Procedure: BALLOON DILATION;  Surgeon: Arta Silence, MD;  Location: [facility] ENDOSCOPY;  Service: Endoscopy;  Laterality: N/A;  . CORNEAL TRANSPLANT    . ESOPHAGOGASTRODUODENOSCOPY  04/02/2012   Procedure: ESOPHAGOGASTRODUODENOSCOPY (EGD);  Surgeon: Arta Silence, MD;  Location: [facility];  Service: Endoscopy;  Laterality: N/A;  . ESOPHAGOGASTRODUODENOSCOPY N/A 05/28/2013   Procedure: ESOPHAGOGASTRODUODENOSCOPY (EGD);  Surgeon: Cleotis Nipper, MD;  Location: [facility];  Service: Endoscopy;  Laterality: N/A;  . ESOPHAGOGASTRODUODENOSCOPY N/A 02/20/2015   Procedure: ESOPHAGOGASTRODUODENOSCOPY (EGD);  Surgeon: Arta Silence, MD;  Location: [facility];  Service: Endoscopy;  Laterality: N/A;  . ESOPHAGOGASTRODUODENOSCOPY (EGD) WITH PROPOFOL N/A 06/16/2013   Procedure: ESOPHAGOGASTRODUODENOSCOPY (EGD) WITH PROPOFOL;  Surgeon: Arta Silence, MD;  Location:  [facility] ENDOSCOPY;  Service: Endoscopy;  Laterality: N/A;  . ESOPHAGOGASTRODUODENOSCOPY (EGD) WITH PROPOFOL N/A 11/16/2014   Procedure: ESOPHAGOGASTRODUODENOSCOPY (EGD) WITH PROPOFOL;  Surgeon: Arta Silence, MD;  Location: [facility];  Service: Endoscopy;  Laterality: N/A;  . ESOPHAGOGASTRODUODENOSCOPY (EGD) WITH PROPOFOL N/A  02/13/2017   Procedure: ESOPHAGOGASTRODUODENOSCOPY (EGD) WITH PROPOFOL;  Surgeon: Arta Silence, MD;  Location: [facility];  Service: Endoscopy;  Laterality: N/A;  . EYE SURGERY     cataract sx  . HERNIA REPAIR     x 2  . TOTAL HIP ARTHROPLASTY     bilateral   Allergies Patient has no known allergies.  Family History  Family history unknown: Yes    Social History Social History   Tobacco Use  . Smoking status: Never Smoker  . Smokeless tobacco: Never Used  Substance Use Topics  . Alcohol use: No  . Drug use: No    Review of Systems  Constitutional: No fever/chills Eyes: No visual changes. ENT: No sore throat. Cardiovascular: Denies chest pain. Respiratory: Denies shortness of breath. Positive cough and congestion.  Gastrointestinal: No abdominal pain.  No nausea, no vomiting.  No diarrhea.  No constipation. Genitourinary: Negative for dysuria. Musculoskeletal: Negative for back pain. Skin: Negative for rash. Positive scalp laceration and nose injury.  Neurological: Negative for headaches, focal weakness or numbness.  10-point ROS otherwise negative.  ____________________________________________   PHYSICAL EXAM:  VITAL SIGNS: ED Triage Vitals  Enc Vitals Group     BP 10/12/18 1052 137/74     Pulse Rate 10/12/18 1052 71     Resp 10/12/18 1052 16     Temp 10/12/18 1052 98.1 F (36.7 C)     Temp Source 10/12/18 1052 Oral     SpO2 10/12/18 1052 95 %     Weight 10/12/18 1052 175 lb (79.4 kg)     Height 10/12/18 1052 5\' 7"  (1.702 m)   Constitutional: Alert and oriented. Well appearing and in no acute distress. Eyes: Conjunctivae are normal. PERRL.  Head: 4 cm scalp laceration to the crown of the head. Well-approximated and hemostatic. No hematoma.  Nose: No congestion/rhinnorhea. Mouth/Throat: Mucous membranes are moist.  Neck: No stridor. No cervical spine tenderness to palpation. Cardiovascular: Normal rate, regular rhythm. Good peripheral circulation.  Grossly normal heart sounds.   Respiratory: Normal respiratory effort.  No retractions. Lungs CTAB. Gastrointestinal: Soft and nontender. No distention.  fMusculoskeletal: No lower extremity tenderness nor edema. No gross deformities of extremities. Neurologic:  Normal speech and language. No gross focal neurologic deficits are appreciated.  Skin:  Skin is warm, dry and intact. Mild abrasion to the bridge of the nose. No laceration or deformity.   ____________________________________________  RADIOLOGY  Dg Chest 2 View  Result Date: 10/12/2018 CLINICAL DATA:  Cough. EXAM: CHEST - 2 VIEW COMPARISON:  Chest x-ray dated 08/29/2018 FINDINGS: The heart size and mediastinal contours are within normal limits. Both lungs are clear except for peribronchial thickening visible on the lateral view. The visualized skeletal structures are unremarkable. IMPRESSION: Bronchitic changes. Electronically Signed   By: Lorriane Shire M.D.   On: 10/12/2018 11:55   Ct Head Wo Contrast  Result Date: 10/12/2018 CLINICAL DATA:  Fall with facial trauma EXAM: CT HEAD WITHOUT CONTRAST CT MAXILLOFACIAL WITHOUT CONTRAST CT CERVICAL SPINE WITHOUT CONTRAST TECHNIQUE: Multidetector CT imaging of the head, cervical spine, and maxillofacial structures were performed using the standard protocol without intravenous contrast. Multiplanar CT image reconstructions of the cervical spine and maxillofacial structures were also generated. COMPARISON:  08/29/2018  head and cervical spine CT. FINDINGS: CT HEAD FINDINGS Brain: No evidence of acute infarction, hemorrhage, hydrocephalus, extra-axial collection or mass lesion/mass effect. Cerebral volume loss and mild-to-moderate chronic small vessel ischemia in the cerebral white matter. Vascular: Atherosclerotic calcification Skull: No acute fracture. CT MAXILLOFACIAL FINDINGS Osseous: Negative for fracture or mandibular dislocation. Orbits: No evidence of injury.  Bilateral cataract resection  Sinuses: Patchy mucosal thickening in the paranasal sinuses with fluid level in the right maxillary sinus. These changes are new from prior Soft tissues: No noted hematoma or opaque foreign body. CT CERVICAL SPINE FINDINGS Alignment: Levo scoliotic curvature centered at the cervicothoracic junction Skull base and vertebrae: No acute fracture. T1 superior endplate fracture is chronic based on prior and 02/08/2014 cervical spine CT. Soft tissues and spinal canal: No prevertebral fluid or swelling. No visible canal hematoma. Disc levels: Mild degenerative changes for age. No evident impingement. Upper chest: Negative IMPRESSION: 1. No evidence of acute intracranial or cervical spine injury. Negative for facial fracture. 2. Sinusitis that is new from November 2019 CT, including right maxillary fluid level. Electronically Signed   By: Monte Fantasia M.D.   On: 10/12/2018 12:01   Ct Cervical Spine Wo Contrast  Result Date: 10/12/2018 CLINICAL DATA:  Fall with facial trauma EXAM: CT HEAD WITHOUT CONTRAST CT MAXILLOFACIAL WITHOUT CONTRAST CT CERVICAL SPINE WITHOUT CONTRAST TECHNIQUE: Multidetector CT imaging of the head, cervical spine, and maxillofacial structures were performed using the standard protocol without intravenous contrast. Multiplanar CT image reconstructions of the cervical spine and maxillofacial structures were also generated. COMPARISON:  08/29/2018 head and cervical spine CT. FINDINGS: CT HEAD FINDINGS Brain: No evidence of acute infarction, hemorrhage, hydrocephalus, extra-axial collection or mass lesion/mass effect. Cerebral volume loss and mild-to-moderate chronic small vessel ischemia in the cerebral white matter. Vascular: Atherosclerotic calcification Skull: No acute fracture. CT MAXILLOFACIAL FINDINGS Osseous: Negative for fracture or mandibular dislocation. Orbits: No evidence of injury.  Bilateral cataract resection Sinuses: Patchy mucosal thickening in the paranasal sinuses with fluid  level in the right maxillary sinus. These changes are new from prior Soft tissues: No noted hematoma or opaque foreign body. CT CERVICAL SPINE FINDINGS Alignment: Levo scoliotic curvature centered at the cervicothoracic junction Skull base and vertebrae: No acute fracture. T1 superior endplate fracture is chronic based on prior and 02/08/2014 cervical spine CT. Soft tissues and spinal canal: No prevertebral fluid or swelling. No visible canal hematoma. Disc levels: Mild degenerative changes for age. No evident impingement. Upper chest: Negative IMPRESSION: 1. No evidence of acute intracranial or cervical spine injury. Negative for facial fracture. 2. Sinusitis that is new from November 2019 CT, including right maxillary fluid level. Electronically Signed   By: Monte Fantasia M.D.   On: 10/12/2018 12:01   Ct Maxillofacial Wo Contrast  Result Date: 10/12/2018 CLINICAL DATA:  Fall with facial trauma EXAM: CT HEAD WITHOUT CONTRAST CT MAXILLOFACIAL WITHOUT CONTRAST CT CERVICAL SPINE WITHOUT CONTRAST TECHNIQUE: Multidetector CT imaging of the head, cervical spine, and maxillofacial structures were performed using the standard protocol without intravenous contrast. Multiplanar CT image reconstructions of the cervical spine and maxillofacial structures were also generated. COMPARISON:  08/29/2018 head and cervical spine CT. FINDINGS: CT HEAD FINDINGS Brain: No evidence of acute infarction, hemorrhage, hydrocephalus, extra-axial collection or mass lesion/mass effect. Cerebral volume loss and mild-to-moderate chronic small vessel ischemia in the cerebral white matter. Vascular: Atherosclerotic calcification Skull: No acute fracture. CT MAXILLOFACIAL FINDINGS Osseous: Negative for fracture or mandibular dislocation. Orbits: No evidence of injury.  Bilateral cataract resection  Sinuses: Patchy mucosal thickening in the paranasal sinuses with fluid level in the right maxillary sinus. These changes are new from prior Soft  tissues: No noted hematoma or opaque foreign body. CT CERVICAL SPINE FINDINGS Alignment: Levo scoliotic curvature centered at the cervicothoracic junction Skull base and vertebrae: No acute fracture. T1 superior endplate fracture is chronic based on prior and 02/08/2014 cervical spine CT. Soft tissues and spinal canal: No prevertebral fluid or swelling. No visible canal hematoma. Disc levels: Mild degenerative changes for age. No evident impingement. Upper chest: Negative IMPRESSION: 1. No evidence of acute intracranial or cervical spine injury. Negative for facial fracture. 2. Sinusitis that is new from November 2019 CT, including right maxillary fluid level. Electronically Signed   By: Monte Fantasia M.D.   On: 10/12/2018 12:01    ____________________________________________   PROCEDURES  Procedure(s) performed:   Procedures  None ____________________________________________   INITIAL IMPRESSION / ASSESSMENT AND PLAN / ED COURSE  Pertinent labs & imaging results that were available during my care of the patient were reviewed by me and considered in my medical decision making (see chart for details).  Presents to the emergency department after a fall.  He has lacerations which are greater than 24 hours old on the head and small abrasion to the bridge of the nose.  Normal neurologic exam.  Patient also with mild productive cough.  CT imaging of the head, face, cervical spine reviewed with no acute findings.  Patient does have evidence of sinusitis on face CT.  Plan for conservative treatment at home rather than antibiotics and close follow-up.  At this time, the wounds are well approximated and hemostatic.  I discussed with the patient and wife that I am concerned that closing these wounds greater than 24 hours after they occurred would increase the risk of infection and therefore I think this should heal by secondary intention.   CT imaging negative. CXR reviewed. Plan for discharge with  wound care instructions and PCP follow up plan.  ____________________________________________  FINAL CLINICAL IMPRESSION(S) / ED DIAGNOSES  Final diagnoses:  Fall, initial encounter  Injury of head, initial encounter  Head, face & neck injury, initial encounter  Cough   Note:  This document was prepared using Dragon voice recognition software and may include unintentional dictation errors.  Nanda Quinton, MD Emergency Medicine    Deoni Cosey, [facility], MD 10/12/18 [phone]

## 2018-10-12 NOTE — ED Notes (Signed)
ED Provider at bedside. 

## 2018-10-12 NOTE — ED Triage Notes (Signed)
Reports slipped and fell yesterday morning.  Laceration to top of head as well as bridge of nose.  States currently on Eliquis.  Alert and oriented and ambulatory to room in NAD.

## 2018-10-13 DIAGNOSIS — R441 Visual hallucinations: Secondary | ICD-10-CM | POA: Diagnosis not present

## 2018-10-13 DIAGNOSIS — R296 Repeated falls: Secondary | ICD-10-CM | POA: Diagnosis not present

## 2018-10-13 DIAGNOSIS — J40 Bronchitis, not specified as acute or chronic: Secondary | ICD-10-CM | POA: Diagnosis not present

## 2018-10-20 DIAGNOSIS — I10 Essential (primary) hypertension: Secondary | ICD-10-CM | POA: Diagnosis not present

## 2018-10-20 DIAGNOSIS — E669 Obesity, unspecified: Secondary | ICD-10-CM | POA: Diagnosis not present

## 2018-10-20 DIAGNOSIS — E119 Type 2 diabetes mellitus without complications: Secondary | ICD-10-CM | POA: Diagnosis not present

## 2018-10-20 DIAGNOSIS — I252 Old myocardial infarction: Secondary | ICD-10-CM | POA: Diagnosis not present

## 2018-10-20 DIAGNOSIS — Z96643 Presence of artificial hip joint, bilateral: Secondary | ICD-10-CM | POA: Diagnosis not present

## 2018-10-20 DIAGNOSIS — Z9181 History of falling: Secondary | ICD-10-CM | POA: Diagnosis not present

## 2018-10-20 DIAGNOSIS — I4891 Unspecified atrial fibrillation: Secondary | ICD-10-CM | POA: Diagnosis not present

## 2018-10-20 DIAGNOSIS — F0390 Unspecified dementia without behavioral disturbance: Secondary | ICD-10-CM | POA: Diagnosis not present

## 2018-10-20 DIAGNOSIS — J40 Bronchitis, not specified as acute or chronic: Secondary | ICD-10-CM | POA: Diagnosis not present

## 2018-10-20 DIAGNOSIS — Z6827 Body mass index (BMI) 27.0-27.9, adult: Secondary | ICD-10-CM | POA: Diagnosis not present

## 2018-10-20 DIAGNOSIS — I2510 Atherosclerotic heart disease of native coronary artery without angina pectoris: Secondary | ICD-10-CM | POA: Diagnosis not present

## 2018-10-28 ENCOUNTER — Ambulatory Visit (INDEPENDENT_AMBULATORY_CARE_PROVIDER_SITE_OTHER): Payer: Medicare Other | Admitting: Podiatry

## 2018-10-28 ENCOUNTER — Encounter: Payer: Self-pay | Admitting: Podiatry

## 2018-10-28 DIAGNOSIS — B351 Tinea unguium: Secondary | ICD-10-CM

## 2018-10-28 DIAGNOSIS — M109 Gout, unspecified: Secondary | ICD-10-CM | POA: Diagnosis not present

## 2018-10-28 DIAGNOSIS — J40 Bronchitis, not specified as acute or chronic: Secondary | ICD-10-CM | POA: Diagnosis not present

## 2018-10-28 DIAGNOSIS — N400 Enlarged prostate without lower urinary tract symptoms: Secondary | ICD-10-CM | POA: Diagnosis not present

## 2018-10-28 DIAGNOSIS — G4730 Sleep apnea, unspecified: Secondary | ICD-10-CM | POA: Diagnosis not present

## 2018-10-28 DIAGNOSIS — D689 Coagulation defect, unspecified: Secondary | ICD-10-CM

## 2018-10-28 DIAGNOSIS — M79676 Pain in unspecified toe(s): Secondary | ICD-10-CM | POA: Diagnosis not present

## 2018-10-28 DIAGNOSIS — R296 Repeated falls: Secondary | ICD-10-CM | POA: Diagnosis not present

## 2018-10-28 DIAGNOSIS — R441 Visual hallucinations: Secondary | ICD-10-CM | POA: Diagnosis not present

## 2018-10-28 NOTE — Progress Notes (Signed)
Patient ID: Jonathan [name], male   DOB: September 11, 1931, 85 y.o.   MRN: [id number] Complaint:  Visit Type: Patient returns to my office for continued preventative foot care services. Complaint: Patient states" my nails have grown long and thick and become painful to walk and wear shoes". The patient presents for preventative foot care services. No changes to ROS.  Patient on eliquiss. Patient is diagnosed with gout and is under treatment by his medical doctor. Podiatric Exam: Vascular: dorsalis pedis and posterior tibial pulses are palpable bilateral. Capillary return is immediate. Temperature gradient is WNL. Skin turgor WNL  Sensorium: Normal Semmes Weinstein monofilament test. Normal tactile sensation bilaterally. Nail Exam: Pt has thick disfigured discolored nails with subungual debris noted bilateral entire nail hallux through fifth toenails Ulcer Exam: There is no evidence of ulcer or pre-ulcerative changes or infection. Orthopedic Exam: Muscle tone and strength are WNL. No limitations in general ROM. No crepitus or effusions noted. Foot type and digits show no abnormalities. Bony prominences are unremarkable. Skin: No Porokeratosis. No infection or ulcers  Diagnosis:  Onychomycosis, , Pain in right toe, pain in left toes  Treatment & Plan Procedures and Treatment: Consent by patient was obtained for treatment procedures. The patient understood the discussion of treatment and procedures well. All questions were answered thoroughly reviewed. Debridement of mycotic and hypertrophic toenails, 1 through 5 bilateral and clearing of subungual debris. No ulceration, no infection noted. Signed ABN for 2020.Marland Kitchen Return Visit-Office Procedure: Patient instructed to return to the office for a follow up visit 3 months for continued evaluation and treatment.   Gardiner Barefoot DPM

## 2018-10-29 DIAGNOSIS — I2510 Atherosclerotic heart disease of native coronary artery without angina pectoris: Secondary | ICD-10-CM | POA: Diagnosis not present

## 2018-10-29 DIAGNOSIS — I10 Essential (primary) hypertension: Secondary | ICD-10-CM | POA: Diagnosis not present

## 2018-10-29 DIAGNOSIS — E119 Type 2 diabetes mellitus without complications: Secondary | ICD-10-CM | POA: Diagnosis not present

## 2018-10-29 DIAGNOSIS — I252 Old myocardial infarction: Secondary | ICD-10-CM | POA: Diagnosis not present

## 2018-10-29 DIAGNOSIS — I4891 Unspecified atrial fibrillation: Secondary | ICD-10-CM | POA: Diagnosis not present

## 2018-10-29 DIAGNOSIS — F0390 Unspecified dementia without behavioral disturbance: Secondary | ICD-10-CM | POA: Diagnosis not present

## 2018-11-03 DIAGNOSIS — F0390 Unspecified dementia without behavioral disturbance: Secondary | ICD-10-CM | POA: Diagnosis not present

## 2018-11-03 DIAGNOSIS — I4891 Unspecified atrial fibrillation: Secondary | ICD-10-CM | POA: Diagnosis not present

## 2018-11-03 DIAGNOSIS — I252 Old myocardial infarction: Secondary | ICD-10-CM | POA: Diagnosis not present

## 2018-11-03 DIAGNOSIS — I2510 Atherosclerotic heart disease of native coronary artery without angina pectoris: Secondary | ICD-10-CM | POA: Diagnosis not present

## 2018-11-03 DIAGNOSIS — I10 Essential (primary) hypertension: Secondary | ICD-10-CM | POA: Diagnosis not present

## 2018-11-03 DIAGNOSIS — E119 Type 2 diabetes mellitus without complications: Secondary | ICD-10-CM | POA: Diagnosis not present

## 2018-11-05 DIAGNOSIS — I10 Essential (primary) hypertension: Secondary | ICD-10-CM | POA: Diagnosis not present

## 2018-11-05 DIAGNOSIS — F0390 Unspecified dementia without behavioral disturbance: Secondary | ICD-10-CM | POA: Diagnosis not present

## 2018-11-05 DIAGNOSIS — I2510 Atherosclerotic heart disease of native coronary artery without angina pectoris: Secondary | ICD-10-CM | POA: Diagnosis not present

## 2018-11-05 DIAGNOSIS — I4891 Unspecified atrial fibrillation: Secondary | ICD-10-CM | POA: Diagnosis not present

## 2018-11-05 DIAGNOSIS — E119 Type 2 diabetes mellitus without complications: Secondary | ICD-10-CM | POA: Diagnosis not present

## 2018-11-05 DIAGNOSIS — I252 Old myocardial infarction: Secondary | ICD-10-CM | POA: Diagnosis not present

## 2018-11-10 DIAGNOSIS — I4891 Unspecified atrial fibrillation: Secondary | ICD-10-CM | POA: Diagnosis not present

## 2018-11-10 DIAGNOSIS — I252 Old myocardial infarction: Secondary | ICD-10-CM | POA: Diagnosis not present

## 2018-11-10 DIAGNOSIS — F0390 Unspecified dementia without behavioral disturbance: Secondary | ICD-10-CM | POA: Diagnosis not present

## 2018-11-10 DIAGNOSIS — I2510 Atherosclerotic heart disease of native coronary artery without angina pectoris: Secondary | ICD-10-CM | POA: Diagnosis not present

## 2018-11-10 DIAGNOSIS — I10 Essential (primary) hypertension: Secondary | ICD-10-CM | POA: Diagnosis not present

## 2018-11-10 DIAGNOSIS — E119 Type 2 diabetes mellitus without complications: Secondary | ICD-10-CM | POA: Diagnosis not present

## 2018-11-12 DIAGNOSIS — I252 Old myocardial infarction: Secondary | ICD-10-CM | POA: Diagnosis not present

## 2018-11-12 DIAGNOSIS — E119 Type 2 diabetes mellitus without complications: Secondary | ICD-10-CM | POA: Diagnosis not present

## 2018-11-12 DIAGNOSIS — I10 Essential (primary) hypertension: Secondary | ICD-10-CM | POA: Diagnosis not present

## 2018-11-12 DIAGNOSIS — F0390 Unspecified dementia without behavioral disturbance: Secondary | ICD-10-CM | POA: Diagnosis not present

## 2018-11-12 DIAGNOSIS — I4891 Unspecified atrial fibrillation: Secondary | ICD-10-CM | POA: Diagnosis not present

## 2018-11-12 DIAGNOSIS — I2510 Atherosclerotic heart disease of native coronary artery without angina pectoris: Secondary | ICD-10-CM | POA: Diagnosis not present

## 2018-11-16 DIAGNOSIS — I2510 Atherosclerotic heart disease of native coronary artery without angina pectoris: Secondary | ICD-10-CM | POA: Diagnosis not present

## 2018-11-16 DIAGNOSIS — F0390 Unspecified dementia without behavioral disturbance: Secondary | ICD-10-CM | POA: Diagnosis not present

## 2018-11-16 DIAGNOSIS — E119 Type 2 diabetes mellitus without complications: Secondary | ICD-10-CM | POA: Diagnosis not present

## 2018-11-16 DIAGNOSIS — I252 Old myocardial infarction: Secondary | ICD-10-CM | POA: Diagnosis not present

## 2018-11-16 DIAGNOSIS — I4891 Unspecified atrial fibrillation: Secondary | ICD-10-CM | POA: Diagnosis not present

## 2018-11-16 DIAGNOSIS — I10 Essential (primary) hypertension: Secondary | ICD-10-CM | POA: Diagnosis not present

## 2018-11-19 DIAGNOSIS — E119 Type 2 diabetes mellitus without complications: Secondary | ICD-10-CM | POA: Diagnosis not present

## 2018-11-19 DIAGNOSIS — Z6827 Body mass index (BMI) 27.0-27.9, adult: Secondary | ICD-10-CM | POA: Diagnosis not present

## 2018-11-19 DIAGNOSIS — I252 Old myocardial infarction: Secondary | ICD-10-CM | POA: Diagnosis not present

## 2018-11-19 DIAGNOSIS — I2510 Atherosclerotic heart disease of native coronary artery without angina pectoris: Secondary | ICD-10-CM | POA: Diagnosis not present

## 2018-11-19 DIAGNOSIS — I10 Essential (primary) hypertension: Secondary | ICD-10-CM | POA: Diagnosis not present

## 2018-11-19 DIAGNOSIS — Z96643 Presence of artificial hip joint, bilateral: Secondary | ICD-10-CM | POA: Diagnosis not present

## 2018-11-19 DIAGNOSIS — J40 Bronchitis, not specified as acute or chronic: Secondary | ICD-10-CM | POA: Diagnosis not present

## 2018-11-19 DIAGNOSIS — E669 Obesity, unspecified: Secondary | ICD-10-CM | POA: Diagnosis not present

## 2018-11-19 DIAGNOSIS — I4891 Unspecified atrial fibrillation: Secondary | ICD-10-CM | POA: Diagnosis not present

## 2018-11-19 DIAGNOSIS — F0390 Unspecified dementia without behavioral disturbance: Secondary | ICD-10-CM | POA: Diagnosis not present

## 2018-11-19 DIAGNOSIS — Z9181 History of falling: Secondary | ICD-10-CM | POA: Diagnosis not present

## 2018-11-25 DIAGNOSIS — R296 Repeated falls: Secondary | ICD-10-CM | POA: Diagnosis not present

## 2018-11-25 DIAGNOSIS — Z79899 Other long term (current) drug therapy: Secondary | ICD-10-CM | POA: Diagnosis not present

## 2018-11-25 DIAGNOSIS — I2510 Atherosclerotic heart disease of native coronary artery without angina pectoris: Secondary | ICD-10-CM | POA: Diagnosis not present

## 2018-11-25 DIAGNOSIS — I10 Essential (primary) hypertension: Secondary | ICD-10-CM | POA: Diagnosis not present

## 2018-11-25 DIAGNOSIS — I252 Old myocardial infarction: Secondary | ICD-10-CM | POA: Diagnosis not present

## 2018-11-25 DIAGNOSIS — I4891 Unspecified atrial fibrillation: Secondary | ICD-10-CM | POA: Diagnosis not present

## 2018-11-25 DIAGNOSIS — N400 Enlarged prostate without lower urinary tract symptoms: Secondary | ICD-10-CM | POA: Diagnosis not present

## 2018-11-25 DIAGNOSIS — G4730 Sleep apnea, unspecified: Secondary | ICD-10-CM | POA: Diagnosis not present

## 2018-11-25 DIAGNOSIS — R441 Visual hallucinations: Secondary | ICD-10-CM | POA: Diagnosis not present

## 2018-11-25 DIAGNOSIS — L570 Actinic keratosis: Secondary | ICD-10-CM | POA: Diagnosis not present

## 2018-11-25 DIAGNOSIS — Z1389 Encounter for screening for other disorder: Secondary | ICD-10-CM | POA: Diagnosis not present

## 2018-11-25 DIAGNOSIS — Z0000 Encounter for general adult medical examination without abnormal findings: Secondary | ICD-10-CM | POA: Diagnosis not present

## 2018-11-25 DIAGNOSIS — M109 Gout, unspecified: Secondary | ICD-10-CM | POA: Diagnosis not present

## 2018-11-25 DIAGNOSIS — G3184 Mild cognitive impairment, so stated: Secondary | ICD-10-CM | POA: Diagnosis not present

## 2019-01-06 IMAGING — CT CT CERVICAL SPINE W/O CM
4 of 11 series · 8 of 33 positions shown, 9 images · non-contrast
Comparison: 08/29/2018 head and cervical spine CT.

CLINICAL DATA: Fall with facial trauma

EXAM:
CT HEAD WITHOUT CONTRAST
CT MAXILLOFACIAL WITHOUT CONTRAST
CT CERVICAL SPINE WITHOUT CONTRAST
TECHNIQUE: Multidetector CT imaging of the head, cervical spine, and
maxillofacial structures were performed using the standard protocol
without intravenous contrast. Multiplanar CT image reconstructions
of the cervical spine and maxillofacial structures were also
generated.

[Series 6: max soft · axial · 0.32mm/px · z∈[-263,-199]mm · 2 of 96 slices shown]
[im 32/96  soft-tissue]
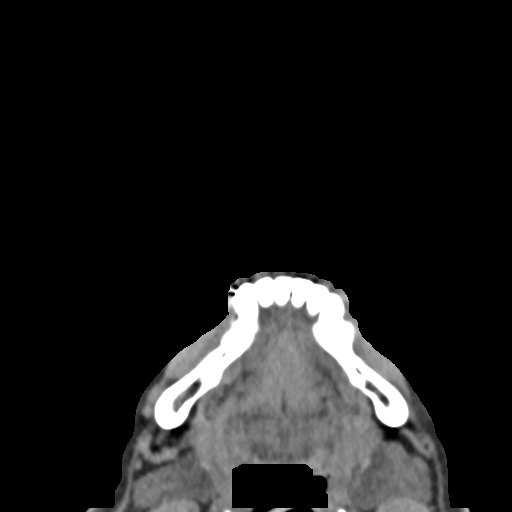
[im 64/96  soft-tissue]
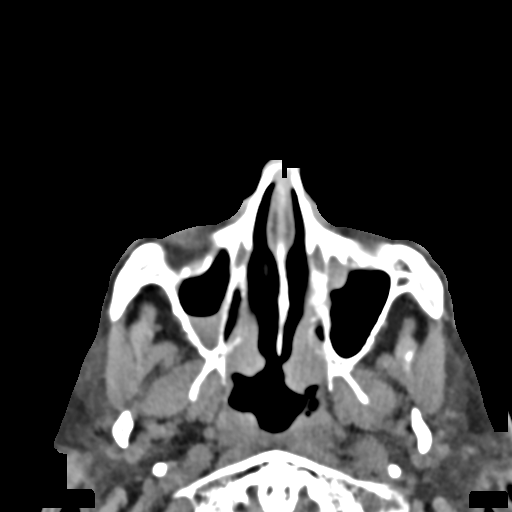

[Series 8: max bone · axial · 0.72mm/px · z∈[-304,-136]mm · 3 of 85 slices shown, 4 images]
[im 1/85  soft-tissue]
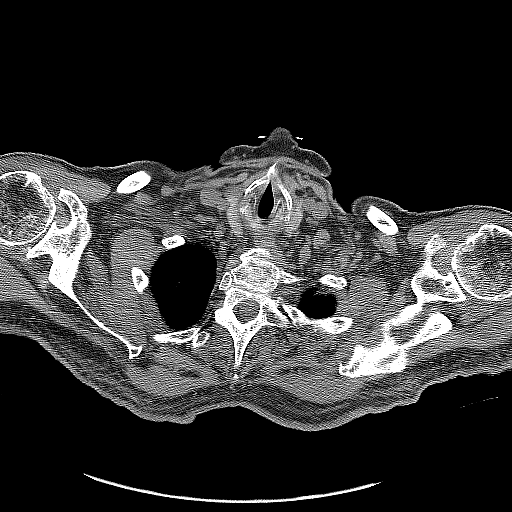
[im 1/85  bone]
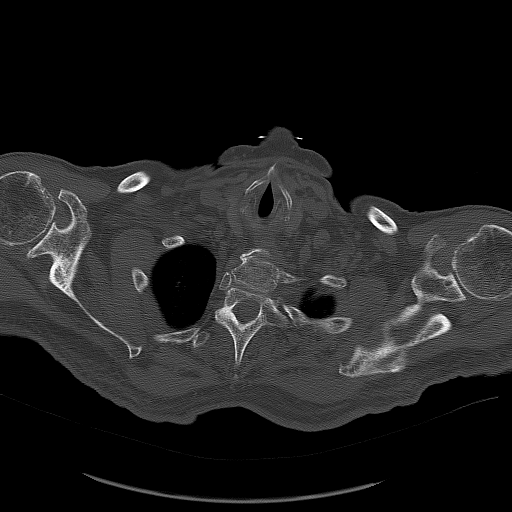
[im 43/85  bone]
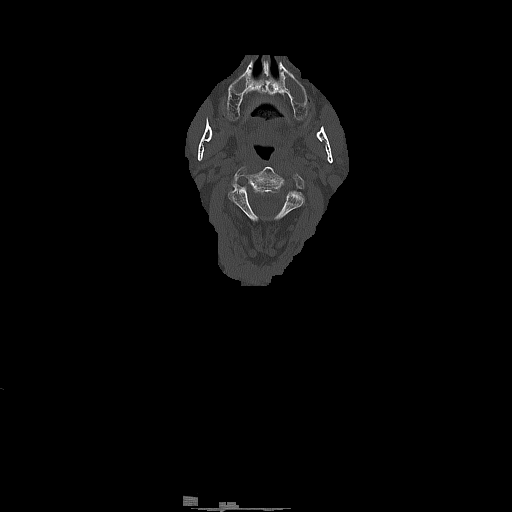
[im 85/85  bone]
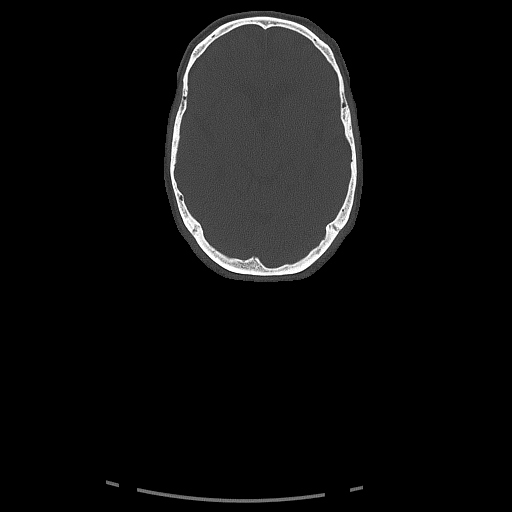

[Series 17: sagittal bone · sagittal · 0.23mm/px · 2 of 71 slices shown]
[im 24/71  bone]
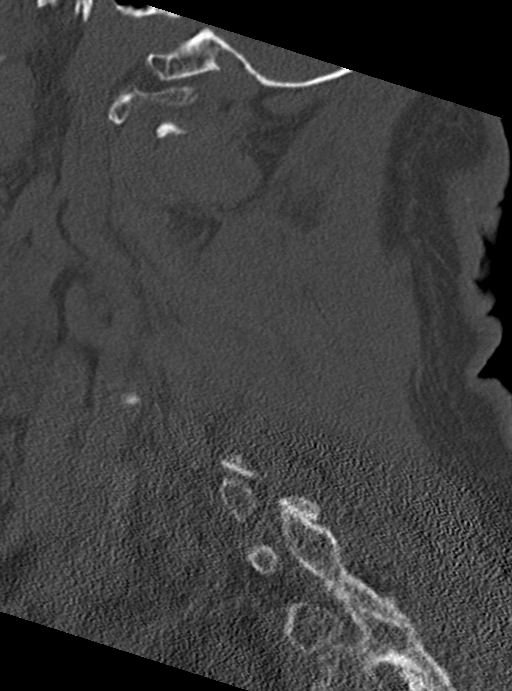
[im 47/71  bone]
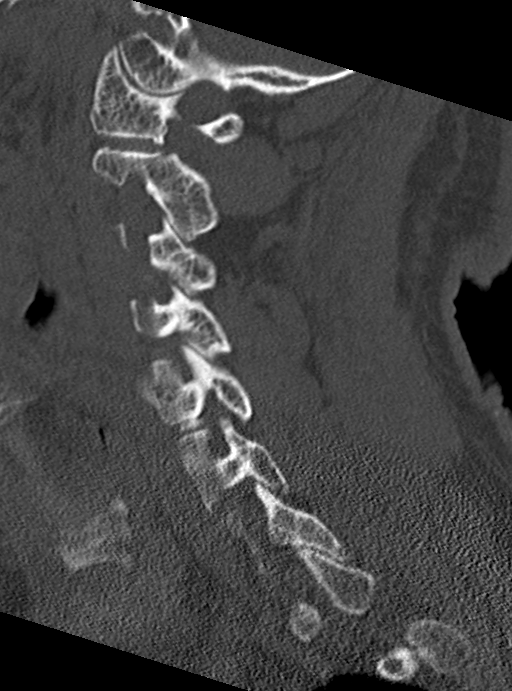

[Series 18: coronal bone · coronal · 0.27mm/px · 1 of 65 slices shown]
[im 47/65  bone]
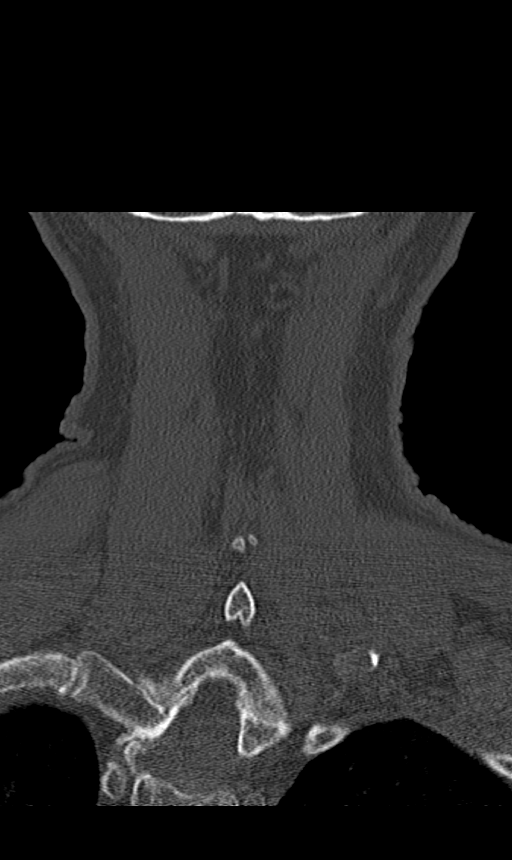

[8 of 33 positions shown; findings below may reference images not displayed]

FINDINGS: CT HEAD FINDINGS

Brain: No evidence of acute infarction, hemorrhage, hydrocephalus,
extra-axial collection or mass lesion/mass effect. Cerebral volume
loss and mild-to-moderate chronic small vessel ischemia in the
cerebral white matter.

Vascular: Atherosclerotic calcification

Skull: No acute fracture.

CT MAXILLOFACIAL FINDINGS

Osseous: Negative for fracture or mandibular dislocation.

Orbits: No evidence of injury.  Bilateral cataract resection

Sinuses: Patchy mucosal thickening in the paranasal sinuses with
fluid level in the right maxillary sinus. These changes are new from
prior

Soft tissues: No noted hematoma or opaque foreign body.

CT CERVICAL SPINE FINDINGS

Alignment: Levo scoliotic curvature centered at the cervicothoracic
junction

Skull base and vertebrae: No acute fracture. T1 superior endplate
fracture is chronic based on prior and 02/08/2014 cervical spine CT.

Soft tissues and spinal canal: No prevertebral fluid or swelling. No
visible canal hematoma.

Disc levels: Mild degenerative changes for age. No evident
impingement.

Upper chest: Negative
IMPRESSION: 1. No evidence of acute intracranial or cervical spine injury.
Negative for facial fracture.
2. Sinusitis that is new from August 2018 CT, including right
maxillary fluid level.

## 2019-02-03 ENCOUNTER — Ambulatory Visit (INDEPENDENT_AMBULATORY_CARE_PROVIDER_SITE_OTHER): Payer: Medicare Other | Admitting: Podiatry

## 2019-02-03 ENCOUNTER — Encounter: Payer: Self-pay | Admitting: Podiatry

## 2019-02-03 ENCOUNTER — Other Ambulatory Visit: Payer: Self-pay

## 2019-02-03 DIAGNOSIS — M79676 Pain in unspecified toe(s): Secondary | ICD-10-CM | POA: Diagnosis not present

## 2019-02-03 DIAGNOSIS — D689 Coagulation defect, unspecified: Secondary | ICD-10-CM | POA: Diagnosis not present

## 2019-02-03 DIAGNOSIS — B351 Tinea unguium: Secondary | ICD-10-CM

## 2019-02-03 NOTE — Progress Notes (Signed)
Patient ID: Jonathan [name], male   DOB: 1931/05/22, 85 y.o.   MRN: [id number] Complaint:  Visit Type: Patient returns to my office for continued preventative foot care services. Complaint: Patient states" my nails have grown long and thick and become painful to walk and wear shoes". The patient presents for preventative foot care services. No changes to ROS.  Patient on eliquiss. Patient has been diagnosed with Diabetes. Podiatric Exam: Vascular: dorsalis pedis and posterior tibial pulses are palpable bilateral. Capillary return is immediate. Temperature gradient is WNL. Skin turgor WNL  Sensorium: Normal Semmes Weinstein monofilament test. Normal tactile sensation bilaterally. Nail Exam: Pt has thick disfigured discolored nails with subungual debris noted bilateral entire nail hallux through fifth toenails Ulcer Exam: There is no evidence of ulcer or pre-ulcerative changes or infection. Orthopedic Exam: Muscle tone and strength are WNL. No limitations in general ROM. No crepitus or effusions noted. Foot type and digits show no abnormalities. Bony prominences are unremarkable. Skin: No Porokeratosis. No infection or ulcers  Diagnosis:  Onychomycosis, , Pain in right toe, pain in left toes  Treatment & Plan Procedures and Treatment: Consent by patient was obtained for treatment procedures. The patient understood the discussion of treatment and procedures well. All questions were answered thoroughly reviewed. Debridement of mycotic and hypertrophic toenails, 1 through 5 bilateral and clearing of subungual debris. No ulceration, no infection noted.   Return Visit-Office Procedure: Patient instructed to return to the office for a follow up visit 3 months for continued evaluation and treatment.   Gardiner Barefoot DPM

## 2019-02-05 DIAGNOSIS — J40 Bronchitis, not specified as acute or chronic: Secondary | ICD-10-CM | POA: Diagnosis not present

## 2019-02-05 DIAGNOSIS — N400 Enlarged prostate without lower urinary tract symptoms: Secondary | ICD-10-CM | POA: Diagnosis not present

## 2019-02-05 DIAGNOSIS — I4891 Unspecified atrial fibrillation: Secondary | ICD-10-CM | POA: Diagnosis not present

## 2019-02-05 DIAGNOSIS — I10 Essential (primary) hypertension: Secondary | ICD-10-CM | POA: Diagnosis not present

## 2019-02-05 DIAGNOSIS — I25119 Atherosclerotic heart disease of native coronary artery with unspecified angina pectoris: Secondary | ICD-10-CM | POA: Diagnosis not present

## 2019-02-05 DIAGNOSIS — I209 Angina pectoris, unspecified: Secondary | ICD-10-CM | POA: Diagnosis not present

## 2019-02-05 DIAGNOSIS — E785 Hyperlipidemia, unspecified: Secondary | ICD-10-CM | POA: Diagnosis not present

## 2019-02-05 DIAGNOSIS — I252 Old myocardial infarction: Secondary | ICD-10-CM | POA: Diagnosis not present

## 2019-02-05 DIAGNOSIS — I2510 Atherosclerotic heart disease of native coronary artery without angina pectoris: Secondary | ICD-10-CM | POA: Diagnosis not present

## 2019-02-05 DIAGNOSIS — E119 Type 2 diabetes mellitus without complications: Secondary | ICD-10-CM | POA: Diagnosis not present

## 2019-03-16 DIAGNOSIS — B356 Tinea cruris: Secondary | ICD-10-CM | POA: Diagnosis not present

## 2019-03-16 DIAGNOSIS — L821 Other seborrheic keratosis: Secondary | ICD-10-CM | POA: Diagnosis not present

## 2019-03-16 DIAGNOSIS — Z85828 Personal history of other malignant neoplasm of skin: Secondary | ICD-10-CM | POA: Diagnosis not present

## 2019-03-16 DIAGNOSIS — L570 Actinic keratosis: Secondary | ICD-10-CM | POA: Diagnosis not present

## 2019-03-16 DIAGNOSIS — D485 Neoplasm of uncertain behavior of skin: Secondary | ICD-10-CM | POA: Diagnosis not present

## 2019-03-16 DIAGNOSIS — B353 Tinea pedis: Secondary | ICD-10-CM | POA: Diagnosis not present

## 2019-03-16 DIAGNOSIS — D045 Carcinoma in situ of skin of trunk: Secondary | ICD-10-CM | POA: Diagnosis not present

## 2019-04-12 DIAGNOSIS — G4733 Obstructive sleep apnea (adult) (pediatric): Secondary | ICD-10-CM | POA: Diagnosis not present

## 2019-04-21 ENCOUNTER — Other Ambulatory Visit: Payer: Self-pay

## 2019-04-21 DIAGNOSIS — I10 Essential (primary) hypertension: Secondary | ICD-10-CM

## 2019-04-21 DIAGNOSIS — I2510 Atherosclerotic heart disease of native coronary artery without angina pectoris: Secondary | ICD-10-CM

## 2019-04-22 MED ORDER — APIXABAN 5 MG PO TABS: 5.0000 mg | ORAL_TABLET | Freq: Two times a day (BID) | ORAL | 5 refills | Status: DC

## 2019-04-22 NOTE — Telephone Encounter (Signed)
Prescription refill request for Eliquis received.  Last office visit: 12-23-2019Daune Perch Scr:  1.12, 08-29-2018 Age:  85 y.o. Weight: 82.9 kg (10-05-2018)  Prescription refill sent.

## 2019-05-05 ENCOUNTER — Ambulatory Visit (INDEPENDENT_AMBULATORY_CARE_PROVIDER_SITE_OTHER): Payer: Medicare Other | Admitting: Podiatry

## 2019-05-05 ENCOUNTER — Encounter: Payer: Self-pay | Admitting: Podiatry

## 2019-05-05 ENCOUNTER — Other Ambulatory Visit: Payer: Self-pay

## 2019-05-05 VITALS — Temp 98.1°F

## 2019-05-05 DIAGNOSIS — D689 Coagulation defect, unspecified: Secondary | ICD-10-CM

## 2019-05-05 DIAGNOSIS — M79676 Pain in unspecified toe(s): Secondary | ICD-10-CM | POA: Diagnosis not present

## 2019-05-05 DIAGNOSIS — E119 Type 2 diabetes mellitus without complications: Secondary | ICD-10-CM

## 2019-05-05 DIAGNOSIS — B351 Tinea unguium: Secondary | ICD-10-CM | POA: Diagnosis not present

## 2019-05-05 NOTE — Progress Notes (Signed)
Patient ID: Jonathan [name], male   DOB: 02-22-1931, 85 y.o.   MRN: [id number] Complaint:  Visit Type: Patient returns to my office for continued preventative foot care services. Complaint: Patient states" my nails have grown long and thick and become painful to walk and wear shoes". The patient presents for preventative foot care services. No changes to ROS.  Patient on eliquiss. Patient has been diagnosed with Diabetes. Podiatric Exam: Vascular: dorsalis pedis and posterior tibial pulses are palpable bilateral. Capillary return is immediate. Temperature gradient is WNL. Skin turgor WNL  Sensorium: Normal Semmes Weinstein monofilament test. Normal tactile sensation bilaterally. Nail Exam: Pt has thick disfigured discolored nails with subungual debris noted bilateral entire nail hallux through fifth toenails Ulcer Exam: There is no evidence of ulcer or pre-ulcerative changes or infection. Orthopedic Exam: Muscle tone and strength are WNL. No limitations in general ROM. No crepitus or effusions noted. Foot type and digits show no abnormalities. Bony prominences are unremarkable. Skin: No Porokeratosis. No infection or ulcers  Diagnosis:  Onychomycosis, , Pain in right toe, pain in left toes  Treatment & Plan Procedures and Treatment: Consent by patient was obtained for treatment procedures. The patient understood the discussion of treatment and procedures well. All questions were answered thoroughly reviewed. Debridement of mycotic and hypertrophic toenails, 1 through 5 bilateral and clearing of subungual debris. No ulceration, no infection noted.   Return Visit-Office Procedure: Patient instructed to return to the office for a follow up visit 3 months for continued evaluation and treatment.   Gardiner Barefoot DPM

## 2019-05-21 DIAGNOSIS — M25551 Pain in right hip: Secondary | ICD-10-CM | POA: Insufficient documentation

## 2019-06-01 DIAGNOSIS — R296 Repeated falls: Secondary | ICD-10-CM | POA: Diagnosis not present

## 2019-06-01 DIAGNOSIS — R259 Unspecified abnormal involuntary movements: Secondary | ICD-10-CM | POA: Diagnosis not present

## 2019-06-01 DIAGNOSIS — R413 Other amnesia: Secondary | ICD-10-CM | POA: Diagnosis not present

## 2019-06-01 DIAGNOSIS — R441 Visual hallucinations: Secondary | ICD-10-CM | POA: Diagnosis not present

## 2019-06-01 DIAGNOSIS — I10 Essential (primary) hypertension: Secondary | ICD-10-CM | POA: Diagnosis not present

## 2019-06-01 DIAGNOSIS — I4891 Unspecified atrial fibrillation: Secondary | ICD-10-CM | POA: Diagnosis not present

## 2019-06-01 DIAGNOSIS — I25119 Atherosclerotic heart disease of native coronary artery with unspecified angina pectoris: Secondary | ICD-10-CM | POA: Diagnosis not present

## 2019-06-01 DIAGNOSIS — E119 Type 2 diabetes mellitus without complications: Secondary | ICD-10-CM | POA: Diagnosis not present

## 2019-06-01 DIAGNOSIS — M109 Gout, unspecified: Secondary | ICD-10-CM | POA: Diagnosis not present

## 2019-07-01 DIAGNOSIS — N400 Enlarged prostate without lower urinary tract symptoms: Secondary | ICD-10-CM | POA: Diagnosis not present

## 2019-07-01 DIAGNOSIS — I4891 Unspecified atrial fibrillation: Secondary | ICD-10-CM | POA: Diagnosis not present

## 2019-07-01 DIAGNOSIS — I209 Angina pectoris, unspecified: Secondary | ICD-10-CM | POA: Diagnosis not present

## 2019-07-01 DIAGNOSIS — I252 Old myocardial infarction: Secondary | ICD-10-CM | POA: Diagnosis not present

## 2019-07-01 DIAGNOSIS — I25119 Atherosclerotic heart disease of native coronary artery with unspecified angina pectoris: Secondary | ICD-10-CM | POA: Diagnosis not present

## 2019-07-01 DIAGNOSIS — I10 Essential (primary) hypertension: Secondary | ICD-10-CM | POA: Diagnosis not present

## 2019-07-01 DIAGNOSIS — J40 Bronchitis, not specified as acute or chronic: Secondary | ICD-10-CM | POA: Diagnosis not present

## 2019-07-01 DIAGNOSIS — E785 Hyperlipidemia, unspecified: Secondary | ICD-10-CM | POA: Diagnosis not present

## 2019-07-01 DIAGNOSIS — I2510 Atherosclerotic heart disease of native coronary artery without angina pectoris: Secondary | ICD-10-CM | POA: Diagnosis not present

## 2019-07-01 DIAGNOSIS — E119 Type 2 diabetes mellitus without complications: Secondary | ICD-10-CM | POA: Diagnosis not present

## 2019-07-14 ENCOUNTER — Ambulatory Visit (INDEPENDENT_AMBULATORY_CARE_PROVIDER_SITE_OTHER): Payer: Medicare Other | Admitting: Neurology

## 2019-07-14 ENCOUNTER — Encounter: Payer: Self-pay | Admitting: Neurology

## 2019-07-14 ENCOUNTER — Other Ambulatory Visit: Payer: Self-pay

## 2019-07-14 VITALS — BP 160/72 | HR 60 | Ht 67.0 in | Wt 170.0 lb

## 2019-07-14 DIAGNOSIS — I2510 Atherosclerotic heart disease of native coronary artery without angina pectoris: Secondary | ICD-10-CM

## 2019-07-14 DIAGNOSIS — F0390 Unspecified dementia without behavioral disturbance: Secondary | ICD-10-CM | POA: Diagnosis not present

## 2019-07-14 NOTE — Progress Notes (Signed)
Subjective:    Patient ID: Jonathan [name] is a 85 y.o. male.  HPI     Interim history:  Jonathan [name] is an 85 year old right-handed gentleman with an underlying complex medical history of hypertension, A. fib, coronary artery disease, arthritis, borderline diabetes, reflux disease, status post multiple surgeries including bilateral hip arthroplasties, hernia repairs, cataract surgeries, corneal transplant, multiple EGDs, and mildly overweight state, and sleep apnea, who presents for a new problem visit for memory loss.  The patient is accompanied by his Jonathan [name] today.   I previously evaluated him for gait disorder and concern for parkinsonism at the request of his primary care physician on 07/14/2017.  I did not see any telltale signs of parkinsonism.  He had fallen, he was not using his walker at the time and was encouraged to consistently use his walker due to fall risk.  I suggested we proceed with a brain MRI.  He had a brain MRI without contrast on 07/29/2017 and I reviewed the results:   IMPRESSION:  Slightly abnormal MRI scan of brain showing age appropriate changes of mild chronic microvascular ischemia and generalized cerebral atrophy.     We called the patient and his Jonathan [name] at the time with the results.  Today, 07/14/2019: He reports very little on his own, his history is provided by his Jonathan [name].  He has had memory loss for some years.  He has had no major behavioral changes but had some visual hallucinations.  He was placed on Seroquel by his primary care physician and also started on donepezil recently 5 mg strength.  She started to give him the donepezil on 06/27/2019.  He seems to tolerate it well.  He does not hydrate well with water, he estimates that he drinks water with meals only and typically not in between.  He reports that he has to go to the bathroom too much and too quickly after drinking anything.  He has not fallen recently.  He does not typically use his walker.  He uses CPAP at  night, has an appointment with Jonathan [name] next month.  The patient's allergies, current medications, family history, past medical history, past social history, past surgical history and problem list were reviewed and updated as appropriate.   Previously:   07/14/2017: (He) reports recent difficulty with maneuvering stairs, he had difficulty finishing sentences and felt that he was drooling more. He is worried that he may have had a stroke. He has memory loss for the past year or longer. I reviewed your office note from 06/02/2017. He had blood work at the time including B12 and TSH. He also had an A1c and BMP check at the time, we will request test results from your office., Which you kindly included. His Jonathan [name] reports that he has had motor problems for over one year, and attributes some of this to fatigue and due to new onset a fib. This was noted in May, after a recent procedure of esophageal dilatation. He did not require cardioversion, but has had PAF.  He has fallen, has a walker, but does not use it. Jonathan [name] has taken over the driving in the past month, as he recently got confused inside his neighborhood.  During a recent ER visit she says, his heart rate was in the 40s.   He had a recent [place] wo contrast on 05/23/17, which I reviewed: IMPRESSION: No acute finding or explanation for symptoms. He had a high cervical Fx some years ago, saw Dr. Joya Salm at the time.  With respect to speech and gait instability, he and his Jonathan [name] report that he is better. He has new hearing aids too.     His Past Medical History Is Significant For: Past Medical History:  Diagnosis Date  . Acne rosacea   . Arthritis   . Atrial fibrillation ([place])   . Borderline diabetes   . CAD (coronary artery disease)   . Diabetes mellitus without complication ([place])   . Diverticulosis   . Elevated PSA   . Esophageal stricture   . GERD (gastroesophageal reflux disease)   . History of nuclear stress test    Nuclear stress test  6/18: EF 59, small apical defect (?old MI); no ischemia; Low Risk  . Hypertension   . Myocardial infarction ([place])   . Neck fracture ([place]) 2015  . PONV (postoperative nausea and vomiting) 08/09/2014  . Shoulder pain     His Past Surgical History Is Significant For: Past Surgical History:  Procedure Laterality Date  . BALLOON DILATION  04/02/2012   Procedure: BALLOON DILATION;  Surgeon: Arta Silence, MD;  Location: [facility] ENDOSCOPY;  Service: Endoscopy;  Laterality: N/A;  . BALLOON DILATION N/A 06/16/2013   Procedure: BALLOON DILATION;  Surgeon: Arta Silence, MD;  Location: [facility];  Service: Endoscopy;  Laterality: N/A;  . BALLOON DILATION N/A 11/16/2014   Procedure: BALLOON DILATION;  Surgeon: Arta Silence, MD;  Location: [facility];  Service: Endoscopy;  Laterality: N/A;  . BALLOON DILATION N/A 02/13/2017   Procedure: BALLOON DILATION;  Surgeon: Arta Silence, MD;  Location: [facility] ENDOSCOPY;  Service: Endoscopy;  Laterality: N/A;  . CORNEAL TRANSPLANT    . ESOPHAGOGASTRODUODENOSCOPY  04/02/2012   Procedure: ESOPHAGOGASTRODUODENOSCOPY (EGD);  Surgeon: Arta Silence, MD;  Location: [facility];  Service: Endoscopy;  Laterality: N/A;  . ESOPHAGOGASTRODUODENOSCOPY N/A 05/28/2013   Procedure: ESOPHAGOGASTRODUODENOSCOPY (EGD);  Surgeon: Cleotis Nipper, MD;  Location: [facility];  Service: Endoscopy;  Laterality: N/A;  . ESOPHAGOGASTRODUODENOSCOPY N/A 02/20/2015   Procedure: ESOPHAGOGASTRODUODENOSCOPY (EGD);  Surgeon: Arta Silence, MD;  Location: [facility];  Service: Endoscopy;  Laterality: N/A;  . ESOPHAGOGASTRODUODENOSCOPY (EGD) WITH PROPOFOL N/A 06/16/2013   Procedure: ESOPHAGOGASTRODUODENOSCOPY (EGD) WITH PROPOFOL;  Surgeon: Arta Silence, MD;  Location: [facility];  Service: Endoscopy;  Laterality: N/A;  . ESOPHAGOGASTRODUODENOSCOPY (EGD) WITH PROPOFOL N/A 11/16/2014   Procedure: ESOPHAGOGASTRODUODENOSCOPY (EGD) WITH PROPOFOL;  Surgeon: Arta Silence, MD;  Location: [facility];   Service: Endoscopy;  Laterality: N/A;  . ESOPHAGOGASTRODUODENOSCOPY (EGD) WITH PROPOFOL N/A 02/13/2017   Procedure: ESOPHAGOGASTRODUODENOSCOPY (EGD) WITH PROPOFOL;  Surgeon: Arta Silence, MD;  Location: [facility];  Service: Endoscopy;  Laterality: N/A;  . EYE SURGERY     cataract sx  . HERNIA REPAIR     x 2  . TOTAL HIP ARTHROPLASTY     bilateral    His Family History Is Significant For: Family History  Family history unknown: Yes    His Social History Is Significant For: Social History   Socioeconomic History  . Marital status: Married    Spouse name: Not on file  . Number of children: Not on file  . Years of education: Not on file  . Highest education level: Not on file  Occupational History  . Not on file  Social Needs  . Financial resource strain: Not on file  . Food insecurity    Worry: Not on file    Inability: Not on file  . Transportation needs    Medical: Not on file    Non-medical: Not on file  Tobacco Use  .  Smoking status: Never Smoker  . Smokeless tobacco: Never Used  Substance and Sexual Activity  . Alcohol use: No  . Drug use: No  . Sexual activity: Not on file  Lifestyle  . Physical activity    Days per week: Not on file    Minutes per session: Not on file  . Stress: Not on file  Relationships  . Social Herbalist on phone: Not on file    Gets together: Not on file    Attends religious service: Not on file    Active member of club or organization: Not on file    Attends meetings of clubs or organizations: Not on file    Relationship status: Not on file  Other Topics Concern  . Not on file  Social History Narrative  . Not on file    His Allergies Are:  No Known Allergies:   His Current Medications Are:  Outpatient Encounter Medications as of 07/14/2019  Medication Sig  . amLODipine (NORVASC) 5 MG tablet Take 5 mg by mouth daily.  Marland Kitchen apixaban (ELIQUIS) 5 MG TABS tablet Take 1 tablet (5 mg total) by mouth 2 (two) times  daily.  Marland Kitchen atorvastatin (LIPITOR) 10 MG tablet Take 10 mg tablet by mouth Monday, Wednesday and friday  . colchicine 0.6 MG tablet TK 1 T PO Q 12 H PRF GOUT FLARE  . docusate sodium (STOOL SOFTENER) 100 MG capsule Take 200 mg by mouth at bedtime.  . donepezil (ARICEPT) 5 MG tablet Take 5 mg by mouth at bedtime.  . finasteride (PROSCAR) 5 MG tablet Take 5 mg by mouth every morning.   . halobetasol (ULTRAVATE) 0.05 % ointment Apply 1 application topically 2 (two) times daily as needed (itching or swelling). APPLY TWICE DAILY TO HANDS  . isosorbide mononitrate (IMDUR) 30 MG 24 hr tablet Take 30 mg by mouth daily with lunch.   . loratadine (ALLERGY) 10 MG tablet Take 10 mg by mouth at bedtime.  Marland Kitchen losartan (COZAAR) 50 MG tablet Take 50 mg by mouth at bedtime.   Marland Kitchen NITROSTAT 0.4 MG SL tablet Place 1 tablet (0.4 mg total) under the tongue every 5 (five) minutes as needed for chest pain. AS NEEDED FOR CHEST PAIN  . omeprazole (PRILOSEC) 20 MG capsule Take 20 mg by mouth daily.  . QUEtiapine (SEROQUEL) 25 MG tablet TK 1 T PO QD HS  . [DISCONTINUED] azithromycin (ZITHROMAX) 250 MG tablet TK 2 TS PO FOR 1 DAY THEN TK 1 T PO D FOR 4 DAYS  . [DISCONTINUED] simethicone (MYLICON) 80 MG chewable tablet Chew 2 tablets (160 mg total) by mouth 4 (four) times daily as needed for flatulence.   No facility-administered encounter medications on file as of 07/14/2019.   :  Review of Systems:  Out of a complete 14 point review of systems, all are reviewed and negative with the exception of these symptoms as listed below: Review of Systems  Neurological:       Pt presents today to discuss his memory. He feels that his memory has worsened.    Objective:  Neurological Exam  Physical Exam Physical Examination:   Vitals:   07/14/19 1030  BP: (!) 160/72  Pulse: 60    General Examination: The patient is a very pleasant 85 y.o. male in no acute distress. He appears well-developed and well-nourished and well  groomed.   HEENT: Normocephalic, atraumatic, pupils are equal, round and reactive to light, trifocal glasses in place. Extraocular tracking is  good without limitation to gaze excursion or nystagmus noted. Normal smooth pursuit is noted. Hearing is impaired mildly with bilateral hearing aids in place. Face is symmetric with normal facial animation. Speech is clear with no dysarthria noted. There is no hypophonia. There is no lip, neck/head, jaw or voice tremor. Neck is supple with full range of passive and active motion. There are no carotid bruits on auscultation. Oropharynx exam reveals: moderate mouth dryness, adequate dental hygiene, dentures. Mallampati is class II. Tongue protrudes centrally and palate elevates symmetrically.   Chest: Clear to auscultation without wheezing, rhonchi or crackles noted.  Heart: S1+S2+0, slightly irregular, could be PVCs.   Abdomen: Soft, non-tender and non-distended with normal bowel sounds appreciated on auscultation.  Extremities: There is trace pitting edema in the distal lower extremities bilaterally.   Skin: Warm and very dry with chronic sun exposure type changes and age-related changes noted in both forearms and distal legs.   Musculoskeletal: exam reveals no obvious joint deformities, tenderness or joint swelling or erythema.   Neurologically:  Mental status: The patient is awake, alert and oriented in all 4 spheres. His immediate and remote memory, attention, language skills and fund of knowledge are impaired. There is no evidence of aphasia, agnosia, apraxia or anomia. Speech is clear with normal prosody and enunciation. Thought process is linear. Mood is normal and affect is normal.   On 07/14/2017: MMSE: 22/30, CDT: 4/4, AFT: 10/min.   On 07/14/2019: MMSE: 20/30, CDT: 4/4, AFT: 7/min.  Cranial nerves II - XII are as described above under HEENT exam.  Motor exam: Normal bulk, strength and tone is noted. There is no tremor. Romberg is not  tested for safety. Reflexes are 1+ in the upper extremities and absent in the lower extremities. Fine motor skills are globally mildly impaired, he has no resting tremor. Heel-to-shin is not possible. Cerebellar testing: No dysmetria or intention tremor. There is no truncal or gait ataxia.  Sensory exam: intact to light touch in the upper and lower extremities.  Gait, station and balance: He stands with mild difficulty, posture mildly stooped, seems age-appropriate, he walks with preserved arm swing, no shuffling noted. He walks slowly and cautiously.  Balance is mildly impaired, particularly when he turns. Tandem walk is not possible safely.   Assessment and Plan:   In summary, Elvira Brundrett is a very pleasant 85 year old male with an underlying complex medical history of hypertension, A. fib, coronary artery disease, arthritis, borderline diabetes, reflux disease, status post multiple surgeries including bilateral hip arthroplasties, hernia repairs, cataract surgeries, corneal transplant, multiple EGDs, OSA on CPAP, and mildly overweight state, who presents for Evaluation of his memory loss.  He has history of memory loss for at least 3 years.  He has risk factors for vascular dementia.  He is reassured that he does not have any telltale signs of parkinsonism.  He had a brain MRI in 2018.  I do not see a pressing need to repeat it but we can certainly consider it.  I would like to get a more in-depth evaluation with neuropsychology for his memory loss.  He has been started on donepezil about 2 weeks ago by his primary care physician and I suggest that they continue with this.  We talked about the importance of healthy lifestyle and fall prevention.  He is advised to use his 2 wheeled walker at all times for gait safety.  He is furthermore advised to keep a set schedule for sleep, stay well hydrated with water  as he may not be drinking enough water.  I suggested a follow-up in 6 months with a nurse  practitioner.  In the meantime, we will keep him posted as to his neuropsychology appointment.  We will also do some blood work today and call them with the results.  I answered all their questions today and the patient and his Jonathan [name] were in agreement. I spent 25 minutes in total face-to-face time with the patient, more than 50% of which was spent in counseling and coordination of care, reviewing test results, reviewing medication and discussing or reviewing the diagnosis of memory loss, its prognosis and treatment options. Pertinent laboratory and imaging test results that were available during this visit with the patient were reviewed by me and considered in my medical decision making (see chart for details).

## 2019-07-14 NOTE — Patient Instructions (Signed)
Please continue your medication for memory loss, you were started on low-dose Aricept by Dr. Inda Merlin, 5 mg strength, you can continue with this.  We will do some blood work today and also make a referral to for more in-depth memory evaluation through a neuropsychologist, I will make a referral to neuropsychology and they should call you within the next week or 2 for an appointment.  If you do not hear after a week or 2 from the neuropsychology office directly, please call us back.  Please follow-up in 6 months with a nurse practitioner.  We will continue to monitor your memory.  Please try to hydrate better with water as you may not be drinking enough water, 6 to 8 cups/day. Use your 2 wheeled walker at all times for gait safety.

## 2019-07-15 ENCOUNTER — Telehealth: Payer: Self-pay

## 2019-07-15 LAB — COMPREHENSIVE METABOLIC PANEL
ALT: 13 IU/L (ref 0–44)
AST: 23 IU/L (ref 0–40)
Albumin/Globulin Ratio: 1.7 (ref 1.2–2.2)
Albumin: 4.3 g/dL (ref 3.6–4.6)
Alkaline Phosphatase: 87 IU/L (ref 39–117)
BUN/Creatinine Ratio: 18 (ref 10–24)
BUN: 20 mg/dL (ref 8–27)
Bilirubin Total: 0.6 mg/dL (ref 0.0–1.2)
CO2: 25 mmol/L (ref 20–29)
Calcium: 9.5 mg/dL (ref 8.6–10.2)
Chloride: 107 mmol/L — ABNORMAL HIGH (ref 96–106)
Creatinine, Ser: 1.09 mg/dL (ref 0.76–1.27)
GFR calc Af Amer: 71 mL/min/{1.73_m2} (ref >59)
GFR calc non Af Amer: 62 mL/min/{1.73_m2} (ref >59)
Globulin, Total: 2.5 g/dL (ref 1.5–4.5)
Glucose: 91 mg/dL (ref 65–99)
Potassium: 4.3 mmol/L (ref 3.5–5.2)
Sodium: 145 mmol/L — ABNORMAL HIGH (ref 134–144)
Total Protein: 6.8 g/dL (ref 6.0–8.5)

## 2019-07-15 LAB — B12 AND FOLATE PANEL
Folate: 6.7 ng/mL (ref >3.0)
Vitamin B-12: 375 pg/mL (ref 232–1245)

## 2019-07-15 LAB — TSH: TSH: 0.983 u[IU]/mL (ref 0.450–4.500)

## 2019-07-15 LAB — RPR: RPR Ser Ql: NONREACTIVE

## 2019-07-15 LAB — HGB A1C W/O EAG: Hgb A1c MFr Bld: 5.8 % — ABNORMAL HIGH (ref 4.8–5.6)

## 2019-07-15 NOTE — Telephone Encounter (Signed)
-----   Message from [name], MD sent at 07/15/2019 12:54 PM EDT ----- Labs are generally speaking okay, A1c which is a diabetes marker in the prediabetes range.  Also sodium and chloride borderline elevated, nothing to worry about, but I would recommend follow-up as scheduled with primary care physician and repeat blood work as recommended by PCP. pls update pt or wife. Michel Bickers

## 2019-07-15 NOTE — Telephone Encounter (Signed)
I called pt, spoke with pt's wife, [name], per [facility] and advised her of pt's lab results and recommendations. Pt's wife verbalized understanding and will follow up with Dr. Inda Merlin.

## 2019-07-15 NOTE — Progress Notes (Signed)
Labs are generally speaking okay, A1c which is a diabetes marker in the prediabetes range.  Also sodium and chloride borderline elevated, nothing to worry about, but I would recommend follow-up as scheduled with primary care physician and repeat blood work as recommended by PCP. pls update pt or wife. Michel Bickers

## 2019-07-19 ENCOUNTER — Encounter: Payer: Self-pay | Admitting: Psychology

## 2019-07-23 DIAGNOSIS — E785 Hyperlipidemia, unspecified: Secondary | ICD-10-CM | POA: Diagnosis not present

## 2019-07-23 DIAGNOSIS — J40 Bronchitis, not specified as acute or chronic: Secondary | ICD-10-CM | POA: Diagnosis not present

## 2019-07-23 DIAGNOSIS — I25119 Atherosclerotic heart disease of native coronary artery with unspecified angina pectoris: Secondary | ICD-10-CM | POA: Diagnosis not present

## 2019-07-23 DIAGNOSIS — I2510 Atherosclerotic heart disease of native coronary artery without angina pectoris: Secondary | ICD-10-CM | POA: Diagnosis not present

## 2019-07-23 DIAGNOSIS — I209 Angina pectoris, unspecified: Secondary | ICD-10-CM | POA: Diagnosis not present

## 2019-07-23 DIAGNOSIS — I252 Old myocardial infarction: Secondary | ICD-10-CM | POA: Diagnosis not present

## 2019-07-23 DIAGNOSIS — I10 Essential (primary) hypertension: Secondary | ICD-10-CM | POA: Diagnosis not present

## 2019-07-23 DIAGNOSIS — N400 Enlarged prostate without lower urinary tract symptoms: Secondary | ICD-10-CM | POA: Diagnosis not present

## 2019-07-23 DIAGNOSIS — I4891 Unspecified atrial fibrillation: Secondary | ICD-10-CM | POA: Diagnosis not present

## 2019-07-23 DIAGNOSIS — E119 Type 2 diabetes mellitus without complications: Secondary | ICD-10-CM | POA: Diagnosis not present

## 2019-08-04 DIAGNOSIS — Z23 Encounter for immunization: Secondary | ICD-10-CM | POA: Diagnosis not present

## 2019-08-09 DIAGNOSIS — G4733 Obstructive sleep apnea (adult) (pediatric): Secondary | ICD-10-CM | POA: Diagnosis not present

## 2019-08-11 ENCOUNTER — Other Ambulatory Visit: Payer: Self-pay

## 2019-08-11 ENCOUNTER — Encounter: Payer: Self-pay | Admitting: Podiatry

## 2019-08-11 ENCOUNTER — Ambulatory Visit (INDEPENDENT_AMBULATORY_CARE_PROVIDER_SITE_OTHER): Payer: Medicare Other | Admitting: Podiatry

## 2019-08-11 DIAGNOSIS — M79676 Pain in unspecified toe(s): Secondary | ICD-10-CM

## 2019-08-11 DIAGNOSIS — E119 Type 2 diabetes mellitus without complications: Secondary | ICD-10-CM

## 2019-08-11 DIAGNOSIS — B351 Tinea unguium: Secondary | ICD-10-CM

## 2019-08-11 DIAGNOSIS — D689 Coagulation defect, unspecified: Secondary | ICD-10-CM

## 2019-08-11 NOTE — Progress Notes (Signed)
Patient ID: Jonathan [name], male   DOB: 02/18/1931, 85 y.o.   MRN: [id number] Complaint:  Visit Type: Patient returns to my office for continued preventative foot care services. Complaint: Patient states" my nails have grown long and thick and become painful to walk and wear shoes". The patient presents for preventative foot care services. No changes to ROS.  Patient on eliquiss. Patient has been diagnosed with Diabetes. Podiatric Exam: Vascular: dorsalis pedis and posterior tibial pulses are palpable bilateral. Capillary return is immediate. Temperature gradient is WNL. Skin turgor WNL  Sensorium: Normal Semmes Weinstein monofilament test. Normal tactile sensation bilaterally. Nail Exam: Pt has thick disfigured discolored nails with subungual debris noted bilateral entire nail hallux through fifth toenails Ulcer Exam: There is no evidence of ulcer or pre-ulcerative changes or infection. Orthopedic Exam: Muscle tone and strength are WNL. No limitations in general ROM. No crepitus or effusions noted. Foot type and digits show no abnormalities. Bony prominences are unremarkable. Skin: No Porokeratosis. No infection or ulcers  Diagnosis:  Onychomycosis, , Pain in right toe, pain in left toes  Treatment & Plan Procedures and Treatment: Consent by patient was obtained for treatment procedures. The patient understood the discussion of treatment and procedures well. All questions were answered thoroughly reviewed. Debridement of mycotic and hypertrophic toenails, 1 through 5 bilateral and clearing of subungual debris. No ulceration, no infection noted.   Return Visit-Office Procedure: Patient instructed to return to the office for a follow up visit 3 months for continued evaluation and treatment.   Jacqulynn Shappell DPM 

## 2019-08-23 DIAGNOSIS — N401 Enlarged prostate with lower urinary tract symptoms: Secondary | ICD-10-CM | POA: Diagnosis not present

## 2019-08-23 DIAGNOSIS — R350 Frequency of micturition: Secondary | ICD-10-CM | POA: Diagnosis not present

## 2019-08-25 DIAGNOSIS — E119 Type 2 diabetes mellitus without complications: Secondary | ICD-10-CM | POA: Diagnosis not present

## 2019-08-25 DIAGNOSIS — I2510 Atherosclerotic heart disease of native coronary artery without angina pectoris: Secondary | ICD-10-CM | POA: Diagnosis not present

## 2019-08-25 DIAGNOSIS — E785 Hyperlipidemia, unspecified: Secondary | ICD-10-CM | POA: Diagnosis not present

## 2019-08-25 DIAGNOSIS — I25119 Atherosclerotic heart disease of native coronary artery with unspecified angina pectoris: Secondary | ICD-10-CM | POA: Diagnosis not present

## 2019-08-25 DIAGNOSIS — N400 Enlarged prostate without lower urinary tract symptoms: Secondary | ICD-10-CM | POA: Diagnosis not present

## 2019-08-25 DIAGNOSIS — I4891 Unspecified atrial fibrillation: Secondary | ICD-10-CM | POA: Diagnosis not present

## 2019-08-25 DIAGNOSIS — J40 Bronchitis, not specified as acute or chronic: Secondary | ICD-10-CM | POA: Diagnosis not present

## 2019-08-25 DIAGNOSIS — I209 Angina pectoris, unspecified: Secondary | ICD-10-CM | POA: Diagnosis not present

## 2019-08-25 DIAGNOSIS — I252 Old myocardial infarction: Secondary | ICD-10-CM | POA: Diagnosis not present

## 2019-08-25 DIAGNOSIS — I10 Essential (primary) hypertension: Secondary | ICD-10-CM | POA: Diagnosis not present

## 2019-08-26 ENCOUNTER — Other Ambulatory Visit: Payer: Self-pay | Admitting: Nurse Practitioner

## 2019-08-26 DIAGNOSIS — I2510 Atherosclerotic heart disease of native coronary artery without angina pectoris: Secondary | ICD-10-CM

## 2019-08-26 DIAGNOSIS — I10 Essential (primary) hypertension: Secondary | ICD-10-CM

## 2019-08-26 MED ORDER — APIXABAN 5 MG PO TABS: 5.0000 mg | ORAL_TABLET | Freq: Two times a day (BID) | ORAL | 11 refills | Status: DC

## 2019-08-26 MED ORDER — NITROSTAT 0.4 MG SL SUBL: 0.4000 mg | SUBLINGUAL_TABLET | SUBLINGUAL | 6 refills | Status: DC | PRN

## 2019-09-15 DIAGNOSIS — L720 Epidermal cyst: Secondary | ICD-10-CM | POA: Diagnosis not present

## 2019-09-15 DIAGNOSIS — L570 Actinic keratosis: Secondary | ICD-10-CM | POA: Diagnosis not present

## 2019-09-15 DIAGNOSIS — Z85828 Personal history of other malignant neoplasm of skin: Secondary | ICD-10-CM | POA: Diagnosis not present

## 2019-09-15 DIAGNOSIS — L821 Other seborrheic keratosis: Secondary | ICD-10-CM | POA: Diagnosis not present

## 2019-09-28 ENCOUNTER — Other Ambulatory Visit: Payer: Self-pay

## 2019-10-13 DIAGNOSIS — N400 Enlarged prostate without lower urinary tract symptoms: Secondary | ICD-10-CM | POA: Diagnosis not present

## 2019-10-13 DIAGNOSIS — I209 Angina pectoris, unspecified: Secondary | ICD-10-CM | POA: Diagnosis not present

## 2019-10-13 DIAGNOSIS — I4891 Unspecified atrial fibrillation: Secondary | ICD-10-CM | POA: Diagnosis not present

## 2019-10-13 DIAGNOSIS — J40 Bronchitis, not specified as acute or chronic: Secondary | ICD-10-CM | POA: Diagnosis not present

## 2019-10-13 DIAGNOSIS — E785 Hyperlipidemia, unspecified: Secondary | ICD-10-CM | POA: Diagnosis not present

## 2019-10-13 DIAGNOSIS — I2510 Atherosclerotic heart disease of native coronary artery without angina pectoris: Secondary | ICD-10-CM | POA: Diagnosis not present

## 2019-10-13 DIAGNOSIS — E119 Type 2 diabetes mellitus without complications: Secondary | ICD-10-CM | POA: Diagnosis not present

## 2019-10-13 DIAGNOSIS — I252 Old myocardial infarction: Secondary | ICD-10-CM | POA: Diagnosis not present

## 2019-10-13 DIAGNOSIS — I10 Essential (primary) hypertension: Secondary | ICD-10-CM | POA: Diagnosis not present

## 2019-10-13 DIAGNOSIS — I25119 Atherosclerotic heart disease of native coronary artery with unspecified angina pectoris: Secondary | ICD-10-CM | POA: Diagnosis not present

## 2019-10-15 DIAGNOSIS — I252 Old myocardial infarction: Secondary | ICD-10-CM | POA: Insufficient documentation

## 2019-10-15 DIAGNOSIS — M109 Gout, unspecified: Secondary | ICD-10-CM | POA: Insufficient documentation

## 2019-10-15 DIAGNOSIS — D6869 Other thrombophilia: Secondary | ICD-10-CM | POA: Insufficient documentation

## 2019-10-15 DIAGNOSIS — Z7901 Long term (current) use of anticoagulants: Secondary | ICD-10-CM | POA: Insufficient documentation

## 2019-10-29 ENCOUNTER — Other Ambulatory Visit: Payer: Self-pay

## 2019-10-29 DIAGNOSIS — I10 Essential (primary) hypertension: Secondary | ICD-10-CM

## 2019-10-29 DIAGNOSIS — I2510 Atherosclerotic heart disease of native coronary artery without angina pectoris: Secondary | ICD-10-CM

## 2019-10-29 MED ORDER — APIXABAN 5 MG PO TABS: 5.0000 mg | ORAL_TABLET | Freq: Two times a day (BID) | ORAL | 1 refills | Status: DC

## 2019-10-29 NOTE — Telephone Encounter (Signed)
Pt last saw Daune Perch, NP on 10/05/18, pt is overdue for follow-up, pt has appt scheduled with Dr Acie Fredrickson for 11/10/19.  Last labs 07/14/19 Creat 1.09, age 86, weight 77.1kg, based on specified criteria pt is on appropriate dosage of Eliquis 5mg  BID.  Will refill rx.

## 2019-11-02 DIAGNOSIS — E119 Type 2 diabetes mellitus without complications: Secondary | ICD-10-CM | POA: Diagnosis not present

## 2019-11-02 DIAGNOSIS — I25119 Atherosclerotic heart disease of native coronary artery with unspecified angina pectoris: Secondary | ICD-10-CM | POA: Diagnosis not present

## 2019-11-02 DIAGNOSIS — E785 Hyperlipidemia, unspecified: Secondary | ICD-10-CM | POA: Diagnosis not present

## 2019-11-02 DIAGNOSIS — N400 Enlarged prostate without lower urinary tract symptoms: Secondary | ICD-10-CM | POA: Diagnosis not present

## 2019-11-02 DIAGNOSIS — J40 Bronchitis, not specified as acute or chronic: Secondary | ICD-10-CM | POA: Diagnosis not present

## 2019-11-02 DIAGNOSIS — I209 Angina pectoris, unspecified: Secondary | ICD-10-CM | POA: Diagnosis not present

## 2019-11-02 DIAGNOSIS — I4891 Unspecified atrial fibrillation: Secondary | ICD-10-CM | POA: Diagnosis not present

## 2019-11-02 DIAGNOSIS — I2510 Atherosclerotic heart disease of native coronary artery without angina pectoris: Secondary | ICD-10-CM | POA: Diagnosis not present

## 2019-11-02 DIAGNOSIS — I252 Old myocardial infarction: Secondary | ICD-10-CM | POA: Diagnosis not present

## 2019-11-02 DIAGNOSIS — I10 Essential (primary) hypertension: Secondary | ICD-10-CM | POA: Diagnosis not present

## 2019-11-10 ENCOUNTER — Encounter: Payer: Self-pay | Admitting: Cardiovascular Disease

## 2019-11-10 ENCOUNTER — Other Ambulatory Visit: Payer: Self-pay

## 2019-11-10 ENCOUNTER — Ambulatory Visit: Payer: Medicare Other | Admitting: Podiatry

## 2019-11-10 ENCOUNTER — Ambulatory Visit (INDEPENDENT_AMBULATORY_CARE_PROVIDER_SITE_OTHER): Payer: Medicare Other | Admitting: Cardiovascular Disease

## 2019-11-10 VITALS — BP 146/70 | HR 65 | Ht 67.0 in | Wt 171.0 lb

## 2019-11-10 DIAGNOSIS — I10 Essential (primary) hypertension: Secondary | ICD-10-CM | POA: Diagnosis not present

## 2019-11-10 DIAGNOSIS — I480 Paroxysmal atrial fibrillation: Secondary | ICD-10-CM | POA: Diagnosis not present

## 2019-11-10 DIAGNOSIS — I2510 Atherosclerotic heart disease of native coronary artery without angina pectoris: Secondary | ICD-10-CM | POA: Diagnosis not present

## 2019-11-10 LAB — BASIC METABOLIC PANEL
BUN/Creatinine Ratio: 17 (ref 10–24)
BUN: 18 mg/dL (ref 8–27)
CO2: 23 mmol/L (ref 20–29)
Calcium: 9.3 mg/dL (ref 8.6–10.2)
Chloride: 107 mmol/L — ABNORMAL HIGH (ref 96–106)
Creatinine, Ser: 1.04 mg/dL (ref 0.76–1.27)
GFR calc Af Amer: 75 mL/min/{1.73_m2} (ref >59)
GFR calc non Af Amer: 65 mL/min/{1.73_m2} (ref >59)
Glucose: 81 mg/dL (ref 65–99)
Potassium: 4.2 mmol/L (ref 3.5–5.2)
Sodium: 142 mmol/L (ref 134–144)

## 2019-11-10 LAB — CBC
Hematocrit: 39.6 % (ref 37.5–51.0)
Hemoglobin: 13.6 g/dL (ref 13.0–17.7)
MCH: 29.2 pg (ref 26.6–33.0)
MCHC: 34.3 g/dL (ref 31.5–35.7)
MCV: 85 fL (ref 79–97)
Platelets: 176 10*3/uL (ref 150–450)
RBC: 4.65 x10E6/uL (ref 4.14–5.80)
RDW: 13 % (ref 11.6–15.4)
WBC: 6.7 10*3/uL (ref 3.4–10.8)

## 2019-11-10 MED ORDER — APIXABAN 5 MG PO TABS: 5.0000 mg | ORAL_TABLET | Freq: Two times a day (BID) | ORAL | 5 refills | Status: DC

## 2019-11-10 NOTE — Progress Notes (Signed)
Cardiology Office Note   Date:  11/10/2019   ID:  Keysean Granier, DOB May 10, 1931, MRN [id number]  PCP:  Josetta Huddle, MD  Cardiologist:   Mertie Moores, MD   No chief complaint on file.   Problem list 1. Atypical chest pain 2. extreme fatigue  3. essential hypertension 4.  Atrial fibrillation  CHADS2VASC = 4   ( age 86, CAD, HTN) 5.  CAD -  6. Obstructive sleep apnea.     Sjon Sergio is a 86 y.o. male who presents for atypical CP and extreme fatigue. Feels worn out all of the time .  TSH was checked and was reportedly ok.  Had a cardiac cath 20 years ago Tamala Julian )  - has a chronic total occlusion that was treated medically  Had a 2nd cath about 7 years later that was unchanged.  Is retired from truck driving, Chartered loss adjuster, Estate agent.   Nov. 15, 2016:  Kentral was seen several months ago for some episodes of chest discomfort. He has known coronary artery disease. He had a [place] study which was normal.  Feb 14, 2017:  Juandedios is seen back today for newly diagnosed atrial fib which was seen during an endoscopy and esophageal dilatation several days ago.  He is still in atrial fib - cannot tel that his HR is irregularly Has had generalized fatigue for the past year  .  He was seen for an esophageal dilatation several days ago. He was noted have an irregular heart rate in the monitor showed atrial fibrillation. He presents today for further evaluation.  No CP.   Still able to do his normal yard work  without any chest pain or shortness of breath.  Aug. 22, 2018:  Mr. Germer is seen for follow up . Hx of CAD  I saw him in May. He has had paroxysmal atrial fibrillation. He was in sinus rhythm when he saw Richardson Dopp in June.  Event monitor showed 15 beats of NS VT knee was worked into see Pecolia Ades, nurse practitioner, he was complaining of dizziness. The Caduet was stopped at that time and atorvastatin was started ( Amlodipine component DC'd)    He was seen in the ER on Aug. 10 for fatigue .   His HR was found to be in the 40-50 range - thought to be due to his metoprolol .    Metoprolol was stopped and Captopril was increased to 50 mg BID  Was feeling well Started feeling better yesterday  Feeling well today .    September 03, 2017: [place] seems to be feeling a little bit better today.  We changed his captopril to losartan and he seems to be tolerating that fairly well. His blood pressure at home seems to be in the normal range. He is drinking a little bit more water.  Primary MD has changed his atorvastatin to 10 mg M,W,F  April 13, 2018: Joneric is seen today for follow-up of his atrial fibrillation, hypertension, and coronary artery disease. Was hospitalized recently with SBO. Has maintained NSR  Had a repeat sleep study.  Is found to have obstructive sleep apnea and now uses a CPAP machine.  Jan. 27 2021:  Ryerson is seen today .  Has an old CTO ( I assume RCA )  No cp.   Still eats some salty foods.  Is not able to do what he used to do due to generalized weakness.   Also loses his train of thougt  Past  Medical History:  Diagnosis Date  . Acne rosacea   . Arthritis   . Atrial fibrillation ([place])   . Borderline diabetes   . CAD (coronary artery disease)   . Diabetes mellitus without complication ([place])   . Diverticulosis   . Elevated PSA   . Esophageal stricture   . GERD (gastroesophageal reflux disease)   . History of nuclear stress test    Nuclear stress test 6/18: EF 59, small apical defect (?old MI); no ischemia; Low Risk  . Hypertension   . Myocardial infarction ([place])   . Neck fracture ([place]) 2015  . PONV (postoperative nausea and vomiting) 08/09/2014  . Shoulder pain     Past Surgical History:  Procedure Laterality Date  . BALLOON DILATION  04/02/2012   Procedure: BALLOON DILATION;  Surgeon: Arta Silence, MD;  Location: [facility] ENDOSCOPY;  Service: Endoscopy;  Laterality: N/A;  . BALLOON DILATION N/A  06/16/2013   Procedure: BALLOON DILATION;  Surgeon: Arta Silence, MD;  Location: [facility] ENDOSCOPY;  Service: Endoscopy;  Laterality: N/A;  . BALLOON DILATION N/A 11/16/2014   Procedure: BALLOON DILATION;  Surgeon: Arta Silence, MD;  Location: [facility] ENDOSCOPY;  Service: Endoscopy;  Laterality: N/A;  . BALLOON DILATION N/A 02/13/2017   Procedure: BALLOON DILATION;  Surgeon: Arta Silence, MD;  Location[facility] ENDOSCOPY;  Service: Endoscopy;  Laterality: N/A;  . CORNEAL TRANSPLANT    . ESOPHAGOGASTRODUODENOSCOPY  04/02/2012   Procedure: ESOPHAGOGASTRODUODENOSCOPY (EGD);  Surgeon: Arta Silence, MD;  Location: [facility];  Service: Endoscopy;  Laterality: N/A;  . ESOPHAGOGASTRODUODENOSCOPY N/A 05/28/2013   Procedure: ESOPHAGOGASTRODUODENOSCOPY (EGD);  Surgeon: Cleotis Nipper, MD;  Location: [facility];  Service: Endoscopy;  Laterality: N/A;  . ESOPHAGOGASTRODUODENOSCOPY N/A 02/20/2015   Procedure: ESOPHAGOGASTRODUODENOSCOPY (EGD);  Surgeon: Arta Silence, MD;  Location: [facility];  Service: Endoscopy;  Laterality: N/A;  . ESOPHAGOGASTRODUODENOSCOPY (EGD) WITH PROPOFOL N/A 06/16/2013   Procedure: ESOPHAGOGASTRODUODENOSCOPY (EGD) WITH PROPOFOL;  Surgeon: Arta Silence, MD;  Location: [facility] ENDOS[facility];  Service: Endoscopy;  Laterality: N/A;  . ESOPHAGOGASTRODUODENOSCOPY (EGD) WITH PROPOFOL N/A 11/16/2014   Procedure: ESOPHAGOGASTRODUODENOSCOPY (EGD) WITH PROPOFOL;  Surgeon: Arta Silence, MD;  Location: [facility] END[facility];  Service: Endoscopy;  Laterality: N/A;  . ESOPHAGOGASTRODUODENOSCOPY (EGD) WITH PROPOFOL N/A 02/13/2017   Procedure: ESOPHAGOGASTRODUODENOSCOPY (EGD) WITH PROPOFOL;  Surgeon: Arta Silence, MD;  Location: [place];  Service: Endoscopy;  Laterality: N/A;  . EYE SURGERY     cataract sx  . HERNIA REPAIR     x 2  . TOTAL HIP ARTHROPLASTY     bilateral     Current Outpatient Medications  Medication Sig Dispense Refill  . amLODipine (NORVASC) 5 MG tablet Take 5 mg by mouth daily.    Marland Kitchen  apixaban (ELIQUIS) 5 MG TABS tablet Take 1 tablet (5 mg total) by mouth 2 (two) times daily. 60 tablet 5  . atorvastatin (LIPITOR) 10 MG tablet Take 10 mg tablet by mouth Monday, Wednesday and friday    . colchicine 0.6 MG tablet TK 1 T PO Q 12 H PRF GOUT FLARE    . docusate sodium (STOOL SOFTENER) 100 MG capsule Take 200 mg by mouth at bedtime.    . donepezil (ARICEPT) 5 MG tablet Take 5 mg by mouth at bedtime.    . finasteride (PROSCAR) 5 MG tablet Take 5 mg by mouth every morning.     . halobetasol (ULTRAVATE) 0.05 % ointment Apply 1 application topically 2 (two) times daily as needed (itching or swelling). APPLY TWICE DAILY TO HANDS  0  .  isosorbide mononitrate (IMDUR) 30 MG 24 hr tablet Take 30 mg by mouth daily with lunch.     . loratadine (ALLERGY) 10 MG tablet Take 10 mg by mouth at bedtime.    Marland Kitchen losartan (COZAAR) 50 MG tablet Take 50 mg by mouth at bedtime.     Marland Kitchen NITROSTAT 0.4 MG SL tablet Place 1 tablet (0.4 mg total) under the tongue every 5 (five) minutes as needed for chest pain. AS NEEDED FOR CHEST PAIN 25 tablet 6  . omeprazole (PRILOSEC) 20 MG capsule Take 20 mg by mouth daily.    . QUEtiapine (SEROQUEL) 25 MG tablet TK 1 T PO QD HS     No current facility-administered medications for this visit.    Allergies:   Patient has no known allergies.    Social History:  The patient  reports that he has never smoked. He has never used smokeless tobacco. He reports that he does not drink alcohol or use drugs.   Family History:  The patient's Family history is unknown by patient.    ROS:  Please see the history of present illness.     All other systems are reviewed and negative.   Physical Exam: Blood pressure (!) 146/70, pulse 65, height 5\' 7"  (1.702 m), weight 171 lb (77.6 kg), SpO2 97 %.  GEN: Elderly, chronically ill-appearing gentleman.  Speech pattern is slow. HEENT: Normal NECK: No JVD; No carotid bruits LYMPHATICS: No lymphadenopathy CARDIAC: RRR  RESPIRATORY:   Clear to auscultation without rales, wheezing or rhonchi  ABDOMEN: Soft, non-tender, non-distended MUSCULOSKELETAL:  No edema; No deformity  SKIN: Warm and dry NEUROLOGIC:  Alert and oriented x 3    EKG:   Normal sinus rhythm at 65.  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  No changes from previous EKG.  Recent Labs: 07/14/2019: ALT 13; BUN 20; Creatinine, Ser 1.09; Potassium 4.3; Sodium 145; TSH 0.983    Lipid Panel No results found for: CHOL, TRIG, HDL, CHOLHDL, VLDL, LDLCALC, LDLDIRECT    Wt Readings from Last 3 Encounters:  11/10/19 171 lb (77.6 kg)  07/14/19 170 lb (77.1 kg)  10/12/18 175 lb (79.4 kg)      Other studies Reviewed: Additional studies/ records that were reviewed today include: . Review of the above records demonstrates:    ASSESSMENT AND PLAN:  1. Atrial fib:    He has a history of paroxysmal H fibrillation.  He is in normal sinus rhythm today.  Continue Eliquis 5 mg twice a day.  We will check a basic metabolic profile and CBC today since he is on Eliquis.   2.  HTN:    Blood pressure is generally well controlled.  It is slightly elevated today.  He admits to eating a little extra salt with discussed decreasing his salt intake.  3.  Fatigue : His fatigue seems to be gradually progressing.  He will discuss this with his primary medical doctor.    4.  Coronary artery disease:    He had an old inferior wall myocardial infarction.  He is not having any current episodes of angina.    Will have him see an APP in 1 year   Current medicines are reviewed at length with the patient today.  The patient does not have concerns regarding medicines.  The following changes have been made:  no change  Labs/ tests ordered today include:   Orders Placed This Encounter  Procedures  . Basic metabolic panel  . CBC    Disposition:  FU with Gae Bon in 6 months, will see me back in 1 year   Mertie Moores, MD  11/10/2019 10:05 AM    [address], [place], [place]  [id number] Phone: [phone]; Fax: [phone]

## 2019-11-10 NOTE — Patient Instructions (Signed)
Medication Instructions:  Your physician recommends that you continue on your current medications as directed. Please refer to the Current Medication list given to you today.  *If you need a refill on your cardiac medications before your next appointment, please call your pharmacy*  Lab Work: BMET and CBC today  If you have labs (blood work) drawn today and your tests are completely normal, you will receive your results only by: Marland Kitchen MyChart Message (if you have MyChart) OR . A paper copy in the mail If you have any lab test that is abnormal or we need to change your treatment, we will call you to review the results.  Testing/Procedures: None  Follow-Up: At [facility], you and your health needs are our priority.  As part of our continuing mission to provide you with exceptional heart care, we have created designated Provider Care Teams.  These Care Teams include your primary Cardiologist (physician) and Advanced Practice Providers (APPs -  Physician Assistants and Nurse Practitioners) who all work together to provide you with the care you need, when you need it.  Your next appointment:   12 month(s)  The format for your next appointment:   In Person  Provider:   You may see Mertie Moores, MD or one of the following Advanced Practice Providers on your designated Care Team:    Richardson Dopp, PA-C  [place], [place]  Daune Perch, NP   Other Instructions

## 2019-11-11 NOTE — Addendum Note (Signed)
Addended by: Maren Beach, Darnise Montag A on: 11/11/2019 03:12 PM   Modules accepted: Orders

## 2019-11-15 ENCOUNTER — Encounter: Payer: Self-pay | Admitting: Podiatry

## 2019-11-15 ENCOUNTER — Ambulatory Visit (INDEPENDENT_AMBULATORY_CARE_PROVIDER_SITE_OTHER): Payer: Medicare Other | Admitting: Podiatry

## 2019-11-15 ENCOUNTER — Other Ambulatory Visit: Payer: Self-pay

## 2019-11-15 DIAGNOSIS — M79676 Pain in unspecified toe(s): Secondary | ICD-10-CM

## 2019-11-15 DIAGNOSIS — D689 Coagulation defect, unspecified: Secondary | ICD-10-CM | POA: Diagnosis not present

## 2019-11-15 DIAGNOSIS — B351 Tinea unguium: Secondary | ICD-10-CM

## 2019-11-15 DIAGNOSIS — E119 Type 2 diabetes mellitus without complications: Secondary | ICD-10-CM

## 2019-11-15 NOTE — Progress Notes (Signed)
Patient ID: Jonathan [name], male   DOB: [birth year], 86 y.o.   MRN: [id number] Complaint:  Visit Type: Patient returns to my office for continued preventative foot care services. Complaint: Patient states" my nails have grown long and thick and become painful to walk and wear shoes". The patient presents for preventative foot care services. No changes to ROS.  Patient on eliquiss. Patient has been diagnosed with Diabetes. Podiatric Exam: Vascular: dorsalis pedis and posterior tibial pulses are palpable bilateral. Capillary return is immediate. Temperature gradient is WNL. Skin turgor WNL  Sensorium: Normal Semmes Weinstein monofilament test. Normal tactile sensation bilaterally. Nail Exam: Pt has thick disfigured discolored nails with subungual debris noted bilateral entire nail hallux through fifth toenails Ulcer Exam: There is no evidence of ulcer or pre-ulcerative changes or infection. Orthopedic Exam: Muscle tone and strength are WNL. No limitations in general ROM. No crepitus or effusions noted. Foot type and digits show no abnormalities. Bony prominences are unremarkable. Skin: No Porokeratosis. No infection or ulcers  Diagnosis:  Onychomycosis, , Pain in right toe, pain in left toes  Treatment & Plan Procedures and Treatment: Consent by patient was obtained for treatment procedures. The patient understood the discussion of treatment and procedures well. All questions were answered thoroughly reviewed. Debridement of mycotic and hypertrophic toenails, 1 through 5 bilateral and clearing of subungual debris. No ulceration, no infection noted.   Return Visit-Office Procedure: Patient instructed to return to the office for a follow up visit 3 months for continued evaluation and treatment.   [place] Barefoot DPM

## 2019-11-23 ENCOUNTER — Encounter: Payer: Medicare Other | Attending: Psychology | Admitting: Psychology

## 2019-11-23 ENCOUNTER — Other Ambulatory Visit: Payer: Self-pay

## 2019-11-23 DIAGNOSIS — I10 Essential (primary) hypertension: Secondary | ICD-10-CM | POA: Diagnosis not present

## 2019-11-23 DIAGNOSIS — R269 Unspecified abnormalities of gait and mobility: Secondary | ICD-10-CM | POA: Insufficient documentation

## 2019-11-23 DIAGNOSIS — R7303 Prediabetes: Secondary | ICD-10-CM | POA: Insufficient documentation

## 2019-11-23 DIAGNOSIS — M1990 Unspecified osteoarthritis, unspecified site: Secondary | ICD-10-CM | POA: Diagnosis not present

## 2019-11-23 DIAGNOSIS — R441 Visual hallucinations: Secondary | ICD-10-CM | POA: Insufficient documentation

## 2019-11-23 DIAGNOSIS — F0789 Other personality and behavioral disorders due to known physiological condition: Secondary | ICD-10-CM | POA: Diagnosis not present

## 2019-11-23 DIAGNOSIS — G4733 Obstructive sleep apnea (adult) (pediatric): Secondary | ICD-10-CM | POA: Insufficient documentation

## 2019-11-23 DIAGNOSIS — Z96643 Presence of artificial hip joint, bilateral: Secondary | ICD-10-CM | POA: Diagnosis not present

## 2019-11-23 DIAGNOSIS — I4891 Unspecified atrial fibrillation: Secondary | ICD-10-CM | POA: Diagnosis not present

## 2019-11-23 DIAGNOSIS — F09 Unspecified mental disorder due to known physiological condition: Secondary | ICD-10-CM | POA: Diagnosis not present

## 2019-11-23 DIAGNOSIS — K219 Gastro-esophageal reflux disease without esophagitis: Secondary | ICD-10-CM | POA: Diagnosis not present

## 2019-11-23 DIAGNOSIS — I6789 Other cerebrovascular disease: Secondary | ICD-10-CM | POA: Diagnosis not present

## 2019-11-23 DIAGNOSIS — Z9989 Dependence on other enabling machines and devices: Secondary | ICD-10-CM | POA: Diagnosis not present

## 2019-11-23 DIAGNOSIS — I2510 Atherosclerotic heart disease of native coronary artery without angina pectoris: Secondary | ICD-10-CM | POA: Insufficient documentation

## 2019-11-23 NOTE — Progress Notes (Signed)
Neuropsychological Consultation   Patient:   Jonathan [name]   DOB:   04-23-1931  MR Number:  [id number]  Location:  [facility] FOR PAIN AND [facility] MEDICINE [facility] PHYSICAL MEDICINE AND REHABILITATION [place], [place] [id number] [place] [address] Dept: [phone]           Date of Service:   11/23/2019  Start Time:   9 AM End Time:   11 AM  Today's visit was a 1 hour in person visit that was conducted in my outpatient clinic office.  The patient, his wife and myself were present for this visit.  Provider/Observer:  Ilean Skill, Psy.D.       Clinical Neuropsychologist       Billing Code/Service: 96116/96121  Chief Complaint:    Jonathan [name] is an 86 year old male referred by [name], MD for neuropsychological evaluation due to progressing memory loss of memory changes, changes in executive functioning, gait disturbance and balance issues, as well as the development of visual and some auditory hallucinations particularly at night.  The patient has a history of underlying complex medical issues including hypertension, A. fib, coronary artery disease, arthritis, borderline diabetes, reflux disease, multiple prior surgeries including bilateral hip arthroplasties, hernia repair, cataract surgeries, corneal transplant, multiple EGDs and history of sleep apnea being treated with CPAP device.  The patient and his wife report that they began noticing some symptoms approximately 5 years ago.  The patient's wife reports that she first noticed the patient having difficulties with runny nose and drooling as well as tremors around 5 years ago.  This was after he was hospitalized for a bowel obstruction.  She reports that he has had difficulties ever since.  They describe issues with memory particularly short-term memory difficulties, changes in motor function and gait difficulties, difficulty with maintaining train of thought and concentration, and  issues with executive functioning.  Reason for Service:  [name] is an 86 year old male referred by [name], MD for neuropsychological evaluation due to progressing memory loss of memory changes, changes in executive functioning, gait disturbance and balance issues, as well as the development of visual and some auditory hallucinations particularly at night.  The patient has a history of underlying complex medical issues including hypertension, A. fib, coronary artery disease, arthritis, borderline diabetes, reflux disease, multiple prior surgeries including bilateral hip arthroplasties, hernia repair, cataract surgeries, corneal transplant, multiple EGDs and history of sleep apnea being treated with CPAP device.  The patient and his wife report that they began noticing some symptoms approximately 5 years ago.  The patient's wife reports that she first noticed the patient having difficulties with runny nose and drooling as well as tremors around 5 years ago.  This was after he was hospitalized for a bowel obstruction.  She reports that he has had difficulties ever since.  They describe issues with memory particularly short-term memory difficulties, changes in motor function and gait difficulties, difficulty with maintaining train of thought and concentration, and issues with executive functioning.  The patient had a fall in the past where he broke C3 but did not go into details about this injury.  The patient has been dealing with hypertension since he was around age 46 and has been told that he had a "silent heart attack" and is also dealing with borderline diabetes.  He does describe a brief time of amnesia around the time of his fall and over the night after his fall.  The patient himself reports that while  long-term memory seems to be okay he is having difficulty with short-term memory although he reports that over time sometimes he is able to recall information.  The patient's wife reports that  the patient will often loses train of thoughts and cannot maintain attention and focus.  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  He was always able to solve issues quite easily.  However, she reports that he is not able to do even basic types of repairs around the house and has trouble remembering how to execute various repairs.  She reports that he is also having difficulties remembering recent events.  The patient's wife reports a significant reduction in energy levels and activities and often sleeps not only at night but during the day as well.  The patient has been diagnosed with obstructive sleep apnea and has used a CPAP device.  They both report that he uses a CPAP device at night but does not use it when he takes naps during the day.  He was encouraged to do so.  They also both describe the development of visual and auditory hallucinations that happen more often at night but can happen during the day.  The patient and his wife both report that he often will see a child either hiding behind a chair or in the room.  He is aware that these are not real events but does clearly see or hear these visual hallucinations and again, they are often children in nature.  The patient is wife have had a significant reduction in their social interactions primarily due to COVID-19 as well as concerned that the patient will have other falls.  He does keep a cane but does not always use it and does not use a cane around the house.  The patient had an MRI in 2018 with an impression of slightly abnormal MRI of brain showing age-appropriate changes of mild chronic microvascular ischemia and generalized atrophy.  CT scan of the head conducted in 2019 did show no evidence of acute infarction, hemorrhage, hydrocephalus, or other mass lesion or mass-effect.  There was cerebral volume loss with mild to moderate chronic  vessel ischemia in the cerebral white matter.  The patient reports that his appetite is good but his excessive drooling when he is sleeping or runny nose during the day are causing him issues and problems.  The patient is also generally dysphoric and is having trouble coping with his significant loss of function and the fact that he is unable to help out his kids who live next door.  Reliability of Information: The information is derived from 1 hour face-to-face clinical interview with the patient as well as review of available medical records.  Behavioral Observation: Mycal Sistare  presents as a 86 y.o.-year-old Right Caucasian Male who appeared his stated age. his dress was Appropriate and he was Well Groomed and his manners were Appropriate to the situation.  his participation was indicative of Appropriate and Redirectable behaviors.  There were  physical disabilities noted related to balance and gait issues.  The patient had difficulty standing up from his chair at the end of our visit due to strength and balance issues.  he displayed an appropriate level of cooperation and motivation.     Interactions:    Active Appropriate and Redirectable  Attention:   abnormal and attention span appeared shorter than expected for age  Memory:   abnormal; remote memory intact, recent memory impaired  Visuo-spatial:  not examined  Speech (Volume):  low  Speech:   normal; some slowed verbal response times and mild issues with word finding.  Thought Process:  Coherent and Relevant  Though Content:  WNL; not suicidal and not homicidal  Orientation:   person, place, time/date and situation  Judgment:   Good  Planning:   Fair  Affect:    Flat and Tearful  Mood:    Dysphoric  Insight:   Good  Intelligence:   normal  Marital Status/Living: The patient was born and raised in [place] with no siblings.  He currently lives with his wife of 70 years.  He has no previous marriages.   The patient has 2 sons age 43 and 40.  Current Employment: The patient is retired.  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  The patient also worked as a Psychologist, sport and exercise at times.  The patient was in the [employer] between [place].  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  Substance Use:  No concerns of substance abuse are reported.    Education:   The patient completed the 10th grade of school but had further training in the [employer] as well as after the [employer] and mechanics and other fields.  Medical History:   Past Medical History:  Diagnosis Date  . Acne rosacea   . Arthritis   . Atrial fibrillation ([place])   . Borderline diabetes   . CAD (coronary artery disease)   . Diabetes mellitus without complication ([place])   . Diverticulosis   . Elevated PSA   . Esophageal stricture   . GERD (gastroesophageal reflux disease)   . History of nuclear stress test    Nuclear stress test 6/18: EF 59, small apical defect (?old MI); no ischemia; Low Risk  . Hypertension   . Myocardial infarction ([place])   . Neck fracture ([place]) 2015  . PONV (postoperative nausea and vomiting) 08/09/2014  . Shoulder pain     Psychiatric History:  No prior psychiatric history.  Family Med/Psych History:  Family History  Family history unknown: Yes    Impression/DX:  Keyjuan Tiano is an 86 year old male referred by [name], MD for neuropsychological evaluation due to progressing memory loss of memory changes, changes in executive functioning, gait disturbance and balance issues, as well as the development of visual and some auditory hallucinations particularly at night.  The patient has a history of underlying complex medical issues including hypertension, A. fib, coronary artery disease, arthritis, borderline diabetes, reflux disease, multiple  prior surgeries including bilateral hip arthroplasties, hernia repair, cataract surgeries, corneal transplant, multiple EGDs and history of sleep apnea being treated with CPAP device.  The patient and his wife report that they began noticing some symptoms approximately 5 years ago.  The patient's wife reports that she first noticed the patient having difficulties with runny nose and drooling as well as tremors around 5 years ago.  This was after he was hospitalized for a bowel obstruction.  She reports that he has had difficulties ever since.  They describe issues with memory particularly short-term memory difficulties, changes in motor function and gait difficulties, difficulty with maintaining train of thought and concentration, and issues with executive functioning.  Disposition/Plan:  We have set the  patient up for formal neuropsychological testing.  As part of this testing procedures we will administer the RBANS neuropsychological test battery, expressive language measures, executive functioning and problem-solving measures as well as looking at fine motor control and manual dexterity measures.  Diagnosis:    Cognitive and neurobehavioral dysfunction  Visual hallucination         Electronically Signed   _______________________ Ilean Skill, Psy.D.

## 2019-11-26 ENCOUNTER — Encounter: Payer: Medicare Other | Admitting: Psychology

## 2019-11-26 ENCOUNTER — Encounter: Payer: Self-pay | Admitting: Psychology

## 2019-11-26 ENCOUNTER — Other Ambulatory Visit: Payer: Self-pay

## 2019-11-26 DIAGNOSIS — R441 Visual hallucinations: Secondary | ICD-10-CM | POA: Diagnosis not present

## 2019-11-26 DIAGNOSIS — I6789 Other cerebrovascular disease: Secondary | ICD-10-CM | POA: Diagnosis not present

## 2019-11-26 DIAGNOSIS — F09 Unspecified mental disorder due to known physiological condition: Secondary | ICD-10-CM

## 2019-11-26 DIAGNOSIS — Z9989 Dependence on other enabling machines and devices: Secondary | ICD-10-CM | POA: Diagnosis not present

## 2019-11-26 DIAGNOSIS — F0789 Other personality and behavioral disorders due to known physiological condition: Secondary | ICD-10-CM

## 2019-11-26 DIAGNOSIS — G4733 Obstructive sleep apnea (adult) (pediatric): Secondary | ICD-10-CM | POA: Diagnosis not present

## 2019-11-26 DIAGNOSIS — R269 Unspecified abnormalities of gait and mobility: Secondary | ICD-10-CM | POA: Diagnosis not present

## 2019-11-26 NOTE — Addendum Note (Signed)
Addended by: Janan Halter F on: 11/26/2019 12:41 PM   Modules accepted: Orders

## 2019-11-26 NOTE — Progress Notes (Addendum)
The patient arrived on time to his 1:00 testing appointment, which lasted 120 minutes.  Behavioral Observations:  Appearance: Casually and appropriately dressed with good hygiene. Gait: Ambulated independently without assistance. Speech: Clear, normal rate, normal tone & volume. Thought process:  disorganized, concrete, & somewhat impulsive.  Mood/Affect: Mild-Moderately anxious & depressed, appropriate.  Interpersonal: Polite and appropriate. Orientation: Oriented x 4 Effort/Motivation: Good   He did not appear to have difficulty seeing, hearing, or understanding test items/questions and did not require much additional prompting. He exhibited adequate distress tolerance on questions he did not know or tasks that were more difficult. He was cooperative with all assigned tasks.   Tests Administered: . Animal Naming  . Clock Drawing Test . Controlled Oral Word Association Test (COWAT) . Grooved Pegboard  . Modified [employer] (M-WCST) . Test of Premorbid Function (TOPF) . Repeatable Battery for the Assessment of Neuropsychological Status (RBANS-U Form A)  Results:  TOPF . Total=18, SS=83, 13%  Animal Naming  . Total=16, T=50, 50%  COWAT . Total=22, T=39, 14%  Clock Drawing Test . Below Engineer, manufacturing systems . Dominant (Rt. Hand) o Time=262", T=31, 3%  . Non-Dominant (Lt. Hand) o Time=445", T=30, 2%  M-WCST . # Categories Correct o Total=2, T=36, 8% . # Total Errors  o Total Raw=42, T=27, 1% . # Perseverative Errors  o Total=34, T=23, <1% . % Perseverative Errors  o Total=0.81, T=36, 8% . Executive Functioning Composite  o SS=66, 1%  RBANS-Form A  Measure Standard or Scaled Score Percentile Description  Immediate Memory 69 2 Mild Impaired  List Learning 5 5 Borderline  Story Memory 4 2 Mild Impaired  Visuospatial/Constructional 126 96 Superior  Figure Copy 12 75 High Average  Line Orientation - >75 High Average  Language 92 3-  Average  Picture Naming - >75 High Average  Semantic Fluency 6 9 Low Average  Attention 82 12 Low Average  Digit Span 12 75 High Average  Coding 2 <1 Impaired  Delayed Memory 84 14 Low Average  List Recall - 26-50 Average  List Recognition - 17-25 Low Average- Average  Story Recall 7 16 Low Average  Figure Recall 7 16 Low Average  Total Score 86 18 Low Average

## 2019-12-08 DIAGNOSIS — I2510 Atherosclerotic heart disease of native coronary artery without angina pectoris: Secondary | ICD-10-CM | POA: Diagnosis not present

## 2019-12-08 DIAGNOSIS — E785 Hyperlipidemia, unspecified: Secondary | ICD-10-CM | POA: Diagnosis not present

## 2019-12-08 DIAGNOSIS — I252 Old myocardial infarction: Secondary | ICD-10-CM | POA: Diagnosis not present

## 2019-12-08 DIAGNOSIS — I25119 Atherosclerotic heart disease of native coronary artery with unspecified angina pectoris: Secondary | ICD-10-CM | POA: Diagnosis not present

## 2019-12-08 DIAGNOSIS — I4891 Unspecified atrial fibrillation: Secondary | ICD-10-CM | POA: Diagnosis not present

## 2019-12-08 DIAGNOSIS — I209 Angina pectoris, unspecified: Secondary | ICD-10-CM | POA: Diagnosis not present

## 2019-12-08 DIAGNOSIS — N400 Enlarged prostate without lower urinary tract symptoms: Secondary | ICD-10-CM | POA: Diagnosis not present

## 2019-12-08 DIAGNOSIS — I10 Essential (primary) hypertension: Secondary | ICD-10-CM | POA: Diagnosis not present

## 2019-12-08 DIAGNOSIS — E119 Type 2 diabetes mellitus without complications: Secondary | ICD-10-CM | POA: Diagnosis not present

## 2019-12-09 ENCOUNTER — Encounter: Payer: Self-pay | Admitting: Psychology

## 2019-12-09 ENCOUNTER — Other Ambulatory Visit: Payer: Self-pay

## 2019-12-09 ENCOUNTER — Encounter (HOSPITAL_BASED_OUTPATIENT_CLINIC_OR_DEPARTMENT_OTHER): Payer: Medicare Other | Admitting: Psychology

## 2019-12-09 DIAGNOSIS — R5383 Other fatigue: Secondary | ICD-10-CM | POA: Diagnosis not present

## 2019-12-09 DIAGNOSIS — F09 Unspecified mental disorder due to known physiological condition: Secondary | ICD-10-CM

## 2019-12-09 DIAGNOSIS — F0789 Other personality and behavioral disorders due to known physiological condition: Secondary | ICD-10-CM

## 2019-12-09 DIAGNOSIS — R441 Visual hallucinations: Secondary | ICD-10-CM | POA: Diagnosis not present

## 2019-12-09 DIAGNOSIS — I6789 Other cerebrovascular disease: Secondary | ICD-10-CM

## 2019-12-09 DIAGNOSIS — D6869 Other thrombophilia: Secondary | ICD-10-CM | POA: Diagnosis not present

## 2019-12-09 DIAGNOSIS — R350 Frequency of micturition: Secondary | ICD-10-CM | POA: Diagnosis not present

## 2019-12-09 DIAGNOSIS — I25119 Atherosclerotic heart disease of native coronary artery with unspecified angina pectoris: Secondary | ICD-10-CM | POA: Diagnosis not present

## 2019-12-09 DIAGNOSIS — E785 Hyperlipidemia, unspecified: Secondary | ICD-10-CM | POA: Diagnosis not present

## 2019-12-09 DIAGNOSIS — R269 Unspecified abnormalities of gait and mobility: Secondary | ICD-10-CM | POA: Diagnosis not present

## 2019-12-09 DIAGNOSIS — Z79899 Other long term (current) drug therapy: Secondary | ICD-10-CM | POA: Diagnosis not present

## 2019-12-09 DIAGNOSIS — Z9989 Dependence on other enabling machines and devices: Secondary | ICD-10-CM

## 2019-12-09 DIAGNOSIS — I252 Old myocardial infarction: Secondary | ICD-10-CM | POA: Diagnosis not present

## 2019-12-09 DIAGNOSIS — G4733 Obstructive sleep apnea (adult) (pediatric): Secondary | ICD-10-CM | POA: Diagnosis not present

## 2019-12-09 DIAGNOSIS — N400 Enlarged prostate without lower urinary tract symptoms: Secondary | ICD-10-CM | POA: Diagnosis not present

## 2019-12-09 DIAGNOSIS — I10 Essential (primary) hypertension: Secondary | ICD-10-CM | POA: Diagnosis not present

## 2019-12-09 DIAGNOSIS — Z0000 Encounter for general adult medical examination without abnormal findings: Secondary | ICD-10-CM | POA: Diagnosis not present

## 2019-12-09 DIAGNOSIS — Z1389 Encounter for screening for other disorder: Secondary | ICD-10-CM | POA: Diagnosis not present

## 2019-12-09 DIAGNOSIS — I4891 Unspecified atrial fibrillation: Secondary | ICD-10-CM | POA: Diagnosis not present

## 2019-12-09 DIAGNOSIS — E119 Type 2 diabetes mellitus without complications: Secondary | ICD-10-CM | POA: Diagnosis not present

## 2019-12-09 NOTE — Progress Notes (Addendum)
Neuropsychological Evaluation   Patient:  Jonathan [name]   DOB: 1931-08-12  MR Number: [id number]  Location: [facility] PH[facility]ICINE AND REHABILIT[facility] [place] [place] [id number] [place] [address] Dept: [phone]  Start: 8 AM End: 9 AM  Provider/Observer:     Edgardo Roys PsyD  Chief Complaint:      Chief Complaint  Patient presents with  . Memory Loss  . Fall  . Other    Gait disturbance    Reason For Service:     Jonathan [name] is an 86 year old male referred by Jonathan Age, MD for neuropsychological evaluation due to progressing memory loss and memory changes, changes in executive functioning, gait disturbance and balance issues, as well as the development of visual and some auditory hallucinations particularly at night.  The patient has a history of underlying complex medical issues including hypertension, A. fib, coronary artery disease, arthritis, borderline diabetes, reflux disease, multiple prior surgeries including bilateral hip arthroplasties, hernia repair, cataract surgeries, corneal transplant, multiple EGDs and history of sleep apnea being treated with CPAP device.  The patient and his wife report that they began noticing some symptoms approximately 5 years ago.  The patient's wife reports that she first noticed the patient having difficulties with runny nose and drooling as well as tremors around 5 years ago.  This was after he was hospitalized for a bowel obstruction.  She reports that he has had difficulties ever since.  They describe issues with memory particularly short-term memory difficulties, changes in motor function and gait difficulties, difficulty with maintaining train of thought and concentration, and issues with executive functioning.  The patient had a fall in the past where he broke C3 but did not go into details about this injury.  The patient has been dealing  with hypertension since he was around [name] 58 and has been told that he had a "silent heart attack" and is also dealing with borderline diabetes.  He does describe a brief time of amnesia around the time of his fall and over the night after his fall.  The patient himself reports that while long-term memory seems to be okay he is having difficulty with short-term memory although he reports that over time sometimes he is able to recall information.  The patient's wife reports that the patient will often loses train of thoughts and cannot maintain attention and focus.  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  He was always able to solve issues quite easily.  However, she reports that he is not able to do even basic types of repairs around the house and has trouble remembering how to execute various repairs.  She reports that he is also having difficulties remembering recent events.  The patient's wife reports a significant reduction in energy levels and activities and often sleeps not only at night but during the day as well.  The patient has been diagnosed with obstructive sleep apnea and has used a CPAP device.  They both report that he uses a CPAP device at night but does not use it when he takes naps during the day.  He was encouraged to do so.  They both describe the development of visual and auditory hallucinations that happen more often at night but can happen during the day.  The patient and his wife both report that  he often will see a child either hiding behind a chair or in the room.  He is aware that these are not real events but does clearly see or hear these hallucinations and again, they are often children in nature.  The patient and his wife have had a significant reduction in their social interactions primarily due to COVID-19 as well as concerned that the patient will have other falls.   He does keep a cane but does not always use it and does not use a cane around the house.  The patient had an MRI in 2018 with an impression of slightly abnormal MRI of brain showing [name]-appropriate changes of mild chronic microvascular ischemia and generalized atrophy.  CT scan of the head conducted in 2019 showed no evidence of acute infarction, hemorrhage, hydrocephalus, or other mass lesion or mass-effect.  There was cerebral volume loss with mild to moderate chronic vessel ischemia in the cerebral white matter.  The patient reports that his appetite is good but is excessively drooling when he is sleeping or has a runny nose during the day, causing him issues and problems.  The patient is also generally dysphoric and is having trouble coping with his significant loss of function and the fact that he is unable to help out his kids, who live next door.  Behavioral Observations: Appearance:Casually and appropriately dressed with good hygiene. Gait:Ambulated independently without assistance. Speech:Clear, normal rate, normal tone & volume. Thought process: disorganized, concrete, & somewhat impulsive.  Mood/Affect:Mild-Moderately anxious & depressed, appropriate.  Interpersonal: Polite and appropriate. Orientation: Oriented x 4 Effort/Motivation: Good   He did not appear to have difficulty seeing, hearing, or understanding test items/questions and did not require much additional prompting. He exhibited adequate distress tolerance on questions he did not know or tasks that were more difficult. He was cooperative with all assigned tasks.   Tests Administered:  Engineer, maintenance Drawing Test  Controlled Oral Word Association Test (COWAT)  Grooved Pegboard   Modified [employer] Test (M-WCST)  Test of Premorbid Function (TOPF)  Repeatable Battery for the Assessment of Neuropsychological Status (RBANS-U Form A)  Test Results:   Initially, an estimation was made as  to the patient's historical/premorbid functioning to use as comparisons to normative data.  The patient's education, occupation and [place] history suggest that he has likely historically functioned in the average to high average range of intellectual and cognitive functioning on objective neuropsychological measures with particular strengths likely to abandon the visual-spatial and visual reasoning and problem solving areas.  We also administered the test of premorbid functioning which produced a scaled score of 83 which falls at the 13th percentile relative to a normative population.  However, this is a verbal based measure and likely underestimates his historical function and also was affected by his current symptomatology. TOPF  Total=18, SS=83, 13%   Fine motor control:  Grooved Pegboard  Dominant (Rt. Hand) ? Time=262", T=31, 3%   Non-Dominant (Lt. Hand) ? Time=445", T=30, 2%  The patient was administered the grooved pegboard test which assesses fine motor control in both his dominant hand (right hand) as well as nondominant hand.  The patient showed significant fine motor control deficits bilaterally with no lateralization effect identified.  Given the patient's occupational history in which she likely had very good fine motor control historically this is a significant change in indicates impairments with regard to fine motor control bilaterally.   RBANS-Form A  Measure Standard or Scaled Score Percentile Description  Immediate Memory 69 2 Mild Impaired  List Learning 5 5 Borderline  Story Memory 4 2 Mild Impaired  Visuospatial/Constructional 126 96 Superior  Figure Copy 12 75 High Average  Line Orientation - >75 High Average  Language 92 3- Average  Picture Naming - >75 High Average  Semantic Fluency 6 9 Low Average  Attention 82 12 Low Average  Digit Span 12 75 High Average  Coding 2 <1 Impaired  Delayed Memory 84 14 Low Average  List Recall - 26-50 Average  List  Recognition - 17-25 Low Average- Average  Story Recall 7 16 Low Average  Figure Recall 7 16 Low Average  Total Score 86 18 Low Average   The patient was administered the RBANS, which is a repeatable battery assessing a broad range of cognitive functioning areas with [name]-appropriate normative state in a format that can be repeated easily with multiple equivalent versions.  The patient produced a total score index score of 86 which falls at the 18th percentile and is in the low average range.  This total composite score is significantly below predicted levels based on his education occupational history along with psychosocial variables and suggest that the patient is having significant difficulties in 1 or more area of cognitive and neuropsychological functioning.  Expressive language functioning:  The patient produced an expressive language index score of 92 which falls in the average range relative to a normative population.  However, there was significant variability noted between cued/challenge naming versus free recall in association recall.  The patient performed quite well on targeted naming measures where he performed above the 75th percentile and in the high average range relative to a normative population.  However, on the somatic fluency measure he performed in the low average range.  To further assess this pattern the patient was also administered 2 measures of verbal fluency and expressive language functioning separate from the RBANS battery.  On the category naming test (animal naming) the patient performed at the 50th percentile showing good word recall when placed in a free recall format.  The patient had a little more difficulty on the controlled oral Word Association test which has more of a targeted challenge naming requirement suggesting some difficulties with cued/association naming.  Combining these 2 patterns while there were some differences from one measure to another overall the  patient does appear to be doing fairly well with regard to expressive language functioning and there was no consistent finding of expressive language deficits.  Animal Naming   Total=16, T=50, 50%  COWAT  Total=22, T=39, 14%  Attention:  The patient produced inattention index score from the RBANS battery that produced a standard score of 82 which falls at the 12 percentile in the low average range.  The patient did quite well on measures of pure auditory encoding measures.  However, on measures of focus execute abilities in the attentional domain the patient showed significant deficits with information processing speed and visual scanning, visual searching and overall speed of mental operations.  Visual-spatial/visual constructional abilities:  The patient performed quite well on measures of visual-spatial and visual constructional abilities.  On both the figure copy task from the RBANS battery and the clock drawing test which was not part of this battery the patient showed variability but again generally within normal limits.  He performed in the high average range on the figure copy measure and in the high average range on the line orientation test.  He did perform below average on the clock drawing  test which was a separate measure but his difficulties were more with regard to planning and execution rather than pure visual spatial or visual constructional functioning.     Clock Drawing Test  Below Expectation   Memory and learning:  The patient produced an immediate memory index score of 69 which falls at the 2nd percentile and is in the mildly impaired range.  Subtest performances were consistent in both significantly impaired for list learning as well as story learning initially.  The patient produced a delayed memory index score of 84 which falls at the 14th percentile and is in the low average range.  There was considerable variability in subtest performance on this measure.  The  patient did fairly well on list recall measures after period of delay while doing quite poorly on the initial recall.  This suggest that more information is actually being stored but more of issues with retrieval of stored information.  On the list recognition, delayed recall of the story that he was initially presented with as well as recall of previously presented visual information (figure recall) he performed in the low average range.  Overall, the pattern of memory and learning suggest that while the patient is generally efficient with regard to encoding abilities there are some initial difficulties transferring information into short-term memory stores.  However, the information that is transferred into these memory stores is generally available for later recall with only mild deficits with regard to delayed memory both visual and auditorily.  Executive functioning:  M-WCST  # Categories Correct ? Total=2, T=36, 8%  # Total Errors  ? Total Raw=42, T=27, 1%  # Perseverative Errors  ? Total=34, T=23, <1%  % Perseverative Errors  ? Total=0.81, T=36, 8%  Executive Functioning Composite  ? SS=66, 1%  To assess the patient's current executive functioning and problem-solving abilities he was administered the modified [employer] card sorting test.  This is a visual reasoning and problem solving measure.  The patient showed significant deficits with regard to executive function and problem-solving.  He was only able to complete 2 of 6 categories showing significant number of errors and a significant number of perseverative errors.  The patient had difficulty shifting strategies and adjusting to changing environmental cues and adapting flexibility to his strategies even when new information is introduced to facilitate changing strategies.   Summary of Results:   Overall, the results of the current objective neuropsychological assessment point out that the patient is displaying fairly  well-preserved expressive and receptive language functions and doing quite well on measures of initial auditory encoding.  However, there are areas of significant cognitive deficits identified.  The patient shows significant impairment with regard to information processing speed variables and focus execute abilities, deficits with regard to both immediate and delayed memory functions in contrast to his good encoding abilities.  The patient is able to adequately encode new information but has difficulty transferring that information to subcortical stores (hippocampal) areas for immediate recall.  However, the information that is stored and organized is then available for later recall and does not deteriorate as a function of time.  This pattern suggest that the deficits have to do with transfer of information from encoding storage to short-term memory stores but if they are effectively transferred they are able to be organized and stored for later recall.  The patient shows significant fine motor control deficits bilaterally without indication of lateralization effects.  The patient also shows a significant degree of executive functioning deficits including focus  execute abilities, difficulty shifting attention and adjusting to environmental cues, and deficits with regard to problem solving and effective development and execution of new strategies.  Impression/Diagnosis:   Overall, the results of the current neuropsychological evaluation in conjunction with clinical data are not consistent with patterns typically seen with degenerative cortical dementia such as Alzheimer's or Lewy body.  While the patient has had some identified tremor and clearly has bilateral fine motor control issues and also experiences of mostly nighttime visual hallucinations with some periods of auditory hallucinations other patterns are not particularly consistent with Lewy body dementia.  The patient's delayed/long-term memory appears to  be generally efficient if information is initially stored.  The patient has very good encoding abilities but has difficulty transferring information from initial encoding storage to short-term memory stores which are primarily associated with hippocampal functions.  However, if the information is adequately transferred into the short-term memory stores it is retained over time and is available for later recall.  Subjective reports suggest that his long-term memory and intermediate memories are doing fairly well.  The patient's visual spatial and visual constructional abilities are intact without indication of deficits beyond those associated with fine motor control.  The patient is showing significant deficits with regard to executive functioning, problem-solving and cognitive shifting abilities.  This would suggest some frontal involvement in his difficulties.  Overall, the most likely explanation for the patient's difficulties are combination of progressing small vessel disease and microvascular ischemic changes in conjunction with significant sleep difficulties and disorder around sleep apnea.  While the patient is using his CPAP device at night he is also continuing to sleep significant amounts during the day.  The patient and his wife report that he does not use a CPAP device during the day which may be causing some significant difficulties in his excessive sleepiness at night suggest that there may be some inefficiencies with the CPAP device when he is using it.  While the most likely explanations for the patient's difficulties are related to previously identified issues of his microvascular ischemic changes particularly subcortical white matter tracts leading to frontal lobe regions as well as subcortical connections of the hippocampus region and likely other regions.  However, at this point we cannot completely rule out the possibility of Lewy body dementia but the patient has shown just mild ongoing  progression of his symptoms over the past 5 years with description of rather sudden onset identification of symptoms after bowel obstruction surgery 5 years ago.  The patient is also having some mood changes related to dysphoria and depression which at one level is expected given his clear awareness of his loss of function but also may be indicative of some of these subcortical changes due to microvascular ischemia progressing.  To more definitively differentiate between possible Lewy body versus ongoing deterioration from microvascular ischemic changes it would be appropriate to retest the patient in approximately 9 months to assess the type of an degree of progressive changes over the next 9 months.  It will be imperative that the patient take care of basic nutritional and physical activity functions and maintain active vigilance over his early stages of type 2 diabetes.  Metabolic functions, lack of physical activity and inconsistencies in nutritional patterns likely will exacerbate his underlying microvascular ischemic changes.  Diagnosis:    Axis I: Cerebral microvascular disease  Cognitive and neurobehavioral dysfunction  Visual hallucination  Obstructive sleep apnea on CPAP   Ilean Skill, Psy.D. Neuropsychologist

## 2019-12-14 DIAGNOSIS — G4733 Obstructive sleep apnea (adult) (pediatric): Secondary | ICD-10-CM | POA: Diagnosis not present

## 2019-12-20 ENCOUNTER — Other Ambulatory Visit: Payer: Self-pay

## 2019-12-20 ENCOUNTER — Encounter: Payer: Self-pay | Admitting: Psychology

## 2019-12-20 ENCOUNTER — Encounter: Payer: Medicare Other | Attending: Psychology | Admitting: Psychology

## 2019-12-20 DIAGNOSIS — K219 Gastro-esophageal reflux disease without esophagitis: Secondary | ICD-10-CM | POA: Insufficient documentation

## 2019-12-20 DIAGNOSIS — M1990 Unspecified osteoarthritis, unspecified site: Secondary | ICD-10-CM | POA: Insufficient documentation

## 2019-12-20 DIAGNOSIS — I2510 Atherosclerotic heart disease of native coronary artery without angina pectoris: Secondary | ICD-10-CM | POA: Diagnosis not present

## 2019-12-20 DIAGNOSIS — F0789 Other personality and behavioral disorders due to known physiological condition: Secondary | ICD-10-CM

## 2019-12-20 DIAGNOSIS — I6789 Other cerebrovascular disease: Secondary | ICD-10-CM | POA: Diagnosis not present

## 2019-12-20 DIAGNOSIS — E119 Type 2 diabetes mellitus without complications: Secondary | ICD-10-CM | POA: Diagnosis not present

## 2019-12-20 DIAGNOSIS — F09 Unspecified mental disorder due to known physiological condition: Secondary | ICD-10-CM | POA: Insufficient documentation

## 2019-12-20 DIAGNOSIS — I10 Essential (primary) hypertension: Secondary | ICD-10-CM | POA: Insufficient documentation

## 2019-12-20 DIAGNOSIS — I4891 Unspecified atrial fibrillation: Secondary | ICD-10-CM | POA: Insufficient documentation

## 2019-12-20 DIAGNOSIS — Z9989 Dependence on other enabling machines and devices: Secondary | ICD-10-CM | POA: Diagnosis not present

## 2019-12-20 DIAGNOSIS — R7303 Prediabetes: Secondary | ICD-10-CM | POA: Insufficient documentation

## 2019-12-20 DIAGNOSIS — R441 Visual hallucinations: Secondary | ICD-10-CM

## 2019-12-20 DIAGNOSIS — R269 Unspecified abnormalities of gait and mobility: Secondary | ICD-10-CM | POA: Insufficient documentation

## 2019-12-20 DIAGNOSIS — Z96643 Presence of artificial hip joint, bilateral: Secondary | ICD-10-CM | POA: Insufficient documentation

## 2019-12-20 DIAGNOSIS — G4733 Obstructive sleep apnea (adult) (pediatric): Secondary | ICD-10-CM | POA: Diagnosis not present

## 2019-12-20 NOTE — Progress Notes (Signed)
Today I provided feedback regarding the results of the recent neuropsychological evaluation.  Today's visit was a 1 hour in person visit that was conducted in my outpatient clinic office.  The patient, his wife and myself were present for this visit.  We reviewed the results of the evaluation that suggested that microvascular/small vessel disease where the primary component in his ongoing cognitive and memory difficulties along with some issues related to significant sleep disturbance.  I made specific recommendations about metabolic issues including physical activity, improved sleep patterns and dietary issues.  We have also scheduled the patient to return in approximately 9 months to repeat this testing if there is any worsening or deterioration in his functioning.  I also recommended that the patient follow-up regarding his sleep apnea as his wife reports that he has had some times where she is measured his pulse ox while at sleep and it be in the mid 80s.  The patient is going to talk with his PCP about a referral for some physical therapy and[facility]  Below is the summary and recommendations for the neuropsychological evaluation that was recently completed.  You can find the complete evaluation in his EMR dated 12/09/2019.  Summary of Results:                        Overall, the results of the current objective neuropsychological assessment point out that the patient is displaying fairly well-preserved expressive and receptive language functions and doing quite well on measures of initial auditory encoding.  However, there are areas of significant cognitive deficits identified.  The patient shows significant impairment with regard to information processing speed variables and focus execute abilities, deficits with regard to both immediate and delayed memory functions in contrast to his good encoding abilities.  The patient is able to adequately encode new information but has  difficulty transferring that information to subcortical stores (hippocampal) areas for immediate recall.  However, the information that is stored and organized is then available for later recall and does not deteriorate as a function of time.  This pattern suggest that the deficits have to do with transfer of information from encoding storage to short-term memory stores but if they are effectively transferred they are able to be organized and stored for later recall.  The patient shows significant fine motor control deficits bilaterally without indication of lateralization effects.  The patient also shows a significant degree of executive functioning deficits including focus execute abilities, difficulty shifting attention and adjusting to environmental cues, and deficits with regard to problem solving and effective development and execution of new strategies.  Impression/Diagnosis:                     Overall, the results of the current neuropsychological evaluation in conjunction with clinical data are not consistent with patterns typically seen with degenerative cortical dementia such as Alzheimer's or Lewy body.  While the patient has had some identified tremor and clearly has bilateral fine motor control issues and also experiences of mostly nighttime visual hallucinations with some periods of auditory hallucinations other patterns are not particularly consistent with Lewy body dementia.  The patient's delayed/long-term memory appears to be generally efficient if information is initially stored.  The patient has very good encoding abilities but has difficulty transferring information from initial encoding storage to short-term memory stores which are primarily associated with hippocampal functions.  However, if the information is adequately transferred  into the short-term memory stores it is retained over time and is available for later recall.  Subjective reports suggest that his long-term memory and  intermediate memories are doing fairly well.  The patient's visual spatial and visual constructional abilities are intact without indication of deficits beyond those associated with fine motor control.  The patient is showing significant deficits with regard to executive functioning, problem-solving and cognitive shifting abilities.  This would suggest some frontal involvement in his difficulties.  Overall, the most likely explanation for the patient's difficulties are combination of progressing small vessel disease and microvascular ischemic changes in conjunction with significant sleep difficulties and disorder around sleep apnea.  While the patient is using his CPAP device at night he is also continuing to sleep significant amounts during the day.  The patient and his wife report that he does not use a CPAP device during the day which may be causing some significant difficulties in his excessive sleepiness at night suggest that there may be some inefficiencies with the CPAP device when he is using it.  While the most likely explanations for the patient's difficulties are related to previously identified issues of his microvascular ischemic changes particularly subcortical white matter tracts leading to frontal lobe regions as well as subcortical connections of the hippocampus region and likely other regions.  However, at this point we cannot completely rule out the possibility of Lewy body dementia but the patient has shown just mild ongoing progression of his symptoms over the past 5 years with description of rather sudden onset identification of symptoms after bowel obstruction surgery 5 years ago.  The patient is also having some mood changes related to dysphoria and depression which at one level is expected given his clear awareness of his loss of function but also may be indicative of some of these subcortical changes due to microvascular ischemia progressing.  To more definitively differentiate  between possible Lewy body versus ongoing deterioration from microvascular ischemic changes it would be appropriate to retest the patient in approximately 9 months to assess the type of an degree of progressive changes over the next 9 months.  It will be imperative that the patient take care of basic nutritional and physical activity functions and maintain active vigilance over his early stages of type 2 diabetes.  Metabolic functions, lack of physical activity and inconsistencies in nutritional patterns likely will exacerbate his underlying microvascular ischemic changes.  Diagnosis:                               Axis I: Cerebral microvascular disease  Cognitive and neurobehavioral dysfunction  Visual hallucination  Obstructive sleep apnea on CPAP   Ilean Skill, Psy.D. Neuropsychologist

## 2020-01-06 DIAGNOSIS — I252 Old myocardial infarction: Secondary | ICD-10-CM | POA: Diagnosis not present

## 2020-01-06 DIAGNOSIS — E119 Type 2 diabetes mellitus without complications: Secondary | ICD-10-CM | POA: Diagnosis not present

## 2020-01-06 DIAGNOSIS — E785 Hyperlipidemia, unspecified: Secondary | ICD-10-CM | POA: Diagnosis not present

## 2020-01-06 DIAGNOSIS — N400 Enlarged prostate without lower urinary tract symptoms: Secondary | ICD-10-CM | POA: Diagnosis not present

## 2020-01-06 DIAGNOSIS — I2510 Atherosclerotic heart disease of native coronary artery without angina pectoris: Secondary | ICD-10-CM | POA: Diagnosis not present

## 2020-01-06 DIAGNOSIS — J40 Bronchitis, not specified as acute or chronic: Secondary | ICD-10-CM | POA: Diagnosis not present

## 2020-01-06 DIAGNOSIS — I4891 Unspecified atrial fibrillation: Secondary | ICD-10-CM | POA: Diagnosis not present

## 2020-01-06 DIAGNOSIS — I10 Essential (primary) hypertension: Secondary | ICD-10-CM | POA: Diagnosis not present

## 2020-01-06 DIAGNOSIS — F0390 Unspecified dementia without behavioral disturbance: Secondary | ICD-10-CM | POA: Diagnosis not present

## 2020-01-06 DIAGNOSIS — I209 Angina pectoris, unspecified: Secondary | ICD-10-CM | POA: Diagnosis not present

## 2020-01-12 ENCOUNTER — Ambulatory Visit (INDEPENDENT_AMBULATORY_CARE_PROVIDER_SITE_OTHER): Payer: Medicare Other | Admitting: Family Medicine

## 2020-01-12 ENCOUNTER — Encounter: Payer: Self-pay | Admitting: Family Medicine

## 2020-01-12 ENCOUNTER — Other Ambulatory Visit: Payer: Self-pay

## 2020-01-12 VITALS — BP 153/71 | HR 59 | Temp 97.0°F | Ht 66.0 in | Wt 171.0 lb

## 2020-01-12 DIAGNOSIS — R413 Other amnesia: Secondary | ICD-10-CM | POA: Diagnosis not present

## 2020-01-12 DIAGNOSIS — I6789 Other cerebrovascular disease: Secondary | ICD-10-CM

## 2020-01-12 DIAGNOSIS — R443 Hallucinations, unspecified: Secondary | ICD-10-CM | POA: Diagnosis not present

## 2020-01-12 DIAGNOSIS — R2681 Unsteadiness on feet: Secondary | ICD-10-CM

## 2020-01-12 NOTE — Patient Instructions (Signed)
We will reorder MRI to compare to 2018 as you continue to have hallucinations.   Discuss continuing Aricept with PCP. I feel that it would be reasonable to wean off to see if you note any difference.   Follow up closely with PCP to manage co morbidities. Heart healthy diet and regular activity advised. Use CPAP if sleeping.   Follow up with me in 6 months   Memory Compensation Strategies  1. Use "WARM" strategy.  W= write it down  A= associate it  R= repeat it  M= make a mental note  2.   You can keep a Social worker.  Use a 3-ring notebook with sections for the following: calendar, important names and phone numbers,  medications, doctors' names/phone numbers, lists/reminders, and a section to journal what you did  each day.   3.    Use a calendar to write appointments down.  4.    Write yourself a schedule for the day.  This can be placed on the calendar or in a separate section of the Memory Notebook.  Keeping a  regular schedule can help memory.  5.    Use medication organizer with sections for each day or morning/evening pills.  You may need help loading it  6.    Keep a basket, or pegboard by the door.  Place items that you need to take out with you in the basket or on the pegboard.  You may also want to  include a message board for reminders.  7.    Use sticky notes.  Place sticky notes with reminders in a place where the task is performed.  For example: " turn off the  stove" placed by the stove, "lock the door" placed on the door at eye level, " take your medications" on  the bathroom mirror or by the place where you normally take your medications.  8.    Use alarms/timers.  Use while cooking to remind yourself to check on food or as a reminder to take your medicine, or as a  reminder to make a call, or as a reminder to perform another task, etc.   Sleep Apnea Sleep apnea affects breathing during sleep. It causes breathing to stop for a short time or to become  shallow. It can also increase the risk of:  Heart attack.  Stroke.  Being very overweight (obese).  Diabetes.  Heart failure.  Irregular heartbeat. The goal of treatment is to help you breathe normally again. What are the causes? There are three kinds of sleep apnea:  Obstructive sleep apnea. This is caused by a blocked or collapsed airway.  Central sleep apnea. This happens when the brain does not send the right signals to the muscles that control breathing.  Mixed sleep apnea. This is a combination of obstructive and central sleep apnea. The most common cause of this condition is a collapsed or blocked airway. This can happen if:  Your throat muscles are too relaxed.  Your tongue and tonsils are too large.  You are overweight.  Your airway is too small. What increases the risk?  Being overweight.  Smoking.  Having a small airway.  Being older.  Being male.  Drinking alcohol.  Taking medicines to calm yourself (sedatives or tranquilizers).  Having family members with the condition. What are the signs or symptoms?  Trouble staying asleep.  Being sleepy or tired during the day.  Getting angry a lot.  Loud snoring.  Headaches in the morning.  Not  being able to focus your mind (concentrate).  Forgetting things.  Less interest in sex.  Mood swings.  Personality changes.  Feelings of sadness (depression).  Waking up a lot during the night to pee (urinate).  Dry mouth.  Sore throat. How is this diagnosed?  Your medical history.  A physical exam.  A test that is done when you are sleeping (sleep study). The test is most often done in a sleep lab but may also be done at home. How is this treated?   Sleeping on your side.  Using a medicine to get rid of mucus in your nose (decongestant).  Avoiding the use of alcohol, medicines to help you relax, or certain pain medicines (narcotics).  Losing weight, if needed.  Changing your  diet.  Not smoking.  Using a machine to open your airway while you sleep, such as: ? An oral appliance. This is a mouthpiece that shifts your lower jaw forward. ? A CPAP device. This device blows air through a mask when you breathe out (exhale). ? An EPAP device. This has valves that you put in each nostril. ? A BPAP device. This device blows air through a mask when you breathe in (inhale) and breathe out.  Having surgery if other treatments do not work. It is important to get treatment for sleep apnea. Without treatment, it can lead to:  High blood pressure.  Coronary artery disease.  In men, not being able to have an erection (impotence).  Reduced thinking ability. Follow these instructions at home: Lifestyle  Make changes that your doctor recommends.  Eat a healthy diet.  Lose weight if needed.  Avoid alcohol, medicines to help you relax, and some pain medicines.  Do not use any products that contain nicotine or tobacco, such as cigarettes, e-cigarettes, and chewing tobacco. If you need help quitting, ask your doctor. General instructions  Take over-the-counter and prescription medicines only as told by your doctor.  If you were given a machine to use while you sleep, use it only as told by your doctor.  If you are having surgery, make sure to tell your doctor you have sleep apnea. You may need to bring your device with you.  Keep all follow-up visits as told by your doctor. This is important. Contact a doctor if:  The machine that you were given to use during sleep bothers you or does not seem to be working.  You do not get better.  You get worse. Get help right away if:  Your chest hurts.  You have trouble breathing in enough air.  You have an uncomfortable feeling in your back, arms, or stomach.  You have trouble talking.  One side of your body feels weak.  A part of your face is hanging down. These symptoms may be an emergency. Do not wait to see if  the symptoms will go away. Get medical help right away. Call your local emergency services (911 in the [place] Do not drive yourself to the hospital. Summary  This condition affects breathing during sleep.  The most common cause is a collapsed or blocked airway.  The goal of treatment is to help you breathe normally while you sleep. This information is not intended to replace advice given to you by your health care provider. Make sure you discuss any questions you have with your health care provider. Document Revised: 07/17/2018 Document Reviewed: 05/26/2018 [employer] Patient Education  [employer].

## 2020-01-12 NOTE — Progress Notes (Addendum)
PATIENT: Jonathan [name] DOB: 05/12/1931  REASON FOR VISIT: follow up HISTORY FROM: patient  Chief Complaint  Patient presents with  . Follow-up    room 1,  with wife, dementia     HISTORY OF PRESENT ILLNESS: Today 01/12/20 Jonathan [name] is a 86 y.o. male here today for follow up for memory loss. Formal neurocognitive testing was not consistent with AD, PD or LB dementia. He did have significant deficits with executive functioning, problem solving and cognitive shifting disabilities. He had a MRI in 2018 that showed chronic microvascular ischemia which is thought to be the most likely contributor. He continues Aricept 5mg  daily. He is having hallucinations daily. He sees children nearly every day. He is not frightened or having behavior changes. He continues Seroquel but does not think increasing or changing medications will be helpful at this time. He concerned that he is taking too many medications. He is followed closely by PCP. He was advised to consider PT therapy/aqua therapy by neuropsychology. He is scheduled to see PCP tomorrow. He does use CPAP at night. He does sleep a lot throughout the day but does not use CPAP therapy.    HISTORY: (copied from Dr Guadelupe Sabin note on 07/14/2019)  Jonathan [name] is an 86 year old right-handed gentleman with an underlyingcomplexmedical history of hypertension, A. fib, coronary artery disease, arthritis, borderline diabetes, reflux disease, status post multiple surgeries including bilateral hip arthroplasties, hernia repairs, cataract surgeries, corneal transplant, multiple EGDs, and mildly overweight state, and sleep apnea, who presents for a new problem visit for memory loss.  The patient is accompanied by his wife today.   I previously evaluated him for gait disorder and concern for parkinsonism at the request of his primary care physician on 07/14/2017.  I did not see any telltale signs of parkinsonism.  He had fallen, he was not using his walker at  the time and was encouraged to consistently use his walker due to fall risk.  I suggested we proceed with a brain MRI.  He had a brain MRI without contrast on 07/29/2017 and I reviewed the results:   IMPRESSION: Slightly abnormal MRI scan of brain showing age appropriate changes of mild chronic microvascular ischemia and generalized cerebral atrophy.   We called the patient and his wife at the time with the results.  Today, 07/14/2019: He reports very little on his own, his history is provided by his wife.  He has had memory loss for some years.  He has had no major behavioral changes but had some visual hallucinations.  He was placed on Seroquel by his primary care physician and also started on donepezil recently 5 mg strength.  She started to give him the donepezil on 06/27/2019.  He seems to tolerate it well.  He does not hydrate well with water, he estimates that he drinks water with meals only and typically not in between.  He reports that he has to go to the bathroom too much and too quickly after drinking anything.  He has not fallen recently.  He does not typically use his walker.  He uses CPAP at night, has an appointment with Dr. Maxwell Caul next month.  The patient's allergies, current medications, family history, past medical history, past social history, past surgical history and problem list were reviewed and updated as appropriate.   Previously:   07/14/2017: (He) reports recent difficulty with maneuvering stairs, he had difficulty finishing sentences and felt that he was drooling more. He is worried that he may have had  a stroke. He has memory loss for the past year or longer. I reviewed your office note from 06/02/2017. He had blood work at the time including B12 and TSH. He also had an A1c and BMP check at the time, we will request test results from your office., Which you kindly included. His wife reports that he has had motor problems for over one year, and attributes some of this  to fatigue and due to new onset a fib. This was noted in May, after a recent procedure of esophageal dilatation. He did not require cardioversion, but has had PAF.  He has fallen, has a walker, but does not use it. Wife has taken over the driving in the past month, as he recently got confused inside his neighborhood. During a recent ER visit she says, his heart rate was in the 40s.  He had a recent [place] wo contrast on 05/23/17, which I reviewed:IMPRESSION: No acute finding or explanation for symptoms. He had a high cervical Fx some years ago, saw Dr. Joya Salm at the time.   With respect to speech and gait instability, he and his wife report that he is better. He has new hearing aids too.   REVIEW OF SYSTEMS: Out of a complete 14 system review of symptoms, the patient complains only of the following symptoms, memory loss, hallucinations, fatigue, gait instability and all other reviewed systems are negative.  ALLERGIES: No Known Allergies  HOME MEDICATIONS: Outpatient Medications Prior to Visit  Medication Sig Dispense Refill  . amLODipine (NORVASC) 5 MG tablet Take 5 mg by mouth daily.    Marland Kitchen apixaban (ELIQUIS) 5 MG TABS tablet Take 1 tablet (5 mg total) by mouth 2 (two) times daily. 60 tablet 5  . atorvastatin (LIPITOR) 10 MG tablet Take 10 mg tablet by mouth Monday, Wednesday and friday    . colchicine 0.6 MG tablet TK 1 T PO Q 12 H PRF GOUT FLARE    . docusate sodium (STOOL SOFTENER) 100 MG capsule Take 200 mg by mouth at bedtime.    . donepezil (ARICEPT) 5 MG tablet Take 5 mg by mouth at bedtime.    . finasteride (PROSCAR) 5 MG tablet Take 5 mg by mouth every morning.     . halobetasol (ULTRAVATE) 0.05 % ointment Apply 1 application topically 2 (two) times daily as needed (itching or swelling). APPLY TWICE DAILY TO HANDS  0  . isosorbide mononitrate (IMDUR) 30 MG 24 hr tablet Take 30 mg by mouth daily with lunch.     . loratadine (ALLERGY) 10 MG tablet Take 10 mg by mouth at bedtime.     Marland Kitchen losartan (COZAAR) 50 MG tablet Take 50 mg by mouth at bedtime.     Marland Kitchen NITROSTAT 0.4 MG SL tablet Place 1 tablet (0.4 mg total) under the tongue every 5 (five) minutes as needed for chest pain. AS NEEDED FOR CHEST PAIN 25 tablet 6  . omeprazole (PRILOSEC) 20 MG capsule Take 20 mg by mouth daily.    Marland Kitchen oxybutynin (DITROPAN-XL) 10 MG 24 hr tablet Take 10 mg by mouth at bedtime.    Marland Kitchen QUEtiapine (SEROQUEL) 25 MG tablet TK 1 T PO QD HS     No facility-administered medications prior to visit.    PAST MEDICAL HISTORY: Past Medical History:  Diagnosis Date  . Acne rosacea   . Arthritis   . Atrial fibrillation ([place])   . Borderline diabetes   . CAD (coronary artery disease)   . Diabetes mellitus without complication (  HCC)   . Diverticulosis   . Elevated PSA   . Esophageal stricture   . GERD (gastroesophageal reflux disease)   . History of nuclear stress test    Nuclear stress test 6/18: EF 59, small apical defect (?old MI); no ischemia; Low Risk  . Hypertension   . Myocardial infarction ([place])   . Neck fracture ([place]) 2015  . PONV (postoperative nausea and vomiting) 08/09/2014  . Shoulder pain     PAST SURGICAL HISTORY: Past Surgical History:  Procedure Laterality Date  . BALLOON DILATION  04/02/2012   Procedure: BALLOON DILATION;  Surgeon: Arta Silence, MD;  Location: [facility] ENDOSCOPY;  Service: Endoscopy;  Laterality: N/A;  . BALLOON DILATION N/A 06/16/2013   Procedure: BALLOON DILATION;  Surgeon: Arta Silence, MD;  Location: [facility] ENDOSCOPY;  Service: Endoscopy;  Laterality: N/A;  . BALLOON DILATION N/A 11/16/2014   Procedure: BALLOON DILATION;  Surgeon: Arta Silence, MD;  Location: [facility] ENDOSCOPY;  Service: Endoscopy;  Laterality: N/A;  . BALLOON DILATION N/A 02/13/2017   Procedure: BALLOON DILATION;  Surgeon: Arta Silence, MD;  Location: [facility] ENDOSCOPY;  Service: Endoscopy;  Laterality: N/A;  . CORNEAL TRANSPLANT    . ESOPHAGOGASTRODUODENOSCOPY  04/02/2012   Procedure:  ESOPHAGOGASTRODUODENOSCOPY (EGD);  Surgeon: Arta Silence, MD;  Location: [facility];  Service: Endoscopy;  Laterality: N/A;  . ESOPHAGOGASTRODUODENOSCOPY N/A 05/28/2013   Procedure: ESOPHAGOGASTRODUODENOSCOPY (EGD);  Surgeon: Cleotis Nipper, MD;  Location: [facility] ENDOS[facility];  Service: Endoscopy;  Laterality: N/A;  . ESOPHAGOGASTRODUODENOSCOPY N/A 02/20/2015   Procedure: ESOPHAGOGASTRODUODENOSCOPY (EGD);  Surgeon: Arta Silence, MD;  Location: [facility] END[facility];  Service: Endoscopy;  Laterality: N/A;  . ESOPHAGOGASTRODUODENOSCOPY (EGD) WITH PROPOFOL N/A 06/16/2013   Procedure: ESOPHAGOGASTRODUODENOSCOPY (EGD) WITH PROPOFOL;  Surgeon: Arta Silence, MD;  Location: [facility] ENDOS[facility];  Service: Endoscopy;  Laterality: N/A;  . ESOPHAGOGASTRODUODENOSCOPY (EGD) WITH PROPOFOL N/A 11/16/2014   Procedure: ESOPHAGOGASTRODUODENOSCOPY (EGD) WITH PROPOFOL;  Surgeon: Arta Silence, MD;  Location: [facility];  Service: Endoscopy;  Laterality: N/A;  . ESOPHAGOGASTRODUODENOSCOPY (EGD) WITH PROPOFOL N/A 02/13/2017   Procedure: ESOPHAGOGASTRODUODENOSCOPY (EGD) WITH PROPOFOL;  Surgeon: Arta Silence, MD;  Location: [facility];  Service: Endoscopy;  Laterality: N/A;  . EYE SURGERY     cataract sx  . HERNIA REPAIR     x 2  . TOTAL HIP ARTHROPLASTY     bilateral    FAMILY HISTORY: Family History  Family history unknown: Yes    SOCIAL HISTORY: Social History   Socioeconomic History  . Marital status: Married    Spouse name: Not on file  . Number of children: Not on file  . Years of education: Not on file  . Highest education level: Not on file  Occupational History  . Not on file  Tobacco Use  . Smoking status: Never Smoker  . Smokeless tobacco: Never Used  Substance and Sexual Activity  . Alcohol use: No  . Drug use: No  . Sexual activity: Not on file  Other Topics Concern  . Not on file  Social History Narrative  . Not on file   Social Determinants of Health   Financial Resource Strain:   .  Difficulty of Paying Living Expenses:   Food Insecurity:   . Worried About Charity fundraiser in the Last Year:   . Arboriculturist in the Last Year:   Transportation Needs:   . Film/video editor (Medical):   Marland Kitchen Lack of Transportation (Non-Medical):   Physical Activity:   . Days of Exercise per Week:   .  Minutes of Exercise per Session:   Stress:   . Feeling of Stress :   Social Connections:   . Frequency of Communication with Friends and Family:   . Frequency of Social Gatherings with Friends and Family:   . Attends Religious Services:   . Active Member of Clubs or Organizations:   . Attends Archivist Meetings:   Marland Kitchen Marital Status:   Intimate Partner Violence:   . Fear of Current or Ex-Partner:   . Emotionally Abused:   Marland Kitchen Physically Abused:   . Sexually Abused:       PHYSICAL EXAM  Vitals:   01/12/20 1045  BP: (!) 153/71  Pulse: (!) 59  Temp: (!) 97 F (36.1 C)  Weight: 171 lb (77.6 kg)  Height: 5\' 6"  (1.676 m)   Body mass index is 27.6 kg/m.  Generalized: Well developed, in no acute distress  Cardiology: normal rate and rhythm, no murmur noted Respiratory: clear to auscultation bilaterally  Neurological examination  Mentation: Alert oriented to time, place, history taking. Follows all commands speech and language fluent Cranial nerve II-XII: Pupils were equal round reactive to light. Extraocular movements were full, visual field were full  Motor: The motor testing reveals 4 over 5 strength of all 4 extremities. Good symmetric motor tone is noted throughout.  Sensory: Sensory testing is intact to soft touch on all 4 extremities. No evidence of extinction is noted.  Gait and station: Gait is mildly unstable, uses cane for stability. Tandem gait not attempted today.    DIAGNOSTIC DATA (LABS, IMAGING, TESTING) - I reviewed patient records, labs, notes, testing and imaging myself where available.  MMSE - Mini Mental State Exam 01/12/2020 07/14/2019   Orientation to time 4 3  Orientation to Place 4 3  Registration 3 3  Attention/ Calculation 3 1  Recall 2 3  Language- name 2 objects 2 2  Language- repeat 1 0  Language- follow 3 step command 3 2  Language- read & follow direction 1 1  Write a sentence 1 1  Copy design 0 1  Total score 24 20     Lab Results  Component Value Date   WBC 6.7 11/10/2019   HGB 13.6 11/10/2019   HCT 39.6 11/10/2019   MCV 85 11/10/2019   PLT 176 11/10/2019      Component Value Date/Time   NA 142 11/10/2019 1013   K 4.2 11/10/2019 1013   CL 107 (H) 11/10/2019 1013   CO2 23 11/10/2019 1013   GLUCOSE 81 11/10/2019 1013   GLUCOSE 103 (H) 08/29/2018 1238   BUN 18 11/10/2019 1013   CREATININE 1.04 11/10/2019 1013   CALCIUM 9.3 11/10/2019 1013   PROT 6.8 07/14/2019 1144   ALBUMIN 4.3 07/14/2019 1144   AST 23 07/14/2019 1144   ALT 13 07/14/2019 1144   ALKPHOS 87 07/14/2019 1144   BILITOT 0.6 07/14/2019 1144   GFRNONAA 65 11/10/2019 1013   GFRAA 75 11/10/2019 1013   No results found for: CHOL, HDL, LDLCALC, LDLDIRECT, TRIG, CHOLHDL Lab Results  Component Value Date   HGBA1C 5.8 (H) 07/14/2019   Lab Results  Component Value Date   VITAMINB12 375 07/14/2019   Lab Results  Component Value Date   TSH 0.983 07/14/2019     ASSESSMENT AND PLAN 86 y.o. year old male  has a past medical history of Acne rosacea, Arthritis, Atrial fibrillation ([place]), Borderline diabetes, CAD (coronary artery disease), Diabetes mellitus without complication ([place]), Diverticulosis, Elevated PSA, Esophageal stricture, GERD (  gastroesophageal reflux disease), History of nuclear stress test, Hypertension, Myocardial infarction [facility]), Neck fracture ([place]) (2015), PONV (postoperative nausea and vomiting) (08/09/2014), and Shoulder pain. here with     ICD-10-CM   1. Cerebral microvascular disease  I67.89   2. Memory loss  R41.3   3. Hallucination  R44.3   4. Gait instability  R26.81     Jonathan [name] continues to have  concerns of memory loss, hallucinations, and gait instability.  Formal neurocognitive evaluation was nonspecific for neurodegenerative dementia and seems to be more consistent with microvascular ischemia.  Last MRI in 2018 showed microvascular ischemia as well as general atrophy.  Hallucinations continue on a near daily basis.  Fortunately, he is not frightened or disrupted by these hallucinations.  He has not noted much of it yet with Aricept or Seroquel.  He and his wife both request to repeat imaging to assess for any change since 2018.  I feel this is a reasonable request and will place that order today.  We have discussed the change or increase the dose of medications, however, he and his wife both are hesitant at this time.  I have reviewed the neurocognitive evaluation results with both Mr. and Mrs. Jonathan [name].  I have encouraged him to discuss possible physical therapy referral with primary care.  I have also advised that he work on healthy lifestyle habits with well-balanced, heart healthy diet and regular physical activity.  We have reviewed memory compensation strategies.  Need for management of hypertension, hyperlipidemia and diabetes discussed with patient and his wife.  He will follow-up with [place] in 6 months, sooner if needed.  He verbalizes understanding and agreement with this plan.   No orders of the defined types were placed in this encounter.    No orders of the defined types were placed in this encounter.     I spent 40 minutes with the patient. 50% of this time was spent counseling and educating patient on plan of care and medications.    Debbora Presto, FNP-C 01/12/2020, 12:31 PM [facility] [address], [place], [place] [address] [phone]  I reviewed the above note and documentation by the Nurse Practitioner and agree with the history, exam, assessment and plan as outlined above. I was available for consultation. [name], MD, PhD [facility])

## 2020-01-13 DIAGNOSIS — F05 Delirium due to known physiological condition: Secondary | ICD-10-CM | POA: Diagnosis not present

## 2020-01-13 DIAGNOSIS — N400 Enlarged prostate without lower urinary tract symptoms: Secondary | ICD-10-CM | POA: Diagnosis not present

## 2020-01-13 DIAGNOSIS — G319 Degenerative disease of nervous system, unspecified: Secondary | ICD-10-CM | POA: Diagnosis not present

## 2020-01-13 DIAGNOSIS — F0390 Unspecified dementia without behavioral disturbance: Secondary | ICD-10-CM | POA: Diagnosis not present

## 2020-01-13 DIAGNOSIS — R350 Frequency of micturition: Secondary | ICD-10-CM | POA: Diagnosis not present

## 2020-01-17 ENCOUNTER — Telehealth: Payer: Self-pay | Admitting: Family Medicine

## 2020-01-17 NOTE — Telephone Encounter (Signed)
medicare/uhc Karie Fetch: NPR spoke to [facility] Ref # [id number] order sent to GI. They will reach out to the patient to schedule.

## 2020-01-31 DIAGNOSIS — H5203 Hypermetropia, bilateral: Secondary | ICD-10-CM | POA: Diagnosis not present

## 2020-01-31 DIAGNOSIS — Z947 Corneal transplant status: Secondary | ICD-10-CM | POA: Diagnosis not present

## 2020-01-31 DIAGNOSIS — H52203 Unspecified astigmatism, bilateral: Secondary | ICD-10-CM | POA: Diagnosis not present

## 2020-01-31 DIAGNOSIS — Z9889 Other specified postprocedural states: Secondary | ICD-10-CM | POA: Diagnosis not present

## 2020-01-31 DIAGNOSIS — Z961 Presence of intraocular lens: Secondary | ICD-10-CM | POA: Diagnosis not present

## 2020-01-31 DIAGNOSIS — H524 Presbyopia: Secondary | ICD-10-CM | POA: Diagnosis not present

## 2020-02-02 ENCOUNTER — Encounter: Payer: Self-pay | Admitting: Physical Therapy

## 2020-02-02 ENCOUNTER — Ambulatory Visit: Payer: Medicare Other | Attending: Internal Medicine | Admitting: Physical Therapy

## 2020-02-02 ENCOUNTER — Other Ambulatory Visit: Payer: Self-pay

## 2020-02-02 DIAGNOSIS — R296 Repeated falls: Secondary | ICD-10-CM | POA: Insufficient documentation

## 2020-02-02 DIAGNOSIS — M6281 Muscle weakness (generalized): Secondary | ICD-10-CM | POA: Insufficient documentation

## 2020-02-02 DIAGNOSIS — R2689 Other abnormalities of gait and mobility: Secondary | ICD-10-CM | POA: Diagnosis not present

## 2020-02-02 NOTE — Therapy (Signed)
[place] [place], [place], [address] Phone: [phone]   Fax:  [phone]  Physical Therapy Evaluation  Patient Details  Name: Jonathan [name] MRN: [id number] Date of Birth: 1931/06/23 Referring Provider (PT): Josetta Huddle   Encounter Date: 02/02/2020  PT End of Session - 02/02/20 1610    Visit Number  1    Date for PT Re-Evaluation  04/03/20    Authorization Type  MC    PT Start Time  1525    PT Stop Time  1610    PT Time Calculation (min)  45 min    Activity Tolerance  Patient tolerated treatment well    Behavior During Therapy  [facility] for tasks assessed/performed       Past Medical History:  Diagnosis Date  . Acne rosacea   . Arthritis   . Atrial fibrillation ([place])   . Borderline diabetes   . CAD (coronary artery disease)   . Diabetes mellitus without complication ([place])   . Diverticulosis   . Elevated PSA   . Esophageal stricture   . GERD (gastroesophageal reflux disease)   . History of nuclear stress test    Nuclear stress test 6/18: EF 59, small apical defect (?old MI); no ischemia; Low Risk  . Hypertension   . Myocardial infarction ([place])   . Neck fracture ([place]) 2015  . PONV (postoperative nausea and vomiting) 08/09/2014  . Shoulder pain     Past Surgical History:  Procedure Laterality Date  . BALLOON DILATION  04/02/2012   Procedure: BALLOON DILATION;  Surgeon: Arta Silence, MD;  Location: [facility];  Service: Endoscopy;  Laterality: N/A;  . BALLOON DILATION N/A 06/16/2013   Procedure: BALLOON DILATION;  Surgeon: Arta Silence, MD;  [facility];  Service: Endoscopy;  Laterality: N/A;  . BALLOON DILATION N/A 11/16/2014   Procedure: BALLOON DILATION;  Surgeon: Arta Silence, MD;  [facility];  Service: Endoscopy;  Laterality: N/A;  . BALLOON DILATION N/A 02/13/2017   Procedure: BALLOON DILATION;  Surgeon: Arta Silence, MD;  Location: [facility] ENDOSCOPY;  Service:  Endoscopy;  Laterality: N/A;  . CORNEAL TRANSPLANT    . ESOPHAGOGASTRODUODENOSCOPY  04/02/2012   Procedure: ESOPHAGOGASTRODUODENOSCOPY (EGD);  Surgeon: Arta Silence, MD;  Location: [facility];  Service: Endoscopy;  Laterality: N/A;  . ESOPHAGOGASTRODUODENOSCOPY N/A 05/28/2013   Procedure: ESOPHAGOGASTRODUODENOSCOPY (EGD);  Surgeon: Cleotis Nipper, MD;  Location: [facility];  Service: Endoscopy;  Laterality: N/A;  . ESOPHAGOGASTRODUODENOSCOPY N/A 02/20/2015   Procedure: ESOPHAGOGASTRODUODENOSCOPY (EGD);  Surgeon: Arta Silence, MD;  Location: [facility];  Service: Endoscopy;  Laterality: N/A;  . ESOPHAGOGASTRODUODENOSCOPY (EGD) WITH PROPOFOL N/A 06/16/2013   Procedure: ESOPHAGOGASTRODUODENOSCOPY (EGD) WITH PROPOFOL;  Surgeon: Arta Silence, MD;  [facility];  Service: Endoscopy;  Laterality: N/A;  . ESOPHAGOGASTRODUODENOSCOPY (EGD) WITH PROPOFOL N/A 11/16/2014   Procedure: ESOPHAGOGASTRODUODENOSCOPY (EGD) WITH PROPOFOL;  Surgeon: Arta Silence, MD;  [facility];  Service: Endoscopy;  Laterality: N/A;  . ESOPHAGOGASTRODUODENOSCOPY (EGD) WITH PROPOFOL N/A 02/13/2017   Procedure: ESOPHAGOGASTRODUODENOSCOPY (EGD) WITH PROPOFOL;  Surgeon: Arta Silence, MD;  [facility];  Service: Endoscopy;  Laterality: N/A;  . EYE SURGERY     cataract sx  . HERNIA REPAIR     x 2  . TOTAL HIP ARTHROPLASTY     bilateral    There were no vitals filed for this visit.   Subjective Assessment - 02/02/20 1528    Subjective  Patient and wife are present for evaluation, reports unsteady  on feet, weakness over the past year.  Patient reports that he stumbles a lot.  Wife reports about 2 falls in the past few months.  Patient reports that he has started to use a cane for balance    Pertinent History  MI, HTN, DM    Limitations  Lifting;Standing;House hold activities;Walking    Patient Stated Goals  be stronger, be more steady, no falls         [facility] PT Assessment - 02/02/20  0001      Assessment   Medical Diagnosis  weakness, unsteady gait    Referring Provider (PT)  Josetta Huddle    Onset Date/Surgical Date  01/02/20    Prior Therapy  no      Precautions   Precautions  None      Balance Screen   Has the patient fallen in the past 6 months  Yes    How many times?  2    Has the patient had a decrease in activity level because of a fear of falling?   Yes    Is the patient reluctant to leave their home because of a fear of falling?   No      Home Environment   Additional Comments  does the yardwork, a few steps into the home      Prior Function   Level of Independence  Independent    Vocation  Retired    Leisure  Insurance risk surveyor Comments  significant fwd head, rounded shoulders      ROM / Strength   AROM / PROM / Strength  AROM;Strength      AROM   Overall AROM Comments  has very minimal motion of the head and neck      Strength   Overall Strength Comments  Hips 4-/5, knees and ankles 4-/5, shoulders 4/5      Palpation   Palpation comment  mm atrophy      Bed Mobility   Bed Mobility  Supine to Sit    Supine to Sit  Moderate Assistance - Patient 50-74%      Transfers   Comments  uses backs of legs      Ambulation/Gait   Gait Comments  uses a SPC, slow, unsteady gait, has the most difficulty getting up from sitting,       Standardized Balance Assessment   Standardized Balance Assessment  Timed Up and Go Test;Berg Balance Test      Berg Balance Test   Sit to Stand  Able to stand using hands after several tries    Standing Unsupported  Able to stand safely 2 minutes    Sitting with Back Unsupported but Feet Supported on Floor or Stool  Able to sit safely and securely 2 minutes    Stand to Sit  Controls descent by using hands    Transfers  Able to transfer safely, definite need of hands    Standing Unsupported with Eyes Closed  Able to stand 10 seconds with supervision    Standing Unsupported with Feet  Together  Able to place feet together independently and stand for 1 minute with supervision    From Standing, Reach Forward with Outstretched Arm  Can reach forward >12 cm safely (5")    From Standing Position, Pick up Object from Floor  Unable to pick up shoe, but reaches 2-5 cm (1-2") from shoe and balances independently    From Standing Position, Turn to  Look Behind Over each Shoulder  Turn sideways only but maintains balance    Turn 360 Degrees  Able to turn 360 degrees safely but slowly    Standing Unsupported, Alternately Place Feet on Step/Stool  Able to complete >2 steps/needs minimal assist    Standing Unsupported, One Foot in [employer] balance while stepping or standing    Standing on One Leg  Tries to lift leg/unable to hold 3 seconds but remains standing independently    Total Score  33      Timed Up and Go Test   Normal TUG (seconds)  20    TUG Comments  no device, slight loss of balance                Objective measurements completed on examination: See above findings.              PT Education - 02/02/20 1609    Education Details  HEP for balance 3 way kicks, marches and side stepping    Person(s) Educated  Patient;Spouse    Methods  Explanation;Demonstration;Handout    Comprehension  Verbalized understanding       PT Short Term Goals - 02/02/20 1613      PT SHORT TERM GOAL #1   Title  independent with initial HEP    Time  2    Period  Weeks    Status  New        PT Long Term Goals - 02/02/20 1613      PT LONG TERM GOAL #1   Title  decrease TUG to 15 seconds    Time  8    Period  Weeks    Status  New      PT LONG TERM GOAL #2   Title  increase Berg to 42/56    Time  8    Period  Weeks    Status  New      PT LONG TERM GOAL #3   Title  increase LE stregnth to 4/5    Time  8    Period  Weeks    Status  New      PT LONG TERM GOAL #4   Title  understand fall risks at home    Time  8    Period  Weeks    Status  New              Plan - 02/02/20 1610    Clinical Impression Statement  Patient and wife report that over the past year he has been having some increased difficulty with walking, getting up from sitting and with bed mobility, the wife reports that she has to help him daily in and out of bed.  TUG was 20 seconds, Merrilee Jansky was 33/56 putting him at a high risk for falls.    Personal Factors and Comorbidities  Comorbidity 3+    Comorbidities  MI, DM, HTN, CAD    Stability/Clinical Decision Making  Evolving/Moderate complexity    Rehab Potential  Good    PT Frequency  2x / week    PT Duration  8 weeks    PT Treatment/Interventions  ADLs/Self Care Home Management;Gait training;Neuromuscular re-education;Balance training;Therapeutic exercise;Therapeutic activities;Functional mobility training;Stair training;Patient/family education;Manual techniques    PT Next Visit Plan  slowly start exercises and balance, look at bed mobility    Consulted and Agree with Plan of Care  Patient       Patient will benefit from skilled therapeutic intervention in  order to improve the following deficits and impairments:  Abnormal gait, Decreased range of motion, Difficulty walking, Decreased endurance, Decreased activity tolerance, Improper body mechanics, Impaired flexibility, Decreased balance, Decreased mobility, Decreased strength, Postural dysfunction  Visit Diagnosis: Muscle weakness (generalized) - Plan: PT plan of care cert/re-cert  Other abnormalities of gait and mobility - Plan: PT plan of care cert/re-cert  Repeated falls - Plan: PT plan of care cert/re-cert     Problem List Patient Active Problem List   Diagnosis Date Noted  . Obstructive sleep apnea on CPAP 10/05/2018  . Aspiration pneumonia due to gastric secretions ([place])   . Hypervolemia   . Hypokalemia 03/15/2018  . Hypomagnesemia 03/15/2018  . Bladder mass 03/15/2018  . Small bowel obstruction ([place]) 03/11/2018  . Diabetes mellitus ([place])  03/11/2018  . GERD (gastroesophageal reflux disease) 03/11/2018  . Esophageal stricture 03/11/2018  . SBO (small bowel obstruction) ([place])   . Symptomatic sinus bradycardia   . Paroxysmal atrial fibrillation ([place]) 02/14/2017  . Essential hypertension 02/14/2017  . CAD (coronary artery disease) 07/10/2015  . Fatigue 07/10/2015  . PONV (postoperative nausea and vomiting) 08/09/2014  . Brow ptosis 06/08/2013  . Dermatochalasis 09/23/2012  . Diabetes mellitus type 2, diet-controlled ([place]) 09/23/2012  . After-cataract 12/26/2011  . Bilateral dry eyes 12/26/2011  . Blepharitis 10/03/2011  . Status post corneal transplant 10/03/2011  . Pseudophakia, both eyes 08/09/2006    Sumner Boast., PT 02/02/2020, 4:16 PM  [address] [place] [address] [place], [place], [address] Phone: [phone]   Fax:  [phone]  Name: Jonathan [name] MRN: [id number] Date of Birth: 01-15-1931

## 2020-02-09 ENCOUNTER — Encounter: Payer: Self-pay | Admitting: Physical Therapy

## 2020-02-09 ENCOUNTER — Ambulatory Visit: Payer: Medicare Other | Admitting: Physical Therapy

## 2020-02-09 ENCOUNTER — Other Ambulatory Visit: Payer: Self-pay

## 2020-02-09 DIAGNOSIS — R2689 Other abnormalities of gait and mobility: Secondary | ICD-10-CM | POA: Diagnosis not present

## 2020-02-09 DIAGNOSIS — M6281 Muscle weakness (generalized): Secondary | ICD-10-CM | POA: Diagnosis not present

## 2020-02-09 DIAGNOSIS — R296 Repeated falls: Secondary | ICD-10-CM

## 2020-02-09 NOTE — Therapy (Signed)
[place] [address] [place], [place], [address] Phone: [phone]   Fax:  [phone]  Physical Therapy Treatment  Patient Details  Name: Jonathan [name] MRN: [id number] Date of Birth: 06/24/1931 Referring Provider (PT): Josetta Huddle   Encounter Date: 02/09/2020  PT End of Session - 02/09/20 0937    Visit Number  2    Date for PT Re-Evaluation  04/03/20    Authorization Type  MC    PT Start Time  0844    PT Stop Time  0933    PT Time Calculation (min)  49 min    Activity Tolerance  Patient tolerated treatment well    Behavior During Therapy  [facility] for tasks assessed/performed       Past Medical History:  Diagnosis Date  . Acne rosacea   . Arthritis   . Atrial fibrillation ([place])   . Borderline diabetes   . CAD (coronary artery disease)   . Diabetes mellitus without complication ([place])   . Diverticulosis   . Elevated PSA   . Esophageal stricture   . GERD (gastroesophageal reflux disease)   . History of nuclear stress test    Nuclear stress test 6/18: EF 59, small apical defect (?old MI); no ischemia; Low Risk  . Hypertension   . Myocardial infarction ([place])   . Neck fracture ([place]) 2015  . PONV (postoperative nausea and vomiting) 08/09/2014  . Shoulder pain     Past Surgical History:  Procedure Laterality Date  . BALLOON DILATION  04/02/2012   Procedure: BALLOON DILATION;  Surgeon: Arta Silence, MD;  [facility]: [facility];  Service: Endoscopy;  Laterality: N/A;  . BALLOON DILATION N/A 06/16/2013   Procedure: BALLOON DILATION;  Surgeon: Arta Silence, MD;  [facility];  Service: Endoscopy;  Laterality: N/A;  . BALLOON DILATION N/A 11/16/2014   Procedure: BALLOON DILATION;  Surgeon: Arta Silence, MD;  [facility];  Service: Endoscopy;  Laterality: N/A;  . BALLOON DILATION N/A 02/13/2017   Procedure: BALLOON DILATION;  Surgeon: Arta Silence, MD;  Location: [facility] ENDOSCOPY;  Service:  Endoscopy;  Laterality: N/A;  . CORNEAL TRANSPLANT    . ESOPHAGOGASTRODUODENOSCOPY  04/02/2012   Procedure: ESOPHAGOGASTRODUODENOSCOPY (EGD);  Surgeon: Arta Silence, MD;  Location: [facility];  Service: Endoscopy;  Laterality: N/A;  . ESOPHAGOGASTRODUODENOSCOPY N/A 05/28/2013   Procedure: ESOPHAGOGASTRODUODENOSCOPY (EGD);  Surgeon: Cleotis Nipper, MD;  Location: [facility];  Service: Endoscopy;  Laterality: N/A;  . ESOPHAGOGASTRODUODENOSCOPY N/A 02/20/2015   Procedure: ESOPHAGOGASTRODUODENOSCOPY (EGD);  Surgeon: Arta Silence, MD;  Location: [facility];  Service: Endoscopy;  Laterality: N/A;  . ESOPHAGOGASTRODUODENOSCOPY (EGD) WITH PROPOFOL N/A 06/16/2013   Procedure: ESOPHAGOGASTRODUODENOSCOPY (EGD) WITH PROPOFOL;  Surgeon: Arta Silence, MD;  [facility];  Service: Endoscopy;  Laterality: N/A;  . ESOPHAGOGASTRODUODENOSCOPY (EGD) WITH PROPOFOL N/A 11/16/2014   Procedure: ESOPHAGOGASTRODUODENOSCOPY (EGD) WITH PROPOFOL;  Surgeon: Arta Silence, MD;  [facility];  Service: Endoscopy;  Laterality: N/A;  . ESOPHAGOGASTRODUODENOSCOPY (EGD) WITH PROPOFOL N/A 02/13/2017   Procedure: ESOPHAGOGASTRODUODENOSCOPY (EGD) WITH PROPOFOL;  Surgeon: Arta Silence, MD;  Location: [facility];  Service: Endoscopy;  Laterality: N/A;  . EYE SURGERY     cataract sx  . HERNIA REPAIR     x 2  . TOTAL HIP ARTHROPLASTY     bilateral    There were no vitals filed for this visit.  Subjective Assessment - 02/09/20 0849    Subjective  Patient reports some difficulty with HEP, reports no recent falls  Currently in Pain?  No/denies                       [facility] Adult PT Treatment/Exercise - 02/09/20 0001      High Level Balance   High Level Balance Activities  Backward walking;Side stepping;Negotiating over obstacles    High Level Balance Comments  ball toss, ball toss on airex, head turns on airex      Exercises   Exercises  Knee/Hip      Knee/Hip Exercises: Aerobic    Nustep  level 5 x 6 minutes    Other Aerobic  UBE level 3 x 4 minutes      Knee/Hip Exercises: Machines for Strengthening   Cybex Knee Extension  5# 2x10    Cybex Knee Flexion  20# 2x10      Knee/Hip Exercises: Standing   Other Standing Knee Exercises  6" toe touches wiht SPC               PT Short Term Goals - 02/09/20 [id number]      PT SHORT TERM GOAL #1   Title  independent with initial HEP    Status  On-going        PT Long Term Goals - 02/02/20 1613      PT LONG TERM GOAL #1   Title  decrease TUG to 15 seconds    Time  8    Period  Weeks    Status  New      PT LONG TERM GOAL #2   Title  increase Berg to 42/56    Time  8    Period  Weeks    Status  New      PT LONG TERM GOAL #3   Title  increase LE stregnth to 4/5    Time  8    Period  Weeks    Status  New      PT LONG TERM GOAL #4   Title  understand fall risks at home    Time  8    Period  Weeks    Status  New            Plan - 02/09/20 [id number]    Clinical Impression Statement  Patient did well with the start of exercises, required cues to get full ROM and good mm contraction.  With the balance he required CGA for righting, he had the most diffiulty with any head turns on the airex, he is very stiff in the neck and really does not turn his head, I walked him out to the car and he had some staggering side to side with this.    PT Next Visit Plan  continue to work on gait and balance look at bed mobility    Consulted and Agree with Plan of Care  Patient       Patient will benefit from skilled therapeutic intervention in order to improve the following deficits and impairments:  Abnormal gait, Decreased range of motion, Difficulty walking, Decreased endurance, Decreased activity tolerance, Improper body mechanics, Impaired flexibility, Decreased balance, Decreased mobility, Decreased strength, Postural dysfunction  Visit Diagnosis: Muscle weakness (generalized)  Other abnormalities of gait and  mobility  Repeated falls     Problem List Patient Active Problem List   Diagnosis Date Noted  . Obstructive sleep apnea on CPAP 10/05/2018  . Aspiration pneumonia due to gastric secretions ([place])   . Hypervolemia   . Hypokalemia 03/15/2018  . Hypomagnesemia 03/15/2018  .  Bladder mass 03/15/2018  . Small bowel obstruction ([place]) 03/11/2018  . Diabetes mellitus ([place]) 03/11/2018  . GERD (gastroesophageal reflux disease) 03/11/2018  . Esophageal stricture 03/11/2018  . SBO (small bowel obstruction) ([place])   . Symptomatic sinus bradycardia   . Paroxysmal atrial fibrillation ([place]) 02/14/2017  . Essential hypertension 02/14/2017  . CAD (coronary artery disease) 07/10/2015  . Fatigue 07/10/2015  . PONV (postoperative nausea and vomiting) 08/09/2014  . Brow ptosis 06/08/2013  . Dermatochalasis 09/23/2012  . Diabetes mellitus type 2, diet-controlled ([place]) 09/23/2012  . After-cataract 12/26/2011  . Bilateral dry eyes 12/26/2011  . Blepharitis 10/03/2011  . Status post corneal transplant 10/03/2011  . Pseudophakia, both eyes 08/09/2006    Sumner Boast., PT 02/09/2020, 9:42 AM  [place] [place], [place], [address] Phone: [phone]   Fax:  [phone]  Name: Jonathan [name] MRN: [id number] Date of Birth: 1930-10-29

## 2020-02-11 ENCOUNTER — Encounter: Payer: Self-pay | Admitting: Physical Therapy

## 2020-02-11 ENCOUNTER — Other Ambulatory Visit: Payer: Self-pay

## 2020-02-11 ENCOUNTER — Ambulatory Visit: Payer: Medicare Other | Admitting: Physical Therapy

## 2020-02-11 DIAGNOSIS — R296 Repeated falls: Secondary | ICD-10-CM | POA: Diagnosis not present

## 2020-02-11 DIAGNOSIS — R2689 Other abnormalities of gait and mobility: Secondary | ICD-10-CM | POA: Diagnosis not present

## 2020-02-11 DIAGNOSIS — M6281 Muscle weakness (generalized): Secondary | ICD-10-CM

## 2020-02-11 NOTE — Therapy (Signed)
[place], [place], [address] Phone: [phone]   Fax:  [phone]  Physical Therapy Treatment  Patient Details  Name: Jonathan [name] MRN: [id number] Date of Birth: 1931-09-10 Referring Provider (PT): Josetta Huddle   Encounter Date: 02/11/2020  PT End of Session - 02/11/20 1007    Visit Number  3    Date for PT Re-Evaluation  04/03/20    Authorization Type  MC    PT Start Time  0842    PT Stop Time  0925    PT Time Calculation (min)  43 min    Activity Tolerance  Patient tolerated treatment well    Behavior During Therapy  [facility] for tasks assessed/performed       Past Medical History:  Diagnosis Date  . Acne rosacea   . Arthritis   . Atrial fibrillation ([place])   . Borderline diabetes   . CAD (coronary artery disease)   . Diabetes mellitus without complication ([place])   . Diverticulosis   . Elevated PSA   . Esophageal stricture   . GERD (gastroesophageal reflux disease)   . History of nuclear stress test    Nuclear stress test 6/18: EF 59, small apical defect (?old MI); no ischemia; Low Risk  . Hypertension   . Myocardial infarction ([place])   . Neck fracture ([place]) 2015  . PONV (postoperative nausea and vomiting) 08/09/2014  . Shoulder pain     Past Surgical History:  Procedure Laterality Date  . BALLOON DILATION  04/02/2012   Procedure: BALLOON DILATION;  Surgeon: Arta Silence, MD;  [facility];  Service: Endoscopy;  Laterality: N/A;  . BALLOON DILATION N/A 06/16/2013   Procedure: BALLOON DILATION;  Surgeon: Arta Silence, MD;  [facility];  Service: Endoscopy;  Laterality: N/A;  . BALLOON DILATION N/A 11/16/2014   Procedure: BALLOON DILATION;  Surgeon: Arta Silence, MD;  [facility];  Service: Endoscopy;  Laterality: N/A;  . BALLOON DILATION N/A 02/13/2017   Procedure: BALLOON DILATION;  Surgeon: Arta Silence, MD;  Location: [facility] ENDOSCOPY;  Service:  Endoscopy;  Laterality: N/A;  . CORNEAL TRANSPLANT    . ESOPHAGOGASTRODUODENOSCOPY  04/02/2012   Procedure: ESOPHAGOGASTRODUODENOSCOPY (EGD);  Surgeon: Arta Silence, MD;  Location: [facility];  Service: Endoscopy;  Laterality: N/A;  . ESOPHAGOGASTRODUODENOSCOPY N/A 05/28/2013   Procedure: ESOPHAGOGASTRODUODENOSCOPY (EGD);  Surgeon: Cleotis Nipper, MD;  Location: [facility];  Service: Endoscopy;  Laterality: N/A;  . ESOPHAGOGASTRODUODENOSCOPY N/A 02/20/2015   Procedure: ESOPHAGOGASTRODUODENOSCOPY (EGD);  Surgeon: Arta Silence, MD;  Location: [facility];  Service: Endoscopy;  Laterality: N/A;  . ESOPHAGOGASTRODUODENOSCOPY (EGD) WITH PROPOFOL N/A 06/16/2013   Procedure: ESOPHAGOGASTRODUODENOSCOPY (EGD) WITH PROPOFOL;  Surgeon: Arta Silence, MD;  [facility];  Service: Endoscopy;  Laterality: N/A;  . ESOPHAGOGASTRODUODENOSCOPY (EGD) WITH PROPOFOL N/A 11/16/2014   Procedure: ESOPHAGOGASTRODUODENOSCOPY (EGD) WITH PROPOFOL;  Surgeon: Arta Silence, MD;  [facility];  Service: Endoscopy;  Laterality: N/A;  . ESOPHAGOGASTRODUODENOSCOPY (EGD) WITH PROPOFOL N/A 02/13/2017   Procedure: ESOPHAGOGASTRODUODENOSCOPY (EGD) WITH PROPOFOL;  Surgeon: Arta Silence, MD;  Location: [facility] [facility];  Service: Endoscopy;  Laterality: N/A;  . EYE SURGERY     cataract sx  . HERNIA REPAIR     x 2  . TOTAL HIP ARTHROPLASTY     bilateral    There were no vitals filed for this visit.  Subjective Assessment - 02/11/20 0841    Subjective  Patietn reports he has been tired but no soreness, no  falls    Currently in Pain?  No/denies                       [facility] Adult PT Treatment/Exercise - 02/11/20 0001      Ambulation/Gait   Gait Comments  gait with SPC around the building with one rest break of about 3 minutes.  He needed Min A at one point when he looked up and started drifting to the right, he scraped the curb, in the hall he continueally bumped the wall on the right       High Level Balance   High Level Balance Activities  Backward walking;Side stepping;Negotiating over obstacles    High Level Balance Comments  stepping on and off airex, neeed Min A for loss of balance      Knee/Hip Exercises: Aerobic   Recumbent Bike  x 5 minutes    Nustep  level 5 x 6 minutes      Knee/Hip Exercises: Machines for Strengthening   Cybex Knee Extension  5# 2x10    Cybex Knee Flexion  20# 2x10               PT Short Term Goals - 02/11/20 1012      PT SHORT TERM GOAL #1   Title  independent with initial HEP    Status  Partially Met        PT Long Term Goals - 02/11/20 1012      PT LONG TERM GOAL #1   Title  decrease TUG to 15 seconds    Status  On-going            Plan - 02/11/20 1007    Clinical Impression Statement  Patient has difficulty with balance, he tends to list to the right with walking.  He has difficulty with stepping over objects forward and side to side, needs a lot of cues, outside today on pine needles he really needed a lot of cues to pick up his feet as he was getting a little tripped up.    PT Next Visit Plan  continue to work on gait and balance look at bed mobility    Consulted and Agree with Plan of Care  Patient       Patient will benefit from skilled therapeutic intervention in order to improve the following deficits and impairments:  Abnormal gait, Decreased range of motion, Difficulty walking, Decreased endurance, Decreased activity tolerance, Improper body mechanics, Impaired flexibility, Decreased balance, Decreased mobility, Decreased strength, Postural dysfunction  Visit Diagnosis: Muscle weakness (generalized)  Other abnormalities of gait and mobility  Repeated falls     Problem List Patient Active Problem List   Diagnosis Date Noted  . Obstructive sleep apnea on CPAP 10/05/2018  . Aspiration pneumonia due to gastric secretions ([place])   . Hypervolemia   . Hypokalemia 03/15/2018  . Hypomagnesemia  03/15/2018  . Bladder mass 03/15/2018  . Small bowel obstruction ([place]) 03/11/2018  . Diabetes mellitus ([place]) 03/11/2018  . GERD (gastroesophageal reflux disease) 03/11/2018  . Esophageal stricture 03/11/2018  . SBO (small bowel obstruction) ([place])   . Symptomatic sinus bradycardia   . Paroxysmal atrial fibrillation ([place]) 02/14/2017  . Essential hypertension 02/14/2017  . CAD (coronary artery disease) 07/10/2015  . Fatigue 07/10/2015  . PONV (postoperative nausea and vomiting) 08/09/2014  . Brow ptosis 06/08/2013  . Dermatochalasis 09/23/2012  . Diabetes mellitus type 2, diet-controlled ([place]) 09/23/2012  . After-cataract 12/26/2011  . Bilateral dry eyes 12/26/2011  .  Blepharitis 10/03/2011  . Status post corneal transplant 10/03/2011  . Pseudophakia, both eyes 08/09/2006    Sumner Boast., PT 02/11/2020, 10:13 AM  [address] [place], [place], [address] Phone: [phone]   Fax:  [phone]  Name: Jonathan [name] MRN: [id number] Date of Birth: [birth year]

## 2020-02-12 ENCOUNTER — Ambulatory Visit
Admission: RE | Admit: 2020-02-12 | Discharge: 2020-02-12 | Disposition: A | Discharge status: Home / Self Care | Payer: Medicare Other | Source: Ambulatory Visit | Attending: Family Medicine | Admitting: Family Medicine

## 2020-02-12 DIAGNOSIS — R443 Hallucinations, unspecified: Secondary | ICD-10-CM

## 2020-02-12 DIAGNOSIS — R413 Other amnesia: Secondary | ICD-10-CM

## 2020-02-12 DIAGNOSIS — R2681 Unsteadiness on feet: Secondary | ICD-10-CM

## 2020-02-12 DIAGNOSIS — I6789 Other cerebrovascular disease: Secondary | ICD-10-CM

## 2020-02-14 ENCOUNTER — Ambulatory Visit: Payer: Medicare Other | Attending: Internal Medicine | Admitting: Physical Therapy

## 2020-02-14 ENCOUNTER — Encounter: Payer: Self-pay | Admitting: Physical Therapy

## 2020-02-14 ENCOUNTER — Other Ambulatory Visit: Payer: Self-pay

## 2020-02-14 DIAGNOSIS — R2689 Other abnormalities of gait and mobility: Secondary | ICD-10-CM | POA: Diagnosis not present

## 2020-02-14 DIAGNOSIS — M6281 Muscle weakness (generalized): Secondary | ICD-10-CM | POA: Diagnosis not present

## 2020-02-14 DIAGNOSIS — R296 Repeated falls: Secondary | ICD-10-CM | POA: Insufficient documentation

## 2020-02-14 NOTE — Therapy (Signed)
[place], [place], [address] Phone: [phone]   Fax:  [phone]  Physical Therapy Treatment  Patient Details  Name: Jonathan [name] MRN: [id number] Date of Birth: [birth year] Referring Provider (PT): Josetta Huddle   Encounter Date: 02/14/2020  PT End of Session - 02/14/20 1133    Visit Number  4    Date for PT Re-Evaluation  04/03/20    Authorization Type  MC    PT Start Time  1016    PT Stop Time  1058    PT Time Calculation (min)  42 min    Activity Tolerance  Patient tolerated treatment well    Behavior During Therapy  [facility] for tasks assessed/performed       Past Medical History:  Diagnosis Date  . Acne rosacea   . Arthritis   . Atrial fibrillation ([place])   . Borderline diabetes   . CAD (coronary artery disease)   . Diabetes mellitus without complication ([place])   . Diverticulosis   . Elevated PSA   . Esophageal stricture   . GERD (gastroesophageal reflux disease)   . History of nuclear stress test    Nuclear stress test 6/18: EF 59, small apical defect (?old MI); no ischemia; Low Risk  . Hypertension   . Myocardial infarction ([place])   . Neck fracture ([place]) 2015  . PONV (postoperative nausea and vomiting) 08/09/2014  . Shoulder pain     Past Surgical History:  Procedure Laterality Date  . BALLOON DILATION  04/02/2012   Procedure: BALLOON DILATION;  Surgeon: Arta Silence, MD;  [facility];  Service: Endoscopy;  Laterality: N/A;  . BALLOON DILATION N/A 06/16/2013   Procedure: BALLOON DILATION;  Surgeon: Arta Silence, MD;  [facility];  Service: Endoscopy;  Laterality: N/A;  . BALLOON DILATION N/A 11/16/2014   Procedure: BALLOON DILATION;  Surgeon: Arta Silence, MD;  [facility];  Service: Endoscopy;  Laterality: N/A;  . BALLOON DILATION N/A 02/13/2017   Procedure: BALLOON DILATION;  Surgeon: Arta Silence, MD;  Location: [facility]OSCOPY;  Service:  Endoscopy;  Laterality: N/A;  . CORNEAL TRANSPLANT    . ESOPHAGOGASTRODUODENOSCOPY  04/02/2012   Procedure: ESOPHAGOGASTRODUODENOSCOPY (EGD);  Surgeon: Arta Silence, MD;  [facility];  Service: Endoscopy;  Laterality: N/A;  . ESOPHAGOGASTRODUODENOSCOPY N/A 05/28/2013   Procedure: ESOPHAGOGASTRODUODENOSCOPY (EGD);  Surgeon: Cleotis Nipper, MD;  [facility];  Service: Endoscopy;  Laterality: N/A;  . ESOPHAGOGASTRODUODENOSCOPY N/A 02/20/2015   Procedure: ESOPHAGOGASTRODUODENOSCOPY (EGD);  Surgeon: Arta Silence, MD;  Location[facility];  Service: Endoscopy;  Laterality: N/A;  . ESOPHAGOGASTRODUODENOSCOPY (EGD) WITH PROPOFOL N/A 06/16/2013   Procedure: ESOPHAGOGASTRODUODENOSCOPY (EGD) WITH PROPOFOL;  Surgeon: Arta Silence, MD;  [facility];  Service: Endoscopy;  Laterality: N/A;  . ESOPHAGOGASTRODUODENOSCOPY (EGD) WITH PROPOFOL N/A 11/16/2014   Procedure: ESOPHAGOGASTRODUODENOSCOPY (EGD) WITH PROPOFOL;  Surgeon: Arta Silence, MD;  [facility];  Service: Endoscopy;  Laterality: N/A;  . ESOPHAGOGASTRODUODENOSCOPY (EGD) WITH PROPOFOL N/A 02/13/2017   Procedure: ESOPHAGOGASTRODUODENOSCOPY (EGD) WITH PROPOFOL;  Surgeon: Arta Silence, MD;  Location: [facility];  Service: Endoscopy;  Laterality: N/A;  . EYE SURGERY     cataract sx  . HERNIA REPAIR     x 2  . TOTAL HIP ARTHROPLASTY     bilateral    There were no vitals filed for this visit.  Subjective Assessment - 02/14/20 1020    Subjective  Wife reports that they did some walking this weekend, no  falls    Currently in Pain?  No/denies                       [facility] Adult PT Treatment/Exercise - 02/14/20 0001      High Level Balance   High Level Balance Activities  Backward walking;Side stepping;Negotiating over obstacles    High Level Balance Comments  ball toss on solid surface, 6" toe touches with SPC      Knee/Hip Exercises: Aerobic   Recumbent Bike  x 5 minutes    Nustep   level 5 x 6 minutes      Knee/Hip Exercises: Machines for Strengthening   Cybex Knee Extension  5# 3x10    Cybex Knee Flexion  20# 3x10      Knee/Hip Exercises: Seated   Other Seated Knee/Hip Exercises  sit to stand weighted ball toss 2x5               PT Short Term Goals - 02/11/20 1012      PT SHORT TERM GOAL #1   Title  independent with initial HEP    Status  Partially Met        PT Long Term Goals - 02/11/20 1012      PT LONG TERM GOAL #1   Title  decrease TUG to 15 seconds    Status  On-going            Plan - 02/14/20 1133    Clinical Impression Statement  Patient fatigued with the increased from 2 sets to 3 sets.  He was a little more unsteady walking down the hall, still tends to go to the right and does not do correct sequencing with the cane, tends to hold it up and carry it.    PT Next Visit Plan  continue to work on gait and balance look at bed mobility    Consulted and Agree with Plan of Care  Patient       Patient will benefit from skilled therapeutic intervention in order to improve the following deficits and impairments:  Abnormal gait, Decreased range of motion, Difficulty walking, Decreased endurance, Decreased activity tolerance, Improper body mechanics, Impaired flexibility, Decreased balance, Decreased mobility, Decreased strength, Postural dysfunction  Visit Diagnosis: Muscle weakness (generalized)  Other abnormalities of gait and mobility  Repeated falls     Problem List Patient Active Problem List   Diagnosis Date Noted  . Obstructive sleep apnea on CPAP 10/05/2018  . Aspiration pneumonia due to gastric secretions ([place])   . Hypervolemia   . Hypokalemia 03/15/2018  . Hypomagnesemia 03/15/2018  . Bladder mass 03/15/2018  . Small bowel obstruction ([place]) 03/11/2018  . Diabetes mellitus ([place]) 03/11/2018  . GERD (gastroesophageal reflux disease) 03/11/2018  . Esophageal stricture 03/11/2018  . SBO (small bowel obstruction)  ([place])   . Symptomatic sinus bradycardia   . Paroxysmal atrial fibrillation ([place]) 02/14/2017  . Essential hypertension 02/14/2017  . CAD (coronary artery disease) 07/10/2015  . Fatigue 07/10/2015  . PONV (postoperative nausea and vomiting) 08/09/2014  . Brow ptosis 06/08/2013  . Dermatochalasis 09/23/2012  . Diabetes mellitus type 2, diet-controlled ([place]) 09/23/2012  . After-cataract 12/26/2011  . Bilateral dry eyes 12/26/2011  . Blepharitis 10/03/2011  . Status post corneal transplant 10/03/2011  . Pseudophakia, both eyes 08/09/2006    Sumner Boast., PT 02/14/2020, 11:35 AM  [address] [place], [place], [address] Phone: [phone]   Fax:  [phone]  Name: Jonathan [name] MRN: [id number] Date of Birth: Jul 18, 1931

## 2020-02-16 ENCOUNTER — Telehealth: Payer: Self-pay | Admitting: *Deleted

## 2020-02-16 ENCOUNTER — Other Ambulatory Visit: Payer: Self-pay

## 2020-02-16 ENCOUNTER — Encounter: Payer: Self-pay | Admitting: Podiatry

## 2020-02-16 ENCOUNTER — Ambulatory Visit (INDEPENDENT_AMBULATORY_CARE_PROVIDER_SITE_OTHER): Payer: Medicare Other | Admitting: Podiatry

## 2020-02-16 VITALS — Temp 97.1°F

## 2020-02-16 DIAGNOSIS — M79676 Pain in unspecified toe(s): Secondary | ICD-10-CM

## 2020-02-16 DIAGNOSIS — E119 Type 2 diabetes mellitus without complications: Secondary | ICD-10-CM

## 2020-02-16 DIAGNOSIS — B351 Tinea unguium: Secondary | ICD-10-CM

## 2020-02-16 DIAGNOSIS — D689 Coagulation defect, unspecified: Secondary | ICD-10-CM

## 2020-02-16 NOTE — Progress Notes (Signed)
This patient returns to my office for at risk foot care.  This patient requires this care by a professional since this patient will be at risk due to having coagulation defect and diabetes.  Patient is taking eliquiss. This patient is unable to cut nails himself since the patient cannot reach his nails.These nails are painful walking and wearing shoes.  This patient presents for at risk foot care today.  General Appearance  Alert, conversant and in no acute stress.  Vascular  Dorsalis pedis and posterior tibial  pulses are palpable  bilaterally.  Capillary return is within normal limits  bilaterally. Temperature is within normal limits  bilaterally.  Neurologic  Senn-Weinstein monofilament wire test within normal limits  bilaterally. Muscle power within normal limits bilaterally.  Nails Thick disfigured discolored nails with subungual debris  from hallux to fifth toes bilaterally. No evidence of bacterial infection or drainage bilaterally.  Orthopedic  No limitations of motion  feet .  No crepitus or effusions noted.  No bony pathology or digital deformities noted.  Skin  normotropic skin with no porokeratosis noted bilaterally.  No signs of infections or ulcers noted.     Onychomycosis  Pain in right toes  Pain in left toes  Consent was obtained for treatment procedures.   Mechanical debridement of nails 1-5  bilaterally performed with a nail nipper.  Filed with dremel without incident.    Return office visit     3 months                 Told patient to return for periodic foot care and evaluation due to potential at risk complications.   Gardiner Barefoot DPM

## 2020-02-16 NOTE — Telephone Encounter (Signed)
Spoke with wife on [place], and informed her that his MRI brain was unchanged from 2018. We still see microvascular changes and generalized atrophy. There is no mention of significant worsening or changes since 2018 to explain hallucinations. Amy recommends continuing Aricept for now. She would like to monitor closely. Advised wife call with any changes or worsening symptoms. Patient's wife verbalized understanding, appreciation.

## 2020-02-17 ENCOUNTER — Ambulatory Visit: Payer: Medicare Other | Admitting: Physical Therapy

## 2020-02-17 ENCOUNTER — Encounter: Payer: Self-pay | Admitting: Physical Therapy

## 2020-02-17 DIAGNOSIS — M6281 Muscle weakness (generalized): Secondary | ICD-10-CM

## 2020-02-17 DIAGNOSIS — R2689 Other abnormalities of gait and mobility: Secondary | ICD-10-CM

## 2020-02-17 DIAGNOSIS — R296 Repeated falls: Secondary | ICD-10-CM | POA: Diagnosis not present

## 2020-02-17 NOTE — Therapy (Signed)
[place], [place], [address] Phone: [phone]   Fax:  [phone]  Physical Therapy Treatment  Patient Details  Name: Jonathan [name] MRN: [id number] Date of Birth: 1931/03/27 Referring Provider (PT): Josetta Huddle   Encounter Date: 02/17/2020  PT End of Session - 02/17/20 1410    Visit Number  5    Date for PT Re-Evaluation  04/03/20    Authorization Type  MC    PT Start Time  7253    PT Stop Time  1400    PT Time Calculation (min)  48 min    Activity Tolerance  Patient tolerated treatment well    Behavior During Therapy  [facility] for tasks assessed/performed       Past Medical History:  Diagnosis Date  . Acne rosacea   . Arthritis   . Atrial fibrillation ([place])   . Borderline diabetes   . CAD (coronary artery disease)   . Diabetes mellitus without complication ([place])   . Diverticulosis   . Elevated PSA   . Esophageal stricture   . GERD (gastroesophageal reflux disease)   . History of nuclear stress test    Nuclear stress test 6/18: EF 59, small apical defect (?old MI); no ischemia; Low Risk  . Hypertension   . Myocardial infarction ([place])   . Neck fracture ([place]) 2015  . PONV (postoperative nausea and vomiting) 08/09/2014  . Shoulder pain     Past Surgical History:  Procedure Laterality Date  . BALLOON DILATION  04/02/2012   Procedure: BALLOON DILATION;  Surgeon: Arta Silence, MD;  [facility];  Service: Endoscopy;  Laterality: N/A;  . BALLOON DILATION N/A 06/16/2013   Procedure: BALLOON DILATION;  Surgeon: Arta Silence, MD;  [facility];  Service: Endoscopy;  Laterality: N/A;  . BALLOON DILATION N/A 11/16/2014   Procedure: BALLOON DILATION;  Surgeon: Arta Silence, MD;  [facility];  Service: Endoscopy;  Laterality: N/A;  . BALLOON DILATION N/A 02/13/2017   Procedure: BALLOON DILATION;  Surgeon: Arta Silence, MD;  Location: [facility] ENDOSCOPY;  Service:  Endoscopy;  Laterality: N/A;  . CORNEAL TRANSPLANT    . ESOPHAGOGASTRODUODENOSCOPY  04/02/2012   Procedure: ESOPHAGOGASTRODUODENOSCOPY (EGD);  Surgeon: Arta Silence, MD;  [facility];  Service: Endoscopy;  Laterality: N/A;  . ESOPHAGOGASTRODUODENOSCOPY N/A 05/28/2013   Procedure: ESOPHAGOGASTRODUODENOSCOPY (EGD);  Surgeon: Cleotis Nipper, MD;  [facility];  Service: Endoscopy;  Laterality: N/A;  . ESOPHAGOGASTRODUODENOSCOPY N/A 02/20/2015   Procedure: ESOPHAGOGASTRODUODENOSCOPY (EGD);  Surgeon: Arta Silence, MD;  Location[facility];  Service: Endoscopy;  Laterality: N/A;  . ESOPHAGOGASTRODUODENOSCOPY (EGD) WITH PROPOFOL N/A 06/16/2013   Procedure: ESOPHAGOGASTRODUODENOSCOPY (EGD) WITH PROPOFOL;  Surgeon: Arta Silence, MD;  [facility];  Service: Endoscopy;  Laterality: N/A;  . ESOPHAGOGASTRODUODENOSCOPY (EGD) WITH PROPOFOL N/A 11/16/2014   Procedure: ESOPHAGOGASTRODUODENOSCOPY (EGD) WITH PROPOFOL;  Surgeon: Arta Silence, MD;  [facility];  Service: Endoscopy;  Laterality: N/A;  . ESOPHAGOGASTRODUODENOSCOPY (EGD) WITH PROPOFOL N/A 02/13/2017   Procedure: ESOPHAGOGASTRODUODENOSCOPY (EGD) WITH PROPOFOL;  Surgeon: Arta Silence, MD;  Location: [facility];  Service: Endoscopy;  Laterality: N/A;  . EYE SURGERY     cataract sx  . HERNIA REPAIR     x 2  . TOTAL HIP ARTHROPLASTY     bilateral    There were no vitals filed for this visit.  Subjective Assessment - 02/17/20 1320    Subjective  Patient reports that he is wore out, I did my  exercises today    Currently in Pain?  No/denies                       [facility] Adult PT Treatment/Exercise - 02/17/20 0001      High Level Balance   High Level Balance Activities  Backward walking;Side stepping;Negotiating over obstacles    High Level Balance Comments  ball toss on solid surface, 6" toe touches with SPC      Knee/Hip Exercises: Aerobic   Recumbent Bike  x 5 minutes    Nustep   level 5 x 7 minutes      Knee/Hip Exercises: Machines for Strengthening   Cybex Knee Extension  5# 3x10    Cybex Knee Flexion  20# 3x10    Cybex Leg Press  20# 3x10               PT Short Term Goals - 02/11/20 1012      PT SHORT TERM GOAL #1   Title  independent with initial HEP    Status  Partially Met        PT Long Term Goals - 02/17/20 1411      PT LONG TERM GOAL #2   Title  increase Berg to 42/56    Status  On-going      PT LONG TERM GOAL #3   Title  increase LE stregnth to 4/5    Status  On-going      PT LONG TERM GOAL #4   Title  understand fall risks at home    Status  On-going            Plan - 02/17/20 1411    Clinical Impression Statement  Patient tolerated the exercises well and no issues with the leg press, he did again hit the wall on his right numerous times as I walked him out to his car.    PT Next Visit Plan  continue to work on gait and balance    Consulted and Agree with Plan of Care  Patient       Patient will benefit from skilled therapeutic intervention in order to improve the following deficits and impairments:  Abnormal gait, Decreased range of motion, Difficulty walking, Decreased endurance, Decreased activity tolerance, Improper body mechanics, Impaired flexibility, Decreased balance, Decreased mobility, Decreased strength, Postural dysfunction  Visit Diagnosis: Muscle weakness (generalized)  Other abnormalities of gait and mobility  Repeated falls     Problem List Patient Active Problem List   Diagnosis Date Noted  . Obstructive sleep apnea on CPAP 10/05/2018  . Aspiration pneumonia due to gastric secretions ([place])   . Hypervolemia   . Hypokalemia 03/15/2018  . Hypomagnesemia 03/15/2018  . Bladder mass 03/15/2018  . Small bowel obstruction ([place]) 03/11/2018  . Diabetes mellitus ([place]) 03/11/2018  . GERD (gastroesophageal reflux disease) 03/11/2018  . Esophageal stricture 03/11/2018  . SBO (small bowel obstruction)  ([place])   . Symptomatic sinus bradycardia   . Paroxysmal atrial fibrillation ([place]) 02/14/2017  . Essential hypertension 02/14/2017  . CAD (coronary artery disease) 07/10/2015  . Fatigue 07/10/2015  . PONV (postoperative nausea and vomiting) 08/09/2014  . Brow ptosis 06/08/2013  . Dermatochalasis 09/23/2012  . Diabetes mellitus type 2, diet-controlled ([place]) 09/23/2012  . After-cataract 12/26/2011  . Bilateral dry eyes 12/26/2011  . Blepharitis 10/03/2011  . Status post corneal transplant 10/03/2011  . Pseudophakia, both eyes 08/09/2006    Sumner Boast., PT 02/17/2020, 2:12 PM  [place]  [place] [address] [place], [place], [address] Phone: [phone]   Fax:  [phone]  Name: Jonathan [name] MRN: [id number] Date of Birth: 27-Oct-1930

## 2020-02-22 ENCOUNTER — Other Ambulatory Visit: Payer: Self-pay

## 2020-02-22 ENCOUNTER — Encounter: Payer: Self-pay | Admitting: Physical Therapy

## 2020-02-22 ENCOUNTER — Ambulatory Visit: Payer: Medicare Other | Admitting: Physical Therapy

## 2020-02-22 DIAGNOSIS — R2689 Other abnormalities of gait and mobility: Secondary | ICD-10-CM

## 2020-02-22 DIAGNOSIS — M6281 Muscle weakness (generalized): Secondary | ICD-10-CM

## 2020-02-22 DIAGNOSIS — R296 Repeated falls: Secondary | ICD-10-CM | POA: Diagnosis not present

## 2020-02-22 NOTE — Therapy (Signed)
[place], [place], [address] Phone: [phone]   Fax:  [phone]  Physical Therapy Treatment  Patient Details  Name: Jonathan [name] MRN: [id number] Date of Birth: 10-27-1930 Referring Provider (PT): Josetta Huddle   Encounter Date: 02/22/2020  PT End of Session - 02/22/20 1102    Visit Number  6    Date for PT Re-Evaluation  04/03/20    Authorization Type  MC    PT Start Time  1012    PT Stop Time  1100    PT Time Calculation (min)  48 min    Activity Tolerance  Patient tolerated treatment well    Behavior During Therapy  [facility] for tasks assessed/performed       Past Medical History:  Diagnosis Date  . Acne rosacea   . Arthritis   . Atrial fibrillation ([place])   . Borderline diabetes   . CAD (coronary artery disease)   . Diabetes mellitus without complication ([place])   . Diverticulosis   . Elevated PSA   . Esophageal stricture   . GERD (gastroesophageal reflux disease)   . History of nuclear stress test    Nuclear stress test 6/18: EF 59, small apical defect (?old MI); no ischemia; Low Risk  . Hypertension   . Myocardial infarction ([place])   . Neck fracture ([place]) 2015  . PONV (postoperative nausea and vomiting) 08/09/2014  . Shoulder pain     Past Surgical History:  Procedure Laterality Date  . BALLOON DILATION  04/02/2012   Procedure: BALLOON DILATION;  Surgeon: Arta Silence, MD;  [facility];  Service: Endoscopy;  Laterality: N/A;  . BALLOON DILATION N/A 06/16/2013   Procedure: BALLOON DILATION;  Surgeon: Arta Silence, MD;  [facility];  Service: Endoscopy;  Laterality: N/A;  . BALLOON DILATION N/A 11/16/2014   Procedure: BALLOON DILATION;  Surgeon: Arta Silence, MD;  [facility];  Service: Endoscopy;  Laterality: N/A;  . BALLOON DILATION N/A 02/13/2017   Procedure: BALLOON DILATION;  Surgeon: Arta Silence, MD;  Location: [facility] ENDOSCOPY;  Service:  Endoscopy;  Laterality: N/A;  . CORNEAL TRANSPLANT    . ESOPHAGOGASTRODUODENOSCOPY  04/02/2012   Procedure: ESOPHAGOGASTRODUODENOSCOPY (EGD);  Surgeon: Arta Silence, MD;  Location: [facility];  Service: Endoscopy;  Laterality: N/A;  . ESOPHAGOGASTRODUODENOSCOPY N/A 05/28/2013   Procedure: ESOPHAGOGASTRODUODENOSCOPY (EGD);  Surgeon: Cleotis Nipper, MD;  Location: [facility];  Service: Endoscopy;  Laterality: N/A;  . ESOPHAGOGASTRODUODENOSCOPY N/A 02/20/2015   Procedure: ESOPHAGOGASTRODUODENOSCOPY (EGD);  Surgeon: Arta Silence, MD;  Location: [facility];  Service: Endoscopy;  Laterality: N/A;  . ESOPHAGOGASTRODUODENOSCOPY (EGD) WITH PROPOFOL N/A 06/16/2013   Procedure: ESOPHAGOGASTRODUODENOSCOPY (EGD) WITH PROPOFOL;  Surgeon: Arta Silence, MD;  [facility];  Service: Endoscopy;  Laterality: N/A;  . ESOPHAGOGASTRODUODENOSCOPY (EGD) WITH PROPOFOL N/A 11/16/2014   Procedure: ESOPHAGOGASTRODUODENOSCOPY (EGD) WITH PROPOFOL;  Surgeon: Arta Silence, MD;  [facility];  Service: Endoscopy;  Laterality: N/A;  . ESOPHAGOGASTRODUODENOSCOPY (EGD) WITH PROPOFOL N/A 02/13/2017   Procedure: ESOPHAGOGASTRODUODENOSCOPY (EGD) WITH PROPOFOL;  Surgeon: Arta Silence, MD;  Location: [place];  Service: Endoscopy;  Laterality: N/A;  . EYE SURGERY     cataract sx  . HERNIA REPAIR     x 2  . TOTAL HIP ARTHROPLASTY     bilateral    There were no vitals filed for this visit.  Subjective Assessment - 02/22/20 1016    Subjective  No falls, patient reports tired    Currently in  Pain?  No/denies                       [facility] Adult PT Treatment/Exercise - 02/22/20 0001      Ambulation/Gait   Gait Comments  gait on stairs one at a time down, but he tried step over step and this was out of control, I talked to him and his wife about safety with this, up he does well step over step, then gait out side on slopes and on some pine needles. cues for safety      High Level  Balance   High Level Balance Activities  Backward walking;Side stepping;Negotiating over obstacles    High Level Balance Comments  ball toss on solid surface, 6" toe touches with SPC      Knee/Hip Exercises: Aerobic   Recumbent Bike  x 5 minutes    Nustep  level 5 x 7 minutes               PT Short Term Goals - 02/11/20 1012      PT SHORT TERM GOAL #1   Title  independent with initial HEP    Status  Partially Met        PT Long Term Goals - 02/17/20 1411      PT LONG TERM GOAL #2   Title  increase Berg to 42/56    Status  On-going      PT LONG TERM GOAL #3   Title  increase LE stregnth to 4/5    Status  On-going      PT LONG TERM GOAL #4   Title  understand fall risks at home    Status  On-going            Plan - 02/22/20 1102    Clinical Impression Statement  Patient on stairs going down seems to get out of control and is unsafe trying step over step, I spoke with him and his wife about safety and to do one at a time.  With the 6" toe touches he had diffiuclty trying without cane.  Had some issues with stepping over objects, one time lost balance and required mod A to right    PT Next Visit Plan  continue to work on gait and balance    Consulted and Agree with Plan of Care  Patient       Patient will benefit from skilled therapeutic intervention in order to improve the following deficits and impairments:  Abnormal gait, Decreased range of motion, Difficulty walking, Decreased endurance, Decreased activity tolerance, Improper body mechanics, Impaired flexibility, Decreased balance, Decreased mobility, Decreased strength, Postural dysfunction  Visit Diagnosis: Muscle weakness (generalized)  Other abnormalities of gait and mobility  Repeated falls     Problem List Patient Active Problem List   Diagnosis Date Noted  . Obstructive sleep apnea on CPAP 10/05/2018  . Aspiration pneumonia due to gastric secretions ([place])   . Hypervolemia   . Hypokalemia  03/15/2018  . Hypomagnesemia 03/15/2018  . Bladder mass 03/15/2018  . Small bowel obstruction ([place]) 03/11/2018  . Diabetes mellitus ([place]) 03/11/2018  . GERD (gastroesophageal reflux disease) 03/11/2018  . Esophageal stricture 03/11/2018  . SBO (small bowel obstruction) ([place])   . Symptomatic sinus bradycardia   . Paroxysmal atrial fibrillation ([place]) 02/14/2017  . Essential hypertension 02/14/2017  . CAD (coronary artery disease) 07/10/2015  . Fatigue 07/10/2015  . PONV (postoperative nausea and vomiting) 08/09/2014  . Brow ptosis 06/08/2013  .  Dermatochalasis 09/23/2012  . Diabetes mellitus type 2, diet-controlled ([place]) 09/23/2012  . After-cataract 12/26/2011  . Bilateral dry eyes 12/26/2011  . Blepharitis 10/03/2011  . Status post corneal transplant 10/03/2011  . Pseudophakia, both eyes 08/09/2006    Sumner Boast., PT 02/22/2020, 11:06 AM  [place] [address] [place] [address] [place], [place], [address] Phone: [phone]   Fax:  [phone]  Name: Jonathan [name] MRN: [id number] Date of Birth: 1931/05/19

## 2020-02-24 ENCOUNTER — Encounter: Payer: Self-pay | Admitting: Physical Therapy

## 2020-02-24 ENCOUNTER — Ambulatory Visit: Payer: Medicare Other | Admitting: Psychology

## 2020-02-24 ENCOUNTER — Other Ambulatory Visit: Payer: Self-pay

## 2020-02-24 ENCOUNTER — Ambulatory Visit: Payer: Medicare Other | Admitting: Physical Therapy

## 2020-02-24 DIAGNOSIS — R2689 Other abnormalities of gait and mobility: Secondary | ICD-10-CM | POA: Diagnosis not present

## 2020-02-24 DIAGNOSIS — M6281 Muscle weakness (generalized): Secondary | ICD-10-CM | POA: Diagnosis not present

## 2020-02-24 DIAGNOSIS — R296 Repeated falls: Secondary | ICD-10-CM

## 2020-02-24 NOTE — Therapy (Signed)
[place], [place], [address] Phone: [phone]   Fax:  [phone]  Physical Therapy Treatment  Patient Details  Name: Jonathan [name] MRN: [id number] Date of Birth: December 02, 1930 Referring Provider (PT): Josetta Huddle   Encounter Date: 02/24/2020  PT End of Session - 02/24/20 1200    Visit Number  7    Date for PT Re-Evaluation  04/03/20    Authorization Type  MC    PT Start Time  1055    PT Stop Time  1145    PT Time Calculation (min)  50 min    Activity Tolerance  Patient tolerated treatment well    Behavior During Therapy  [facility] for tasks assessed/performed       Past Medical History:  Diagnosis Date  . Acne rosacea   . Arthritis   . Atrial fibrillation ([place])   . Borderline diabetes   . CAD (coronary artery disease)   . Diabetes mellitus without complication ([place])   . Diverticulosis   . Elevated PSA   . Esophageal stricture   . GERD (gastroesophageal reflux disease)   . History of nuclear stress test    Nuclear stress test 6/18: EF 59, small apical defect (?old MI); no ischemia; Low Risk  . Hypertension   . Myocardial infarction ([place])   . Neck fracture ([place]) 2015  . PONV (postoperative nausea and vomiting) 08/09/2014  . Shoulder pain     Past Surgical History:  Procedure Laterality Date  . BALLOON DILATION  04/02/2012   Procedure: BALLOON DILATION;  Surgeon: Arta Silence, MD;  [facility];  Service: Endoscopy;  Laterality: N/A;  . BALLOON DILATION N/A 06/16/2013   Procedure: BALLOON DILATION;  Surgeon: Arta Silence, MD;  [facility];  Service: Endoscopy;  Laterality: N/A;  . BALLOON DILATION N/A 11/16/2014   Procedure: BALLOON DILATION;  Surgeon: Arta Silence, MD;  [facility];  Service: Endoscopy;  Laterality: N/A;  . BALLOON DILATION N/A 02/13/2017   Procedure: BALLOON DILATION;  Surgeon: Arta Silence, MD;  Location: [facility] ENDOSCOPY;  Service:  Endoscopy;  Laterality: N/A;  . CORNEAL TRANSPLANT    . ESOPHAGOGASTRODUODENOSCOPY  04/02/2012   Procedure: ESOPHAGOGASTRODUODENOSCOPY (EGD);  Surgeon: Arta Silence, MD;  Location: [facility];  Service: Endoscopy;  Laterality: N/A;  . ESOPHAGOGASTRODUODENOSCOPY N/A 05/28/2013   Procedure: ESOPHAGOGASTRODUODENOSCOPY (EGD);  Surgeon: Cleotis Nipper, MD;  Location: [facility];  Service: Endoscopy;  Laterality: N/A;  . ESOPHAGOGASTRODUODENOSCOPY N/A 02/20/2015   Procedure: ESOPHAGOGASTRODUODENOSCOPY (EGD);  Surgeon: Arta Silence, MD;  Location: [facility];  Service: Endoscopy;  Laterality: N/A;  . ESOPHAGOGASTRODUODENOSCOPY (EGD) WITH PROPOFOL N/A 06/16/2013   Procedure: ESOPHAGOGASTRODUODENOSCOPY (EGD) WITH PROPOFOL;  Surgeon: Arta Silence, MD;  [facility];  Service: Endoscopy;  Laterality: N/A;  . ESOPHAGOGASTRODUODENOSCOPY (EGD) WITH PROPOFOL N/A 11/16/2014   Procedure: ESOPHAGOGASTRODUODENOSCOPY (EGD) WITH PROPOFOL;  Surgeon: Arta Silence, MD;  [facility];  Service: Endoscopy;  Laterality: N/A;  . ESOPHAGOGASTRODUODENOSCOPY (EGD) WITH PROPOFOL N/A 02/13/2017   Procedure: ESOPHAGOGASTRODUODENOSCOPY (EGD) WITH PROPOFOL;  Surgeon: Arta Silence, MD;  Location: [place];  Service: Endoscopy;  Laterality: N/A;  . EYE SURGERY     cataract sx  . HERNIA REPAIR     x 2  . TOTAL HIP ARTHROPLASTY     bilateral    There were no vitals filed for this visit.  Subjective Assessment - 02/24/20 1059    Subjective  Reports doing well, no problems, reports issues walking oin the  grass    Currently in Pain?  No/denies                        [facility] Adult PT Treatment/Exercise - 02/24/20 0001      Ambulation/Gait   Gait Comments  gait outside with a SPC, him mostly carrying the cane, negotiating curbs and some walking in the grass and on slopes, he reports that he feels unsteady doing this      High Level Balance   High Level Balance Activities   Negotiating over obstacles    High Level Balance Comments  ball toss on solid surface, 6" toe touches with SPC, on airex reaching      Knee/Hip Exercises: Aerobic   Recumbent Bike  x 7 minutes    Nustep  level 5 x 6 minutes      Knee/Hip Exercises: Machines for Strengthening   Cybex Knee Extension  5# 2x10 did one set with 10# and then back to 5#    Cybex Knee Flexion  20# 3x10    Cybex Leg Press  20# 3x10               PT Short Term Goals - 02/11/20 1012      PT SHORT TERM GOAL #1   Title  independent with initial HEP    Status  Partially Met        PT Long Term Goals - 02/24/20 1202      PT LONG TERM GOAL #1   Title  decrease TUG to 15 seconds    Status  On-going      PT LONG TERM GOAL #4   Title  understand fall risks at home    Status  Partially Met            Plan - 02/24/20 1200    Clinical Impression Statement  Patient did very well today, when leaving he did not hit the wall once.  He reports that his biggest issue is walking on the grass or other uneven surfaces, it at times is in a hurry and does not do well on the obstacles that I set up for him, but with cues to slow down he does very well, occasional mod A to right for loess of balance       Patient will benefit from skilled therapeutic intervention in order to improve the following deficits and impairments:  Abnormal gait, Decreased range of motion, Difficulty walking, Decreased endurance, Decreased activity tolerance, Improper body mechanics, Impaired flexibility, Decreased balance, Decreased mobility, Decreased strength, Postural dysfunction  Visit Diagnosis: Muscle weakness (generalized)  Other abnormalities of gait and mobility  Repeated falls     Problem List Patient Active Problem List   Diagnosis Date Noted  . Obstructive sleep apnea on CPAP 10/05/2018  . Aspiration pneumonia due to gastric secretions ([place])   . Hypervolemia   . Hypokalemia 03/15/2018  . Hypomagnesemia  03/15/2018  . Bladder mass 03/15/2018  . Small bowel obstruction ([place]) 03/11/2018  . Diabetes mellitus ([place]) 03/11/2018  . GERD (gastroesophageal reflux disease) 03/11/2018  . Esophageal stricture 03/11/2018  . SBO (small bowel obstruction) ([place])   . Symptomatic sinus bradycardia   . Paroxysmal atrial fibrillation ([place]) 02/14/2017  . Essential hypertension 02/14/2017  . CAD (coronary artery disease) 07/10/2015  . Fatigue 07/10/2015  . PONV (postoperative nausea and vomiting) 08/09/2014  . Brow ptosis 06/08/2013  . Dermatochalasis 09/23/2012  . Diabetes mellitus type 2, diet-controlled ([place]) 09/23/2012  .  After-cataract 12/26/2011  . Bilateral dry eyes 12/26/2011  . Blepharitis 10/03/2011  . Status post corneal transplant 10/03/2011  . Pseudophakia, both eyes 08/09/2006    Sumner Boast., PT 02/24/2020, 12:03 PM  [place] [place], [place], [address] Phone: [phone]   Fax:  [phone]  Name: Nicholus Chandran MRN: [id number] Date of Birth: 1931/05/05

## 2020-02-28 ENCOUNTER — Encounter: Payer: Self-pay | Admitting: Physical Therapy

## 2020-02-28 ENCOUNTER — Ambulatory Visit: Payer: Medicare Other | Admitting: Physical Therapy

## 2020-02-28 ENCOUNTER — Other Ambulatory Visit: Payer: Self-pay

## 2020-02-28 DIAGNOSIS — R296 Repeated falls: Secondary | ICD-10-CM | POA: Diagnosis not present

## 2020-02-28 DIAGNOSIS — M6281 Muscle weakness (generalized): Secondary | ICD-10-CM | POA: Diagnosis not present

## 2020-02-28 DIAGNOSIS — R2689 Other abnormalities of gait and mobility: Secondary | ICD-10-CM

## 2020-02-28 NOTE — Therapy (Signed)
[place], [place], [address] Phone: [phone]   Fax:  [phone]  Physical Therapy Treatment  Patient Details  Name: Jonathan [name] MRN: [id number] Date of Birth: [birth year] Referring Provider (PT): Josetta Huddle   Encounter Date: 02/28/2020  PT End of Session - 02/28/20 1148    Visit Number  8    Date for PT Re-Evaluation  04/03/20    Authorization Type  MC    PT Start Time  1100    PT Stop Time  1148    PT Time Calculation (min)  48 min    Activity Tolerance  Patient tolerated treatment well    Behavior During Therapy  [facility] for tasks assessed/performed       Past Medical History:  Diagnosis Date  . Acne rosacea   . Arthritis   . Atrial fibrillation ([place])   . Borderline diabetes   . CAD (coronary artery disease)   . Diabetes mellitus without complication ([place])   . Diverticulosis   . Elevated PSA   . Esophageal stricture   . GERD (gastroesophageal reflux disease)   . History of nuclear stress test    Nuclear stress test 6/18: EF 59, small apical defect (?old MI); no ischemia; Low Risk  . Hypertension   . Myocardial infarction ([place])   . Neck fracture ([place]) 2015  . PONV (postoperative nausea and vomiting) 08/09/2014  . Shoulder pain     Past Surgical History:  Procedure Laterality Date  . BALLOON DILATION  04/02/2012   Procedure: BALLOON DILATION;  Surgeon: Arta Silence, MD;  [facility];  Service: Endoscopy;  Laterality: N/A;  . BALLOON DILATION N/A 06/16/2013   Procedure: BALLOON DILATION;  Surgeon: Arta Silence, MD;  [facility];  Service: Endoscopy;  Laterality: N/A;  . BALLOON DILATION N/A 11/16/2014   Procedure: BALLOON DILATION;  Surgeon: Arta Silence, MD;  [facility];  Service: Endoscopy;  Laterality: N/A;  . BALLOON DILATION N/A 02/13/2017   Procedure: BALLOON DILATION;  Surgeon: Arta Silence, MD;  [facility];  Service:  Endoscopy;  Laterality: N/A;  . CORNEAL TRANSPLANT    . ESOPHAGOGASTRODUODENOSCOPY  04/02/2012   Procedure: ESOPHAGOGASTRODUODENOSCOPY (EGD);  Surgeon: Arta Silence, MD;  Location: [facility];  Service: Endoscopy;  Laterality: N/A;  . ESOPHAGOGASTRODUODENOSCOPY N/A 05/28/2013   Procedure: ESOPHAGOGASTRODUODENOSCOPY (EGD);  Surgeon: Cleotis Nipper, MD;  Location: [facility];  Service: Endoscopy;  Laterality: N/A;  . ESOPHAGOGASTRODUODENOSCOPY N/A 02/20/2015   Procedure: ESOPHAGOGASTRODUODENOSCOPY (EGD);  Surgeon: Arta Silence, MD;  Location[facility];  Service: Endoscopy;  Laterality: N/A;  . ESOPHAGOGASTRODUODENOSCOPY (EGD) WITH PROPOFOL N/A 06/16/2013   Procedure: ESOPHAGOGASTRODUODENOSCOPY (EGD) WITH PROPOFOL;  Surgeon: Arta Silence, MD;  [facility];  Service: Endoscopy;  Laterality: N/A;  . ESOPHAGOGASTRODUODENOSCOPY (EGD) WITH PROPOFOL N/A 11/16/2014   Procedure: ESOPHAGOGASTRODUODENOSCOPY (EGD) WITH PROPOFOL;  Surgeon: Arta Silence, MD;  [facility];  Service: Endoscopy;  Laterality: N/A;  . ESOPHAGOGASTRODUODENOSCOPY (EGD) WITH PROPOFOL N/A 02/13/2017   Procedure: ESOPHAGOGASTRODUODENOSCOPY (EGD) WITH PROPOFOL;  Surgeon: Arta Silence, MD;  Location: [place];  Service: Endoscopy;  Laterality: N/A;  . EYE SURGERY     cataract sx  . HERNIA REPAIR     x 2  . TOTAL HIP ARTHROPLASTY     bilateral    There were no vitals filed for this visit.  Subjective Assessment - 02/28/20 1103    Subjective  No falls, wife reports walking a little better at home  [place] Adult PT Treatment/Exercise - 02/28/20 0001      High Level Balance   High Level Balance Activities  Side stepping;Backward walking;Marching forwards    High Level Balance Comments  ball toss on solid surface, 6" toe touches with SPC while on airex, on airex reaching      Knee/Hip Exercises: Aerobic   Recumbent Bike  x 7 minutes    Nustep  level 5 x 7  minutes      Knee/Hip Exercises: Machines for Strengthening   Cybex Knee Extension  5# 3x10    Cybex Knee Flexion  20# 3x10               PT Short Term Goals - 02/11/20 1012      PT SHORT TERM GOAL #1   Title  independent with initial HEP    Status  Partially Met        PT Long Term Goals - 02/24/20 1202      PT LONG TERM GOAL #1   Title  decrease TUG to 15 seconds    Status  On-going      PT LONG TERM GOAL #4   Title  understand fall risks at home    Status  Partially Met            Plan - 02/28/20 1153    Clinical Impression Statement  Patient a little lethargic today. he tends to get in a hurry on the exercises and with walking.  With cues he does much better.  A lot of difficulty on the dynamic surfaces    PT Next Visit Plan  assess TUG and Berg    Consulted and Agree with Plan of Care  Patient;Family member/caregiver       Patient will benefit from skilled therapeutic intervention in order to improve the following deficits and impairments:  Abnormal gait, Decreased range of motion, Difficulty walking, Decreased endurance, Decreased activity tolerance, Improper body mechanics, Impaired flexibility, Decreased balance, Decreased mobility, Decreased strength, Postural dysfunction  Visit Diagnosis: Muscle weakness (generalized)  Other abnormalities of gait and mobility  Repeated falls     Problem List Patient Active Problem List   Diagnosis Date Noted  . Obstructive sleep apnea on CPAP 10/05/2018  . Aspiration pneumonia due to gastric secretions ([place])   . Hypervolemia   . Hypokalemia 03/15/2018  . Hypomagnesemia 03/15/2018  . Bladder mass 03/15/2018  . Small bowel obstruction ([place]) 03/11/2018  . Diabetes mellitus ([place]) 03/11/2018  . GERD (gastroesophageal reflux disease) 03/11/2018  . Esophageal stricture 03/11/2018  . SBO (small bowel obstruction) ([place])   . Symptomatic sinus bradycardia   . Paroxysmal atrial fibrillation ([place]) 02/14/2017   . Essential hypertension 02/14/2017  . CAD (coronary artery disease) 07/10/2015  . Fatigue 07/10/2015  . PONV (postoperative nausea and vomiting) 08/09/2014  . Brow ptosis 06/08/2013  . Dermatochalasis 09/23/2012  . Diabetes mellitus type 2, diet-controlled ([employer]) 09/23/2012  . After-cataract 12/26/2011  . Bilateral dry eyes 12/26/2011  . Blepharitis 10/03/2011  . Status post corneal transplant 10/03/2011  . Pseudophakia, both eyes 08/09/2006    Sumner Boast., PT 02/28/2020, 11:55 AM  [place] [address] [place], [place], [address] Phone: [phone]   Fax:  [phone]  Name: Jonathan [name] MRN: [id number] Date of Birth: 1931-05-29

## 2020-03-02 ENCOUNTER — Encounter: Payer: Self-pay | Admitting: Physical Therapy

## 2020-03-02 ENCOUNTER — Ambulatory Visit: Payer: Medicare Other | Admitting: Physical Therapy

## 2020-03-02 ENCOUNTER — Other Ambulatory Visit: Payer: Self-pay

## 2020-03-02 DIAGNOSIS — R2689 Other abnormalities of gait and mobility: Secondary | ICD-10-CM

## 2020-03-02 DIAGNOSIS — R296 Repeated falls: Secondary | ICD-10-CM | POA: Diagnosis not present

## 2020-03-02 DIAGNOSIS — M6281 Muscle weakness (generalized): Secondary | ICD-10-CM

## 2020-03-02 NOTE — Therapy (Signed)
[place] [place], [place], [address] Phone: [phone]   Fax:  [phone]  Physical Therapy Treatment  Patient Details  Name: Jonathan [name] MRN: [id number] Date of Birth: [birth year] Referring Provider (PT): Josetta Huddle   Encounter Date: 03/02/2020  PT End of Session - 03/02/20 1102    Visit Number  9    Date for PT Re-Evaluation  04/03/20    PT Start Time  6433    PT Stop Time  1100    PT Time Calculation (min)  45 min    Activity Tolerance  Patient tolerated treatment well    Behavior During Therapy  [facility] for tasks assessed/performed       Past Medical History:  Diagnosis Date  . Acne rosacea   . Arthritis   . Atrial fibrillation ([place])   . Borderline diabetes   . CAD (coronary artery disease)   . Diabetes mellitus without complication ([place])   . Diverticulosis   . Elevated PSA   . Esophageal stricture   . GERD (gastroesophageal reflux disease)   . History of nuclear stress test    Nuclear stress test 6/18: EF 59, small apical defect (?old MI); no ischemia; Low Risk  . Hypertension   . Myocardial infarction ([place])   . Neck fracture ([place]) 2015  . PONV (postoperative nausea and vomiting) 08/09/2014  . Shoulder pain     Past Surgical History:  Procedure Laterality Date  . BALLOON DILATION  04/02/2012   Procedure: BALLOON DILATION;  Surgeon: Arta Silence, MD;  [facility];  Service: Endoscopy;  Laterality: N/A;  . BALLOON DILATION N/A 06/16/2013   Procedure: BALLOON DILATION;  Surgeon: Arta Silence, MD;  [facility];  Service: Endoscopy;  Laterality: N/A;  . BALLOON DILATION N/A 11/16/2014   Procedure: BALLOON DILATION;  Surgeon: Arta Silence, MD;  [facility];  Service: Endoscopy;  Laterality: N/A;  . BALLOON DILATION N/A 02/13/2017   Procedure: BALLOON DILATION;  Surgeon: Arta Silence, MD;  Location: [facility] ENDOSCOPY;  Service: Endoscopy;  Laterality: N/A;   . CORNEAL TRANSPLANT    . ESOPHAGOGASTRODUODENOSCOPY  04/02/2012   Procedure: ESOPHAGOGASTRODUODENOSCOPY (EGD);  Surgeon: Arta Silence, MD;  Location[facility];  Service: Endoscopy;  Laterality: N/A;  . ESOPHAGOGASTRODUODENOSCOPY N/A 05/28/2013   Procedure: ESOPHAGOGASTRODUODENOSCOPY (EGD);  Surgeon: Cleotis Nipper, MD;  Location: [facility];  Service: Endoscopy;  Laterality: N/A;  . ESOPHAGOGASTRODUODENOSCOPY N/A 02/20/2015   Procedure: ESOPHAGOGASTRODUODENOSCOPY (EGD);  Surgeon: Arta Silence, MD;  Location: [facility];  Service: Endoscopy;  Laterality: N/A;  . ESOPHAGOGASTRODUODENOSCOPY (EGD) WITH PROPOFOL N/A 06/16/2013   Procedure: ESOPHAGOGASTRODUODENOSCOPY (EGD) WITH PROPOFOL;  Surgeon: Arta Silence, MD;  [facility];  Service: Endoscopy;  Laterality: N/A;  . ESOPHAGOGASTRODUODENOSCOPY (EGD) WITH PROPOFOL N/A 11/16/2014   Procedure: ESOPHAGOGASTRODUODENOSCOPY (EGD) WITH PROPOFOL;  Surgeon: Arta Silence, MD;  [facility];  Service: Endoscopy;  Laterality: N/A;  . ESOPHAGOGASTRODUODENOSCOPY (EGD) WITH PROPOFOL N/A 02/13/2017   Procedure: ESOPHAGOGASTRODUODENOSCOPY (EGD) WITH PROPOFOL;  Surgeon: Arta Silence, MD;  Location: [facility];  Service: Endoscopy;  Laterality: N/A;  . EYE SURGERY     cataract sx  . HERNIA REPAIR     x 2  . TOTAL HIP ARTHROPLASTY     bilateral    There were no vitals filed for this visit.  Subjective Assessment - 03/02/20 1016    Subjective  Pt reports he is feeling a little more steady overall    Currently in Pain?  No/denies                        [facility] Adult PT Treatment/Exercise - 03/02/20 0001      Ambulation/Gait   Gait Comments  gait outside with [facility]; pt carrying cane. Pt required [facility] assist for curbs and unlevel surfaces. Increased speed and shuffling steps down inclines. Pt gait with RW around clinic; much more stable and faster gait speed with RW      Knee/Hip Exercises: Aerobic   Nustep   level 5 x 7 minutes      Knee/Hip Exercises: Machines for Strengthening   Cybex Knee Extension  5# 3x10    Cybex Knee Flexion  20# 3x10    Cybex Leg Press  20# 3x10      Knee/Hip Exercises: Seated   Sit to Sand  20 reps;without UE support      Knee/Hip Exercises: Supine   Other Supine Knee/Hip Exercises  toe taps alternating to 6" box               PT Short Term Goals - 02/11/20 1012      PT SHORT TERM GOAL #1   Title  independent with initial HEP    Status  Partially Met        PT Long Term Goals - 02/24/20 1202      PT LONG TERM GOAL #1   Title  decrease TUG to 15 seconds    Status  On-going      PT LONG TERM GOAL #4   Title  understand fall risks at home    Status  Partially Met            Plan - 03/02/20 1102    Clinical Impression Statement  Pt CGA-min A for gait with SPC; unsteady on curbs and grass; tends to begin shuffling steps and speeding up down inclines. Pt supervision assist for gait with RW; recommended to use RW when going long distances. Continued progressing LE strengthening.    PT Treatment/Interventions  ADLs/Self Care Home Management;Gait training;Neuromuscular re-education;Balance training;Therapeutic exercise;Therapeutic activities;Functional mobility training;Stair training;Patient/family education;Manual techniques    PT Next Visit Plan  Progress LE strengthening, balance/gait training ex's    Consulted and Agree with Plan of Care  Patient;Family member/caregiver       Patient will benefit from skilled therapeutic intervention in order to improve the following deficits and impairments:  Abnormal gait, Decreased range of motion, Difficulty walking, Decreased endurance, Decreased activity tolerance, Improper body mechanics, Impaired flexibility, Decreased balance, Decreased mobility, Decreased strength, Postural dysfunction  Visit Diagnosis: Muscle weakness (generalized)  Other abnormalities of gait and mobility  Repeated  falls     Problem List Patient Active Problem List   Diagnosis Date Noted  . Obstructive sleep apnea on CPAP 10/05/2018  . Aspiration pneumonia due to gastric secretions ([place])   . Hypervolemia   . Hypokalemia 03/15/2018  . Hypomagnesemia 03/15/2018  . Bladder mass 03/15/2018  . Small bowel obstruction ([place]) 03/11/2018  . Diabetes mellitus ([place]) 03/11/2018  . GERD (gastroesophageal reflux disease) 03/11/2018  . Esophageal stricture 03/11/2018  . SBO (small bowel obstruction) ([place])   . Symptomatic sinus bradycardia   . Paroxysmal atrial fibrillation ([place]) 02/14/2017  . Essential hypertension 02/14/2017  . CAD (coronary artery disease) 07/10/2015  . Fatigue 07/10/2015  . PONV (postoperative nausea and vomiting) 08/09/2014  . Brow ptosis 06/08/2013  . Dermatochalasis 09/23/2012  . Diabetes mellitus type 2, diet-controlled ([place]) 09/23/2012  .  After-cataract 12/26/2011  . Bilateral dry eyes 12/26/2011  . Blepharitis 10/03/2011  . Status post corneal transplant 10/03/2011  . Pseudophakia, both eyes 08/09/2006   Amador Cunas, PT, DPT Donald Prose Temia Debroux 03/02/2020, 11:07 AM  [address] [place], [place], [address] Phone: [phone]   Fax:  [phone]  Name: Jonathan [name] MRN: [id number] Date of Birth: Jul 05, 1931

## 2020-03-06 ENCOUNTER — Ambulatory Visit: Payer: Medicare Other | Admitting: Physical Therapy

## 2020-03-06 ENCOUNTER — Other Ambulatory Visit: Payer: Self-pay

## 2020-03-06 ENCOUNTER — Encounter: Payer: Self-pay | Admitting: Physical Therapy

## 2020-03-06 DIAGNOSIS — R296 Repeated falls: Secondary | ICD-10-CM | POA: Diagnosis not present

## 2020-03-06 DIAGNOSIS — R2689 Other abnormalities of gait and mobility: Secondary | ICD-10-CM

## 2020-03-06 DIAGNOSIS — M6281 Muscle weakness (generalized): Secondary | ICD-10-CM

## 2020-03-06 NOTE — Therapy (Signed)
[place] [address] [address] [place], [place], [address] Phone: [phone]   Fax:  [phone] Progress Note Reporting Period 02/02/20 to 03/06/20 for the first 10 visits  See note below for Objective Data and Assessment of Progress/Goals.      Physical Therapy Treatment  Patient Details  Name: Jonathan [name] MRN: [id number] Date of Birth: 1931-10-03 Referring Provider (PT): Josetta Huddle   Encounter Date: 03/06/2020  PT End of Session - 03/06/20 1349    Visit Number  10    Date for PT Re-Evaluation  04/03/20    Authorization Type  MC    PT Start Time  1100    PT Stop Time  1150    PT Time Calculation (min)  50 min    Activity Tolerance  Patient tolerated treatment well    Behavior During Therapy  [facility] for tasks assessed/performed       Past Medical History:  Diagnosis Date  . Acne rosacea   . Arthritis   . Atrial fibrillation ([place])   . Borderline diabetes   . CAD (coronary artery disease)   . Diabetes mellitus without complication ([place])   . Diverticulosis   . Elevated PSA   . Esophageal stricture   . GERD (gastroesophageal reflux disease)   . History of nuclear stress test    Nuclear stress test 6/18: EF 59, small apical defect (?old MI); no ischemia; Low Risk  . Hypertension   . Myocardial infarction ([place])   . Neck fracture ([place]) 2015  . PONV (postoperative nausea and vomiting) 08/09/2014  . Shoulder pain     Past Surgical History:  Procedure Laterality Date  . BALLOON DILATION  04/02/2012   Procedure: BALLOON DILATION;  Surgeon: Arta Silence, MD;  [facility];  Service: Endoscopy;  Laterality: N/A;  . BALLOON DILATION N/A 06/16/2013   Procedure: BALLOON DILATION;  Surgeon: Arta Silence, MD;  [facility];  Service: Endoscopy;  Laterality: N/A;  . BALLOON DILATION N/A 11/16/2014   Procedure: BALLOON DILATION;  Surgeon: Arta Silence, MD;  [facility];  Service:  Endoscopy;  Laterality: N/A;  . BALLOON DILATION N/A 02/13/2017   Procedure: BALLOON DILATION;  Surgeon: Arta Silence, MD;  Location: [facility] ENDOSCOPY;  Service: Endoscopy;  Laterality: N/A;  . CORNEAL TRANSPLANT    . ESOPHAGOGASTRODUODENOSCOPY  04/02/2012   Procedure: ESOPHAGOGASTRODUODENOSCOPY (EGD);  Surgeon: Arta Silence, MD;  Location: [facility];  Service: Endoscopy;  Laterality: N/A;  . ESOPHAGOGASTRODUODENOSCOPY N/A 05/28/2013   Procedure: ESOPHAGOGASTRODUODENOSCOPY (EGD);  Surgeon: Cleotis Nipper, MD;  Location: [facility];  Service: Endoscopy;  Laterality: N/A;  . ESOPHAGOGASTRODUODENOSCOPY N/A 02/20/2015   Procedure: ESOPHAGOGASTRODUODENOSCOPY (EGD);  Surgeon: Arta Silence, MD;  Location: [facility];  Service: Endoscopy;  Laterality: N/A;  . ESOPHAGOGASTRODUODENOSCOPY (EGD) WITH PROPOFOL N/A 06/16/2013   Procedure: ESOPHAGOGASTRODUODENOSCOPY (EGD) WITH PROPOFOL;  Surgeon: Arta Silence, MD;  [facility];  Service: Endoscopy;  Laterality: N/A;  . ESOPHAGOGASTRODUODENOSCOPY (EGD) WITH PROPOFOL N/A 11/16/2014   Procedure: ESOPHAGOGASTRODUODENOSCOPY (EGD) WITH PROPOFOL;  Surgeon: Arta Silence, MD;  [facility];  Service: Endoscopy;  Laterality: N/A;  . ESOPHAGOGASTRODUODENOSCOPY (EGD) WITH PROPOFOL N/A 02/13/2017   Procedure: ESOPHAGOGASTRODUODENOSCOPY (EGD) WITH PROPOFOL;  Surgeon: Arta Silence, MD;  Location: [facility];  Service: Endoscopy;  Laterality: N/A;  . EYE SURGERY     cataract sx  . HERNIA REPAIR     x 2  . TOTAL HIP ARTHROPLASTY     bilateral    There were  no vitals filed for this visit.  Subjective Assessment - 03/06/20 1109    Subjective  Patients wife reports that they both almost fell last week    Currently in Pain?  No/denies                        [facility] Adult PT Treatment/Exercise - 03/06/20 0001      Transfers   Comments  worked on supine to sit, he really struggles to roll and then needs assist to get up, wife  reports that this hurts her back, so I tried a few things and this will take him getting stronger and more mobilitu, but I did show that a hand rail and he can do on own, will need some increased practice before I recommend this      High Level Balance   High Level Balance Activities  Side stepping;Backward walking;Marching forwards    High Level Balance Comments  sit to stand ball toss, 6" toe touches, small step overs front to back and side to side      Knee/Hip Exercises: Aerobic   Recumbent Bike  x 7 minutes    Nustep  level 5 x 7 minutes      Knee/Hip Exercises: Seated   Other Seated Knee/Hip Exercises  bosu behind partial sit ups 2x10               PT Short Term Goals - 02/11/20 1012      PT SHORT TERM GOAL #1   Title  independent with initial HEP    Status  Partially Met        PT Long Term Goals - 02/24/20 1202      PT LONG TERM GOAL #1   Title  decrease TUG to 15 seconds    Status  On-going      PT LONG TERM GOAL #4   Title  understand fall risks at home    Status  Partially Met            Plan - 03/06/20 1349    Clinical Impression Statement  His movements and walking and balance activities seemed to be better today with less CGA or fall righting, he did struggle with bed mobility and transfers, we worked on this and will need to do this in the future due to his inability to be independent    PT Next Visit Plan  assess berg and TUG       Patient will benefit from skilled therapeutic intervention in order to improve the following deficits and impairments:  Abnormal gait, Decreased range of motion, Difficulty walking, Decreased endurance, Decreased activity tolerance, Improper body mechanics, Impaired flexibility, Decreased balance, Decreased mobility, Decreased strength, Postural dysfunction  Visit Diagnosis: Muscle weakness (generalized)  Other abnormalities of gait and mobility  Repeated falls     Problem List Patient Active Problem List    Diagnosis Date Noted  . Obstructive sleep apnea on CPAP 10/05/2018  . Aspiration pneumonia due to gastric secretions ([place])   . Hypervolemia   . Hypokalemia 03/15/2018  . Hypomagnesemia 03/15/2018  . Bladder mass 03/15/2018  . Small bowel obstruction ([place]) 03/11/2018  . Diabetes mellitus ([place]) 03/11/2018  . GERD (gastroesophageal reflux disease) 03/11/2018  . Esophageal stricture 03/11/2018  . SBO (small bowel obstruction) ([place])   . Symptomatic sinus bradycardia   . Paroxysmal atrial fibrillation ([place]) 02/14/2017  . Essential hypertension 02/14/2017  . CAD (coronary artery disease) 07/10/2015  . Fatigue 07/10/2015  .  PONV (postoperative nausea and vomiting) 08/09/2014  . Brow ptosis 06/08/2013  . Dermatochalasis 09/23/2012  . Diabetes mellitus type 2, diet-controlled ([place]) 09/23/2012  . After-cataract 12/26/2011  . Bilateral dry eyes 12/26/2011  . Blepharitis 10/03/2011  . Status post corneal transplant 10/03/2011  . Pseudophakia, both eyes 08/09/2006    Sumner Boast., PT 03/06/2020, 1:53 PM  [place] [address] [place], [place], [address] Phone: [phone]   Fax:  [phone]  Name: Parks Czajkowski MRN: [id number] Date of Birth: September 29, 1931

## 2020-03-07 DIAGNOSIS — R350 Frequency of micturition: Secondary | ICD-10-CM | POA: Diagnosis not present

## 2020-03-07 DIAGNOSIS — N400 Enlarged prostate without lower urinary tract symptoms: Secondary | ICD-10-CM | POA: Diagnosis not present

## 2020-03-07 DIAGNOSIS — F0390 Unspecified dementia without behavioral disturbance: Secondary | ICD-10-CM | POA: Diagnosis not present

## 2020-03-07 DIAGNOSIS — F05 Delirium due to known physiological condition: Secondary | ICD-10-CM | POA: Diagnosis not present

## 2020-03-07 DIAGNOSIS — G319 Degenerative disease of nervous system, unspecified: Secondary | ICD-10-CM | POA: Diagnosis not present

## 2020-03-07 DIAGNOSIS — I10 Essential (primary) hypertension: Secondary | ICD-10-CM | POA: Diagnosis not present

## 2020-03-07 DIAGNOSIS — I959 Hypotension, unspecified: Secondary | ICD-10-CM | POA: Diagnosis not present

## 2020-03-08 ENCOUNTER — Other Ambulatory Visit: Payer: Self-pay

## 2020-03-08 ENCOUNTER — Encounter: Payer: Self-pay | Admitting: Physical Therapy

## 2020-03-08 ENCOUNTER — Ambulatory Visit: Payer: Medicare Other | Admitting: Physical Therapy

## 2020-03-08 DIAGNOSIS — R296 Repeated falls: Secondary | ICD-10-CM

## 2020-03-08 DIAGNOSIS — M6281 Muscle weakness (generalized): Secondary | ICD-10-CM | POA: Diagnosis not present

## 2020-03-08 DIAGNOSIS — R2689 Other abnormalities of gait and mobility: Secondary | ICD-10-CM | POA: Diagnosis not present

## 2020-03-08 NOTE — Therapy (Signed)
[place], [place], [address] Phone: [phone]   Fax:  [phone]  Physical Therapy Treatment  Patient Details  Name: Jonathan [name] MRN: [id number] Date of Birth: [birth year] Referring Provider (PT): Josetta Huddle   Encounter Date: 03/08/2020  PT End of Session - 03/08/20 1147    Visit Number  11    Date for PT Re-Evaluation  04/03/20    Authorization Type  MC    PT Start Time  1057    PT Stop Time  1145    PT Time Calculation (min)  48 min    Activity Tolerance  Patient tolerated treatment well    Behavior During Therapy  [facility] for tasks assessed/performed       Past Medical History:  Diagnosis Date  . Acne rosacea   . Arthritis   . Atrial fibrillation ([place])   . Borderline diabetes   . CAD (coronary artery disease)   . Diabetes mellitus without complication ([place])   . Diverticulosis   . Elevated PSA   . Esophageal stricture   . GERD (gastroesophageal reflux disease)   . History of nuclear stress test    Nuclear stress test 6/18: EF 59, small apical defect (?old MI); no ischemia; Low Risk  . Hypertension   . Myocardial infarction ([place])   . Neck fracture ([place]) 2015  . PONV (postoperative nausea and vomiting) 08/09/2014  . Shoulder pain     Past Surgical History:  Procedure Laterality Date  . BALLOON DILATION  04/02/2012   Procedure: BALLOON DILATION;  Surgeon: Arta Silence, MD;  [facility];  Service: Endoscopy;  Laterality: N/A;  . BALLOON DILATION N/A 06/16/2013   Procedure: BALLOON DILATION;  Surgeon: Arta Silence, MD;  [facility];  Service: Endoscopy;  Laterality: N/A;  . BALLOON DILATION N/A 11/16/2014   Procedure: BALLOON DILATION;  Surgeon: Arta Silence, MD;  [facility];  Service: Endoscopy;  Laterality: N/A;  . BALLOON DILATION N/A 02/13/2017   Procedure: BALLOON DILATION;  Surgeon: Arta Silence, MD;  Location: [facility] ENDOSCOPY;  Service:  Endoscopy;  Laterality: N/A;  . CORNEAL TRANSPLANT    . ESOPHAGOGASTRODUODENOSCOPY  04/02/2012   Procedure: ESOPHAGOGASTRODUODENOSCOPY (EGD);  Surgeon: Arta Silence, MD;  [facility];  Service: Endoscopy;  Laterality: N/A;  . ESOPHAGOGASTRODUODENOSCOPY N/A 05/28/2013   Procedure: ESOPHAGOGASTRODUODENOSCOPY (EGD);  Surgeon: Cleotis Nipper, MD;  [facility];  Service: Endoscopy;  Laterality: N/A;  . ESOPHAGOGASTRODUODENOSCOPY N/A 02/20/2015   Procedure: ESOPHAGOGASTRODUODENOSCOPY (EGD);  Surgeon: Arta Silence, MD;  Location[facility];  Service: Endoscopy;  Laterality: N/A;  . ESOPHAGOGASTRODUODENOSCOPY (EGD) WITH PROPOFOL N/A 06/16/2013   Procedure: ESOPHAGOGASTRODUODENOSCOPY (EGD) WITH PROPOFOL;  Surgeon: Arta Silence, MD;  [facility];  Service: Endoscopy;  Laterality: N/A;  . ESOPHAGOGASTRODUODENOSCOPY (EGD) WITH PROPOFOL N/A 11/16/2014   Procedure: ESOPHAGOGASTRODUODENOSCOPY (EGD) WITH PROPOFOL;  Surgeon: Arta Silence, MD;  [facility];  Service: Endoscopy;  Laterality: N/A;  . ESOPHAGOGASTRODUODENOSCOPY (EGD) WITH PROPOFOL N/A 02/13/2017   Procedure: ESOPHAGOGASTRODUODENOSCOPY (EGD) WITH PROPOFOL;  Surgeon: Arta Silence, MD;  Location: [facility];  Service: Endoscopy;  Laterality: N/A;  . EYE SURGERY     cataract sx  . HERNIA REPAIR     x 2  . TOTAL HIP ARTHROPLASTY     bilateral    There were no vitals filed for this visit.  Subjective Assessment - 03/08/20 1118    Subjective  Patient reports that he went to the MD yesterday, reports  his BP was very low, I took it prior to treatment and it was 140/72, wife reports that is his normal    Currently in Pain?  No/denies         [facility] PT Assessment - 03/08/20 0001      Berg Balance Test   Sit to Stand  Able to stand without using hands and stabilize independently    Standing Unsupported  Able to stand safely 2 minutes    Sitting with Back Unsupported but Feet Supported on Floor or  Stool  Able to sit safely and securely 2 minutes    Stand to Sit  Sits safely with minimal use of hands    Transfers  Able to transfer safely, definite need of hands    Standing Unsupported with Eyes Closed  Able to stand 10 seconds safely    Standing Unsupported with Feet Together  Able to place feet together independently and stand 1 minute safely    From Standing, Reach Forward with Outstretched Arm  Can reach forward >12 cm safely (5")    From Standing Position, Pick up Object from Floor  Unable to pick up shoe, but reaches 2-5 cm (1-2") from shoe and balances independently    From Standing Position, Turn to Look Behind Over each Shoulder  Turn sideways only but maintains balance    Turn 360 Degrees  Able to turn 360 degrees safely but slowly    Standing Unsupported, Alternately Place Feet on Step/Stool  Able to complete >2 steps/needs minimal assist    Standing Unsupported, One Foot in Front  Needs help to step but can hold 15 seconds    Standing on One Leg  Tries to lift leg/unable to hold 3 seconds but remains standing independently    Total Score  39      Timed Up and Go Test   Normal TUG (seconds)  15                    [facility] Adult PT Treatment/Exercise - 03/08/20 0001      Ambulation/Gait   Gait Comments  gait with SPC outside some cues for step length and arm swing      High Level Balance   High Level Balance Activities  Direction changes;Sudden stops    High Level Balance Comments  sit to stand ball toss, 6" toe touches, small step overs front to back and side to side      Knee/Hip Exercises: Aerobic   Recumbent Bike  x 7 minutes    Nustep  level 5 x 7 minutes               PT Short Term Goals - 02/11/20 1012      PT SHORT TERM GOAL #1   Title  independent with initial HEP    Status  Partially Met        PT Long Term Goals - 03/08/20 1149      PT LONG TERM GOAL #1   Title  decrease TUG to 15 seconds    Status  Partially Met      PT LONG  TERM GOAL #2   Title  increase Berg to 42/56    Status  Partially Met            Plan - 03/08/20 1148    Clinical Impression Statement  wife reports that at the MD office yesterday he had issues with low blood pressure, I checked it prior to treatment 140/72,  and after treatment 133/72.  HE is showing improvements in TUG and BERG.    PT Next Visit Plan  continue to work on function and balance    Consulted and Agree with Plan of Care  Patient;Family member/caregiver       Patient will benefit from skilled therapeutic intervention in order to improve the following deficits and impairments:  Abnormal gait, Decreased range of motion, Difficulty walking, Decreased endurance, Decreased activity tolerance, Improper body mechanics, Impaired flexibility, Decreased balance, Decreased mobility, Decreased strength, Postural dysfunction  Visit Diagnosis: Muscle weakness (generalized)  Other abnormalities of gait and mobility  Repeated falls     Problem List Patient Active Problem List   Diagnosis Date Noted  . Obstructive sleep apnea on CPAP 10/05/2018  . Aspiration pneumonia due to gastric secretions ([place])   . Hypervolemia   . Hypokalemia 03/15/2018  . Hypomagnesemia 03/15/2018  . Bladder mass 03/15/2018  . Small bowel obstruction ([place]) 03/11/2018  . Diabetes mellitus ([place]) 03/11/2018  . GERD (gastroesophageal reflux disease) 03/11/2018  . Esophageal stricture 03/11/2018  . SBO (small bowel obstruction) ([place])   . Symptomatic sinus bradycardia   . Paroxysmal atrial fibrillation ([place]) 02/14/2017  . Essential hypertension 02/14/2017  . CAD (coronary artery disease) 07/10/2015  . Fatigue 07/10/2015  . PONV (postoperative nausea and vomiting) 08/09/2014  . Brow ptosis 06/08/2013  . Dermatochalasis 09/23/2012  . Diabetes mellitus type 2, diet-controlled ([place]) 09/23/2012  . After-cataract 12/26/2011  . Bilateral dry eyes 12/26/2011  . Blepharitis 10/03/2011  . Status post  corneal transplant 10/03/2011  . Pseudophakia, both eyes 08/09/2006    Sumner Boast., PT 03/08/2020, 11:50 AM  [address] [place], [place], [address] Phone: [phone]   Fax:  [phone]  Name: Jonathan [name] MRN: [id number] Date of Birth: 12-Oct-1931

## 2020-03-14 ENCOUNTER — Other Ambulatory Visit: Payer: Self-pay

## 2020-03-14 ENCOUNTER — Encounter: Payer: Self-pay | Admitting: Physical Therapy

## 2020-03-14 ENCOUNTER — Ambulatory Visit: Payer: Medicare Other | Attending: Internal Medicine | Admitting: Physical Therapy

## 2020-03-14 DIAGNOSIS — M6281 Muscle weakness (generalized): Secondary | ICD-10-CM | POA: Insufficient documentation

## 2020-03-14 DIAGNOSIS — R296 Repeated falls: Secondary | ICD-10-CM

## 2020-03-14 DIAGNOSIS — R2689 Other abnormalities of gait and mobility: Secondary | ICD-10-CM | POA: Diagnosis present

## 2020-03-14 NOTE — Therapy (Signed)
[place], [place], [address] Phone: [phone]   Fax:  [phone]  Physical Therapy Treatment  Patient Details  Name: Jonathan [name] MRN: [id number] Date of Birth: 1931/03/12 Referring Provider (PT): Josetta Huddle   Encounter Date: 03/14/2020  PT End of Session - 03/14/20 1315    Visit Number  12    Date for PT Re-Evaluation  04/03/20    Authorization Type  MC    PT Start Time  1058    PT Stop Time  1146    PT Time Calculation (min)  48 min    Activity Tolerance  Patient tolerated treatment well    Behavior During Therapy  [facility] for tasks assessed/performed       Past Medical History:  Diagnosis Date  . Acne rosacea   . Arthritis   . Atrial fibrillation ([place])   . Borderline diabetes   . CAD (coronary artery disease)   . Diabetes mellitus without complication ([place])   . Diverticulosis   . Elevated PSA   . Esophageal stricture   . GERD (gastroesophageal reflux disease)   . History of nuclear stress test    Nuclear stress test 6/18: EF 59, small apical defect (?old MI); no ischemia; Low Risk  . Hypertension   . Myocardial infarction ([place])   . Neck fracture ([place]) 2015  . PONV (postoperative nausea and vomiting) 08/09/2014  . Shoulder pain     Past Surgical History:  Procedure Laterality Date  . BALLOON DILATION  04/02/2012   Procedure: BALLOON DILATION;  Surgeon: Arta Silence, MD;  [facility];  Service: Endoscopy;  Laterality: N/A;  . BALLOON DILATION N/A 06/16/2013   Procedure: BALLOON DILATION;  Surgeon: Arta Silence, MD;  [facility];  Service: Endoscopy;  Laterality: N/A;  . BALLOON DILATION N/A 11/16/2014   Procedure: BALLOON DILATION;  Surgeon: Arta Silence, MD;  [facility];  Service: Endoscopy;  Laterality: N/A;  . BALLOON DILATION N/A 02/13/2017   Procedure: BALLOON DILATION;  Surgeon: Arta Silence, MD;  Location: [facility] ENDOSCOPY;  Service:  Endoscopy;  Laterality: N/A;  . CORNEAL TRANSPLANT    . ESOPHAGOGASTRODUODENOSCOPY  04/02/2012   Procedure: ESOPHAGOGASTRODUODENOSCOPY (EGD);  Surgeon: Arta Silence, MD;  Location: [facility];  Service: Endoscopy;  Laterality: N/A;  . ESOPHAGOGASTRODUODENOSCOPY N/A 05/28/2013   Procedure: ESOPHAGOGASTRODUODENOSCOPY (EGD);  Surgeon: Cleotis Nipper, MD;  Location: [facility];  Service: Endoscopy;  Laterality: N/A;  . ESOPHAGOGASTRODUODENOSCOPY N/A 02/20/2015   Procedure: ESOPHAGOGASTRODUODENOSCOPY (EGD);  Surgeon: Arta Silence, MD;  Location: [facility];  Service: Endoscopy;  Laterality: N/A;  . ESOPHAGOGASTRODUODENOSCOPY (EGD) WITH PROPOFOL N/A 06/16/2013   Procedure: ESOPHAGOGASTRODUODENOSCOPY (EGD) WITH PROPOFOL;  Surgeon: Arta Silence, MD;  [facility];  Service: Endoscopy;  Laterality: N/A;  . ESOPHAGOGASTRODUODENOSCOPY (EGD) WITH PROPOFOL N/A 11/16/2014   Procedure: ESOPHAGOGASTRODUODENOSCOPY (EGD) WITH PROPOFOL;  Surgeon: Arta Silence, MD;  [facility];  Service: Endoscopy;  Laterality: N/A;  . ESOPHAGOGASTRODUODENOSCOPY (EGD) WITH PROPOFOL N/A 02/13/2017   Procedure: ESOPHAGOGASTRODUODENOSCOPY (EGD) WITH PROPOFOL;  Surgeon: Arta Silence, MD;  Location: [place];  Service: Endoscopy;  Laterality: N/A;  . EYE SURGERY     cataract sx  . HERNIA REPAIR     x 2  . TOTAL HIP ARTHROPLASTY     bilateral    There were no vitals filed for this visit.  Subjective Assessment - 03/14/20 1109    Subjective  Patient reports that he did have a fall going in  the door at home, he reports that he had an arm full of groceries and his shoe string came untied, denies any injury                        [facility] Adult PT Treatment/Exercise - 03/14/20 0001      Ambulation/Gait   Gait Comments  gait downt he stairs and then outside and up the slope to his car      High Level Balance   High Level Balance Activities  Direction changes;Sudden stops    High  Level Balance Comments  sit to stand ball toss, 6" toe touches, small step overs front to back and side to side      Knee/Hip Exercises: Aerobic   Recumbent Bike  x 7 minutes    Nustep  level 5 x 7 minutes      Knee/Hip Exercises: Machines for Strengthening   Cybex Knee Extension  5# 3x10    Cybex Knee Flexion  20# 3x10      Knee/Hip Exercises: Seated   Other Seated Knee/Hip Exercises  bosu behind partial sit ups 2x10               PT Short Term Goals - 02/11/20 1012      PT SHORT TERM GOAL #1   Title  independent with initial HEP    Status  Partially Met        PT Long Term Goals - 03/08/20 1149      PT LONG TERM GOAL #1   Title  decrease TUG to 15 seconds    Status  Partially Met      PT LONG TERM GOAL #2   Title  increase Berg to 42/56    Status  Partially Met            Plan - 03/14/20 1315    Clinical Impression Statement  Patient had a fall this weekend, reports carrying groceries and trying to get in the house, also reports shoe string was untied.  he denies injury, here he does well but today at two times where he lost balance, he does correct    PT Next Visit Plan  continue to work on function and balance    Consulted and Agree with Plan of Care  Patient       Patient will benefit from skilled therapeutic intervention in order to improve the following deficits and impairments:  Abnormal gait, Decreased range of motion, Difficulty walking, Decreased endurance, Decreased activity tolerance, Improper body mechanics, Impaired flexibility, Decreased balance, Decreased mobility, Decreased strength, Postural dysfunction  Visit Diagnosis: Muscle weakness (generalized)  Other abnormalities of gait and mobility  Repeated falls     Problem List Patient Active Problem List   Diagnosis Date Noted  . Obstructive sleep apnea on CPAP 10/05/2018  . Aspiration pneumonia due to gastric secretions ([place])   . Hypervolemia   . Hypokalemia 03/15/2018  .  Hypomagnesemia 03/15/2018  . Bladder mass 03/15/2018  . Small bowel obstruction ([place]) 03/11/2018  . Diabetes mellitus ([place]) 03/11/2018  . GERD (gastroesophageal reflux disease) 03/11/2018  . Esophageal stricture 03/11/2018  . SBO (small bowel obstruction) ([place])   . Symptomatic sinus bradycardia   . Paroxysmal atrial fibrillation ([place]) 02/14/2017  . Essential hypertension 02/14/2017  . CAD (coronary artery disease) 07/10/2015  . Fatigue 07/10/2015  . PONV (postoperative nausea and vomiting) 08/09/2014  . Brow ptosis 06/08/2013  . Dermatochalasis 09/23/2012  . Diabetes mellitus type 2,  diet-controlled ([place]) 09/23/2012  . After-cataract 12/26/2011  . Bilateral dry eyes 12/26/2011  . Blepharitis 10/03/2011  . Status post corneal transplant 10/03/2011  . Pseudophakia, both eyes 08/09/2006    Sumner Boast., PT 03/14/2020, 1:44 PM  [address] [place], [place], [address] Phone: [phone]   Fax:  [phone]  Name: Jonathan [name] MRN: [id number] Date of Birth: 25-Nov-1930

## 2020-03-16 ENCOUNTER — Other Ambulatory Visit: Payer: Self-pay

## 2020-03-16 ENCOUNTER — Ambulatory Visit: Payer: Medicare Other | Admitting: Physical Therapy

## 2020-03-16 ENCOUNTER — Encounter: Payer: Self-pay | Admitting: Physical Therapy

## 2020-03-16 DIAGNOSIS — R296 Repeated falls: Secondary | ICD-10-CM

## 2020-03-16 DIAGNOSIS — M6281 Muscle weakness (generalized): Secondary | ICD-10-CM

## 2020-03-16 DIAGNOSIS — R2689 Other abnormalities of gait and mobility: Secondary | ICD-10-CM

## 2020-03-16 NOTE — Therapy (Signed)
[place], [place], [address] Phone: [phone]   Fax:  [phone]  Physical Therapy Treatment  Patient Details  Name: Jonathan [name] MRN: [id number] Date of Birth: 21-Feb-1931 Referring Provider (PT): Josetta Huddle   Encounter Date: 03/16/2020  PT End of Session - 03/16/20 1407    Visit Number  13    Date for PT Re-Evaluation  04/03/20    Authorization Type  MC    PT Start Time  1315    PT Stop Time  1400    PT Time Calculation (min)  45 min    Activity Tolerance  Patient tolerated treatment well    Behavior During Therapy  [facility] for tasks assessed/performed       Past Medical History:  Diagnosis Date  . Acne rosacea   . Arthritis   . Atrial fibrillation ([place])   . Borderline diabetes   . CAD (coronary artery disease)   . Diabetes mellitus without complication ([place])   . Diverticulosis   . Elevated PSA   . Esophageal stricture   . GERD (gastroesophageal reflux disease)   . History of nuclear stress test    Nuclear stress test 6/18: EF 59, small apical defect (?old MI); no ischemia; Low Risk  . Hypertension   . Myocardial infarction ([place])   . Neck fracture ([place]) 2015  . PONV (postoperative nausea and vomiting) 08/09/2014  . Shoulder pain     Past Surgical History:  Procedure Laterality Date  . BALLOON DILATION  04/02/2012   Procedure: BALLOON DILATION;  Surgeon: Arta Silence, MD;  [facility];  Service: Endoscopy;  Laterality: N/A;  . BALLOON DILATION N/A 06/16/2013   Procedure: BALLOON DILATION;  Surgeon: Arta Silence, MD;  [facility];  Service: Endoscopy;  Laterality: N/A;  . BALLOON DILATION N/A 11/16/2014   Procedure: BALLOON DILATION;  Surgeon: Arta Silence, MD;  [facility];  Service: Endoscopy;  Laterality: N/A;  . BALLOON DILATION N/A 02/13/2017   Procedure: BALLOON DILATION;  Surgeon: Arta Silence, MD;  Location[facility] ENDOSCOPY;  Service:  Endoscopy;  Laterality: N/A;  . CORNEAL TRANSPLANT    . ESOPHAGOGASTRODUODENOSCOPY  04/02/2012   Procedure: ESOPHAGOGASTRODUODENOSCOPY (EGD);  Surgeon: Arta Silence, MD;  Location: [facility];  Service: Endoscopy;  Laterality: N/A;  . ESOPHAGOGASTRODUODENOSCOPY N/A 05/28/2013   Procedure: ESOPHAGOGASTRODUODENOSCOPY (EGD);  Surgeon: Cleotis Nipper, MD;  Location: [facility];  Service: Endoscopy;  Laterality: N/A;  . ESOPHAGOGASTRODUODENOSCOPY N/A 02/20/2015   Procedure: ESOPHAGOGASTRODUODENOSCOPY (EGD);  Surgeon: Arta Silence, MD;  Location: [facility];  Service: Endoscopy;  Laterality: N/A;  . ESOPHAGOGASTRODUODENOSCOPY (EGD) WITH PROPOFOL N/A 06/16/2013   Procedure: ESOPHAGOGASTRODUODENOSCOPY (EGD) WITH PROPOFOL;  Surgeon: Arta Silence, MD;  [facility];  Service: Endoscopy;  Laterality: N/A;  . ESOPHAGOGASTRODUODENOSCOPY (EGD) WITH PROPOFOL N/A 11/16/2014   Procedure: ESOPHAGOGASTRODUODENOSCOPY (EGD) WITH PROPOFOL;  Surgeon: Arta Silence, MD;  [facility];  Service: Endoscopy;  Laterality: N/A;  . ESOPHAGOGASTRODUODENOSCOPY (EGD) WITH PROPOFOL N/A 02/13/2017   Procedure: ESOPHAGOGASTRODUODENOSCOPY (EGD) WITH PROPOFOL;  Surgeon: Arta Silence, MD;  Location: [facility];  Service: Endoscopy;  Laterality: N/A;  . EYE SURGERY     cataract sx  . HERNIA REPAIR     x 2  . TOTAL HIP ARTHROPLASTY     bilateral    There were no vitals filed for this visit.  Subjective Assessment - 03/16/20 1324    Subjective  Patient reports that he has done well since last treatment,  no falls         [facility] PT Assessment - 03/16/20 0001      Timed Up and Go Test   Normal TUG (seconds)  14                    [facility] Adult PT Treatment/Exercise - 03/16/20 0001      High Level Balance   High Level Balance Activities  Direction changes;Sudden stops    High Level Balance Comments  sit to stand ball toss, 6" toe touches, small step overs front to back and side to  side, tandem stance      Knee/Hip Exercises: Aerobic   Recumbent Bike  x 7 minutes    Nustep  level 5 x 7 minutes    Other Aerobic  UBE level 5 x 4 minutes      Knee/Hip Exercises: Seated   Other Seated Knee/Hip Exercises  bosu behind partial sit ups 2x10               PT Short Term Goals - 02/11/20 1012      PT SHORT TERM GOAL #1   Title  independent with initial HEP    Status  Partially Met        PT Long Term Goals - 03/16/20 1408      PT LONG TERM GOAL #1   Title  decrease TUG to 15 seconds    Status  Achieved            Plan - 03/16/20 1407    Clinical Impression Statement  Patient did well with today's treatment, he reports a few stumbles, had one issue with stepping over the obstacles had a cross step and had to have CGA to right    PT Next Visit Plan  continue to work on function and balance    Consulted and Agree with Plan of Care  Patient       Patient will benefit from skilled therapeutic intervention in order to improve the following deficits and impairments:  Abnormal gait, Decreased range of motion, Difficulty walking, Decreased endurance, Decreased activity tolerance, Improper body mechanics, Impaired flexibility, Decreased balance, Decreased mobility, Decreased strength, Postural dysfunction  Visit Diagnosis: Muscle weakness (generalized)  Other abnormalities of gait and mobility  Repeated falls     Problem List Patient Active Problem List   Diagnosis Date Noted  . Obstructive sleep apnea on CPAP 10/05/2018  . Aspiration pneumonia due to gastric secretions ([place])   . Hypervolemia   . Hypokalemia 03/15/2018  . Hypomagnesemia 03/15/2018  . Bladder mass 03/15/2018  . Small bowel obstruction ([place]) 03/11/2018  . Diabetes mellitus ([place]) 03/11/2018  . GERD (gastroesophageal reflux disease) 03/11/2018  . Esophageal stricture 03/11/2018  . SBO (small bowel obstruction) ([place])   . Symptomatic sinus bradycardia   . Paroxysmal atrial  fibrillation ([place]) 02/14/2017  . Essential hypertension 02/14/2017  . CAD (coronary artery disease) 07/10/2015  . Fatigue 07/10/2015  . PONV (postoperative nausea and vomiting) 08/09/2014  . Brow ptosis 06/08/2013  . Dermatochalasis 09/23/2012  . Diabetes mellitus type 2, diet-controlled ([place]) 09/23/2012  . After-cataract 12/26/2011  . Bilateral dry eyes 12/26/2011  . Blepharitis 10/03/2011  . Status post corneal transplant 10/03/2011  . Pseudophakia, both eyes 08/09/2006    Sumner Boast., PT 03/16/2020, 2:09 PM  [place] [address] [place], [place], [address] Phone: [phone]   Fax:  [phone]  Name: Jonathan [name] MRN: [id number]  Date of Birth: July 24, 1931

## 2020-03-20 ENCOUNTER — Encounter: Payer: Self-pay | Admitting: Physical Therapy

## 2020-03-20 ENCOUNTER — Telehealth: Payer: Self-pay | Admitting: *Deleted

## 2020-03-20 ENCOUNTER — Other Ambulatory Visit: Payer: Self-pay

## 2020-03-20 ENCOUNTER — Ambulatory Visit: Payer: Medicare Other | Admitting: Physical Therapy

## 2020-03-20 DIAGNOSIS — M6281 Muscle weakness (generalized): Secondary | ICD-10-CM | POA: Diagnosis not present

## 2020-03-20 DIAGNOSIS — R2689 Other abnormalities of gait and mobility: Secondary | ICD-10-CM

## 2020-03-20 DIAGNOSIS — R296 Repeated falls: Secondary | ICD-10-CM
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
## 2020-03-20 NOTE — Telephone Encounter (Signed)
-----   Message from Debbora Presto, NP sent at 03/20/2020  7:16 AM EDT ----- Please let him know that MRI looks stable. Mild atrophy and microvascular disease noted but nothing major has changed or worsened since last MRI.

## 2020-03-20 NOTE — Telephone Encounter (Signed)
I spoke to wife and gave her the MRI results dated back 02-12-20.  She verbalized understanding and had received the results previously.  I apologized and she was understanding.

## 2020-03-22 ENCOUNTER — Encounter: Payer: Self-pay | Admitting: Physical Therapy

## 2020-03-22 ENCOUNTER — Other Ambulatory Visit: Payer: Self-pay

## 2020-03-22 ENCOUNTER — Ambulatory Visit: Payer: Medicare Other | Admitting: Physical Therapy

## 2020-03-22 DIAGNOSIS — R296 Repeated falls: Secondary | ICD-10-CM

## 2020-03-22 DIAGNOSIS — M6281 Muscle weakness (generalized): Secondary | ICD-10-CM

## 2020-03-22 DIAGNOSIS — R2689 Other abnormalities of gait and mobility: Secondary | ICD-10-CM

## 2020-03-22 NOTE — Therapy (Signed)
Jonathan [name] [place], [place], [address] Phone: [phone]   Fax:  [phone]  Physical Therapy Treatment  Patient Details  Name: Jonathan [name] MRN: [id number] Date of Birth: 1930/11/03 Referring Provider (PT): Josetta Huddle   Encounter Date: 03/22/2020  PT End of Session - 03/22/20 1155    Visit Number  15    Date for PT Re-Evaluation  04/03/20    Authorization Type  MC    PT Start Time  1100    PT Stop Time  1151    PT Time Calculation (min)  51 min    Activity Tolerance  Patient tolerated treatment well    Behavior During Therapy  [facility] for tasks assessed/performed       Past Medical History:  Diagnosis Date  . Acne rosacea   . Arthritis   . Atrial fibrillation ([place])   . Borderline diabetes   . CAD (coronary artery disease)   . Diabetes mellitus without complication ([place])   . Diverticulosis   . Elevated PSA   . Esophageal stricture   . GERD (gastroesophageal reflux disease)   . History of nuclear stress test    Nuclear stress test 6/18: EF 59, small apical defect (?old MI); no ischemia; Low Risk  . Hypertension   . Myocardial infarction ([place])   . Neck fracture ([place]) 2015  . PONV (postoperative nausea and vomiting) 08/09/2014  . Shoulder pain     Past Surgical History:  Procedure Laterality Date  . BALLOON DILATION  04/02/2012   Procedure: BALLOON DILATION;  Surgeon: Arta Silence, MD;  [facility];  Service: Endoscopy;  Laterality: N/A;  . BALLOON DILATION N/A 06/16/2013   Procedure: BALLOON DILATION;  Surgeon: Arta Silence, MD;  [facility];  Service: Endoscopy;  Laterality: N/A;  . BALLOON DILATION N/A 11/16/2014   Procedure: BALLOON DILATION;  Surgeon: Arta Silence, MD;  [facility];  Service: Endoscopy;  Laterality: N/A;  . BALLOON DILATION N/A 02/13/2017   Procedure: BALLOON DILATION;  Surgeon: Arta Silence, MD;  Location: [facility] ENDOSCOPY;  Service:  Endoscopy;  Laterality: N/A;  . CORNEAL TRANSPLANT    . ESOPHAGOGASTRODUODENOSCOPY  04/02/2012   Procedure: ESOPHAGOGASTRODUODENOSCOPY (EGD);  Surgeon: Arta Silence, MD;  Location: [facility];  Service: Endoscopy;  Laterality: N/A;  . ESOPHAGOGASTRODUODENOSCOPY N/A 05/28/2013   Procedure: ESOPHAGOGASTRODUODENOSCOPY (EGD);  Surgeon: Cleotis Nipper, MD;  Location: [facility];  Service: Endoscopy;  Laterality: N/A;  . ESOPHAGOGASTRODUODENOSCOPY N/A 02/20/2015   Procedure: ESOPHAGOGASTRODUODENOSCOPY (EGD);  Surgeon: Arta Silence, MD;  Location: [facility];  Service: Endoscopy;  Laterality: N/A;  . ESOPHAGOGASTRODUODENOSCOPY (EGD) WITH PROPOFOL N/A 06/16/2013   Procedure: ESOPHAGOGASTRODUODENOSCOPY (EGD) WITH PROPOFOL;  Surgeon: Arta Silence, MD;  [facility];  Service: Endoscopy;  Laterality: N/A;  . ESOPHAGOGASTRODUODENOSCOPY (EGD) WITH PROPOFOL N/A 11/16/2014   Procedure: ESOPHAGOGASTRODUODENOSCOPY (EGD) WITH PROPOFOL;  Surgeon: Arta Silence, MD;  [facility];  Service: Endoscopy;  Laterality: N/A;  . ESOPHAGOGASTRODUODENOSCOPY (EGD) WITH PROPOFOL N/A 02/13/2017   Procedure: ESOPHAGOGASTRODUODENOSCOPY (EGD) WITH PROPOFOL;  Surgeon: Arta Silence, MD;  Location: [facility];  Service: Endoscopy;  Laterality: N/A;  . EYE SURGERY     cataract sx  . HERNIA REPAIR     x 2  . TOTAL HIP ARTHROPLASTY     bilateral    There were no vitals filed for this visit.  Subjective Assessment - 03/22/20 1105    Subjective  Patient reports no falls    Currently in Pain?  No/denies                        [facility] Adult PT Treatment/Exercise - 03/22/20 0001      Ambulation/Gait   Gait Comments  gait outside mostly in the grass and on uneven surfaces working on safety, needed education on what to watch out for and to take his time      High Level Balance   High Level Balance Comments  walking ball toss, 6" toe touches without device      Knee/Hip Exercises:  Aerobic   Nustep  level 5 x 7 minutes      Knee/Hip Exercises: Machines for Strengthening   Cybex Knee Extension  10# 2x10    Cybex Knee Flexion  25# 3x10    Other Machine  5# standing straight arm pulls alternating      Knee/Hip Exercises: Standing   Other Standing Knee Exercises  2.5# hip abduction and extension               PT Short Term Goals - 02/11/20 1012      PT SHORT TERM GOAL #1   Title  independent with initial HEP    Status  Partially Met        PT Long Term Goals - 03/22/20 1157      PT LONG TERM GOAL #2   Title  increase Berg to 42/56    Status  Partially Met            Plan - 03/22/20 1155    Clinical Impression Statement  I added some weight today and then we tried walking ball toss, he did well with this, his legs were fatigued with the increase of weight, I worked with him on safety walking in grass and on uneven surfaces, tends to be out of control and needs a lot of cues to slow down, watch where he is going and avoid holes, roots etc...    PT Next Visit Plan  continue to work on function and balance and safety    Consulted and Agree with Plan of Care  Patient       Patient will benefit from skilled therapeutic intervention in order to improve the following deficits and impairments:  Abnormal gait, Decreased range of motion, Difficulty walking, Decreased endurance, Decreased activity tolerance, Improper body mechanics, Impaired flexibility, Decreased balance, Decreased mobility, Decreased strength, Postural dysfunction  Visit Diagnosis: Muscle weakness (generalized)  Other abnormalities of gait and mobility  Repeated falls     Problem List Patient Active Problem List   Diagnosis Date Noted  . Obstructive sleep apnea on CPAP 10/05/2018  . Aspiration pneumonia due to gastric secretions ([place])   . Hypervolemia   . Hypokalemia 03/15/2018  . Hypomagnesemia 03/15/2018  . Bladder mass 03/15/2018  . Small bowel obstruction ([place])  03/11/2018  . Diabetes mellitus ([place]) 03/11/2018  . GERD (gastroesophageal reflux disease) 03/11/2018  . Esophageal stricture 03/11/2018  . SBO (small bowel obstruction) ([place])   . Symptomatic sinus bradycardia   . Paroxysmal atrial fibrillation ([place]) 02/14/2017  . Essential hypertension 02/14/2017  . CAD (coronary artery disease) 07/10/2015  . Fatigue 07/10/2015  . PONV (postoperative nausea and vomiting) 08/09/2014  . Brow ptosis 06/08/2013  . Dermatochalasis 09/23/2012  . Diabetes mellitus type 2, diet-controlled ([place]) 09/23/2012  . After-cataract 12/26/2011  . Bilateral dry eyes 12/26/2011  . Blepharitis 10/03/2011  . Status post corneal transplant 10/03/2011  . Pseudophakia, both eyes 08/09/2006  Sumner Boast., PT 03/22/2020, 11:58 AM  [address] [place], [place], [address] Phone: [phone]   Fax:  [phone]  Name: Jonathan [name] MRN: [id number] Date of Birth: 04-06-1931

## 2020-03-28 ENCOUNTER — Encounter: Payer: Self-pay | Admitting: Physical Therapy

## 2020-03-28 ENCOUNTER — Other Ambulatory Visit: Payer: Self-pay

## 2020-03-28 ENCOUNTER — Ambulatory Visit: Payer: Medicare Other | Admitting: Physical Therapy

## 2020-03-28 DIAGNOSIS — M6281 Muscle weakness (generalized): Secondary | ICD-10-CM | POA: Diagnosis not present

## 2020-03-28 DIAGNOSIS — R2689 Other abnormalities of gait and mobility: Secondary | ICD-10-CM

## 2020-03-28 DIAGNOSIS — R296 Repeated falls: Secondary | ICD-10-CM

## 2020-03-28 NOTE — Therapy (Signed)
[place], [place], [address] Phone: [phone]   Fax:  [phone]  Physical Therapy Treatment  Patient Details  Name: Jonathan [name] MRN: [id number] Date of Birth: [birth year] Referring Provider (PT): Josetta Huddle   Encounter Date: 03/28/2020   PT End of Session - 03/28/20 0959    Visit Number 16    Date for PT Re-Evaluation 04/03/20    Authorization Type MC    PT Start Time 0928    PT Stop Time 1013    PT Time Calculation (min) 45 min    Activity Tolerance Patient tolerated treatment well    Behavior During Therapy [facility] for tasks assessed/performed           Past Medical History:  Diagnosis Date  . Acne rosacea   . Arthritis   . Atrial fibrillation ([place])   . Borderline diabetes   . CAD (coronary artery disease)   . Diabetes mellitus without complication ([place])   . Diverticulosis   . Elevated PSA   . Esophageal stricture   . GERD (gastroesophageal reflux disease)   . History of nuclear stress test    Nuclear stress test 6/18: EF 59, small apical defect (?old MI); no ischemia; Low Risk  . Hypertension   . Myocardial infarction ([place])   . Neck fracture ([place]) 2015  . PONV (postoperative nausea and vomiting) 08/09/2014  . Shoulder pain     Past Surgical History:  Procedure Laterality Date  . BALLOON DILATION  04/02/2012   Procedure: BALLOON DILATION;  Surgeon: Arta Silence, MD;  [facility];  Service: Endoscopy;  Laterality: N/A;  . BALLOON DILATION N/A 06/16/2013   Procedure: BALLOON DILATION;  Surgeon: Arta Silence, MD;  [facility];  Service: Endoscopy;  Laterality: N/A;  . BALLOON DILATION N/A 11/16/2014   Procedure: BALLOON DILATION;  Surgeon: Arta Silence, MD;  [facility];  Service: Endoscopy;  Laterality: N/A;  . BALLOON DILATION N/A 02/13/2017   Procedure: BALLOON DILATION;  Surgeon: Arta Silence, MD;  Location[facility];  Service:  Endoscopy;  Laterality: N/A;  . CORNEAL TRANSPLANT    . ESOPHAGOGASTRODUODENOSCOPY  04/02/2012   Procedure: ESOPHAGOGASTRODUODENOSCOPY (EGD);  Surgeon: Arta Silence, MD;  Location: [facility];  Service: Endoscopy;  Laterality: N/A;  . ESOPHAGOGASTRODUODENOSCOPY N/A 05/28/2013   Procedure: ESOPHAGOGASTRODUODENOSCOPY (EGD);  Surgeon: Cleotis Nipper, MD;  Location: [facility];  Service: Endoscopy;  Laterality: N/A;  . ESOPHAGOGASTRODUODENOSCOPY N/A 02/20/2015   Procedure: ESOPHAGOGASTRODUODENOSCOPY (EGD);  Surgeon: Arta Silence, MD;  Location: [facility];  Service: Endoscopy;  Laterality: N/A;  . ESOPHAGOGASTRODUODENOSCOPY (EGD) WITH PROPOFOL N/A 06/16/2013   Procedure: ESOPHAGOGASTRODUODENOSCOPY (EGD) WITH PROPOFOL;  Surgeon: Arta Silence, MD;  [facility];  Service: Endoscopy;  Laterality: N/A;  . ESOPHAGOGASTRODUODENOSCOPY (EGD) WITH PROPOFOL N/A 11/16/2014   Procedure: ESOPHAGOGASTRODUODENOSCOPY (EGD) WITH PROPOFOL;  Surgeon: Arta Silence, MD;  [facility];  Service: Endoscopy;  Laterality: N/A;  . ESOPHAGOGASTRODUODENOSCOPY (EGD) WITH PROPOFOL N/A 02/13/2017   Procedure: ESOPHAGOGASTRODUODENOSCOPY (EGD) WITH PROPOFOL;  Surgeon: Arta Silence, MD;  Location: [facility];  Service: Endoscopy;  Laterality: N/A;  . EYE SURGERY     cataract sx  . HERNIA REPAIR     x 2  . TOTAL HIP ARTHROPLASTY     bilateral    There were no vitals filed for this visit.   Subjective Assessment - 03/28/20 0933    Subjective No falls, no pain reported, wife does report the issue with the bed  mobility    Currently in Pain? No/denies                             [facility] Adult PT Treatment/Exercise - 03/28/20 0001      Transfers   Comments worked on bed mobility, trunk rotaiton upper and lower, then tried to work on problem solving to get up without help from wife, he needed a lot of cues, he wanted to just try to sit up.      High Level Balance   High Level  Balance Activities Negotiating over obstacles      Knee/Hip Exercises: Aerobic   Nustep level 5 x 7 minutes    Other Aerobic UBE level 5 x 5 minutes      Knee/Hip Exercises: Supine   Bridges Both;2 sets;10 reps    Other Supine Knee/Hip Exercises feet on ball K2C, trunk rotation, small bridges and isometric abs                    PT Short Term Goals - 02/11/20 1012      PT SHORT TERM GOAL #1   Title independent with initial HEP    Status Partially Met             PT Long Term Goals - 03/22/20 1157      PT LONG TERM GOAL #2   Title increase Berg to 42/56    Status Partially Met                 Plan - 03/28/20 0959    Clinical Impression Statement Patients wife felt like the bed mobility was getting worse, he tends to want to try to sit up from supine to get up out of bed, he does not have the abdominal strength to do this, he needs a lot of cues to roll to the side first, he reports fear of falling out of bed with this, worked on the rolling and trying to get him used to this, then pushing up onto elbow to come up to sitting    PT Next Visit Plan work on bed mobility    Consulted and Agree with Plan of Care Patient           Patient will benefit from skilled therapeutic intervention in order to improve the following deficits and impairments:  Abnormal gait, Decreased range of motion, Difficulty walking, Decreased endurance, Decreased activity tolerance, Improper body mechanics, Impaired flexibility, Decreased balance, Decreased mobility, Decreased strength, Postural dysfunction  Visit Diagnosis: Muscle weakness (generalized)  Other abnormalities of gait and mobility  Repeated falls     Problem List Patient Active Problem List   Diagnosis Date Noted  . Obstructive sleep apnea on CPAP 10/05/2018  . Aspiration pneumonia due to gastric secretions ([place])   . Hypervolemia   . Hypokalemia 03/15/2018  . Hypomagnesemia 03/15/2018  . Bladder mass  03/15/2018  . Small bowel obstruction ([place]) 03/11/2018  . Diabetes mellitus ([place]) 03/11/2018  . GERD (gastroesophageal reflux disease) 03/11/2018  . Esophageal stricture 03/11/2018  . SBO (small bowel obstruction) ([place])   . Symptomatic sinus bradycardia   . Paroxysmal atrial fibrillation ([place]) 02/14/2017  . Essential hypertension 02/14/2017  . CAD (coronary artery disease) 07/10/2015  . Fatigue 07/10/2015  . PONV (postoperative nausea and vomiting) 08/09/2014  . Brow ptosis 06/08/2013  . Dermatochalasis 09/23/2012  . Diabetes mellitus type 2, diet-controlled ([place]) 09/23/2012  . After-cataract 12/26/2011  .  Bilateral dry eyes 12/26/2011  . Blepharitis 10/03/2011  . Status post corneal transplant 10/03/2011  . Pseudophakia, both eyes 08/09/2006    Sumner Boast., PT 03/28/2020, 10:13 AM  [address] [place], [place], [address] Phone: [phone]   Fax:  [phone]  Name: Jonathan [name] MRN: [id number] Date of Birth: 12/08/1930

## 2020-03-30 ENCOUNTER — Other Ambulatory Visit: Payer: Self-pay

## 2020-03-30 ENCOUNTER — Ambulatory Visit: Payer: Medicare Other | Admitting: Physical Therapy

## 2020-03-30 DIAGNOSIS — R296 Repeated falls: Secondary | ICD-10-CM

## 2020-03-30 DIAGNOSIS — M6281 Muscle weakness (generalized): Secondary | ICD-10-CM | POA: Diagnosis not present

## 2020-03-30 DIAGNOSIS — R2689 Other abnormalities of gait and mobility: Secondary | ICD-10-CM

## 2020-03-30 NOTE — Therapy (Signed)
[place], [place], [address] Phone: [phone]   Fax:  [phone]  Physical Therapy Treatment  Patient Details  Name: Jonathan [name] MRN: [id number] Date of Birth: 1931-05-09 Referring Provider (PT): Josetta Huddle   Encounter Date: 03/30/2020   PT End of Session - 03/30/20 1521    Visit Number 17    Date for PT Re-Evaluation 04/03/20    PT Start Time 1448    PT Stop Time 1530    PT Time Calculation (min) 42 min           Past Medical History:  Diagnosis Date  . Acne rosacea   . Arthritis   . Atrial fibrillation ([place])   . Borderline diabetes   . CAD (coronary artery disease)   . Diabetes mellitus without complication ([place])   . Diverticulosis   . Elevated PSA   . Esophageal stricture   . GERD (gastroesophageal reflux disease)   . History of nuclear stress test    Nuclear stress test 6/18: EF 59, small apical defect (?old MI); no ischemia; Low Risk  . Hypertension   . Myocardial infarction ([place])   . Neck fracture ([place]) 2015  . PONV (postoperative nausea and vomiting) 08/09/2014  . Shoulder pain     Past Surgical History:  Procedure Laterality Date  . BALLOON DILATION  04/02/2012   Procedure: BALLOON DILATION;  Surgeon: Arta Silence, MD;  [facility];  Service: Endoscopy;  Laterality: N/A;  . BALLOON DILATION N/A 06/16/2013   Procedure: BALLOON DILATION;  Surgeon: Arta Silence, MD;  [facility];  Service: Endoscopy;  Laterality: N/A;  . BALLOON DILATION N/A 11/16/2014   Procedure: BALLOON DILATION;  Surgeon: Arta Silence, MD;  [facility];  Service: Endoscopy;  Laterality: N/A;  . BALLOON DILATION N/A 02/13/2017   Procedure: BALLOON DILATION;  Surgeon: Arta Silence, MD;  [facility]COPY;  Service: Endoscopy;  Laterality: N/A;  . CORNEAL TRANSPLANT    . ESOPHAGOGASTRODUODENOSCOPY  04/02/2012   Procedure: ESOPHAGOGASTRODUODENOSCOPY (EGD);   Surgeon: Arta Silence, MD;  [facility];  Service: Endoscopy;  Laterality: N/A;  . ESOPHAGOGASTRODUODENOSCOPY N/A 05/28/2013   Procedure: ESOPHAGOGASTRODUODENOSCOPY (EGD);  Surgeon: Cleotis Nipper, MD;  [facility];  Service: Endoscopy;  Laterality: N/A;  . ESOPHAGOGASTRODUODENOSCOPY N/A 02/20/2015   Procedure: ESOPHAGOGASTRODUODENOSCOPY (EGD);  Surgeon: Arta Silence, MD;  Location[facility];  Service: Endoscopy;  Laterality: N/A;  . ESOPHAGOGASTRODUODENOSCOPY (EGD) WITH PROPOFOL N/A 06/16/2013   Procedure: ESOPHAGOGASTRODUODENOSCOPY (EGD) WITH PROPOFOL;  Surgeon: Arta Silence, MD;  [facility];  Service: Endoscopy;  Laterality: N/A;  . ESOPHAGOGASTRODUODENOSCOPY (EGD) WITH PROPOFOL N/A 11/16/2014   Procedure: ESOPHAGOGASTRODUODENOSCOPY (EGD) WITH PROPOFOL;  Surgeon: Arta Silence, MD;  [facility];  Service: Endoscopy;  Laterality: N/A;  . ESOPHAGOGASTRODUODENOSCOPY (EGD) WITH PROPOFOL N/A 02/13/2017   Procedure: ESOPHAGOGASTRODUODENOSCOPY (EGD) WITH PROPOFOL;  Surgeon: Arta Silence, MD;  Location: [facility];  Service: Endoscopy;  Laterality: N/A;  . EYE SURGERY     cataract sx  . HERNIA REPAIR     x 2  . TOTAL HIP ARTHROPLASTY     bilateral    There were no vitals filed for this visit.   Subjective Assessment - 03/30/20 1450    Subjective doing okay    Currently in Pain? No/denies                             [facility]  Adult PT Treatment/Exercise - 03/30/20 0001      High Level Balance   High Level Balance Activities Backward walking;Marching forwards;Side stepping   HHA , cuing needed and occassional unsteadiness     Knee/Hip Exercises: Aerobic   Nustep level 5 x 7 minutes    Other Aerobic UBE level 5 x 5 minutes      Knee/Hip Exercises: Machines for Strengthening   Cybex Knee Extension 10# 2x10    Cybex Knee Flexion 25# 3x10    Other Machine black tband trunk flex and ext 2 set s 10 to increase core strength  for increased ease of bed mobility      Knee/Hip Exercises: Seated   Sit to Sand 2 sets;10 reps;without UE support   on airex with min A d/t LOB posterior                   PT Short Term Goals - 02/11/20 1012      PT SHORT TERM GOAL #1   Title independent with initial HEP    Status Partially Met             PT Long Term Goals - 03/22/20 1157      PT LONG TERM GOAL #2   Title increase Berg to 42/56    Status Partially Met                 Plan - 03/30/20 1522    Clinical Impression Statement added in core strengthening to aid in bed mobility,continued with balance and LE strengthening with cuing and assistance needed    PT Treatment/Interventions ADLs/Self Care Home Management;Gait training;Neuromuscular re-education;Balance training;Therapeutic exercise;Therapeutic activities;Functional mobility training;Stair training;Patient/family education;Manual techniques    PT Next Visit Plan work on bed mobility           Patient will benefit from skilled therapeutic intervention in order to improve the following deficits and impairments:  Abnormal gait, Decreased range of motion, Difficulty walking, Decreased endurance, Decreased activity tolerance, Improper body mechanics, Impaired flexibility, Decreased balance, Decreased mobility, Decreased strength, Postural dysfunction  Visit Diagnosis: Other abnormalities of gait and mobility  Repeated falls  Muscle weakness (generalized)     Problem List Patient Active Problem List   Diagnosis Date Noted  . Obstructive sleep apnea on CPAP 10/05/2018  . Aspiration pneumonia due to gastric secretions ([place])   . Hypervolemia   . Hypokalemia 03/15/2018  . Hypomagnesemia 03/15/2018  . Bladder mass 03/15/2018  . Small bowel obstruction ([place]) 03/11/2018  . Diabetes mellitus ([place]) 03/11/2018  . GERD (gastroesophageal reflux disease) 03/11/2018  . Esophageal stricture 03/11/2018  . SBO (small bowel obstruction) ([place])    . Symptomatic sinus bradycardia   . Paroxysmal atrial fibrillation ([place]) 02/14/2017  . Essential hypertension 02/14/2017  . CAD (coronary artery disease) 07/10/2015  . Fatigue 07/10/2015  . PONV (postoperative nausea and vomiting) 08/09/2014  . Brow ptosis 06/08/2013  . Dermatochalasis 09/23/2012  . Diabetes mellitus type 2, diet-controlled ([place]) 09/23/2012  . After-cataract 12/26/2011  . Bilateral dry eyes 12/26/2011  . Blepharitis 10/03/2011  . Status post corneal transplant 10/03/2011  . Pseudophakia, both eyes 08/09/2006    Delan Ksiazek,ANGIE PTA 03/30/2020, 3:24 PM  [place] [address] [place], [place], [address] Phone: [phone]   Fax:  [phone]  Name: Jareth Pardee MRN: [id number] Date of Birth: [birth year]

## 2020-04-04 ENCOUNTER — Other Ambulatory Visit: Payer: Self-pay

## 2020-04-04 ENCOUNTER — Ambulatory Visit: Payer: Medicare Other | Admitting: Physical Therapy

## 2020-04-04 ENCOUNTER — Encounter: Payer: Self-pay | Admitting: Physical Therapy

## 2020-04-04 DIAGNOSIS — R296 Repeated falls: Secondary | ICD-10-CM

## 2020-04-04 DIAGNOSIS — M6281 Muscle weakness (generalized): Secondary | ICD-10-CM | POA: Diagnosis not present

## 2020-04-04 NOTE — Therapy (Signed)
[address] [place], [place], [address] Phone: [phone]   Fax:  [phone]  Physical Therapy Treatment  Patient Details  Name: Jonathan [name] MRN: [id number] Date of Birth: [birth year] Referring Provider (PT): Josetta Huddle   Encounter Date: 04/04/2020   PT End of Session - 04/04/20 1017    Visit Number 18    Date for PT Re-Evaluation 05/04/20    Authorization Type MC    PT Start Time 0930    PT Stop Time 1013    PT Time Calculation (min) 43 min    Activity Tolerance Patient tolerated treatment well    Behavior During Therapy [facility] for tasks assessed/performed           Past Medical History:  Diagnosis Date  . Acne rosacea   . Arthritis   . Atrial fibrillation ([place])   . Borderline diabetes   . CAD (coronary artery disease)   . Diabetes mellitus without complication ([place])   . Diverticulosis   . Elevated PSA   . Esophageal stricture   . GERD (gastroesophageal reflux disease)   . History of nuclear stress test    Nuclear stress test 6/18: EF 59, small apical defect (?old MI); no ischemia; Low Risk  . Hypertension   . Myocardial infarction ([place])   . Neck fracture ([place]) 2015  . PONV (postoperative nausea and vomiting) 08/09/2014  . Shoulder pain     Past Surgical History:  Procedure Laterality Date  . BALLOON DILATION  04/02/2012   Procedure: BALLOON DILATION;  Surgeon: Arta Silence, MD;  [facility];  Service: Endoscopy;  Laterality: N/A;  . BALLOON DILATION N/A 06/16/2013   Procedure: BALLOON DILATION;  Surgeon: Arta Silence, MD;  [facility];  Service: Endoscopy;  Laterality: N/A;  . BALLOON DILATION N/A 11/16/2014   Procedure: BALLOON DILATION;  Surgeon: Arta Silence, MD;  [facility];  Service: Endoscopy;  Laterality: N/A;  . BALLOON DILATION N/A 02/13/2017   Procedure: BALLOON DILATION;  Surgeon: Arta Silence, MD;  Location: [facility] ENDOSCOPY;  Service:  Endoscopy;  Laterality: N/A;  . CORNEAL TRANSPLANT    . ESOPHAGOGASTRODUODENOSCOPY  04/02/2012   Procedure: ESOPHAGOGASTRODUODENOSCOPY (EGD);  Surgeon: Arta Silence, MD;  Location: [facility];  Service: Endoscopy;  Laterality: N/A;  . ESOPHAGOGASTRODUODENOSCOPY N/A 05/28/2013   Procedure: ESOPHAGOGASTRODUODENOSCOPY (EGD);  Surgeon: Cleotis Nipper, MD;  Location: [facility];  Service: Endoscopy;  Laterality: N/A;  . ESOPHAGOGASTRODUODENOSCOPY N/A 02/20/2015   Procedure: ESOPHAGOGASTRODUODENOSCOPY (EGD);  Surgeon: Arta Silence, MD;  Location: [facility];  Service: Endoscopy;  Laterality: N/A;  . ESOPHAGOGASTRODUODENOSCOPY (EGD) WITH PROPOFOL N/A 06/16/2013   Procedure: ESOPHAGOGASTRODUODENOSCOPY (EGD) WITH PROPOFOL;  Surgeon: Arta Silence, MD;  [facility];  Service: Endoscopy;  Laterality: N/A;  . ESOPHAGOGASTRODUODENOSCOPY (EGD) WITH PROPOFOL N/A 11/16/2014   Procedure: ESOPHAGOGASTRODUODENOSCOPY (EGD) WITH PROPOFOL;  Surgeon: Arta Silence, MD;  [facility];  Service: Endoscopy;  Laterality: N/A;  . ESOPHAGOGASTRODUODENOSCOPY (EGD) WITH PROPOFOL N/A 02/13/2017   Procedure: ESOPHAGOGASTRODUODENOSCOPY (EGD) WITH PROPOFOL;  Surgeon: Arta Silence, MD;  Location: [place];  Service: Endoscopy;  Laterality: N/A;  . EYE SURGERY     cataract sx  . HERNIA REPAIR     x 2  . TOTAL HIP ARTHROPLASTY     bilateral    There were no vitals filed for this visit.   Subjective Assessment - 04/04/20 0930    Subjective pretty good, no falls    Currently in Pain? No/denies  [facility] PT Assessment - 04/04/20 0001      Assessment   Medical Diagnosis weakness, unsteady gait    Referring Provider (PT) Josetta Huddle      Ambulation/Gait   Gait Comments no cane in hall some obstacle negotiating, close supervision needed, hit the wall 1x      Timed Up and Go Test   Normal TUG (seconds) 12                         [facility] Adult PT  Treatment/Exercise - 04/04/20 0001      Transfers   Comments bed mobility sit to supine and supine to sit, working on movement while on back as well to adjust in the bed      Knee/Hip Exercises: Aerobic   Recumbent Bike x 7 minutes    Nustep level 5 x 7 minutes      Knee/Hip Exercises: Machines for Strengthening   Cybex Knee Extension 10# 2x10    Cybex Knee Flexion 25# 3x10      Knee/Hip Exercises: Standing   Other Standing Knee Exercises 6" toe touches wiht SPC    Other Standing Knee Exercises 2.5# hip abduction and extension      Knee/Hip Exercises: Supine   Other Supine Knee/Hip Exercises feet on ball K2C, trunk rotation, small bridges and isometric abs                    PT Short Term Goals - 02/11/20 1012      PT SHORT TERM GOAL #1   Title independent with initial HEP    Status Partially Met             PT Long Term Goals - 04/04/20 1059      PT LONG TERM GOAL #1   Title decrease TUG to 15 seconds    Status Achieved      PT LONG TERM GOAL #2   Title increase Berg to 42/56    Status Partially Met      PT LONG TERM GOAL #3   Title increase LE stregnth to 4/5    Status Partially Met      PT LONG TERM GOAL #4   Title understand fall risks at home    Status Achieved                 Plan - 04/04/20 1018    Clinical Impression Statement worked again on bed mobility one time needing Mod A, one time I put a walker beside the bed and he was able to do on his own.  He is very stiff in the trunk and the hips, reports some right hip catching when he tries to lift the right leg up into the bed, TUG time was faster    PT Frequency 2x / week    PT Duration 4 weeks    PT Treatment/Interventions ADLs/Self Care Home Management;Gait training;Neuromuscular re-education;Balance training;Therapeutic exercise;Therapeutic activities;Functional mobility training;Stair training;Patient/family education;Manual techniques    PT Next Visit Plan will continue to work  on bed mobility and safety    Consulted and Agree with Plan of Care Patient           Patient will benefit from skilled therapeutic intervention in order to improve the following deficits and impairments:  Abnormal gait, Decreased range of motion, Difficulty walking, Decreased endurance, Decreased activity tolerance, Improper body mechanics, Impaired flexibility, Decreased balance, Decreased mobility, Decreased strength, Postural dysfunction  Visit Diagnosis:  Repeated falls - Plan: PT plan of care cert/re-cert  Muscle weakness (generalized) - Plan: PT plan of care cert/re-cert     Problem List Patient Active Problem List   Diagnosis Date Noted  . Obstructive sleep apnea on CPAP 10/05/2018  . Aspiration pneumonia due to gastric secretions ([place])   . Hypervolemia   . Hypokalemia 03/15/2018  . Hypomagnesemia 03/15/2018  . Bladder mass 03/15/2018  . Small bowel obstruction ([place]) 03/11/2018  . Diabetes mellitus ([place]) 03/11/2018  . GERD (gastroesophageal reflux disease) 03/11/2018  . Esophageal stricture 03/11/2018  . SBO (small bowel obstruction) ([place])   . Symptomatic sinus bradycardia   . Paroxysmal atrial fibrillation ([place]) 02/14/2017  . Essential hypertension 02/14/2017  . CAD (coronary artery disease) 07/10/2015  . Fatigue 07/10/2015  . PONV (postoperative nausea and vomiting) 08/09/2014  . Brow ptosis 06/08/2013  . Dermatochalasis 09/23/2012  . Diabetes mellitus type 2, diet-controlled ([place]) 09/23/2012  . After-cataract 12/26/2011  . Bilateral dry eyes 12/26/2011  . Blepharitis 10/03/2011  . Status post corneal transplant 10/03/2011  . Pseudophakia, both eyes 08/09/2006    Sumner Boast., PT 04/04/2020, 11:02 AM  [address] [place], [place], [address] Phone: [phone]   Fax:  [phone]  Name: Jonathan [name] MRN: [id number] Date of Birth: 05-26-1931

## 2020-04-06 ENCOUNTER — Other Ambulatory Visit: Payer: Self-pay

## 2020-04-06 ENCOUNTER — Encounter: Payer: Self-pay | Admitting: Physical Therapy

## 2020-04-06 ENCOUNTER — Ambulatory Visit: Payer: Medicare Other | Admitting: Physical Therapy

## 2020-04-06 DIAGNOSIS — M6281 Muscle weakness (generalized): Secondary | ICD-10-CM | POA: Diagnosis not present

## 2020-04-06 DIAGNOSIS — R2689 Other abnormalities of gait and mobility: Secondary | ICD-10-CM

## 2020-04-06 DIAGNOSIS — R296 Repeated falls: Secondary | ICD-10-CM

## 2020-04-06 NOTE — Therapy (Signed)
[address] [place], [place], [address] Phone: [phone]   Fax:  [phone]  Physical Therapy Treatment  Patient Details  Name: Jonathan [name] MRN: [id number] Date of Birth: 04-24-1931 Referring Provider (PT): Josetta Huddle   Encounter Date: 04/06/2020   PT End of Session - 04/06/20 1139    Visit Number 19    Date for PT Re-Evaluation 05/04/20    Authorization Type MC    PT Start Time 0928    PT Stop Time 3785    PT Time Calculation (min) 47 min    Activity Tolerance Patient tolerated treatment well    Behavior During Therapy [facility] for tasks assessed/performed           Past Medical History:  Diagnosis Date  . Acne rosacea   . Arthritis   . Atrial fibrillation ([place])   . Borderline diabetes   . CAD (coronary artery disease)   . Diabetes mellitus without complication ([place])   . Diverticulosis   . Elevated PSA   . Esophageal stricture   . GERD (gastroesophageal reflux disease)   . History of nuclear stress test    Nuclear stress test 6/18: EF 59, small apical defect (?old MI); no ischemia; Low Risk  . Hypertension   . Myocardial infarction ([place])   . Neck fracture ([place]) 2015  . PONV (postoperative nausea and vomiting) 08/09/2014  . Shoulder pain     Past Surgical History:  Procedure Laterality Date  . BALLOON DILATION  04/02/2012   Procedure: BALLOON DILATION;  Surgeon: Arta Silence, MD;  [facility];  Service: Endoscopy;  Laterality: N/A;  . BALLOON DILATION N/A 06/16/2013   Procedure: BALLOON DILATION;  Surgeon: Arta Silence, MD;  [facility];  Service: Endoscopy;  Laterality: N/A;  . BALLOON DILATION N/A 11/16/2014   Procedure: BALLOON DILATION;  Surgeon: Arta Silence, MD;  [facility];  Service: Endoscopy;  Laterality: N/A;  . BALLOON DILATION N/A 02/13/2017   Procedure: BALLOON DILATION;  Surgeon: Arta Silence, MD;  [facility];  Service:  Endoscopy;  Laterality: N/A;  . CORNEAL TRANSPLANT    . ESOPHAGOGASTRODUODENOSCOPY  04/02/2012   Procedure: ESOPHAGOGASTRODUODENOSCOPY (EGD);  Surgeon: Arta Silence, MD;  Location: [facility];  Service: Endoscopy;  Laterality: N/A;  . ESOPHAGOGASTRODUODENOSCOPY N/A 05/28/2013   Procedure: ESOPHAGOGASTRODUODENOSCOPY (EGD);  Surgeon: Cleotis Nipper, MD;  Location: [facility];  Service: Endoscopy;  Laterality: N/A;  . ESOPHAGOGASTRODUODENOSCOPY N/A 02/20/2015   Procedure: ESOPHAGOGASTRODUODENOSCOPY (EGD);  Surgeon: Arta Silence, MD;  Location: [facility];  Service: Endoscopy;  Laterality: N/A;  . ESOPHAGOGASTRODUODENOSCOPY (EGD) WITH PROPOFOL N/A 06/16/2013   Procedure: ESOPHAGOGASTRODUODENOSCOPY (EGD) WITH PROPOFOL;  Surgeon: Arta Silence, MD;  [facility];  Service: Endoscopy;  Laterality: N/A;  . ESOPHAGOGASTRODUODENOSCOPY (EGD) WITH PROPOFOL N/A 11/16/2014   Procedure: ESOPHAGOGASTRODUODENOSCOPY (EGD) WITH PROPOFOL;  Surgeon: Arta Silence, MD;  [facility];  Service: Endoscopy;  Laterality: N/A;  . ESOPHAGOGASTRODUODENOSCOPY (EGD) WITH PROPOFOL N/A 02/13/2017   Procedure: ESOPHAGOGASTRODUODENOSCOPY (EGD) WITH PROPOFOL;  Surgeon: Arta Silence, MD;  Location: [facility];  Service: Endoscopy;  Laterality: N/A;  . EYE SURGERY     cataract sx  . HERNIA REPAIR     x 2  . TOTAL HIP ARTHROPLASTY     bilateral    There were no vitals filed for this visit.   Subjective Assessment - 04/06/20 0952    Subjective No falls, I had the wife come back with him today and we  talked about the issues with bed mobility    Currently in Pain? No/denies                             [facility] Adult PT Treatment/Exercise - 04/06/20 0001      Transfers   Comments bed mobility sit to supine and supine to sit, working on movement while on back as well to adjust in the bed, had wife with [place] and had her see what verbal cues we use and how to allow him to struggle       High Level Balance   High Level Balance Activities Backward walking;Marching forwards;Side stepping;Negotiating over obstacles;Negotitating around obstacles    High Level Balance Comments ball toss, 6" toe touches      Knee/Hip Exercises: Aerobic   Recumbent Bike x 7 minutes    Nustep level 5 x 7 minutes                    PT Short Term Goals - 02/11/20 1012      PT SHORT TERM GOAL #1   Title independent with initial HEP    Status Partially Met             PT Long Term Goals - 04/06/20 1141      PT LONG TERM GOAL #1   Title decrease TUG to 15 seconds    Status Achieved      PT LONG TERM GOAL #4   Title understand fall risks at home    Status Achieved                 Plan - 04/06/20 1140    Clinical Impression Statement Had wife with [place] today and worked on bed mobility, with verbal cues he could get in and out of bewd with some set up, his wife was very impressed, the biggest issue is they report a soft and high bed that does limit this, he reports that at home he "feels like I am in a hole"    PT Next Visit Plan will continue to work on bed mobility and safety, possible D/C in the next few weeks    Consulted and Agree with Plan of Care Patient           Patient will benefit from skilled therapeutic intervention in order to improve the following deficits and impairments:  Abnormal gait, Decreased range of motion, Difficulty walking, Decreased endurance, Decreased activity tolerance, Improper body mechanics, Impaired flexibility, Decreased balance, Decreased mobility, Decreased strength, Postural dysfunction  Visit Diagnosis: Repeated falls  Muscle weakness (generalized)  Other abnormalities of gait and mobility     Problem List Patient Active Problem List   Diagnosis Date Noted  . Obstructive sleep apnea on CPAP 10/05/2018  . Aspiration pneumonia due to gastric secretions ([place])   . Hypervolemia   . Hypokalemia 03/15/2018  . Hypomagnesemia  03/15/2018  . Bladder mass 03/15/2018  . Small bowel obstruction ([place]) 03/11/2018  . Diabetes mellitus ([place]) 03/11/2018  . GERD (gastroesophageal reflux disease) 03/11/2018  . Esophageal stricture 03/11/2018  . SBO (small bowel obstruction) ([place])   . Symptomatic sinus bradycardia   . Paroxysmal atrial fibrillation ([place]) 02/14/2017  . Essential hypertension 02/14/2017  . CAD (coronary artery disease) 07/10/2015  . Fatigue 07/10/2015  . PONV (postoperative nausea and vomiting) 08/09/2014  . Brow ptosis 06/08/2013  . Dermatochalasis 09/23/2012  . Diabetes mellitus type 2, diet-controlled ([place]) 09/23/2012  .  After-cataract 12/26/2011  . Bilateral dry eyes 12/26/2011  . Blepharitis 10/03/2011  . Status post corneal transplant 10/03/2011  . Pseudophakia, both eyes 08/09/2006    Sumner Boast., PT 04/06/2020, 11:43 AM  [address] [place], [place], [address] Phone: [phone]   Fax:  [phone]  Name: Jonathan [name] MRN: [id number] Date of Birth: 1931/01/31

## 2020-04-07 DIAGNOSIS — I252 Old myocardial infarction: Secondary | ICD-10-CM | POA: Diagnosis not present

## 2020-04-07 DIAGNOSIS — N400 Enlarged prostate without lower urinary tract symptoms: Secondary | ICD-10-CM | POA: Diagnosis not present

## 2020-04-07 DIAGNOSIS — F0390 Unspecified dementia without behavioral disturbance: Secondary | ICD-10-CM | POA: Diagnosis not present

## 2020-04-07 DIAGNOSIS — I10 Essential (primary) hypertension: Secondary | ICD-10-CM | POA: Diagnosis not present

## 2020-04-07 DIAGNOSIS — J40 Bronchitis, not specified as acute or chronic: Secondary | ICD-10-CM | POA: Diagnosis not present

## 2020-04-07 DIAGNOSIS — I25119 Atherosclerotic heart disease of native coronary artery with unspecified angina pectoris: Secondary | ICD-10-CM | POA: Diagnosis not present

## 2020-04-07 DIAGNOSIS — I209 Angina pectoris, unspecified: Secondary | ICD-10-CM | POA: Diagnosis not present

## 2020-04-07 DIAGNOSIS — E119 Type 2 diabetes mellitus without complications: Secondary | ICD-10-CM | POA: Diagnosis not present

## 2020-04-07 DIAGNOSIS — E785 Hyperlipidemia, unspecified: Secondary | ICD-10-CM | POA: Diagnosis not present

## 2020-04-07 DIAGNOSIS — F05 Delirium due to known physiological condition: Secondary | ICD-10-CM | POA: Diagnosis not present

## 2020-04-07 DIAGNOSIS — I2510 Atherosclerotic heart disease of native coronary artery without angina pectoris: Secondary | ICD-10-CM | POA: Diagnosis not present

## 2020-04-07 DIAGNOSIS — I4891 Unspecified atrial fibrillation: Secondary | ICD-10-CM | POA: Diagnosis not present

## 2020-04-11 ENCOUNTER — Encounter: Payer: Self-pay | Admitting: Physical Therapy

## 2020-04-11 ENCOUNTER — Other Ambulatory Visit: Payer: Self-pay

## 2020-04-11 ENCOUNTER — Ambulatory Visit: Payer: Medicare Other | Admitting: Physical Therapy

## 2020-04-11 DIAGNOSIS — R296 Repeated falls: Secondary | ICD-10-CM

## 2020-04-11 DIAGNOSIS — M6281 Muscle weakness (generalized): Secondary | ICD-10-CM | POA: Diagnosis not present

## 2020-04-11 DIAGNOSIS — R2689 Other abnormalities of gait and mobility: Secondary | ICD-10-CM

## 2020-04-11 NOTE — Therapy (Signed)
[address] [place], [place], [address] Phone: [phone]   Fax:  [phone] Progress Note Reporting Period 03/08/20 to 04/11/20 for visits 11-20  See note below for Objective Data and Assessment of Progress/Goals.      Physical Therapy Treatment  Patient Details  Name: Jonathan [name] MRN: [id number] Date of Birth: 1931/05/27 Referring Provider (PT): Josetta Huddle   Encounter Date: 04/11/2020   PT End of Session - 04/11/20 1100    Visit Number 20    Date for PT Re-Evaluation 05/04/20    Authorization Type MC    PT Start Time 0925    PT Stop Time 1005    PT Time Calculation (min) 40 min    Activity Tolerance Patient tolerated treatment well    Behavior During Therapy [facility] for tasks assessed/performed           Past Medical History:  Diagnosis Date  . Acne rosacea   . Arthritis   . Atrial fibrillation ([place])   . Borderline diabetes   . CAD (coronary artery disease)   . Diabetes mellitus without complication ([place])   . Diverticulosis   . Elevated PSA   . Esophageal stricture   . GERD (gastroesophageal reflux disease)   . History of nuclear stress test    Nuclear stress test 6/18: EF 59, small apical defect (?old MI); no ischemia; Low Risk  . Hypertension   . Myocardial infarction ([place])   . Neck fracture ([place]) 2015  . PONV (postoperative nausea and vomiting) 08/09/2014  . Shoulder pain     Past Surgical History:  Procedure Laterality Date  . BALLOON DILATION  04/02/2012   Procedure: BALLOON DILATION;  Surgeon: Arta Silence, MD;  [facility];  Service: Endoscopy;  Laterality: N/A;  . BALLOON DILATION N/A 06/16/2013   Procedure: BALLOON DILATION;  Surgeon: Arta Silence, MD;  [facility];  Service: Endoscopy;  Laterality: N/A;  . BALLOON DILATION N/A 11/16/2014   Procedure: BALLOON DILATION;  Surgeon: Arta Silence, MD;  [facility];  Service: Endoscopy;   Laterality: N/A;  . BALLOON DILATION N/A 02/13/2017   Procedure: BALLOON DILATION;  Surgeon: Arta Silence, MD;  Location: [facility] ENDOSCOPY;  Service: Endoscopy;  Laterality: N/A;  . CORNEAL TRANSPLANT    . ESOPHAGOGASTRODUODENOSCOPY  04/02/2012   Procedure: ESOPHAGOGASTRODUODENOSCOPY (EGD);  Surgeon: Arta Silence, MD;  Location: [facility];  Service: Endoscopy;  Laterality: N/A;  . ESOPHAGOGASTRODUODENOSCOPY N/A 05/28/2013   Procedure: ESOPHAGOGASTRODUODENOSCOPY (EGD);  Surgeon: Cleotis Nipper, MD;  Location: [facility];  Service: Endoscopy;  Laterality: N/A;  . ESOPHAGOGASTRODUODENOSCOPY N/A 02/20/2015   Procedure: ESOPHAGOGASTRODUODENOSCOPY (EGD);  Surgeon: Arta Silence, MD;  Location: [facility];  Service: Endoscopy;  Laterality: N/A;  . ESOPHAGOGASTRODUODENOSCOPY (EGD) WITH PROPOFOL N/A 06/16/2013   Procedure: ESOPHAGOGASTRODUODENOSCOPY (EGD) WITH PROPOFOL;  Surgeon: Arta Silence, MD;  [facility];  Service: Endoscopy;  Laterality: N/A;  . ESOPHAGOGASTRODUODENOSCOPY (EGD) WITH PROPOFOL N/A 11/16/2014   Procedure: ESOPHAGOGASTRODUODENOSCOPY (EGD) WITH PROPOFOL;  Surgeon: Arta Silence, MD;  [facility];  Service: Endoscopy;  Laterality: N/A;  . ESOPHAGOGASTRODUODENOSCOPY (EGD) WITH PROPOFOL N/A 02/13/2017   Procedure: ESOPHAGOGASTRODUODENOSCOPY (EGD) WITH PROPOFOL;  Surgeon: Arta Silence, MD;  Location: [place];  Service: Endoscopy;  Laterality: N/A;  . EYE SURGERY     cataract sx  . HERNIA REPAIR     x 2  . TOTAL HIP ARTHROPLASTY     bilateral    There were no vitals filed for this  visit.   Subjective Assessment - 04/11/20 0933    Subjective Reports that he is still struggling with the bed mobility, he reports that he thinks the bed is too soft                             [facility] Adult PT Treatment/Exercise - 04/11/20 0001      High Level Balance   High Level Balance Activities Side stepping;Backward walking    High Level Balance  Comments ball toss, red tbans scapular stabilization while on airex, and then ball toss on airex      Knee/Hip Exercises: Stretches   Passive Hamstring Stretch Both;2 reps;20 seconds    Gastroc Stretch Both;2 reps;20 seconds      Knee/Hip Exercises: Aerobic   Recumbent Bike x 7 minutes    Other Aerobic UBE level 5 x 5 minutes      Knee/Hip Exercises: Standing   Other Standing Knee Exercises 8" toe touches wiht SPC    Other Standing Knee Exercises 2.5# hip abduction and extension      Knee/Hip Exercises: Seated   Other Seated Knee/Hip Exercises bosu behind partial sit ups 2x10    Other Seated Knee/Hip Exercises worked on seated trunk mobility, rotation, side bending                    PT Short Term Goals - 02/11/20 1012      PT SHORT TERM GOAL #1   Title independent with initial HEP    Status Partially Met             PT Long Term Goals - 04/06/20 1141      PT LONG TERM GOAL #1   Title decrease TUG to 15 seconds    Status Achieved      PT LONG TERM GOAL #4   Title understand fall risks at home    Status Achieved                 Plan - 04/11/20 1101    Clinical Impression Statement Spoke again with wife and patient about the bed mobility, reports that he can do indpendent sometimes and sometimes needs max A, she has ordered the bed rail and should get that this week,    PT Next Visit Plan we will see this one more this week and then skip a week and see how he does, will decrease to 1x/week after that    Consulted and Agree with Plan of Care Patient;Family member/caregiver    Family Member Consulted wife           Patient will benefit from skilled therapeutic intervention in order to improve the following deficits and impairments:  Abnormal gait, Decreased range of motion, Difficulty walking, Decreased endurance, Decreased activity tolerance, Improper body mechanics, Impaired flexibility, Decreased balance, Decreased mobility, Decreased strength,  Postural dysfunction  Visit Diagnosis: Repeated falls  Muscle weakness (generalized)  Other abnormalities of gait and mobility     Problem List Patient Active Problem List   Diagnosis Date Noted  . Obstructive sleep apnea on CPAP 10/05/2018  . Aspiration pneumonia due to gastric secretions ([place])   . Hypervolemia   . Hypokalemia 03/15/2018  . Hypomagnesemia 03/15/2018  . Bladder mass 03/15/2018  . Small bowel obstruction ([place]) 03/11/2018  . Diabetes mellitus ([place]) 03/11/2018  . GERD (gastroesophageal reflux disease) 03/11/2018  . Esophageal stricture 03/11/2018  . SBO (small bowel obstruction) ([employer])   .  Symptomatic sinus bradycardia   . Paroxysmal atrial fibrillation ([place]) 02/14/2017  . Essential hypertension 02/14/2017  . CAD (coronary artery disease) 07/10/2015  . Fatigue 07/10/2015  . PONV (postoperative nausea and vomiting) 08/09/2014  . Brow ptosis 06/08/2013  . Dermatochalasis 09/23/2012  . Diabetes mellitus type 2, diet-controlled ([place]) 09/23/2012  . After-cataract 12/26/2011  . Bilateral dry eyes 12/26/2011  . Blepharitis 10/03/2011  . Status post corneal transplant 10/03/2011  . Pseudophakia, both eyes 08/09/2006    Sumner Boast., PT 04/11/2020, 11:03 AM  [address] [place], [place], [address] Phone: [phone]   Fax:  [phone]  Name: Jonathan [name] MRN: [id number] Date of Birth: November 01, 1930

## 2020-04-13 ENCOUNTER — Encounter: Payer: Self-pay | Admitting: Physical Therapy

## 2020-04-13 ENCOUNTER — Other Ambulatory Visit: Payer: Self-pay

## 2020-04-13 ENCOUNTER — Ambulatory Visit: Payer: Medicare Other | Attending: Internal Medicine | Admitting: Physical Therapy

## 2020-04-13 DIAGNOSIS — M6281 Muscle weakness (generalized): Secondary | ICD-10-CM | POA: Diagnosis not present

## 2020-04-13 DIAGNOSIS — R296 Repeated falls: Secondary | ICD-10-CM

## 2020-04-13 DIAGNOSIS — R2689 Other abnormalities of gait and mobility: Secondary | ICD-10-CM | POA: Diagnosis not present

## 2020-04-13 NOTE — Therapy (Signed)
[place] [address], [place], [address] Phone: [phone]   Fax:  [phone]  Physical Therapy Treatment  Patient Details  Name: Jonathan [name] MRN: [id number] Date of Birth: 05/24/1931 Referring Provider (PT): Josetta Huddle   Encounter Date: 04/13/2020   PT End of Session - 04/13/20 1723    Visit Number 21    Date for PT Re-Evaluation 05/04/20    Authorization Type MC    PT Start Time 1316    PT Stop Time 1400    PT Time Calculation (min) 44 min    Activity Tolerance Patient tolerated treatment well    Behavior During Therapy [facility] for tasks assessed/performed           Past Medical History:  Diagnosis Date  . Acne rosacea   . Arthritis   . Atrial fibrillation ([place])   . Borderline diabetes   . CAD (coronary artery disease)   . Diabetes mellitus without complication ([place])   . Diverticulosis   . Elevated PSA   . Esophageal stricture   . GERD (gastroesophageal reflux disease)   . History of nuclear stress test    Nuclear stress test 6/18: EF 59, small apical defect (?old MI); no ischemia; Low Risk  . Hypertension   . Myocardial infarction ([place])   . Neck fracture ([place]) 2015  . PONV (postoperative nausea and vomiting) 08/09/2014  . Shoulder pain     Past Surgical History:  Procedure Laterality Date  . BALLOON DILATION  04/02/2012   Procedure: BALLOON DILATION;  Surgeon: Arta Silence, MD;  [facility];  Service: Endoscopy;  Laterality: N/A;  . BALLOON DILATION N/A 06/16/2013   Procedure: BALLOON DILATION;  Surgeon: Arta Silence, MD;  [facility];  Service: Endoscopy;  Laterality: N/A;  . BALLOON DILATION N/A 11/16/2014   Procedure: BALLOON DILATION;  Surgeon: Arta Silence, MD;  [facility];  Service: Endoscopy;  Laterality: N/A;  . BALLOON DILATION N/A 02/13/2017   Procedure: BALLOON DILATION;  Surgeon: Arta Silence, MD;  Location: [facility] ENDOSCOPY;  Service:  Endoscopy;  Laterality: N/A;  . CORNEAL TRANSPLANT    . ESOPHAGOGASTRODUODENOSCOPY  04/02/2012   Procedure: ESOPHAGOGASTRODUODENOSCOPY (EGD);  Surgeon: Arta Silence, MD;  Location: [facility];  Service: Endoscopy;  Laterality: N/A;  . ESOPHAGOGASTRODUODENOSCOPY N/A 05/28/2013   Procedure: ESOPHAGOGASTRODUODENOSCOPY (EGD);  Surgeon: Cleotis Nipper, MD;  Location: [facility];  Service: Endoscopy;  Laterality: N/A;  . ESOPHAGOGASTRODUODENOSCOPY N/A 02/20/2015   Procedure: ESOPHAGOGASTRODUODENOSCOPY (EGD);  Surgeon: Arta Silence, MD;  Location: [facility];  Service: Endoscopy;  Laterality: N/A;  . ESOPHAGOGASTRODUODENOSCOPY (EGD) WITH PROPOFOL N/A 06/16/2013   Procedure: ESOPHAGOGASTRODUODENOSCOPY (EGD) WITH PROPOFOL;  Surgeon: Arta Silence, MD;  [facility];  Service: Endoscopy;  Laterality: N/A;  . ESOPHAGOGASTRODUODENOSCOPY (EGD) WITH PROPOFOL N/A 11/16/2014   Procedure: ESOPHAGOGASTRODUODENOSCOPY (EGD) WITH PROPOFOL;  Surgeon: Arta Silence, MD;  [facility];  Service: Endoscopy;  Laterality: N/A;  . ESOPHAGOGASTRODUODENOSCOPY (EGD) WITH PROPOFOL N/A 02/13/2017   Procedure: ESOPHAGOGASTRODUODENOSCOPY (EGD) WITH PROPOFOL;  Surgeon: Arta Silence, MD;  Location: [facility];  Service: Endoscopy;  Laterality: N/A;  . EYE SURGERY     cataract sx  . HERNIA REPAIR     x 2  . TOTAL HIP ARTHROPLASTY     bilateral    There were no vitals filed for this visit.   Subjective Assessment - 04/13/20 1323    Subjective They report that they got the new side rail and he has been  able to use it, has been independent 3x, wife was very pleased    Currently in Pain? No/denies                             [facility] Adult PT Treatment/Exercise - 04/13/20 0001      Transfers   Comments bed mobility sit to supine and supine to sit, working on movement while on back as well to adjust in the bed, had wife with [place] and had her see what verbal cues we use and how to  allow him to struggle      High Level Balance   High Level Balance Activities Side stepping;Backward walking    High Level Balance Comments ball toss, red tbans scapular stabilization while on airex, and then ball toss on airex      Knee/Hip Exercises: Aerobic   Nustep level 5 x 7 minutes    Other Aerobic UBE level 5 x 6 minutes      Knee/Hip Exercises: Standing   Other Standing Knee Exercises 8" toe touches wiht SPC    Other Standing Knee Exercises 2.5# hip abduction and extension      Knee/Hip Exercises: Seated   Other Seated Knee/Hip Exercises bosu behind partial sit ups 2x10    Other Seated Knee/Hip Exercises worked on seated trunk mobility, rotation, side bending                    PT Short Term Goals - 02/11/20 1012      PT SHORT TERM GOAL #1   Title independent with initial HEP    Status Partially Met             PT Long Term Goals - 04/13/20 1738      PT LONG TERM GOAL #3   Title increase LE stregnth to 4/5    Status Partially Met                 Plan - 04/13/20 1736    Clinical Impression Statement Reports that the bed rail has really helped, has done completely independent bed mobility 3 x.  He and her both report that he is moving better, more stable and seems to be safer    PT Next Visit Plan we are going to skip next week and then see the following week, see how he does on his own    Consulted and Agree with Plan of Care Patient;Family member/caregiver    Family Member Consulted wife           Patient will benefit from skilled therapeutic intervention in order to improve the following deficits and impairments:  Abnormal gait, Decreased range of motion, Difficulty walking, Decreased endurance, Decreased activity tolerance, Improper body mechanics, Impaired flexibility, Decreased balance, Decreased mobility, Decreased strength, Postural dysfunction  Visit Diagnosis: Repeated falls  Muscle weakness (generalized)  Other abnormalities  of gait and mobility     Problem List Patient Active Problem List   Diagnosis Date Noted  . Obstructive sleep apnea on CPAP 10/05/2018  . Aspiration pneumonia due to gastric secretions ([place])   . Hypervolemia   . Hypokalemia 03/15/2018  . Hypomagnesemia 03/15/2018  . Bladder mass 03/15/2018  . Small bowel obstruction ([place]) 03/11/2018  . Diabetes mellitus ([place]) 03/11/2018  . GERD (gastroesophageal reflux disease) 03/11/2018  . Esophageal stricture 03/11/2018  . SBO (small bowel obstruction) ([place])   . Symptomatic sinus bradycardia   . Paroxysmal atrial fibrillation (  [place]) 02/14/2017  . Essential hypertension 02/14/2017  . CAD (coronary artery disease) 07/10/2015  . Fatigue 07/10/2015  . PONV (postoperative nausea and vomiting) 08/09/2014  . Brow ptosis 06/08/2013  . Dermatochalasis 09/23/2012  . Diabetes mellitus type 2, diet-controlled ([place]) 09/23/2012  . After-cataract 12/26/2011  . Bilateral dry eyes 12/26/2011  . Blepharitis 10/03/2011  . Status post corneal transplant 10/03/2011  . Pseudophakia, both eyes 08/09/2006    Sumner Boast., PT 04/13/2020, 5:38 PM  [address] [place], [place], [address] Phone: [phone]   Fax:  [phone]  Name: Jonathan [name] MRN: [id number] Date of Birth: Mar 01, 1931

## 2020-04-25 ENCOUNTER — Other Ambulatory Visit: Payer: Self-pay

## 2020-04-25 ENCOUNTER — Encounter: Payer: Self-pay | Admitting: Physical Therapy

## 2020-04-25 ENCOUNTER — Ambulatory Visit: Payer: Medicare Other | Admitting: Physical Therapy

## 2020-04-25 DIAGNOSIS — R2689 Other abnormalities of gait and mobility: Secondary | ICD-10-CM | POA: Diagnosis not present

## 2020-04-25 DIAGNOSIS — R296 Repeated falls: Secondary | ICD-10-CM | POA: Diagnosis not present

## 2020-04-25 DIAGNOSIS — M6281 Muscle weakness (generalized): Secondary | ICD-10-CM | POA: Diagnosis not present

## 2020-04-25 NOTE — Therapy (Signed)
[place], [place], [address] Phone: [phone]   Fax:  [phone]  Physical Therapy Treatment  Patient Details  Name: Jonathan [name] MRN: [id number] Date of Birth: 1931-06-22 Referring Provider (PT): Josetta Huddle   Encounter Date: 04/25/2020   PT End of Session - 04/25/20 1121    Visit Number 22    Date for PT Re-Evaluation 05/04/20    Authorization Type MC    PT Start Time 1017    PT Stop Time 1104    PT Time Calculation (min) 47 min    Activity Tolerance Patient tolerated treatment well    Behavior During Therapy [facility] for tasks assessed/performed           Past Medical History:  Diagnosis Date  . Acne rosacea   . Arthritis   . Atrial fibrillation ([place])   . Borderline diabetes   . CAD (coronary artery disease)   . Diabetes mellitus without complication ([place])   . Diverticulosis   . Elevated PSA   . Esophageal stricture   . GERD (gastroesophageal reflux disease)   . History of nuclear stress test    Nuclear stress test 6/18: EF 59, small apical defect (?old MI); no ischemia; Low Risk  . Hypertension   . Myocardial infarction ([place])   . Neck fracture ([place]) 2015  . PONV (postoperative nausea and vomiting) 08/09/2014  . Shoulder pain     Past Surgical History:  Procedure Laterality Date  . BALLOON DILATION  04/02/2012   Procedure: BALLOON DILATION;  Surgeon: Arta Silence, MD;  [facility];  Service: Endoscopy;  Laterality: N/A;  . BALLOON DILATION N/A 06/16/2013   Procedure: BALLOON DILATION;  Surgeon: Arta Silence, MD;  [facility];  Service: Endoscopy;  Laterality: N/A;  . BALLOON DILATION N/A 11/16/2014   Procedure: BALLOON DILATION;  Surgeon: Arta Silence, MD;  [facility];  Service: Endoscopy;  Laterality: N/A;  . BALLOON DILATION N/A 02/13/2017   Procedure: BALLOON DILATION;  Surgeon: Arta Silence, MD;  Location: [facility] ENDOSCOPY;  Service:  Endoscopy;  Laterality: N/A;  . CORNEAL TRANSPLANT    . ESOPHAGOGASTRODUODENOSCOPY  04/02/2012   Procedure: ESOPHAGOGASTRODUODENOSCOPY (EGD);  Surgeon: Arta Silence, MD;  Location: [facility];  Service: Endoscopy;  Laterality: N/A;  . ESOPHAGOGASTRODUODENOSCOPY N/A 05/28/2013   Procedure: ESOPHAGOGASTRODUODENOSCOPY (EGD);  Surgeon: Cleotis Nipper, MD;  Location: [facility];  Service: Endoscopy;  Laterality: N/A;  . ESOPHAGOGASTRODUODENOSCOPY N/A 02/20/2015   Procedure: ESOPHAGOGASTRODUODENOSCOPY (EGD);  Surgeon: Arta Silence, MD;  Location: [facility];  Service: Endoscopy;  Laterality: N/A;  . ESOPHAGOGASTRODUODENOSCOPY (EGD) WITH PROPOFOL N/A 06/16/2013   Procedure: ESOPHAGOGASTRODUODENOSCOPY (EGD) WITH PROPOFOL;  Surgeon: Arta Silence, MD;  [facility];  Service: Endoscopy;  Laterality: N/A;  . ESOPHAGOGASTRODUODENOSCOPY (EGD) WITH PROPOFOL N/A 11/16/2014   Procedure: ESOPHAGOGASTRODUODENOSCOPY (EGD) WITH PROPOFOL;  Surgeon: Arta Silence, MD;  [facility];  Service: Endoscopy;  Laterality: N/A;  . ESOPHAGOGASTRODUODENOSCOPY (EGD) WITH PROPOFOL N/A 02/13/2017   Procedure: ESOPHAGOGASTRODUODENOSCOPY (EGD) WITH PROPOFOL;  Surgeon: Arta Silence, MD;  Location: [facility];  Service: Endoscopy;  Laterality: N/A;  . EYE SURGERY     cataract sx  . HERNIA REPAIR     x 2  . TOTAL HIP ARTHROPLASTY     bilateral    There were no vitals filed for this visit.   Subjective Assessment - 04/25/20 1023    Subjective Patient reports that he is still using the bed rail with little help  from wife.  He reports no falls over the past 2 weeks    Currently in Pain? No/denies                             [facility] Adult PT Treatment/Exercise - 04/25/20 0001      High Level Balance   High Level Balance Activities Negotiating over obstacles    High Level Balance Comments 6"toe taps with SPC and then while standing on the firm airex      Knee/Hip Exercises:  Aerobic   Recumbent Bike x 6 minutes    Nustep level 5 x 7 minutes      Knee/Hip Exercises: Machines for Strengthening   Cybex Knee Extension 10# 2x10    Cybex Knee Flexion 25# 3x10      Knee/Hip Exercises: Standing   Hip Flexion Both;2 sets;10 reps    Hip Flexion Limitations 2.5#    Hip Abduction Both;2 sets;10 reps    Abduction Limitations 2.5# pain in the right groin with right abduction                    PT Short Term Goals - 02/11/20 1012      PT SHORT TERM GOAL #1   Title independent with initial HEP    Status Partially Met             PT Long Term Goals - 04/25/20 1129      PT LONG TERM GOAL #3   Title increase LE stregnth to 4/5    Status Partially Met      PT LONG TERM GOAL #4   Title understand fall risks at home    Status Partially Met                 Plan - 04/25/20 1121    Clinical Impression Statement Patient is doing well, he had one instance when negotiating over obstacles that he was not turned around and started stepping required max A to stop a fall.,  He does have some impulsivity issues that could lead to a fall, he tends to go fairly fast with head down and stooped posture looking out of control at times.  He does have some right hip pain with some of the standing activities especially abduction    PT Next Visit Plan starting 1x/week will really try to work on his safety to prevent falls    Consulted and Agree with Plan of Care Patient           Patient will benefit from skilled therapeutic intervention in order to improve the following deficits and impairments:  Abnormal gait, Decreased range of motion, Difficulty walking, Decreased endurance, Decreased activity tolerance, Improper body mechanics, Impaired flexibility, Decreased balance, Decreased mobility, Decreased strength, Postural dysfunction  Visit Diagnosis: Repeated falls  Muscle weakness (generalized)  Other abnormalities of gait and mobility     Problem  List Patient Active Problem List   Diagnosis Date Noted  . Obstructive sleep apnea on CPAP 10/05/2018  . Aspiration pneumonia due to gastric secretions ([place])   . Hypervolemia   . Hypokalemia 03/15/2018  . Hypomagnesemia 03/15/2018  . Bladder mass 03/15/2018  . Small bowel obstruction ([place]) 03/11/2018  . Diabetes mellitus ([place]) 03/11/2018  . GERD (gastroesophageal reflux disease) 03/11/2018  . Esophageal stricture 03/11/2018  . SBO (small bowel obstruction) ([place])   . Symptomatic sinus bradycardia   . Paroxysmal atrial fibrillation ([place]) 02/14/2017  .  Essential hypertension 02/14/2017  . CAD (coronary artery disease) 07/10/2015  . Fatigue 07/10/2015  . PONV (postoperative nausea and vomiting) 08/09/2014  . Brow ptosis 06/08/2013  . Dermatochalasis 09/23/2012  . Diabetes mellitus type 2, diet-controlled ([place]) 09/23/2012  . After-cataract 12/26/2011  . Bilateral dry eyes 12/26/2011  . Blepharitis 10/03/2011  . Status post corneal transplant 10/03/2011  . Pseudophakia, both eyes 08/09/2006    Sumner Boast., PT 04/25/2020, 11:30 AM  [place], [place], [address] Phone: [phone]   Fax:  [phone]  Name: Terrin Meddaugh MRN: [id number] Date of Birth: [birth year]

## 2020-05-02 ENCOUNTER — Ambulatory Visit: Payer: Medicare Other | Admitting: Physical Therapy

## 2020-05-02 ENCOUNTER — Other Ambulatory Visit: Payer: Self-pay

## 2020-05-02 ENCOUNTER — Encounter: Payer: Self-pay | Admitting: Physical Therapy

## 2020-05-02 DIAGNOSIS — M6281 Muscle weakness (generalized): Secondary | ICD-10-CM

## 2020-05-02 DIAGNOSIS — R296 Repeated falls: Secondary | ICD-10-CM

## 2020-05-02 DIAGNOSIS — R2689 Other abnormalities of gait and mobility: Secondary | ICD-10-CM

## 2020-05-02 NOTE — Therapy (Signed)
[place], [place], [address] Phone: [phone]   Fax:  [phone]  Physical Therapy Treatment  Patient Details  Name: Jonathan [name] MRN: [id number] Date of Birth: [birth year] Referring Provider (PT): Josetta Huddle   Encounter Date: 05/02/2020   PT End of Session - 05/02/20 1250    Visit Number 23    Date for PT Re-Evaluation 05/04/20    PT Start Time 1014    PT Stop Time 1058    PT Time Calculation (min) 44 min    Activity Tolerance Patient tolerated treatment well    Behavior During Therapy [facility] for tasks assessed/performed           Past Medical History:  Diagnosis Date  . Acne rosacea   . Arthritis   . Atrial fibrillation ([place])   . Borderline diabetes   . CAD (coronary artery disease)   . Diabetes mellitus without complication ([place])   . Diverticulosis   . Elevated PSA   . Esophageal stricture   . GERD (gastroesophageal reflux disease)   . History of nuclear stress test    Nuclear stress test 6/18: EF 59, small apical defect (?old MI); no ischemia; Low Risk  . Hypertension   . Myocardial infarction ([place])   . Neck fracture ([place]) 2015  . PONV (postoperative nausea and vomiting) 08/09/2014  . Shoulder pain     Past Surgical History:  Procedure Laterality Date  . BALLOON DILATION  04/02/2012   Procedure: BALLOON DILATION;  Surgeon: Arta Silence, MD;  [facility];  Service: Endoscopy;  Laterality: N/A;  . BALLOON DILATION N/A 06/16/2013   Procedure: BALLOON DILATION;  Surgeon: Arta Silence, MD;  [facility];  Service: Endoscopy;  Laterality: N/A;  . BALLOON DILATION N/A 11/16/2014   Procedure: BALLOON DILATION;  Surgeon: Arta Silence, MD;  [facility];  Service: Endoscopy;  Laterality: N/A;  . BALLOON DILATION N/A 02/13/2017   Procedure: BALLOON DILATION;  Surgeon: Arta Silence, MD;  Location: [facility] ENDOSCOPY;  Service: Endoscopy;  Laterality:  N/A;  . CORNEAL TRANSPLANT    . ESOPHAGOGASTRODUODENOSCOPY  04/02/2012   Procedure: ESOPHAGOGASTRODUODENOSCOPY (EGD);  Surgeon: Arta Silence, MD;  Location: [facility];  Service: Endoscopy;  Laterality: N/A;  . ESOPHAGOGASTRODUODENOSCOPY N/A 05/28/2013   Procedure: ESOPHAGOGASTRODUODENOSCOPY (EGD);  Surgeon: Cleotis Nipper, MD;  Location: [facility];  Service: Endoscopy;  Laterality: N/A;  . ESOPHAGOGASTRODUODENOSCOPY N/A 02/20/2015   Procedure: ESOPHAGOGASTRODUODENOSCOPY (EGD);  Surgeon: Arta Silence, MD;  Location: [facility];  Service: Endoscopy;  Laterality: N/A;  . ESOPHAGOGASTRODUODENOSCOPY (EGD) WITH PROPOFOL N/A 06/16/2013   Procedure: ESOPHAGOGASTRODUODENOSCOPY (EGD) WITH PROPOFOL;  Surgeon: Arta Silence, MD;  [facility];  Service: Endoscopy;  Laterality: N/A;  . ESOPHAGOGASTRODUODENOSCOPY (EGD) WITH PROPOFOL N/A 11/16/2014   Procedure: ESOPHAGOGASTRODUODENOSCOPY (EGD) WITH PROPOFOL;  Surgeon: Arta Silence, MD;  [facility];  Service: Endoscopy;  Laterality: N/A;  . ESOPHAGOGASTRODUODENOSCOPY (EGD) WITH PROPOFOL N/A 02/13/2017   Procedure: ESOPHAGOGASTRODUODENOSCOPY (EGD) WITH PROPOFOL;  Surgeon: Arta Silence, MD;  Location: [facility];  Service: Endoscopy;  Laterality: N/A;  . EYE SURGERY     cataract sx  . HERNIA REPAIR     x 2  . TOTAL HIP ARTHROPLASTY     bilateral    There were no vitals filed for this visit.   Subjective Assessment - 05/02/20 1027    Subjective no falls, reports feeling better, reports that he is tired most of the time    Currently in  Pain? No/denies              [facility] PT Assessment - 05/02/20 0001      Berg Balance Test   Sit to Stand Able to stand without using hands and stabilize independently    Standing Unsupported Able to stand safely 2 minutes    Sitting with Back Unsupported but Feet Supported on Floor or Stool Able to sit safely and securely 2 minutes    Stand to Sit Sits safely with minimal use of hands     Transfers Able to transfer safely, definite need of hands    Standing Unsupported with Eyes Closed Able to stand 10 seconds safely    Standing Unsupported with Feet Together Able to place feet together independently and stand 1 minute safely    From Standing, Reach Forward with Outstretched Arm Can reach forward >12 cm safely (5")    From Standing Position, Pick up Object from Floor Able to pick up shoe, needs supervision    From Standing Position, Turn to Look Behind Over each Shoulder Turn sideways only but maintains balance    Turn 360 Degrees Able to turn 360 degrees safely one side only in 4 seconds or less    Standing Unsupported, Alternately Place Feet on Step/Stool Able to complete 4 steps without aid or supervision    Standing Unsupported, One Foot in Front Able to take small step independently and hold 30 seconds    Standing on One Leg Tries to lift leg/unable to hold 3 seconds but remains standing independently    Total Score 43      Timed Up and Go Test   Normal TUG (seconds) 13                         [facility] Adult PT Treatment/Exercise - 05/02/20 0001      Ambulation/Gait   Gait Comments gait outside on pavers, on slopes, on grass, CGA      High Level Balance   High Level Balance Activities Direction changes;Sudden stops;Negotiating over obstacles    High Level Balance Comments 6"toe taps with SPC and then while standing on the firm airex, ball toss solid surface and then on dynamic surface      Knee/Hip Exercises: Aerobic   Recumbent Bike x 6 minutes    Nustep level 5 x 6 minutes      Knee/Hip Exercises: Machines for Strengthening   Cybex Knee Extension 10# 2x10    Cybex Knee Flexion 25# 3x10                    PT Short Term Goals - 02/11/20 1012      PT SHORT TERM GOAL #1   Title independent with initial HEP    Status Partially Met             PT Long Term Goals - 05/02/20 1252      PT LONG TERM GOAL #1   Title decrease TUG  to 15 seconds    Status Achieved      PT LONG TERM GOAL #2   Title increase Berg to 42/56    Status Partially Met      PT LONG TERM GOAL #3   Title increase LE stregnth to 4/5    Status Achieved      PT LONG TERM GOAL #4   Title understand fall risks at home    Status Achieved  Plan - 05/02/20 1250    Clinical Impression Statement Patient TUG is 13 secons, his Berg balance test score is improved, this should help with decreased risk for falls, I did educate him some more on the risk for falls and the need to take his time and slow down,  He reports that he will try to ride his bike and may try to get in a pool, I gave information on how to be safe and do some water walking    PT Next Visit Plan hold and then d/c as goals met    Consulted and Agree with Plan of Care Patient           Patient will benefit from skilled therapeutic intervention in order to improve the following deficits and impairments:  Abnormal gait, Decreased range of motion, Difficulty walking, Decreased endurance, Decreased activity tolerance, Improper body mechanics, Impaired flexibility, Decreased balance, Decreased mobility, Decreased strength, Postural dysfunction  Visit Diagnosis: Repeated falls  Muscle weakness (generalized)  Other abnormalities of gait and mobility     Problem List Patient Active Problem List   Diagnosis Date Noted  . Obstructive sleep apnea on CPAP 10/05/2018  . Aspiration pneumonia due to gastric secretions ([place])   . Hypervolemia   . Hypokalemia 03/15/2018  . Hypomagnesemia 03/15/2018  . Bladder mass 03/15/2018  . Small bowel obstruction ([place]) 03/11/2018  . Diabetes mellitus ([place]) 03/11/2018  . GERD (gastroesophageal reflux disease) 03/11/2018  . Esophageal stricture 03/11/2018  . SBO (small bowel obstruction) ([place])   . Symptomatic sinus bradycardia   . Paroxysmal atrial fibrillation ([place]) 02/14/2017  . Essential hypertension 02/14/2017  . CAD  (coronary artery disease) 07/10/2015  . Fatigue 07/10/2015  . PONV (postoperative nausea and vomiting) 08/09/2014  . Brow ptosis 06/08/2013  . Dermatochalasis 09/23/2012  . Diabetes mellitus type 2, diet-controlled ([place]) 09/23/2012  . After-cataract 12/26/2011  . Bilateral dry eyes 12/26/2011  . Blepharitis 10/03/2011  . Status post corneal transplant 10/03/2011  . Pseudophakia, both eyes 08/09/2006    Sumner Boast., PT 05/02/2020, 12:53 PM  [place] [address] [place], [place], [address] Phone: [phone]   Fax:  [phone]  Name: Mont Jagoda MRN: [id number] Date of Birth: Apr 15, 1931

## 2020-05-10 DIAGNOSIS — E785 Hyperlipidemia, unspecified: Secondary | ICD-10-CM | POA: Diagnosis not present

## 2020-05-10 DIAGNOSIS — I252 Old myocardial infarction: Secondary | ICD-10-CM | POA: Diagnosis not present

## 2020-05-10 DIAGNOSIS — I2510 Atherosclerotic heart disease of native coronary artery without angina pectoris: Secondary | ICD-10-CM | POA: Diagnosis not present

## 2020-05-10 DIAGNOSIS — R441 Visual hallucinations: Secondary | ICD-10-CM | POA: Diagnosis not present

## 2020-05-10 DIAGNOSIS — I25119 Atherosclerotic heart disease of native coronary artery with unspecified angina pectoris: Secondary | ICD-10-CM | POA: Diagnosis not present

## 2020-05-10 DIAGNOSIS — M7061 Trochanteric bursitis, right hip: Secondary | ICD-10-CM | POA: Diagnosis not present

## 2020-05-10 DIAGNOSIS — I209 Angina pectoris, unspecified: Secondary | ICD-10-CM | POA: Diagnosis not present

## 2020-05-10 DIAGNOSIS — I4891 Unspecified atrial fibrillation: Secondary | ICD-10-CM | POA: Diagnosis not present

## 2020-05-10 DIAGNOSIS — F05 Delirium due to known physiological condition: Secondary | ICD-10-CM | POA: Diagnosis not present

## 2020-05-10 DIAGNOSIS — E119 Type 2 diabetes mellitus without complications: Secondary | ICD-10-CM | POA: Diagnosis not present

## 2020-05-10 DIAGNOSIS — I10 Essential (primary) hypertension: Secondary | ICD-10-CM | POA: Diagnosis not present

## 2020-05-10 DIAGNOSIS — G3184 Mild cognitive impairment, so stated: Secondary | ICD-10-CM | POA: Diagnosis not present

## 2020-05-19 ENCOUNTER — Encounter: Payer: Self-pay | Admitting: Podiatry

## 2020-05-19 ENCOUNTER — Other Ambulatory Visit: Payer: Self-pay

## 2020-05-19 ENCOUNTER — Ambulatory Visit (INDEPENDENT_AMBULATORY_CARE_PROVIDER_SITE_OTHER): Payer: Medicare Other | Admitting: Podiatry

## 2020-05-19 DIAGNOSIS — D689 Coagulation defect, unspecified: Secondary | ICD-10-CM

## 2020-05-19 DIAGNOSIS — E119 Type 2 diabetes mellitus without complications: Secondary | ICD-10-CM

## 2020-05-19 DIAGNOSIS — B351 Tinea unguium: Secondary | ICD-10-CM | POA: Diagnosis not present

## 2020-05-19 DIAGNOSIS — M79676 Pain in unspecified toe(s): Secondary | ICD-10-CM | POA: Diagnosis not present

## 2020-05-19 NOTE — Progress Notes (Signed)
This patient returns to my office for at risk foot care.  This patient requires this care by a professional since this patient will be at risk due to having coagulation defect and diabetes.  Patient is taking eliquiss.  This patient is unable to cut nails himself since the patient cannot reach his nails.These nails are painful walking and wearing shoes.  This patient presents for at risk foot care today.  Patient presents to the office with his wife.  General Appearance  Alert, conversant and in no acute stress.  Vascular  Dorsalis pedis and posterior tibial  pulses are weakly palpable  bilaterally.  Capillary return is within normal limits  bilaterally. Temperature is within normal limits  bilaterally.  Neurologic  Senn-Weinstein monofilament wire test within normal limits  bilaterally. Muscle power within normal limits bilaterally.  Nails Thick disfigured discolored nails with subungual debris  from hallux to fifth toes bilaterally. No evidence of bacterial infection or drainage bilaterally.  Orthopedic  No limitations of motion  feet .  No crepitus or effusions noted.  No bony pathology or digital deformities noted.  Skin  normotropic skin with no porokeratosis noted bilaterally.  No signs of infections or ulcers noted.     Onychomycosis  Pain in right toes  Pain in left toes  Consent was obtained for treatment procedures.   Mechanical debridement of nails 1-5  bilaterally performed with a nail nipper.  Filed with dremel without incident.    Return office visit     3 months                 Told patient to return for periodic foot care and evaluation due to potential at risk complications.   Blayde Bacigalupi DPM  

## 2020-06-01 DIAGNOSIS — I252 Old myocardial infarction: Secondary | ICD-10-CM | POA: Diagnosis not present

## 2020-06-01 DIAGNOSIS — I25119 Atherosclerotic heart disease of native coronary artery with unspecified angina pectoris: Secondary | ICD-10-CM | POA: Diagnosis not present

## 2020-06-01 DIAGNOSIS — E785 Hyperlipidemia, unspecified: Secondary | ICD-10-CM | POA: Diagnosis not present

## 2020-06-01 DIAGNOSIS — E119 Type 2 diabetes mellitus without complications: Secondary | ICD-10-CM | POA: Diagnosis not present

## 2020-06-01 DIAGNOSIS — I2510 Atherosclerotic heart disease of native coronary artery without angina pectoris: Secondary | ICD-10-CM | POA: Diagnosis not present

## 2020-06-01 DIAGNOSIS — F0390 Unspecified dementia without behavioral disturbance: Secondary | ICD-10-CM | POA: Diagnosis not present

## 2020-06-01 DIAGNOSIS — I209 Angina pectoris, unspecified: Secondary | ICD-10-CM | POA: Diagnosis not present

## 2020-06-01 DIAGNOSIS — F05 Delirium due to known physiological condition: Secondary | ICD-10-CM | POA: Diagnosis not present

## 2020-06-01 DIAGNOSIS — I4891 Unspecified atrial fibrillation: Secondary | ICD-10-CM | POA: Diagnosis not present

## 2020-06-01 DIAGNOSIS — J40 Bronchitis, not specified as acute or chronic: Secondary | ICD-10-CM | POA: Diagnosis not present

## 2020-06-01 DIAGNOSIS — N400 Enlarged prostate without lower urinary tract symptoms: Secondary | ICD-10-CM | POA: Diagnosis not present

## 2020-06-01 DIAGNOSIS — I10 Essential (primary) hypertension: Secondary | ICD-10-CM | POA: Diagnosis not present

## 2020-06-05 DIAGNOSIS — C44319 Basal cell carcinoma of skin of other parts of face: Secondary | ICD-10-CM | POA: Diagnosis not present

## 2020-06-05 DIAGNOSIS — C4442 Squamous cell carcinoma of skin of scalp and neck: Secondary | ICD-10-CM | POA: Diagnosis not present

## 2020-06-05 DIAGNOSIS — Z85828 Personal history of other malignant neoplasm of skin: Secondary | ICD-10-CM | POA: Diagnosis not present

## 2020-06-05 DIAGNOSIS — L304 Erythema intertrigo: Secondary | ICD-10-CM | POA: Diagnosis not present

## 2020-06-05 DIAGNOSIS — L821 Other seborrheic keratosis: Secondary | ICD-10-CM | POA: Diagnosis not present

## 2020-06-05 DIAGNOSIS — L570 Actinic keratosis: Secondary | ICD-10-CM | POA: Diagnosis not present

## 2020-06-05 DIAGNOSIS — C4441 Basal cell carcinoma of skin of scalp and neck: Secondary | ICD-10-CM | POA: Diagnosis not present

## 2020-06-05 DIAGNOSIS — D485 Neoplasm of uncertain behavior of skin: Secondary | ICD-10-CM | POA: Diagnosis not present

## 2020-06-07 DIAGNOSIS — Z23 Encounter for immunization: Secondary | ICD-10-CM | POA: Diagnosis not present

## 2020-06-08 ENCOUNTER — Other Ambulatory Visit: Payer: Self-pay

## 2020-06-08 DIAGNOSIS — I10 Essential (primary) hypertension: Secondary | ICD-10-CM

## 2020-06-08 DIAGNOSIS — I2510 Atherosclerotic heart disease of native coronary artery without angina pectoris: Secondary | ICD-10-CM

## 2020-06-08 MED ORDER — APIXABAN 5 MG PO TABS: 5.0000 mg | ORAL_TABLET | Freq: Two times a day (BID) | ORAL | 5 refills | Status: DC

## 2020-06-08 NOTE — Telephone Encounter (Signed)
Pt last saw Dr Acie Fredrickson 11/10/19, last labs 11/10/19 Creat 1.04, age 86, weight 77.6kg, based on specified criteria pt is on appropriate dosage of Eliquis 5mg  BID.  Will refill rx.

## 2020-06-28 DIAGNOSIS — I2510 Atherosclerotic heart disease of native coronary artery without angina pectoris: Secondary | ICD-10-CM | POA: Diagnosis not present

## 2020-06-28 DIAGNOSIS — L89151 Pressure ulcer of sacral region, stage 1: Secondary | ICD-10-CM | POA: Diagnosis not present

## 2020-06-28 DIAGNOSIS — F05 Delirium due to known physiological condition: Secondary | ICD-10-CM | POA: Insufficient documentation

## 2020-06-28 DIAGNOSIS — I4891 Unspecified atrial fibrillation: Secondary | ICD-10-CM | POA: Diagnosis not present

## 2020-06-28 DIAGNOSIS — I252 Old myocardial infarction: Secondary | ICD-10-CM | POA: Diagnosis not present

## 2020-06-28 DIAGNOSIS — E119 Type 2 diabetes mellitus without complications: Secondary | ICD-10-CM | POA: Diagnosis not present

## 2020-06-28 DIAGNOSIS — D6869 Other thrombophilia: Secondary | ICD-10-CM | POA: Diagnosis not present

## 2020-06-28 DIAGNOSIS — I10 Essential (primary) hypertension: Secondary | ICD-10-CM | POA: Diagnosis not present

## 2020-07-03 DIAGNOSIS — N400 Enlarged prostate without lower urinary tract symptoms: Secondary | ICD-10-CM | POA: Diagnosis not present

## 2020-07-03 DIAGNOSIS — F05 Delirium due to known physiological condition: Secondary | ICD-10-CM | POA: Diagnosis not present

## 2020-07-03 DIAGNOSIS — E785 Hyperlipidemia, unspecified: Secondary | ICD-10-CM | POA: Diagnosis not present

## 2020-07-03 DIAGNOSIS — E119 Type 2 diabetes mellitus without complications: Secondary | ICD-10-CM | POA: Diagnosis not present

## 2020-07-03 DIAGNOSIS — I4891 Unspecified atrial fibrillation: Secondary | ICD-10-CM | POA: Diagnosis not present

## 2020-07-03 DIAGNOSIS — I209 Angina pectoris, unspecified: Secondary | ICD-10-CM | POA: Diagnosis not present

## 2020-07-03 DIAGNOSIS — J40 Bronchitis, not specified as acute or chronic: Secondary | ICD-10-CM | POA: Diagnosis not present

## 2020-07-03 DIAGNOSIS — I10 Essential (primary) hypertension: Secondary | ICD-10-CM | POA: Diagnosis not present

## 2020-07-03 DIAGNOSIS — I25119 Atherosclerotic heart disease of native coronary artery with unspecified angina pectoris: Secondary | ICD-10-CM | POA: Diagnosis not present

## 2020-07-03 DIAGNOSIS — I2510 Atherosclerotic heart disease of native coronary artery without angina pectoris: Secondary | ICD-10-CM | POA: Diagnosis not present

## 2020-07-03 DIAGNOSIS — I252 Old myocardial infarction: Secondary | ICD-10-CM | POA: Diagnosis not present

## 2020-07-03 DIAGNOSIS — F0390 Unspecified dementia without behavioral disturbance: Secondary | ICD-10-CM | POA: Diagnosis not present

## 2020-07-10 DIAGNOSIS — F0390 Unspecified dementia without behavioral disturbance: Secondary | ICD-10-CM | POA: Insufficient documentation

## 2020-07-13 ENCOUNTER — Ambulatory Visit (INDEPENDENT_AMBULATORY_CARE_PROVIDER_SITE_OTHER): Payer: Medicare Other | Admitting: Family Medicine

## 2020-07-13 ENCOUNTER — Encounter: Payer: Self-pay | Admitting: Family Medicine

## 2020-07-13 ENCOUNTER — Other Ambulatory Visit: Payer: Self-pay

## 2020-07-13 VITALS — BP 124/68 | HR 59 | Ht 66.0 in | Wt 164.0 lb

## 2020-07-13 DIAGNOSIS — R413 Other amnesia: Secondary | ICD-10-CM | POA: Diagnosis not present

## 2020-07-13 DIAGNOSIS — R2681 Unsteadiness on feet: Secondary | ICD-10-CM

## 2020-07-13 DIAGNOSIS — F0390 Unspecified dementia without behavioral disturbance: Secondary | ICD-10-CM | POA: Diagnosis not present

## 2020-07-13 DIAGNOSIS — R443 Hallucinations, unspecified: Secondary | ICD-10-CM | POA: Diagnosis not present

## 2020-07-13 DIAGNOSIS — I6789 Other cerebrovascular disease: Secondary | ICD-10-CM
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
## 2020-07-13 NOTE — Patient Instructions (Signed)
We will get notes from Dr Inda Merlin. I will discuss case with Dr Rexene Alberts. You will hear back from me at the beginning of the week with a game plan. Please continue close follow up with Dr Inda Merlin.    Memory Compensation Strategies  1. Use "WARM" strategy.  W= write it down  A= associate it  R= repeat it  M= make a mental note  2.   You can keep a Social worker.  Use a 3-ring notebook with sections for the following: calendar, important names and phone numbers,  medications, doctors' names/phone numbers, lists/reminders, and a section to journal what you did  each day.   3.    Use a calendar to write appointments down.  4.    Write yourself a schedule for the day.  This can be placed on the calendar or in a separate section of the Memory Notebook.  Keeping a  regular schedule can help memory.  5.    Use medication organizer with sections for each day or morning/evening pills.  You may need help loading it  6.    Keep a basket, or pegboard by the door.  Place items that you need to take out with you in the basket or on the pegboard.  You may also want to  include a message board for reminders.  7.    Use sticky notes.  Place sticky notes with reminders in a place where the task is performed.  For example: " turn off the  stove" placed by the stove, "lock the door" placed on the door at eye level, " take your medications" on  the bathroom mirror or by the place where you normally take your medications.  8.    Use alarms/timers.  Use while cooking to remind yourself to check on food or as a reminder to take your medicine, or as a  reminder to make a call, or as a reminder to perform another task, etc.   Dementia Caregiver Guide Dementia is a term used to describe a number of symptoms that affect memory and thinking. The most common symptoms include:  Memory loss.  Trouble with language and communication.  Trouble concentrating.  Poor judgment.  Problems with  reasoning.  Child-like behavior and language.  Extreme anxiety.  Angry outbursts.  Wandering from home or public places. Dementia usually gets worse slowly over time. In the early stages, people with dementia can stay independent and safe with some help. In later stages, they need help with daily tasks such as dressing, grooming, and using the bathroom. How to help the person with dementia cope Dementia can be frightening and confusing. Here are some tips to help the person with dementia cope with changes caused by the disease. General tips  Keep the person on track with his or her routine.  Try to identify areas where the person may need help.  Be supportive, patient, calm, and encouraging.  Gently remind the person that adjusting to changes takes time.  Help with the tasks that the person has asked for help with.  Keep the person involved in daily tasks and decisions as much as possible.  Encourage conversation, but try not to get frustrated or harried if the person struggles to find words or does not seem to appreciate your help. Communication tips  When the person is talking or seems frustrated, make eye contact and hold the person's hand.  Ask specific questions that need yes or no answers.  Use simple words, short  sentences, and a calm voice. Only give one direction at a time.  When offering choices, limit them to just 1 or 2.  Avoid correcting the person in a negative way.  If the person is struggling to find the right words, gently try to help him or her. How to recognize symptoms of stress Symptoms of stress in caregivers include:  Feeling frustrated or angry with the person with dementia.  Denying that the person has dementia or that his or her symptoms will not improve.  Feeling hopeless and unappreciated.  Difficulty sleeping.  Difficulty concentrating.  Feeling anxious, irritable, or depressed.  Developing stress-related health problems.  Feeling  like you have too little time for your own life. Follow these instructions at home:   Make sure that you and the person you are caring for: ? Get regular sleep. ? Exercise regularly. ? Eat regular, nutritious meals. ? Drink enough fluid to keep your urine clear or pale yellow. ? Take over-the-counter and prescription medicines only as told by your health care providers. ? Attend all scheduled health care appointments.  Join a support group with others who are caregivers.  Ask about respite care resources so that you can have a regular break from the stress of caregiving.  Look for signs of stress in yourself and in the person you are caring for. If you notice signs of stress, take steps to manage it.  Consider any safety risks and take steps to avoid them.  Organize medications in a pill box for each day of the week.  Create a plan to handle any legal or financial matters. Get legal or financial advice if needed.  Keep a calendar in a central location to remind the person of appointments or other activities. Tips for reducing the risk of injury  Keep floors clear of clutter. Remove rugs, magazine racks, and floor lamps.  Keep hallways well lit, especially at night.  Put a handrail and nonslip mat in the bathtub or shower.  Put childproof locks on cabinets that contain dangerous items, such as medicines, alcohol, guns, toxic cleaning items, sharp tools or utensils, matches, and lighters.  Put the locks in places where the person cannot see or reach them easily. This will help ensure that the person does not wander out of the house and get lost.  Be prepared for emergencies. Keep a list of emergency phone numbers and addresses in a convenient area.  Remove car keys and lock garage doors so that the person does not try to get in the car and drive.  Have the person wear a bracelet that tracks locations and identifies the person as having memory problems. This should be worn at  all times for safety. Where to find support: Many individuals and organizations offer support. These include:  Support groups for people with dementia and for caregivers.  Counselors or therapists.  Home health care services.  Adult day care centers. Where to find more information [employer]: [employer] Contact a health care provider if:  The person's health is rapidly getting worse.  You are no longer able to care for the person.  Caring for the person is affecting your physical and emotional health.  The person threatens himself or herself, you, or anyone else. Summary  Dementia is a term used to describe a number of symptoms that affect memory and thinking.  Dementia usually gets worse slowly over time.  Take steps to reduce the person's risk of injury, and to plan for future care.  Caregivers need support, relief from caregiving, and time for their own lives. This information is not intended to replace advice given to you by your health care provider. Make sure you discuss any questions you have with your health care provider. Document Revised: 09/12/2017 Document Reviewed: 09/03/2016 Elsevier Patient Education  2020 [employer].

## 2020-07-19 ENCOUNTER — Encounter: Payer: Self-pay | Admitting: Family Medicine

## 2020-07-19 MED ORDER — DONEPEZIL HCL 10 MG PO TABS: 10.0000 mg | ORAL_TABLET | Freq: Every day | ORAL | 11 refills | Status: DC

## 2020-07-24 DIAGNOSIS — J40 Bronchitis, not specified as acute or chronic: Secondary | ICD-10-CM | POA: Diagnosis not present

## 2020-07-24 DIAGNOSIS — N400 Enlarged prostate without lower urinary tract symptoms: Secondary | ICD-10-CM | POA: Diagnosis not present

## 2020-07-24 DIAGNOSIS — I2510 Atherosclerotic heart disease of native coronary artery without angina pectoris: Secondary | ICD-10-CM | POA: Diagnosis not present

## 2020-07-24 DIAGNOSIS — E119 Type 2 diabetes mellitus without complications: Secondary | ICD-10-CM | POA: Insufficient documentation

## 2020-07-24 DIAGNOSIS — E785 Hyperlipidemia, unspecified: Secondary | ICD-10-CM | POA: Diagnosis not present

## 2020-07-24 DIAGNOSIS — F0390 Unspecified dementia without behavioral disturbance: Secondary | ICD-10-CM | POA: Diagnosis not present

## 2020-07-24 DIAGNOSIS — I25119 Atherosclerotic heart disease of native coronary artery with unspecified angina pectoris: Secondary | ICD-10-CM | POA: Diagnosis not present

## 2020-07-24 DIAGNOSIS — I10 Essential (primary) hypertension: Secondary | ICD-10-CM | POA: Diagnosis not present

## 2020-07-24 DIAGNOSIS — I4891 Unspecified atrial fibrillation: Secondary | ICD-10-CM | POA: Diagnosis not present

## 2020-07-25 ENCOUNTER — Telehealth: Payer: Self-pay | Admitting: *Deleted

## 2020-07-25 NOTE — Telephone Encounter (Signed)
R/c notes from Dr Inda Merlin. Notes on  [place] desk

## 2020-07-26 DIAGNOSIS — D6869 Other thrombophilia: Secondary | ICD-10-CM | POA: Diagnosis not present

## 2020-07-26 DIAGNOSIS — E119 Type 2 diabetes mellitus without complications: Secondary | ICD-10-CM | POA: Diagnosis not present

## 2020-07-26 DIAGNOSIS — Z7901 Long term (current) use of anticoagulants: Secondary | ICD-10-CM | POA: Diagnosis not present

## 2020-07-26 DIAGNOSIS — F0390 Unspecified dementia without behavioral disturbance: Secondary | ICD-10-CM | POA: Diagnosis not present

## 2020-07-26 DIAGNOSIS — I252 Old myocardial infarction: Secondary | ICD-10-CM | POA: Diagnosis not present

## 2020-07-26 DIAGNOSIS — F05 Delirium due to known physiological condition: Secondary | ICD-10-CM | POA: Diagnosis not present

## 2020-07-26 DIAGNOSIS — I2510 Atherosclerotic heart disease of native coronary artery without angina pectoris: Secondary | ICD-10-CM | POA: Diagnosis not present

## 2020-07-26 DIAGNOSIS — I4891 Unspecified atrial fibrillation: Secondary | ICD-10-CM | POA: Diagnosis not present

## 2020-07-26 DIAGNOSIS — G4733 Obstructive sleep apnea (adult) (pediatric): Secondary | ICD-10-CM | POA: Diagnosis not present

## 2020-07-26 DIAGNOSIS — I10 Essential (primary) hypertension: Secondary | ICD-10-CM | POA: Diagnosis not present

## 2020-07-26 DIAGNOSIS — M109 Gout, unspecified: Secondary | ICD-10-CM | POA: Diagnosis not present

## 2020-07-28 DIAGNOSIS — F0390 Unspecified dementia without behavioral disturbance: Secondary | ICD-10-CM | POA: Diagnosis not present

## 2020-07-28 DIAGNOSIS — I4891 Unspecified atrial fibrillation: Secondary | ICD-10-CM | POA: Diagnosis not present

## 2020-07-28 DIAGNOSIS — E119 Type 2 diabetes mellitus without complications: Secondary | ICD-10-CM | POA: Diagnosis not present

## 2020-07-28 DIAGNOSIS — F05 Delirium due to known physiological condition: Secondary | ICD-10-CM | POA: Diagnosis not present

## 2020-07-28 DIAGNOSIS — I10 Essential (primary) hypertension: Secondary | ICD-10-CM | POA: Diagnosis not present

## 2020-07-28 DIAGNOSIS — I2510 Atherosclerotic heart disease of native coronary artery without angina pectoris: Secondary | ICD-10-CM | POA: Diagnosis not present

## 2020-08-01 DIAGNOSIS — F0390 Unspecified dementia without behavioral disturbance: Secondary | ICD-10-CM | POA: Diagnosis not present

## 2020-08-01 DIAGNOSIS — I2510 Atherosclerotic heart disease of native coronary artery without angina pectoris: Secondary | ICD-10-CM | POA: Diagnosis not present

## 2020-08-01 DIAGNOSIS — I4891 Unspecified atrial fibrillation: Secondary | ICD-10-CM | POA: Diagnosis not present

## 2020-08-01 DIAGNOSIS — E119 Type 2 diabetes mellitus without complications: Secondary | ICD-10-CM | POA: Diagnosis not present

## 2020-08-01 DIAGNOSIS — F05 Delirium due to known physiological condition: Secondary | ICD-10-CM | POA: Diagnosis not present

## 2020-08-01 DIAGNOSIS — I10 Essential (primary) hypertension: Secondary | ICD-10-CM | POA: Diagnosis not present

## 2020-08-02 DIAGNOSIS — F0390 Unspecified dementia without behavioral disturbance: Secondary | ICD-10-CM | POA: Diagnosis not present

## 2020-08-02 DIAGNOSIS — I4891 Unspecified atrial fibrillation: Secondary | ICD-10-CM | POA: Diagnosis not present

## 2020-08-02 DIAGNOSIS — I10 Essential (primary) hypertension: Secondary | ICD-10-CM | POA: Diagnosis not present

## 2020-08-02 DIAGNOSIS — E119 Type 2 diabetes mellitus without complications: Secondary | ICD-10-CM | POA: Diagnosis not present

## 2020-08-02 DIAGNOSIS — I2510 Atherosclerotic heart disease of native coronary artery without angina pectoris: Secondary | ICD-10-CM | POA: Diagnosis not present

## 2020-08-02 DIAGNOSIS — F05 Delirium due to known physiological condition: Secondary | ICD-10-CM | POA: Diagnosis not present

## 2020-08-04 DIAGNOSIS — I4891 Unspecified atrial fibrillation: Secondary | ICD-10-CM | POA: Diagnosis not present

## 2020-08-04 DIAGNOSIS — I10 Essential (primary) hypertension: Secondary | ICD-10-CM | POA: Diagnosis not present

## 2020-08-04 DIAGNOSIS — F05 Delirium due to known physiological condition: Secondary | ICD-10-CM | POA: Diagnosis not present

## 2020-08-04 DIAGNOSIS — F0390 Unspecified dementia without behavioral disturbance: Secondary | ICD-10-CM | POA: Diagnosis not present

## 2020-08-04 DIAGNOSIS — I2510 Atherosclerotic heart disease of native coronary artery without angina pectoris: Secondary | ICD-10-CM | POA: Diagnosis not present

## 2020-08-04 DIAGNOSIS — E119 Type 2 diabetes mellitus without complications: Secondary | ICD-10-CM | POA: Diagnosis not present

## 2020-08-08 DIAGNOSIS — E119 Type 2 diabetes mellitus without complications: Secondary | ICD-10-CM | POA: Diagnosis not present

## 2020-08-08 DIAGNOSIS — I10 Essential (primary) hypertension: Secondary | ICD-10-CM | POA: Diagnosis not present

## 2020-08-08 DIAGNOSIS — F0390 Unspecified dementia without behavioral disturbance: Secondary | ICD-10-CM | POA: Diagnosis not present

## 2020-08-08 DIAGNOSIS — I4891 Unspecified atrial fibrillation: Secondary | ICD-10-CM | POA: Diagnosis not present

## 2020-08-08 DIAGNOSIS — I2510 Atherosclerotic heart disease of native coronary artery without angina pectoris: Secondary | ICD-10-CM | POA: Diagnosis not present

## 2020-08-08 DIAGNOSIS — F05 Delirium due to known physiological condition: Secondary | ICD-10-CM | POA: Diagnosis not present

## 2020-08-09 DIAGNOSIS — I4891 Unspecified atrial fibrillation: Secondary | ICD-10-CM | POA: Diagnosis not present

## 2020-08-09 DIAGNOSIS — F05 Delirium due to known physiological condition: Secondary | ICD-10-CM | POA: Diagnosis not present

## 2020-08-09 DIAGNOSIS — I10 Essential (primary) hypertension: Secondary | ICD-10-CM | POA: Diagnosis not present

## 2020-08-09 DIAGNOSIS — I2510 Atherosclerotic heart disease of native coronary artery without angina pectoris: Secondary | ICD-10-CM | POA: Diagnosis not present

## 2020-08-09 DIAGNOSIS — F0390 Unspecified dementia without behavioral disturbance: Secondary | ICD-10-CM | POA: Diagnosis not present

## 2020-08-09 DIAGNOSIS — E119 Type 2 diabetes mellitus without complications: Secondary | ICD-10-CM | POA: Diagnosis not present

## 2020-08-10 DIAGNOSIS — F05 Delirium due to known physiological condition: Secondary | ICD-10-CM | POA: Diagnosis not present

## 2020-08-10 DIAGNOSIS — E119 Type 2 diabetes mellitus without complications: Secondary | ICD-10-CM | POA: Diagnosis not present

## 2020-08-10 DIAGNOSIS — I10 Essential (primary) hypertension: Secondary | ICD-10-CM | POA: Diagnosis not present

## 2020-08-10 DIAGNOSIS — I4891 Unspecified atrial fibrillation: Secondary | ICD-10-CM | POA: Diagnosis not present

## 2020-08-10 DIAGNOSIS — I2510 Atherosclerotic heart disease of native coronary artery without angina pectoris: Secondary | ICD-10-CM | POA: Diagnosis not present

## 2020-08-10 DIAGNOSIS — F0390 Unspecified dementia without behavioral disturbance: Secondary | ICD-10-CM | POA: Diagnosis not present

## 2020-08-14 DIAGNOSIS — I4891 Unspecified atrial fibrillation: Secondary | ICD-10-CM | POA: Diagnosis not present

## 2020-08-14 DIAGNOSIS — I10 Essential (primary) hypertension: Secondary | ICD-10-CM | POA: Diagnosis not present

## 2020-08-14 DIAGNOSIS — I2510 Atherosclerotic heart disease of native coronary artery without angina pectoris: Secondary | ICD-10-CM | POA: Diagnosis not present

## 2020-08-14 DIAGNOSIS — F05 Delirium due to known physiological condition: Secondary | ICD-10-CM | POA: Diagnosis not present

## 2020-08-14 DIAGNOSIS — F0390 Unspecified dementia without behavioral disturbance: Secondary | ICD-10-CM | POA: Diagnosis not present

## 2020-08-14 DIAGNOSIS — E119 Type 2 diabetes mellitus without complications: Secondary | ICD-10-CM | POA: Diagnosis not present

## 2020-08-16 DIAGNOSIS — E119 Type 2 diabetes mellitus without complications: Secondary | ICD-10-CM | POA: Diagnosis not present

## 2020-08-16 DIAGNOSIS — I2510 Atherosclerotic heart disease of native coronary artery without angina pectoris: Secondary | ICD-10-CM | POA: Diagnosis not present

## 2020-08-16 DIAGNOSIS — I10 Essential (primary) hypertension: Secondary | ICD-10-CM | POA: Diagnosis not present

## 2020-08-16 DIAGNOSIS — F0390 Unspecified dementia without behavioral disturbance: Secondary | ICD-10-CM | POA: Diagnosis not present

## 2020-08-16 DIAGNOSIS — I4891 Unspecified atrial fibrillation: Secondary | ICD-10-CM | POA: Diagnosis not present

## 2020-08-16 DIAGNOSIS — F05 Delirium due to known physiological condition: Secondary | ICD-10-CM | POA: Diagnosis not present

## 2020-08-21 DIAGNOSIS — E119 Type 2 diabetes mellitus without complications: Secondary | ICD-10-CM | POA: Diagnosis not present

## 2020-08-21 DIAGNOSIS — I4891 Unspecified atrial fibrillation: Secondary | ICD-10-CM | POA: Diagnosis not present

## 2020-08-21 DIAGNOSIS — I10 Essential (primary) hypertension: Secondary | ICD-10-CM | POA: Diagnosis not present

## 2020-08-21 DIAGNOSIS — F0390 Unspecified dementia without behavioral disturbance: Secondary | ICD-10-CM | POA: Diagnosis not present

## 2020-08-21 DIAGNOSIS — F05 Delirium due to known physiological condition: Secondary | ICD-10-CM | POA: Diagnosis not present

## 2020-08-21 DIAGNOSIS — I2510 Atherosclerotic heart disease of native coronary artery without angina pectoris: Secondary | ICD-10-CM | POA: Diagnosis not present

## 2020-08-22 ENCOUNTER — Other Ambulatory Visit: Payer: Self-pay | Admitting: *Deleted

## 2020-08-22 ENCOUNTER — Telehealth: Payer: Self-pay | Admitting: Family Medicine

## 2020-08-22 DIAGNOSIS — I2510 Atherosclerotic heart disease of native coronary artery without angina pectoris: Secondary | ICD-10-CM | POA: Diagnosis not present

## 2020-08-22 DIAGNOSIS — F0390 Unspecified dementia without behavioral disturbance: Secondary | ICD-10-CM | POA: Diagnosis not present

## 2020-08-22 DIAGNOSIS — I10 Essential (primary) hypertension: Secondary | ICD-10-CM | POA: Diagnosis not present

## 2020-08-22 DIAGNOSIS — F05 Delirium due to known physiological condition: Secondary | ICD-10-CM | POA: Diagnosis not present

## 2020-08-22 DIAGNOSIS — I4891 Unspecified atrial fibrillation: Secondary | ICD-10-CM | POA: Diagnosis not present

## 2020-08-22 DIAGNOSIS — E119 Type 2 diabetes mellitus without complications: Secondary | ICD-10-CM | POA: Diagnosis not present

## 2020-08-22 MED ORDER — DONEPEZIL HCL 10 MG PO TABS
10.0000 mg | ORAL_TABLET | Freq: Every day | ORAL | 3 refills | Status: DC
Start: 2020-08-22 — End: 2021-09-03

## 2020-08-22 NOTE — Telephone Encounter (Signed)
Called, LVM for Jonathan [name] at Dr. Inda Merlin office. Relayed AL,NP message below. Advised her to call back if she has any further questions/concerns.

## 2020-08-22 NOTE — Telephone Encounter (Signed)
Dr. Inda Merlin office [facility]) called, If Aricept increased to 10 mg would need a 90 -day supply prescription sent to [facility]

## 2020-08-22 NOTE — Telephone Encounter (Signed)
E-scribed rx as requested. 

## 2020-08-22 NOTE — Telephone Encounter (Signed)
Amy, I see where aricept was increased to 10mg  but was risperdal d/c'd?

## 2020-08-22 NOTE — Telephone Encounter (Signed)
Yes, our plan was to increase Aricept to 10mg  daily. Patient and his wife was advised to discuss risperidone with Dr Inda Merlin as he was prescribing. Dr Rexene Alberts felt that parkinsonism symptoms could be worsened on this medication and said to follow up with Dr Inda Merlin. Please let me know if they need me!

## 2020-08-22 NOTE — Telephone Encounter (Signed)
Kaitlyn From [facility] Dr. Josetta Huddle office called wanting to confirm changes in the pt's Medications. Pt informed her that there was an increase on his donepezil (ARICEPT) 10 MG tablet to the 10mg  and that he was to discontinue his risperiDONE (RISPERDAL) 1 MG tablet Please call back to confirm changes.

## 2020-08-23 ENCOUNTER — Other Ambulatory Visit: Payer: Self-pay

## 2020-08-23 ENCOUNTER — Encounter: Payer: Self-pay | Admitting: Podiatry

## 2020-08-23 ENCOUNTER — Ambulatory Visit (INDEPENDENT_AMBULATORY_CARE_PROVIDER_SITE_OTHER): Payer: Medicare Other | Admitting: Podiatry

## 2020-08-23 DIAGNOSIS — B351 Tinea unguium: Secondary | ICD-10-CM | POA: Diagnosis not present

## 2020-08-23 DIAGNOSIS — M79676 Pain in unspecified toe(s): Secondary | ICD-10-CM

## 2020-08-23 DIAGNOSIS — D689 Coagulation defect, unspecified: Secondary | ICD-10-CM

## 2020-08-23 DIAGNOSIS — F0390 Unspecified dementia without behavioral disturbance: Secondary | ICD-10-CM | POA: Diagnosis not present

## 2020-08-23 DIAGNOSIS — F05 Delirium due to known physiological condition: Secondary | ICD-10-CM | POA: Diagnosis not present

## 2020-08-23 DIAGNOSIS — I2510 Atherosclerotic heart disease of native coronary artery without angina pectoris: Secondary | ICD-10-CM | POA: Diagnosis not present

## 2020-08-23 DIAGNOSIS — I10 Essential (primary) hypertension: Secondary | ICD-10-CM | POA: Diagnosis not present

## 2020-08-23 DIAGNOSIS — I4891 Unspecified atrial fibrillation: Secondary | ICD-10-CM | POA: Diagnosis not present

## 2020-08-23 DIAGNOSIS — E119 Type 2 diabetes mellitus without complications: Secondary | ICD-10-CM | POA: Diagnosis not present

## 2020-08-23 NOTE — Progress Notes (Signed)
This patient returns to my office for at risk foot care.  This patient requires this care by a professional since this patient will be at risk due to having coagulation defect and diabetes.  Patient is taking eliquiss.  This patient is unable to cut nails himself since the patient cannot reach his nails.These nails are painful walking and wearing shoes.  This patient presents for at risk foot care today.  Patient presents to the office with his wife.  General Appearance  Alert, conversant and in no acute stress.  Vascular  Dorsalis pedis and posterior tibial  pulses are weakly palpable  bilaterally.  Capillary return is within normal limits  bilaterally. Temperature is within normal limits  bilaterally.  Neurologic  Senn-Weinstein monofilament wire test within normal limits  bilaterally. Muscle power within normal limits bilaterally.  Nails Thick disfigured discolored nails with subungual debris  from hallux to fifth toes bilaterally. No evidence of bacterial infection or drainage bilaterally.  Orthopedic  No limitations of motion  feet .  No crepitus or effusions noted.  No bony pathology or digital deformities noted.  Skin  normotropic skin with no porokeratosis noted bilaterally.  No signs of infections or ulcers noted.     Onychomycosis  Pain in right toes  Pain in left toes  Consent was obtained for treatment procedures.   Mechanical debridement of nails 1-5  bilaterally performed with a nail nipper.  Filed with dremel without incident.    Return office visit     3 months                 Told patient to return for periodic foot care and evaluation due to potential at risk complications.   Anacarolina Evelyn DPM  

## 2020-08-25 DIAGNOSIS — Z7901 Long term (current) use of anticoagulants: Secondary | ICD-10-CM | POA: Diagnosis not present

## 2020-08-25 DIAGNOSIS — F05 Delirium due to known physiological condition: Secondary | ICD-10-CM | POA: Diagnosis not present

## 2020-08-25 DIAGNOSIS — M109 Gout, unspecified: Secondary | ICD-10-CM | POA: Diagnosis not present

## 2020-08-25 DIAGNOSIS — I2510 Atherosclerotic heart disease of native coronary artery without angina pectoris: Secondary | ICD-10-CM | POA: Diagnosis not present

## 2020-08-25 DIAGNOSIS — D6869 Other thrombophilia: Secondary | ICD-10-CM | POA: Diagnosis not present

## 2020-08-25 DIAGNOSIS — E119 Type 2 diabetes mellitus without complications: Secondary | ICD-10-CM | POA: Diagnosis not present

## 2020-08-25 DIAGNOSIS — F0390 Unspecified dementia without behavioral disturbance: Secondary | ICD-10-CM | POA: Diagnosis not present

## 2020-08-25 DIAGNOSIS — I10 Essential (primary) hypertension: Secondary | ICD-10-CM | POA: Diagnosis not present

## 2020-08-25 DIAGNOSIS — I252 Old myocardial infarction: Secondary | ICD-10-CM | POA: Diagnosis not present

## 2020-08-25 DIAGNOSIS — G4733 Obstructive sleep apnea (adult) (pediatric): Secondary | ICD-10-CM | POA: Diagnosis not present

## 2020-08-25 DIAGNOSIS — I4891 Unspecified atrial fibrillation: Secondary | ICD-10-CM | POA: Diagnosis not present

## 2020-08-29 DIAGNOSIS — F05 Delirium due to known physiological condition: Secondary | ICD-10-CM | POA: Diagnosis not present

## 2020-08-29 DIAGNOSIS — F0390 Unspecified dementia without behavioral disturbance: Secondary | ICD-10-CM | POA: Diagnosis not present

## 2020-08-29 DIAGNOSIS — E119 Type 2 diabetes mellitus without complications: Secondary | ICD-10-CM | POA: Diagnosis not present

## 2020-08-29 DIAGNOSIS — I10 Essential (primary) hypertension: Secondary | ICD-10-CM | POA: Diagnosis not present

## 2020-08-29 DIAGNOSIS — I2510 Atherosclerotic heart disease of native coronary artery without angina pectoris: Secondary | ICD-10-CM | POA: Diagnosis not present

## 2020-08-29 DIAGNOSIS — I4891 Unspecified atrial fibrillation: Secondary | ICD-10-CM | POA: Diagnosis not present

## 2020-08-30 DIAGNOSIS — I10 Essential (primary) hypertension: Secondary | ICD-10-CM | POA: Diagnosis not present

## 2020-08-30 DIAGNOSIS — F0390 Unspecified dementia without behavioral disturbance: Secondary | ICD-10-CM | POA: Diagnosis not present

## 2020-08-30 DIAGNOSIS — I4891 Unspecified atrial fibrillation: Secondary | ICD-10-CM | POA: Diagnosis not present

## 2020-08-30 DIAGNOSIS — E119 Type 2 diabetes mellitus without complications: Secondary | ICD-10-CM | POA: Diagnosis not present

## 2020-08-30 DIAGNOSIS — I2510 Atherosclerotic heart disease of native coronary artery without angina pectoris: Secondary | ICD-10-CM | POA: Diagnosis not present

## 2020-08-30 DIAGNOSIS — F05 Delirium due to known physiological condition: Secondary | ICD-10-CM | POA: Diagnosis not present

## 2020-08-31 DIAGNOSIS — I10 Essential (primary) hypertension: Secondary | ICD-10-CM | POA: Diagnosis not present

## 2020-08-31 DIAGNOSIS — I2510 Atherosclerotic heart disease of native coronary artery without angina pectoris: Secondary | ICD-10-CM | POA: Diagnosis not present

## 2020-08-31 DIAGNOSIS — I4891 Unspecified atrial fibrillation: Secondary | ICD-10-CM | POA: Diagnosis not present

## 2020-08-31 DIAGNOSIS — F0390 Unspecified dementia without behavioral disturbance: Secondary | ICD-10-CM | POA: Diagnosis not present

## 2020-08-31 DIAGNOSIS — I209 Angina pectoris, unspecified: Secondary | ICD-10-CM | POA: Diagnosis not present

## 2020-08-31 DIAGNOSIS — E785 Hyperlipidemia, unspecified: Secondary | ICD-10-CM | POA: Diagnosis not present

## 2020-08-31 DIAGNOSIS — K219 Gastro-esophageal reflux disease without esophagitis: Secondary | ICD-10-CM | POA: Diagnosis not present

## 2020-08-31 DIAGNOSIS — I25119 Atherosclerotic heart disease of native coronary artery with unspecified angina pectoris: Secondary | ICD-10-CM | POA: Diagnosis not present

## 2020-08-31 DIAGNOSIS — F05 Delirium due to known physiological condition: Secondary | ICD-10-CM | POA: Diagnosis not present

## 2020-08-31 DIAGNOSIS — J40 Bronchitis, not specified as acute or chronic: Secondary | ICD-10-CM | POA: Diagnosis not present

## 2020-08-31 DIAGNOSIS — E119 Type 2 diabetes mellitus without complications: Secondary | ICD-10-CM | POA: Diagnosis not present

## 2020-08-31 DIAGNOSIS — I252 Old myocardial infarction: Secondary | ICD-10-CM | POA: Diagnosis not present

## 2020-09-05 DIAGNOSIS — I2510 Atherosclerotic heart disease of native coronary artery without angina pectoris: Secondary | ICD-10-CM | POA: Diagnosis not present

## 2020-09-05 DIAGNOSIS — E119 Type 2 diabetes mellitus without complications: Secondary | ICD-10-CM | POA: Diagnosis not present

## 2020-09-05 DIAGNOSIS — F0390 Unspecified dementia without behavioral disturbance: Secondary | ICD-10-CM | POA: Diagnosis not present

## 2020-09-05 DIAGNOSIS — F05 Delirium due to known physiological condition: Secondary | ICD-10-CM | POA: Diagnosis not present

## 2020-09-05 DIAGNOSIS — I10 Essential (primary) hypertension: Secondary | ICD-10-CM | POA: Diagnosis not present

## 2020-09-05 DIAGNOSIS — I4891 Unspecified atrial fibrillation: Secondary | ICD-10-CM | POA: Diagnosis not present

## 2020-09-06 DIAGNOSIS — F05 Delirium due to known physiological condition: Secondary | ICD-10-CM | POA: Diagnosis not present

## 2020-09-06 DIAGNOSIS — F0390 Unspecified dementia without behavioral disturbance: Secondary | ICD-10-CM | POA: Diagnosis not present

## 2020-09-06 DIAGNOSIS — I4891 Unspecified atrial fibrillation: Secondary | ICD-10-CM | POA: Diagnosis not present

## 2020-09-06 DIAGNOSIS — I2510 Atherosclerotic heart disease of native coronary artery without angina pectoris: Secondary | ICD-10-CM | POA: Diagnosis not present

## 2020-09-06 DIAGNOSIS — E119 Type 2 diabetes mellitus without complications: Secondary | ICD-10-CM | POA: Diagnosis not present

## 2020-09-06 DIAGNOSIS — I10 Essential (primary) hypertension: Secondary | ICD-10-CM | POA: Diagnosis not present

## 2020-09-08 DIAGNOSIS — F05 Delirium due to known physiological condition: Secondary | ICD-10-CM | POA: Diagnosis not present

## 2020-09-08 DIAGNOSIS — I4891 Unspecified atrial fibrillation: Secondary | ICD-10-CM | POA: Diagnosis not present

## 2020-09-08 DIAGNOSIS — F0390 Unspecified dementia without behavioral disturbance: Secondary | ICD-10-CM | POA: Diagnosis not present

## 2020-09-08 DIAGNOSIS — I2510 Atherosclerotic heart disease of native coronary artery without angina pectoris: Secondary | ICD-10-CM | POA: Diagnosis not present

## 2020-09-08 DIAGNOSIS — E119 Type 2 diabetes mellitus without complications: Secondary | ICD-10-CM | POA: Diagnosis not present

## 2020-09-08 DIAGNOSIS — I10 Essential (primary) hypertension: Secondary | ICD-10-CM | POA: Diagnosis not present

## 2020-09-11 DIAGNOSIS — I2510 Atherosclerotic heart disease of native coronary artery without angina pectoris: Secondary | ICD-10-CM | POA: Diagnosis not present

## 2020-09-11 DIAGNOSIS — I4891 Unspecified atrial fibrillation: Secondary | ICD-10-CM | POA: Diagnosis not present

## 2020-09-11 DIAGNOSIS — I10 Essential (primary) hypertension: Secondary | ICD-10-CM | POA: Diagnosis not present

## 2020-09-11 DIAGNOSIS — E119 Type 2 diabetes mellitus without complications: Secondary | ICD-10-CM | POA: Diagnosis not present

## 2020-09-11 DIAGNOSIS — F0390 Unspecified dementia without behavioral disturbance: Secondary | ICD-10-CM | POA: Diagnosis not present

## 2020-09-11 DIAGNOSIS — F05 Delirium due to known physiological condition: Secondary | ICD-10-CM | POA: Diagnosis not present

## 2020-09-13 DIAGNOSIS — I4891 Unspecified atrial fibrillation: Secondary | ICD-10-CM | POA: Diagnosis not present

## 2020-09-13 DIAGNOSIS — I10 Essential (primary) hypertension: Secondary | ICD-10-CM | POA: Diagnosis not present

## 2020-09-13 DIAGNOSIS — E119 Type 2 diabetes mellitus without complications: Secondary | ICD-10-CM | POA: Diagnosis not present

## 2020-09-13 DIAGNOSIS — I2510 Atherosclerotic heart disease of native coronary artery without angina pectoris: Secondary | ICD-10-CM | POA: Diagnosis not present

## 2020-09-13 DIAGNOSIS — F05 Delirium due to known physiological condition: Secondary | ICD-10-CM | POA: Diagnosis not present

## 2020-09-13 DIAGNOSIS — F0390 Unspecified dementia without behavioral disturbance: Secondary | ICD-10-CM | POA: Diagnosis not present

## 2020-09-14 DIAGNOSIS — F0390 Unspecified dementia without behavioral disturbance: Secondary | ICD-10-CM | POA: Diagnosis not present

## 2020-09-14 DIAGNOSIS — R269 Unspecified abnormalities of gait and mobility: Secondary | ICD-10-CM | POA: Diagnosis not present

## 2020-09-14 DIAGNOSIS — I4891 Unspecified atrial fibrillation: Secondary | ICD-10-CM | POA: Diagnosis not present

## 2020-09-14 DIAGNOSIS — R296 Repeated falls: Secondary | ICD-10-CM | POA: Diagnosis not present

## 2020-09-14 DIAGNOSIS — I2510 Atherosclerotic heart disease of native coronary artery without angina pectoris: Secondary | ICD-10-CM | POA: Diagnosis not present

## 2020-09-14 DIAGNOSIS — Z79899 Other long term (current) drug therapy: Secondary | ICD-10-CM | POA: Diagnosis not present

## 2020-09-14 DIAGNOSIS — I10 Essential (primary) hypertension: Secondary | ICD-10-CM | POA: Diagnosis not present

## 2020-09-14 DIAGNOSIS — F05 Delirium due to known physiological condition: Secondary | ICD-10-CM | POA: Diagnosis not present

## 2020-09-14 DIAGNOSIS — Z23 Encounter for immunization: Secondary | ICD-10-CM | POA: Diagnosis not present

## 2020-09-15 DIAGNOSIS — F05 Delirium due to known physiological condition: Secondary | ICD-10-CM | POA: Diagnosis not present

## 2020-09-15 DIAGNOSIS — I10 Essential (primary) hypertension: Secondary | ICD-10-CM | POA: Diagnosis not present

## 2020-09-15 DIAGNOSIS — I4891 Unspecified atrial fibrillation: Secondary | ICD-10-CM | POA: Diagnosis not present

## 2020-09-15 DIAGNOSIS — F0390 Unspecified dementia without behavioral disturbance: Secondary | ICD-10-CM | POA: Diagnosis not present

## 2020-09-15 DIAGNOSIS — I2510 Atherosclerotic heart disease of native coronary artery without angina pectoris: Secondary | ICD-10-CM | POA: Diagnosis not present

## 2020-09-15 DIAGNOSIS — E119 Type 2 diabetes mellitus without complications: Secondary | ICD-10-CM | POA: Diagnosis not present

## 2020-09-19 DIAGNOSIS — E119 Type 2 diabetes mellitus without complications: Secondary | ICD-10-CM | POA: Diagnosis not present

## 2020-09-19 DIAGNOSIS — I2510 Atherosclerotic heart disease of native coronary artery without angina pectoris: Secondary | ICD-10-CM | POA: Diagnosis not present

## 2020-09-19 DIAGNOSIS — I4891 Unspecified atrial fibrillation: Secondary | ICD-10-CM | POA: Diagnosis not present

## 2020-09-19 DIAGNOSIS — F0390 Unspecified dementia without behavioral disturbance: Secondary | ICD-10-CM | POA: Diagnosis not present

## 2020-09-19 DIAGNOSIS — I10 Essential (primary) hypertension: Secondary | ICD-10-CM | POA: Diagnosis not present

## 2020-09-19 DIAGNOSIS — F05 Delirium due to known physiological condition: Secondary | ICD-10-CM | POA: Diagnosis not present

## 2020-09-20 ENCOUNTER — Other Ambulatory Visit: Payer: Self-pay

## 2020-09-20 ENCOUNTER — Encounter: Payer: Medicare Other | Attending: Psychology | Admitting: Psychology

## 2020-09-20 DIAGNOSIS — I6789 Other cerebrovascular disease: Secondary | ICD-10-CM | POA: Insufficient documentation

## 2020-09-20 DIAGNOSIS — F0150 Vascular dementia without behavioral disturbance: Secondary | ICD-10-CM | POA: Diagnosis not present

## 2020-09-21 ENCOUNTER — Ambulatory Visit (INDEPENDENT_AMBULATORY_CARE_PROVIDER_SITE_OTHER): Payer: Medicare Other | Admitting: Neurology

## 2020-09-21 ENCOUNTER — Encounter: Payer: Self-pay | Admitting: Neurology

## 2020-09-21 VITALS — BP 148/80 | HR 78 | Ht 66.0 in | Wt 163.0 lb

## 2020-09-21 DIAGNOSIS — F0390 Unspecified dementia without behavioral disturbance: Secondary | ICD-10-CM

## 2020-09-21 DIAGNOSIS — I2510 Atherosclerotic heart disease of native coronary artery without angina pectoris: Secondary | ICD-10-CM

## 2020-09-21 DIAGNOSIS — R443 Hallucinations, unspecified: Secondary | ICD-10-CM

## 2020-09-21 NOTE — Progress Notes (Signed)
Subjective:    Patient ID: Jonathan [name] is a 86 y.o. male.  HPI     Interim history:   Jonathan [name] is an 86 year old right-handed gentleman with an underlying complex medical history of hypertension, A. fib, coronary artery disease, arthritis, borderline diabetes, reflux disease, status post multiple surgeries including bilateral hip arthroplasties, hernia repairs, cataract surgeries, corneal transplant, multiple EGDs, and mildly overweight state, and sleep apnea, who presents for Follow-up consultation of his memory loss.  The patient is accompanied by his wife today.  I saw him on 07/14/2019 at the request of her primary care physician for evaluation of his memory loss of several years duration.  He had recently started Aricept low-dose.  MMSE was 20 out of 30 at the time.  He was advised to continue with the Aricept.  He saw Debbora Presto, nurse practitioner in the interim on 01/12/2020 at which time he was on Aricept.  He had hallucinations almost on a daily basis.  He was on Seroquel.  His MMSE was 24 out of 30 at the time.    He had a repeat brain MRI in the interim on 02/12/2020 and I reviewed the results: Impression: Mild generalized atrophy and findings of chronic small vessel disease without acute intracranial abnormality.  He saw Debbora Presto, nurse practitioner in follow-up on 07/13/2020, at which time he was noted to have more physical deconditioning and decline, multiple falls.  He was on Aricept 5 mg daily. His Aricept was increased to 10 mg daily.  Today, 09/21/2020: He reports that he probably has become worse over time.  His wife provides most of the history and noticed a decline in his memory, she is also reporting that he has daily hallucinations.  He was taken off the Risperdal on 07/22/2020 and is Aricept was increased to 10 mg daily also on 07/22/2020.  He is sleepy during the day.  He will fall asleep when sedentary.  He has fallen, mostly when trying to sit down in his chair and  missing the chair.  Thankfully, he has not fallen to the ground when standing up or hurting himself seriously.  He does now use his rolling walker more consistently.  He is also in home health physical and occupational therapy.  He has an appointment with Dr. Sima Matas with memory testing scheduled for early January 2022 and a follow-up appointment in early March 2022.  His wife is reluctant to add any new medication at this time for fear of exacerbating his sleepiness.  She is at the same time also worried about his memory worsening.  She has not noticed a worsening in his hallucinations necessarily after stopping the Risperdal.    The patient's allergies, current medications, family history, past medical history, past social history, past surgical history and problem list were reviewed and updated as appropriate.    Previously:    I previously evaluated him for gait disorder and concern for parkinsonism at the request of his primary care physician on 07/14/2017.  I did not see any telltale signs of parkinsonism.  He had fallen, he was not using his walker at the time and was encouraged to consistently use his walker due to fall risk.  I suggested we proceed with a brain MRI.  He had a brain MRI without contrast on 07/29/2017 and I reviewed the results:   IMPRESSION:  Slightly abnormal MRI scan of brain showing age appropriate changes of mild chronic microvascular ischemia and generalized cerebral atrophy.     We  called the patient and his wife at the time with the results.     07/14/2017: (He) reports recent difficulty with maneuvering stairs, he had difficulty finishing sentences and felt that he was drooling more. He is worried that he may have had a stroke. He has memory loss for the past year or longer. I reviewed your office note from 06/02/2017. He had blood work at the time including B12 and TSH. He also had an A1c and BMP check at the time, we will request test results from your office.,  Which you kindly included. His wife reports that he has had motor problems for over one year, and attributes some of this to fatigue and due to new onset a fib. This was noted in May, after a recent procedure of esophageal dilatation. He did not require cardioversion, but has had PAF.  He has fallen, has a walker, but does not use it. Wife has taken over the driving in the past month, as he recently got confused inside his neighborhood.  During a recent ER visit she says, his heart rate was in the 40s.   He had a recent [place] wo contrast on 05/23/17, which I reviewed: IMPRESSION: No acute finding or explanation for symptoms. He had a high cervical Fx some years ago, saw Dr. Joya Salm at the time.    With respect to speech and gait instability, he and his wife report that he is better. He has new hearing aids too.     His Past Medical History Is Significant For: Past Medical History:  Diagnosis Date  . Acne rosacea   . Arthritis   . Atrial fibrillation ([place])   . Borderline diabetes   . CAD (coronary artery disease)   . Diabetes mellitus without complication ([place])   . Diverticulosis   . Elevated PSA   . Esophageal stricture   . GERD (gastroesophageal reflux disease)   . History of nuclear stress test    Nuclear stress test 6/18: EF 59, small apical defect (?old MI); no ischemia; Low Risk  . Hypertension   . Myocardial infarction ([place])   . Neck fracture ([place]) 2015  . PONV (postoperative nausea and vomiting) 08/09/2014  . Shoulder pain     His Past Surgical History Is Significant For: Past Surgical History:  Procedure Laterality Date  . BALLOON DILATION  04/02/2012   Procedure: BALLOON DILATION;  Surgeon: Arta Silence, MD;  Location: [facility] END[facility]COPY;  Service: Endoscopy;  Laterality: N/A;  . BALLOON DILATION N/A 06/16/2013   Procedure: BALLOON DILATION;  Surgeon: Arta Silence, MD;  Location: [facility];  Service: Endoscopy;  Laterality: N/A;  . BALLOON DILATION N/A 11/16/2014   Procedure:  BALLOON DILATION;  Surgeon: Arta Silence, MD;  Location: [facility];  Service: Endoscopy;  Laterality: N/A;  . BALLOON DILATION N/A 02/13/2017   Procedure: BALLOON DILATION;  Surgeon: Arta Silence, MD;  Location: [facility] ENDOSCOPY;  Service: Endoscopy;  Laterality: N/A;  . CORNEAL TRANSPLANT    . ESOPHAGOGASTRODUODENOSCOPY  04/02/2012   Procedure: ESOPHAGOGASTRODUODENOSCOPY (EGD);  Surgeon: Arta Silence, MD;  Location: [facility];  Service: Endoscopy;  Laterality: N/A;  . ESOPHAGOGASTRODUODENOSCOPY N/A 05/28/2013   Procedure: ESOPHAGOGASTRODUODENOSCOPY (EGD);  Surgeon: Cleotis Nipper, MD;  Location: [facility];  Service: Endoscopy;  Laterality: N/A;  . ESOPHAGOGASTRODUODENOSCOPY N/A 02/20/2015   Procedure: ESOPHAGOGASTRODUODENOSCOPY (EGD);  Surgeon: Arta Silence, MD;  Location: [facility];  Service: Endoscopy;  Laterality: N/A;  . ESOPHAGOGASTRODUODENOSCOPY (EGD) WITH PROPOFOL N/A 06/16/2013   Procedure: ESOPHAGOGASTRODUODENOSCOPY (EGD) WITH PROPOFOL;  Surgeon: Arta Silence, MD;  Location: [facility];  Service: Endoscopy;  Laterality: N/A;  . ESOPHAGOGASTRODUODENOSCOPY (EGD) WITH PROPOFOL N/A 11/16/2014   Procedure: ESOPHAGOGASTRODUODENOSCOPY (EGD) WITH PROPOFOL;  Surgeon: Arta Silence, MD;  Location: [facility];  Service: Endoscopy;  Laterality: N/A;  . ESOPHAGOGASTRODUODENOSCOPY (EGD) WITH PROPOFOL N/A 02/13/2017   Procedure: ESOPHAGOGASTRODUODENOSCOPY (EGD) WITH PROPOFOL;  Surgeon: Arta Silence, MD;  Location: [facility];  Service: Endoscopy;  Laterality: N/A;  . EYE SURGERY     cataract sx  . HERNIA REPAIR     x 2  . TOTAL HIP ARTHROPLASTY     bilateral    His Family History Is Significant For: Family History  Family history unknown: Yes    His Social History Is Significant For: Social History   Socioeconomic History  . Marital status: Married    Spouse name: Rose  . Number of children: Not on file  . Years of education: Not on file  . Highest education level:  Some college, no degree  Occupational History  . Not on file  Tobacco Use  . Smoking status: Never Smoker  . Smokeless tobacco: Never Used  Vaping Use  . Vaping Use: Never used  Substance and Sexual Activity  . Alcohol use: No  . Drug use: No  . Sexual activity: Not on file  Other Topics Concern  . Not on file  Social History Narrative   09/21/20 lives with wife   Social Determinants of Health   Financial Resource Strain: Not on file  Food Insecurity: Not on file  Transportation Needs: Not on file  Physical Activity: Not on file  Stress: Not on file  Social Connections: Not on file    His Allergies Are:  No Known Allergies:   His Current Medications Are:  Outpatient Encounter Medications as of 09/21/2020  Medication Sig  . apixaban (ELIQUIS) 5 MG TABS tablet Take 1 tablet (5 mg total) by mouth 2 (two) times daily.  Marland Kitchen atorvastatin (LIPITOR) 10 MG tablet Take 10 mg tablet by mouth Monday, Wednesday and friday  . colchicine 0.6 MG tablet TK 1 T PO Q 12 H PRF GOUT FLARE  . docusate sodium (COLACE) 100 MG capsule Take 200 mg by mouth at bedtime.  . donepezil (ARICEPT) 10 MG tablet Take 1 tablet (10 mg total) by mouth at bedtime.  . finasteride (PROSCAR) 5 MG tablet Take 5 mg by mouth every morning.   . halobetasol (ULTRAVATE) 0.05 % ointment Apply 1 application topically 2 (two) times daily as needed (itching or swelling). APPLY TWICE DAILY TO HANDS  . isosorbide mononitrate (IMDUR) 30 MG 24 hr tablet Take 30 mg by mouth daily with lunch.   . losartan (COZAAR) 25 MG tablet SMARTSIG:1 Tablet(s) By Mouth Every Evening  . nitroGLYCERIN (NITROSTAT) 0.4 MG SL tablet Place under the tongue.  Marland Kitchen omeprazole (PRILOSEC) 20 MG capsule Take 20 mg by mouth daily.  Marland Kitchen oxybutynin (DITROPAN) 5 MG tablet Take 2.5 mg by mouth 2 (two) times daily as needed. 1/2 tab  . [DISCONTINUED] losartan (COZAAR) 50 MG tablet Take 50 mg by mouth at bedtime. 1/2 tab  . amLODipine (NORVASC) 5 MG tablet Take 5 mg  by mouth daily. 1/2 tab (Patient not taking: Reported on 09/21/2020)  . risperiDONE (RISPERDAL) 1 MG tablet Take 1 mg by mouth at bedtime. (Patient not taking: Reported on 09/21/2020)   No facility-administered encounter medications on file as of 09/21/2020.  :  Review of Systems:  Out of a complete 14 point review  of systems, all are reviewed and negative with the exception of these symptoms as listed below: Review of Systems  Neurological:       Rm 1 dementia FU with wife, Jonathan [name], has fallen a few times, no injuries. Memory declining per wife.  MMSE 17    Objective:  Neurological Exam  Physical Exam Physical Examination:   Vitals:   09/21/20 1016  BP: (!) 148/80  Pulse: 78    General Examination: The patient is a very pleasant 86 y.o. male in no acute distress.  He appears frail and deconditioned, he is minimally verbal.  He is well-groomed.   HEENT:Normocephalic, atraumatic, pupils are equal, round and reactive to light, he has corrective eyeglasses in place, extraocular tracking is impaired mildly.  Hearing is impaired mildly, he has bilateral hearing aids.   Face is symmetric with normal facial animation. Speech is scant with mild hypophonia, no voice tremor, no dysarthria.  Airway examination reveals moderate to severe mouth dryness, tongue protrudes centrally and palate elevates symmetrically.    Chest:Clear to auscultation without wheezing, rhonchi or crackles noted.  Heart:S1+S2+0, slightly irregular.   Abdomen:Soft, non-tender and non-distended with normal bowel sounds appreciated on auscultation.  Extremities:There isno pitting edema in the distal lower extremities bilaterally.  Skin: Warm anddry withchronic sun exposure type changes and age-related changes noted in both forearms and distal legs.  Musculoskeletal: exam reveals no obvious joint deformities, tenderness or joint swelling or erythema.   Neurologically:  Mental status: The patient is awake,  alert and oriented in all 4 spheres.Hisimmediate and remote memory, attention, language skills and fund of knowledge are impaired. There is no evidence of aphasia, agnosia, apraxia or anomia. Speech is clear with normal prosody and enunciation. Thought process is linear. Mood isnormaland affect is normal.  On10/10/2016: MMSE: 22/30, CDT: 4/4, AFT: 10/min.  On 07/14/2019: MMSE: 20/30, CDT: 4/4, AFT: 7/min.  On 09/21/2020: MMSE: 17/30, CDT: 1/4, AFT: 8/min.  Cranial nerves II - XII are as described above under HEENT exam.  Motor exam: Normal bulk, global strength of 4 out of 5, normal tone, he has an intermittent resting tremor in both upper extremities, mild, no lateralization noted.  [place] is not tested for safety reasons.  Fine motor skills are globally mild to moderately impaired.  Cerebellar testing: No dysmetria or intention tremor. There is no truncal or gait ataxia.  Sensory exam: intact to light touch in the upper and lower extremities.  Gait, station and balance:Hestandswith mild to moderate difficulty and requires some guidance and assistance.  He has a mild to moderately stooped posture, he walks with his rolling walker and can maneuver it quite well but has difficulty sitting back down and needs assistance/guidance.   Assessmentand Plan:   [place] a very pleasant 35 year oldmalewith an underlyingcomplexmedical history of hypertension, A. fib, coronary artery disease, arthritis, borderline diabetes, reflux disease, status post multiple surgeries including bilateral hip arthroplasties, hernia repairs, cataract surgeries, corneal transplant, multiple EGDs, OSA on CPAP, and mildly overweight state, whopresents for Follow-up consultation of his dementia.  He has had decline of his memory over the past few years. Memory loss has been ongoing for at least 4 years.  He does have risk factors for vascular disease.  He has had mild parkinsonism.  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  He stopped this 2 months ago.  He also increased his Aricept 2 months ago.  He is on 10 mg once daily at this time  and off of Risperdal completely.  His memory scores have declined.  He has been more sleepy during the day.  He continues to have hallucinations daily per wife. He had a brain MRI in 2018.    He had a repeat brain MRI on 02/12/2020 which showed Mild generalized atrophy and findings of chronic small vessel disease without acute intracranial abnormality.he has fallen, thankfully without injuries reported.  He is advised to increase his water intake if possible and use his rolling walker at all times.  He is scheduled to see Dr. Sima Matas for neuropsychological evaluation in January 2022.  I suggested cautious and low-dose Namenda at this time such as 5 mg generic once daily.  His wife is reluctant as he is sleepy during the day and she would like to wait things out a little longer.  I think it is reasonable to wait out his appointments in January and his follow-up with neuropsychology in March 2022.  He is advised to follow-up with Debbora Presto, nurse practitioner in mid March 2022 and we can consider adding Namenda generic in low dose at the time, such as 5 mg once daily for now.  I answered all their questions today and the patient and his wife are in agreement.

## 2020-09-21 NOTE — Patient Instructions (Addendum)
It was good to see you both again today. As discussed, we will continue with your medication regimen.  You have been off of Risperdal since October 2021 and you have increased your donepezil to 10 mg daily also in October 2021. You are scheduled to have evaluation through Dr. Ferne Coe office in January 2022 and a follow-up in March 2022.  Please follow-up after that with Debbora Presto, nurse practitioner and we will consider adding a second medication for your memory, called Namenda, the generic name is memantine. Please use your walker at all times and try to hydrate better with water.  Continue with home health therapy.  I would recommend that you try to get an aide or caretaker to come to the house for additional help during the day and supervision if needed.

## 2020-09-22 DIAGNOSIS — I10 Essential (primary) hypertension: Secondary | ICD-10-CM | POA: Diagnosis not present

## 2020-09-22 DIAGNOSIS — E119 Type 2 diabetes mellitus without complications: Secondary | ICD-10-CM | POA: Diagnosis not present

## 2020-09-22 DIAGNOSIS — F05 Delirium due to known physiological condition: Secondary | ICD-10-CM | POA: Diagnosis not present

## 2020-09-22 DIAGNOSIS — I2510 Atherosclerotic heart disease of native coronary artery without angina pectoris: Secondary | ICD-10-CM | POA: Diagnosis not present

## 2020-09-22 DIAGNOSIS — I4891 Unspecified atrial fibrillation: Secondary | ICD-10-CM | POA: Diagnosis not present

## 2020-09-22 DIAGNOSIS — F0390 Unspecified dementia without behavioral disturbance: Secondary | ICD-10-CM | POA: Diagnosis not present

## 2020-09-24 DIAGNOSIS — I10 Essential (primary) hypertension: Secondary | ICD-10-CM | POA: Diagnosis not present

## 2020-09-24 DIAGNOSIS — F05 Delirium due to known physiological condition: Secondary | ICD-10-CM | POA: Diagnosis not present

## 2020-09-24 DIAGNOSIS — Z7901 Long term (current) use of anticoagulants: Secondary | ICD-10-CM | POA: Diagnosis not present

## 2020-09-24 DIAGNOSIS — D6869 Other thrombophilia: Secondary | ICD-10-CM | POA: Diagnosis not present

## 2020-09-24 DIAGNOSIS — I252 Old myocardial infarction: Secondary | ICD-10-CM | POA: Diagnosis not present

## 2020-09-24 DIAGNOSIS — G4733 Obstructive sleep apnea (adult) (pediatric): Secondary | ICD-10-CM | POA: Diagnosis not present

## 2020-09-24 DIAGNOSIS — M109 Gout, unspecified: Secondary | ICD-10-CM | POA: Diagnosis not present

## 2020-09-24 DIAGNOSIS — I4891 Unspecified atrial fibrillation: Secondary | ICD-10-CM | POA: Diagnosis not present

## 2020-09-24 DIAGNOSIS — F0390 Unspecified dementia without behavioral disturbance: Secondary | ICD-10-CM | POA: Diagnosis not present

## 2020-09-24 DIAGNOSIS — I2510 Atherosclerotic heart disease of native coronary artery without angina pectoris: Secondary | ICD-10-CM | POA: Diagnosis not present

## 2020-09-24 DIAGNOSIS — E119 Type 2 diabetes mellitus without complications: Secondary | ICD-10-CM | POA: Diagnosis not present

## 2020-09-25 DIAGNOSIS — F05 Delirium due to known physiological condition: Secondary | ICD-10-CM | POA: Diagnosis not present

## 2020-09-25 DIAGNOSIS — I2510 Atherosclerotic heart disease of native coronary artery without angina pectoris: Secondary | ICD-10-CM | POA: Diagnosis not present

## 2020-09-25 DIAGNOSIS — I10 Essential (primary) hypertension: Secondary | ICD-10-CM | POA: Diagnosis not present

## 2020-09-25 DIAGNOSIS — F0390 Unspecified dementia without behavioral disturbance: Secondary | ICD-10-CM | POA: Diagnosis not present

## 2020-09-25 DIAGNOSIS — I4891 Unspecified atrial fibrillation: Secondary | ICD-10-CM | POA: Diagnosis not present

## 2020-09-25 DIAGNOSIS — E119 Type 2 diabetes mellitus without complications: Secondary | ICD-10-CM | POA: Diagnosis not present

## 2020-09-27 DIAGNOSIS — I4891 Unspecified atrial fibrillation: Secondary | ICD-10-CM | POA: Diagnosis not present

## 2020-09-27 DIAGNOSIS — E119 Type 2 diabetes mellitus without complications: Secondary | ICD-10-CM | POA: Diagnosis not present

## 2020-09-27 DIAGNOSIS — I2510 Atherosclerotic heart disease of native coronary artery without angina pectoris: Secondary | ICD-10-CM | POA: Diagnosis not present

## 2020-09-27 DIAGNOSIS — F05 Delirium due to known physiological condition: Secondary | ICD-10-CM | POA: Diagnosis not present

## 2020-09-27 DIAGNOSIS — F0390 Unspecified dementia without behavioral disturbance: Secondary | ICD-10-CM | POA: Diagnosis not present

## 2020-09-27 DIAGNOSIS — I10 Essential (primary) hypertension: Secondary | ICD-10-CM | POA: Diagnosis not present

## 2020-09-28 DIAGNOSIS — I4891 Unspecified atrial fibrillation: Secondary | ICD-10-CM | POA: Diagnosis not present

## 2020-09-28 DIAGNOSIS — E119 Type 2 diabetes mellitus without complications: Secondary | ICD-10-CM | POA: Diagnosis not present

## 2020-09-28 DIAGNOSIS — I10 Essential (primary) hypertension: Secondary | ICD-10-CM | POA: Diagnosis not present

## 2020-09-28 DIAGNOSIS — F05 Delirium due to known physiological condition: Secondary | ICD-10-CM | POA: Diagnosis not present

## 2020-09-28 DIAGNOSIS — I2510 Atherosclerotic heart disease of native coronary artery without angina pectoris: Secondary | ICD-10-CM | POA: Diagnosis not present

## 2020-09-28 DIAGNOSIS — F0390 Unspecified dementia without behavioral disturbance: Secondary | ICD-10-CM | POA: Diagnosis not present

## 2020-09-29 ENCOUNTER — Encounter: Payer: Self-pay | Admitting: Psychology

## 2020-09-29 DIAGNOSIS — Z23 Encounter for immunization: Secondary | ICD-10-CM | POA: Diagnosis not present

## 2020-09-29 NOTE — Progress Notes (Signed)
Neuropsychological Consultation   Patient:   Jonathan [name]   DOB:   1931-05-02  MR Number:  [id number]  Location:  [facility] PHYSICAL MEDICINE AND RE[facility]ILITATION [place], [place] Site [id number] [place] [address] Dept: [phone]           Date of Service:   09/20/2020  Start Time:   11 AM End Time:   1 PM  Today's visit was a follow-up appointment after 1 year to reassess the patient regarding etiological factors for his cognitive decline and reassess treatment recommendations and refined diagnostic considerations.  Provider/Observer:  Ilean Skill, Psy.D.       Clinical Neuropsychologist       Billing Code/Service: 96116/96121  Chief Complaint:    Jonathan [name] is an 86 year old male who was initially referred for neuropsychological evaluation by his treating neurologist  [name], MD. the patient is here for follow-up neuropsychological assessment.  He was initially seen for full neuropsychological evaluation in January and February 2021.  This initial evaluation left a diagnostic impression that the primary issues due to vascular involvement with microvascular ischemic changes particularly in the white matter, hippocampal and frontal lobe regions of the brain.  His pattern what is not consistent with symptoms typically seen with degenerative cortical dementia such as Alzheimer's or Lewy body.  Reason for Service:  The patient is here for follow-up neuropsychological evaluation.  Below I will include a copy that will be italicized and underlying of his reason for service initially at the beginning of 2021.  During the clinical interview today, the patient and his wife were present.  The patient reports that he has been having more difficulty with swallowing and has building up mucus and having symptoms consistent with esophageal spasms.  The patient also describes increasing problems with balance and  great difficulty getting up and down from a chair.  The patient's wife reports that the patient has progressively gotten slower it is information processing time and needs more direct instruction and direction to accomplish activities.  She reports that his memory continues to be quite problematic and he is not able to focus his attention and concentration.  The patient needs significant help with ADLs including taking showers and even has difficulty to the point of having difficulty turning over in his bed.  The patient is described as being in conversation and will suddenly "go away" from the conversation and closes eyes but not appear to be falling off to sleep.  The patient is described as having significant visual hallucinations at night and he will report seen people at the foot of his bed and he will need or is supposed to help them out.  The patient is unable to be left alone because of significant cognitive deficits etc.  The patient his wife and medical records do not describe any particularly new medical information with the exception of the patient essentially giving up on attempts to use a CPAP device for diagnosed obstructive sleep apnea.  The patient was not able to ever get comfortable with using the mask and had difficulty with its proper fitting and use.  The patient's wife reports that they just went back to the doctor and stopped all efforts for CPAP use.  The patient continues to have significant issues with esophageal spasms and has trouble taking medications because of this.  The patient does have long-term care insurance that could potentially help with having someone coming to the house  and assist the patient's wife in his care.  Jonathan [name] is an 86 year old male referred bySaima Athar, MDfor neuropsychological evaluation due to progressing memory loss and memory changes, changes in executive functioning, gait disturbance and balance issues, as well as the development of visual and  some auditory hallucinations particularly at night. The patient has a history of underlying complex medical issues including hypertension, A. fib, coronary artery disease, arthritis, borderline diabetes, reflux disease, multiple prior surgeries including bilateral hip arthroplasties, hernia repair, cataract surgeries, corneal transplant, multiple EGDs and history of sleep apnea being treated with CPAP device. The patient and his wife report that they began noticing some symptoms approximately 5 years ago. The patient's wife reports that she first noticed the patient having difficulties with runny nose and drooling as well as tremors around 5 years ago. This was after he was hospitalized for a bowel obstruction. She reports that he has had difficulties ever since. They describe issues with memory particularly short-term memory difficulties, changes in motor function and gait difficulties, difficulty with maintaining train of thought and concentration, and issues with executive functioning.  The patient had a fall in the past where he broke C3 but did not go into details about this injury. The patient has been dealing with hypertension since he was around age 65 and has been told that he had a "silent heart attack" and is also dealing with borderline diabetes. He does describe a brief time of amnesia around the time of his fall and over the night after his fall.  The patient himself reports that while long-term memory seems to be okay he is having difficulty with short-term memory although he reports that over time sometimes he is able to recall information. The patient's wife reports that the patient will often loses train of thoughts and cannot maintain attention and focus. 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 He was always able to solve issues quite easily. However, she reports that he is  not able to do even basic types of repairs around the house and has trouble remembering how to execute various repairs. She reports that he is also having difficulties remembering recent events. The patient's wife reports a significant reduction in energy levels and activities and often sleeps not only at night but during the day as well. The patient has been diagnosed with obstructive sleep apnea and has used a CPAP device. They both report that he uses a CPAP device at night but does not use it when he takes naps during the day. He was encouraged to do so.  They both describe the development of visual and auditory hallucinations that happen more often at night but can happen during the day. The patient and his wife both report that he often will see a child either hiding behind a chair or in the room. He is aware that these are not real events but does clearly see or hear these hallucinations and again, they are often children in nature.  The patient and his wife have had a significant reduction in their social interactions primarily due to COVID-19 as well as concerned that the patient will have other falls. He does keep a cane but does not always use it and does not use a cane around the house.  The patient had an MRI in 2018 with an impression of slightly abnormal MRI of brain showing age-appropriate changes  of mild chronic microvascular ischemia and generalized atrophy. CT scan of the head conducted in 2019 showed no evidence of acute infarction, hemorrhage, hydrocephalus, or other mass lesion or mass-effect. There was cerebral volume loss with mild to moderate chronic vessel ischemia in the cerebral white matter.  The patient reports that his appetite is good but is excessively drooling when he is sleeping or has a runny nose during the day, causing him issues and problems. The patient is also generally dysphoric and is having trouble coping with his significant loss of function and the  fact that he is unable to help out his kids, who live next door.  The initial neuropsychological appointment was conducted on 11/23/2019 and that record in his EMR will have complete background history etc.  Medical History:   Past Medical History:  Diagnosis Date  . Acne rosacea   . Arthritis   . Atrial fibrillation ([place])   . Borderline diabetes   . CAD (coronary artery disease)   . Diabetes mellitus without complication ([place])   . Diverticulosis   . Elevated PSA   . Esophageal stricture   . GERD (gastroesophageal reflux disease)   . History of nuclear stress test    Nuclear stress test 6/18: EF 59, small apical defect (?old MI); no ischemia; Low Risk  . Hypertension   . Myocardial infarction ([place])   . Neck fracture ([place]) 2015  . PONV (postoperative nausea and vomiting) 08/09/2014  . Shoulder pain          Patient Active Problem List   Diagnosis Date Noted  . Obstructive sleep apnea on CPAP 10/05/2018  . Aspiration pneumonia due to gastric secretions ([place])   . Hypervolemia   . Hypokalemia 03/15/2018  . Hypomagnesemia 03/15/2018  . Bladder mass 03/15/2018  . Small bowel obstruction ([place]) 03/11/2018  . Diabetes mellitus ([place]) 03/11/2018  . GERD (gastroesophageal reflux disease) 03/11/2018  . Esophageal stricture 03/11/2018  . SBO (small bowel obstruction) ([place])   . Symptomatic sinus bradycardia   . Paroxysmal atrial fibrillation ([place]) 02/14/2017  . Essential hypertension 02/14/2017  . CAD (coronary artery disease) 07/10/2015  . Fatigue 07/10/2015  . PONV (postoperative nausea and vomiting) 08/09/2014  . Brow ptosis 06/08/2013  . Dermatochalasis 09/23/2012  . Diabetes mellitus type 2, diet-controlled ([place]) 09/23/2012  . After-cataract 12/26/2011  . Bilateral dry eyes 12/26/2011  . Blepharitis 10/03/2011  . Status post corneal transplant 10/03/2011  . Pseudophakia, both eyes 08/09/2006        Impression/DX:  At this point, the patient does appear to have further  progressed in his cognitive and memory deficits with likely further deterioration due to cerebrovascular issues.  However, visual hallucinations are becoming more pronounced.  The patient needs an objective assessment of current cognitive functioning.  It is too bad that the patient was not able to tolerate a CPAP device as it may have helped with some of his symptoms and even progression of his symptoms.  Disposition/Plan:  We have set the patient up for repeat neuropsychological evaluation.  He was initially administered the repeatable battery for the assessment of neuropsychological status back at the beginning of this year with the expectation of serial testing.  If possible we will do the other measures that were initially given including measures of expressive language, visual constructional abilities, grooved pegboard test, [place] card sorting test.  These previous performances including the RBANS will be documented for direct comparisons to assess for specific changes in various cognitive domains.  Diagnosis:  Vascular dementia without behavioral disturbance ([place])  Cerebral microvascular disease         Electronically Signed   _______________________ Ilean Skill, Psy.D. Clinical Neuropsychologist

## 2020-10-02 DIAGNOSIS — I2510 Atherosclerotic heart disease of native coronary artery without angina pectoris: Secondary | ICD-10-CM | POA: Diagnosis not present

## 2020-10-02 DIAGNOSIS — F0390 Unspecified dementia without behavioral disturbance: Secondary | ICD-10-CM | POA: Diagnosis not present

## 2020-10-02 DIAGNOSIS — I10 Essential (primary) hypertension: Secondary | ICD-10-CM | POA: Diagnosis not present

## 2020-10-02 DIAGNOSIS — E119 Type 2 diabetes mellitus without complications: Secondary | ICD-10-CM | POA: Diagnosis not present

## 2020-10-02 DIAGNOSIS — F05 Delirium due to known physiological condition: Secondary | ICD-10-CM | POA: Diagnosis not present

## 2020-10-02 DIAGNOSIS — I4891 Unspecified atrial fibrillation: Secondary | ICD-10-CM | POA: Diagnosis not present

## 2020-10-04 DIAGNOSIS — I10 Essential (primary) hypertension: Secondary | ICD-10-CM | POA: Diagnosis not present

## 2020-10-04 DIAGNOSIS — I2510 Atherosclerotic heart disease of native coronary artery without angina pectoris: Secondary | ICD-10-CM | POA: Diagnosis not present

## 2020-10-04 DIAGNOSIS — F0390 Unspecified dementia without behavioral disturbance: Secondary | ICD-10-CM | POA: Diagnosis not present

## 2020-10-04 DIAGNOSIS — F05 Delirium due to known physiological condition: Secondary | ICD-10-CM | POA: Diagnosis not present

## 2020-10-04 DIAGNOSIS — I4891 Unspecified atrial fibrillation: Secondary | ICD-10-CM | POA: Diagnosis not present

## 2020-10-04 DIAGNOSIS — E119 Type 2 diabetes mellitus without complications: Secondary | ICD-10-CM | POA: Diagnosis not present

## 2020-10-05 DIAGNOSIS — F05 Delirium due to known physiological condition: Secondary | ICD-10-CM | POA: Diagnosis not present

## 2020-10-05 DIAGNOSIS — I10 Essential (primary) hypertension: Secondary | ICD-10-CM | POA: Diagnosis not present

## 2020-10-05 DIAGNOSIS — I2510 Atherosclerotic heart disease of native coronary artery without angina pectoris: Secondary | ICD-10-CM | POA: Diagnosis not present

## 2020-10-05 DIAGNOSIS — F0390 Unspecified dementia without behavioral disturbance: Secondary | ICD-10-CM | POA: Diagnosis not present

## 2020-10-05 DIAGNOSIS — E119 Type 2 diabetes mellitus without complications: Secondary | ICD-10-CM | POA: Diagnosis not present

## 2020-10-05 DIAGNOSIS — I4891 Unspecified atrial fibrillation: Secondary | ICD-10-CM | POA: Diagnosis not present

## 2020-10-09 DIAGNOSIS — F0390 Unspecified dementia without behavioral disturbance: Secondary | ICD-10-CM | POA: Diagnosis not present

## 2020-10-09 DIAGNOSIS — F05 Delirium due to known physiological condition: Secondary | ICD-10-CM | POA: Diagnosis not present

## 2020-10-09 DIAGNOSIS — I10 Essential (primary) hypertension: Secondary | ICD-10-CM | POA: Diagnosis not present

## 2020-10-09 DIAGNOSIS — I4891 Unspecified atrial fibrillation: Secondary | ICD-10-CM | POA: Diagnosis not present

## 2020-10-09 DIAGNOSIS — I2510 Atherosclerotic heart disease of native coronary artery without angina pectoris: Secondary | ICD-10-CM | POA: Diagnosis not present

## 2020-10-09 DIAGNOSIS — E119 Type 2 diabetes mellitus without complications: Secondary | ICD-10-CM | POA: Diagnosis not present

## 2020-10-10 DIAGNOSIS — I2510 Atherosclerotic heart disease of native coronary artery without angina pectoris: Secondary | ICD-10-CM | POA: Diagnosis not present

## 2020-10-10 DIAGNOSIS — I10 Essential (primary) hypertension: Secondary | ICD-10-CM | POA: Diagnosis not present

## 2020-10-10 DIAGNOSIS — E119 Type 2 diabetes mellitus without complications: Secondary | ICD-10-CM | POA: Diagnosis not present

## 2020-10-10 DIAGNOSIS — F0390 Unspecified dementia without behavioral disturbance: Secondary | ICD-10-CM | POA: Diagnosis not present

## 2020-10-10 DIAGNOSIS — F05 Delirium due to known physiological condition: Secondary | ICD-10-CM | POA: Diagnosis not present

## 2020-10-10 DIAGNOSIS — I4891 Unspecified atrial fibrillation: Secondary | ICD-10-CM | POA: Diagnosis not present

## 2020-10-11 DIAGNOSIS — G4733 Obstructive sleep apnea (adult) (pediatric): Secondary | ICD-10-CM | POA: Diagnosis not present

## 2020-10-11 DIAGNOSIS — F0390 Unspecified dementia without behavioral disturbance: Secondary | ICD-10-CM | POA: Diagnosis not present

## 2020-10-11 DIAGNOSIS — D6869 Other thrombophilia: Secondary | ICD-10-CM | POA: Diagnosis not present

## 2020-10-11 DIAGNOSIS — J40 Bronchitis, not specified as acute or chronic: Secondary | ICD-10-CM | POA: Diagnosis not present

## 2020-10-11 DIAGNOSIS — I25119 Atherosclerotic heart disease of native coronary artery with unspecified angina pectoris: Secondary | ICD-10-CM | POA: Diagnosis not present

## 2020-10-11 DIAGNOSIS — R269 Unspecified abnormalities of gait and mobility: Secondary | ICD-10-CM | POA: Diagnosis not present

## 2020-10-11 DIAGNOSIS — R441 Visual hallucinations: Secondary | ICD-10-CM | POA: Diagnosis not present

## 2020-10-11 DIAGNOSIS — I209 Angina pectoris, unspecified: Secondary | ICD-10-CM | POA: Diagnosis not present

## 2020-10-11 DIAGNOSIS — I2510 Atherosclerotic heart disease of native coronary artery without angina pectoris: Secondary | ICD-10-CM | POA: Diagnosis not present

## 2020-10-11 DIAGNOSIS — I10 Essential (primary) hypertension: Secondary | ICD-10-CM | POA: Diagnosis not present

## 2020-10-11 DIAGNOSIS — F05 Delirium due to known physiological condition: Secondary | ICD-10-CM | POA: Diagnosis not present

## 2020-10-11 DIAGNOSIS — I4891 Unspecified atrial fibrillation: Secondary | ICD-10-CM | POA: Diagnosis not present

## 2020-10-11 DIAGNOSIS — G3184 Mild cognitive impairment, so stated: Secondary | ICD-10-CM | POA: Diagnosis not present

## 2020-10-11 DIAGNOSIS — Z23 Encounter for immunization: Secondary | ICD-10-CM | POA: Diagnosis not present

## 2020-10-11 DIAGNOSIS — N400 Enlarged prostate without lower urinary tract symptoms: Secondary | ICD-10-CM | POA: Diagnosis not present

## 2020-10-11 DIAGNOSIS — K219 Gastro-esophageal reflux disease without esophagitis: Secondary | ICD-10-CM | POA: Diagnosis not present

## 2020-10-11 DIAGNOSIS — E785 Hyperlipidemia, unspecified: Secondary | ICD-10-CM | POA: Diagnosis not present

## 2020-10-11 DIAGNOSIS — E119 Type 2 diabetes mellitus without complications: Secondary | ICD-10-CM | POA: Diagnosis not present

## 2020-10-17 DIAGNOSIS — I10 Essential (primary) hypertension: Secondary | ICD-10-CM | POA: Diagnosis not present

## 2020-10-17 DIAGNOSIS — F0390 Unspecified dementia without behavioral disturbance: Secondary | ICD-10-CM | POA: Diagnosis not present

## 2020-10-17 DIAGNOSIS — I2510 Atherosclerotic heart disease of native coronary artery without angina pectoris: Secondary | ICD-10-CM | POA: Diagnosis not present

## 2020-10-17 DIAGNOSIS — F05 Delirium due to known physiological condition: Secondary | ICD-10-CM | POA: Diagnosis not present

## 2020-10-17 DIAGNOSIS — I4891 Unspecified atrial fibrillation: Secondary | ICD-10-CM | POA: Diagnosis not present

## 2020-10-17 DIAGNOSIS — E119 Type 2 diabetes mellitus without complications: Secondary | ICD-10-CM | POA: Diagnosis not present

## 2020-10-20 DIAGNOSIS — I2510 Atherosclerotic heart disease of native coronary artery without angina pectoris: Secondary | ICD-10-CM | POA: Diagnosis not present

## 2020-10-20 DIAGNOSIS — F0390 Unspecified dementia without behavioral disturbance: Secondary | ICD-10-CM | POA: Diagnosis not present

## 2020-10-20 DIAGNOSIS — I4891 Unspecified atrial fibrillation: Secondary | ICD-10-CM | POA: Diagnosis not present

## 2020-10-20 DIAGNOSIS — I10 Essential (primary) hypertension: Secondary | ICD-10-CM | POA: Diagnosis not present

## 2020-10-20 DIAGNOSIS — E119 Type 2 diabetes mellitus without complications: Secondary | ICD-10-CM | POA: Diagnosis not present

## 2020-10-20 DIAGNOSIS — F05 Delirium due to known physiological condition: Secondary | ICD-10-CM | POA: Diagnosis not present

## 2020-10-21 DIAGNOSIS — I4891 Unspecified atrial fibrillation: Secondary | ICD-10-CM | POA: Diagnosis not present

## 2020-10-21 DIAGNOSIS — F0390 Unspecified dementia without behavioral disturbance: Secondary | ICD-10-CM | POA: Diagnosis not present

## 2020-10-21 DIAGNOSIS — E119 Type 2 diabetes mellitus without complications: Secondary | ICD-10-CM | POA: Diagnosis not present

## 2020-10-21 DIAGNOSIS — I2510 Atherosclerotic heart disease of native coronary artery without angina pectoris: Secondary | ICD-10-CM | POA: Diagnosis not present

## 2020-10-21 DIAGNOSIS — F05 Delirium due to known physiological condition: Secondary | ICD-10-CM | POA: Diagnosis not present

## 2020-10-21 DIAGNOSIS — I10 Essential (primary) hypertension: Secondary | ICD-10-CM | POA: Diagnosis not present

## 2020-10-23 ENCOUNTER — Other Ambulatory Visit: Payer: Self-pay

## 2020-10-23 ENCOUNTER — Encounter: Payer: Self-pay | Admitting: Psychology

## 2020-10-23 ENCOUNTER — Encounter: Payer: Medicare Other | Attending: Psychology | Admitting: Psychology

## 2020-10-23 DIAGNOSIS — G4733 Obstructive sleep apnea (adult) (pediatric): Secondary | ICD-10-CM | POA: Insufficient documentation

## 2020-10-23 DIAGNOSIS — I6789 Other cerebrovascular disease: Secondary | ICD-10-CM | POA: Insufficient documentation

## 2020-10-23 DIAGNOSIS — Z9989 Dependence on other enabling machines and devices: Secondary | ICD-10-CM | POA: Diagnosis not present

## 2020-10-23 DIAGNOSIS — R441 Visual hallucinations: Secondary | ICD-10-CM | POA: Insufficient documentation

## 2020-10-23 DIAGNOSIS — F09 Unspecified mental disorder due to known physiological condition: Secondary | ICD-10-CM | POA: Insufficient documentation

## 2020-10-23 DIAGNOSIS — F0391 Unspecified dementia with behavioral disturbance: Secondary | ICD-10-CM | POA: Diagnosis not present

## 2020-10-23 NOTE — Progress Notes (Signed)
   Neuropsychology Note  Vashaun Osmon completed 120 minutes of repeat neuropsychological testing with this provider. He ambulated with help of a walker. Resting tremor was noted (R>L). Speech was delayed and hypophonic; variable fluency. He wore corrective lenses and hearing aids. He was oriented to person, place, but not setting or aspects of time. He had trouble understanding instructions at times and required questions to be repeated and other forms of additional prompting.  The patient did not appear overtly distressed by the testing session, per behavioral observation or via self-report. Rest breaks were offered.      He admitted to experiencing visual hallucinations and vivid dreams at night. Described being visited somewhat regularly by "strange visiters" (e.g., sometimes dressed in "suites from the 40's"; he tries to talk with them but "they stay away". He has also seen a figure believed to be his father that "faded away" once he initiated contact.   Clinical Decision Making: We repeated most measures administered during his initial evaluation. Changes were made as deemed necessary based on patient performance on testing, my observations and additional pertinent factors such as those listed above.  Tests Administered:   Engineer, maintenance Drawing Test   Controlled Oral Word Association Test (COWAT)  Repeatable Battery for the Assessment of Neuropsychological Status, Update, Form B (RBANS-B)  Results:  To be included in final report.   Keionte Swicegood will return for an interactive feedback session with Dr. Sima Matas on (or before) 11/09/20 at which time his test performances, clinical impressions and treatment recommendations will be reviewed in detail. The patient understands he can contact our office should he require our assistance before this time.  Full report to follow.

## 2020-10-24 DIAGNOSIS — I2510 Atherosclerotic heart disease of native coronary artery without angina pectoris: Secondary | ICD-10-CM | POA: Diagnosis not present

## 2020-10-24 DIAGNOSIS — M109 Gout, unspecified: Secondary | ICD-10-CM | POA: Diagnosis not present

## 2020-10-24 DIAGNOSIS — I252 Old myocardial infarction: Secondary | ICD-10-CM | POA: Diagnosis not present

## 2020-10-24 DIAGNOSIS — I10 Essential (primary) hypertension: Secondary | ICD-10-CM | POA: Diagnosis not present

## 2020-10-24 DIAGNOSIS — Z7901 Long term (current) use of anticoagulants: Secondary | ICD-10-CM | POA: Diagnosis not present

## 2020-10-24 DIAGNOSIS — F05 Delirium due to known physiological condition: Secondary | ICD-10-CM | POA: Diagnosis not present

## 2020-10-24 DIAGNOSIS — E119 Type 2 diabetes mellitus without complications: Secondary | ICD-10-CM | POA: Diagnosis not present

## 2020-10-24 DIAGNOSIS — D6869 Other thrombophilia: Secondary | ICD-10-CM | POA: Diagnosis not present

## 2020-10-24 DIAGNOSIS — G4733 Obstructive sleep apnea (adult) (pediatric): Secondary | ICD-10-CM | POA: Diagnosis not present

## 2020-10-24 DIAGNOSIS — I4891 Unspecified atrial fibrillation: Secondary | ICD-10-CM | POA: Diagnosis not present

## 2020-10-24 DIAGNOSIS — F0390 Unspecified dementia without behavioral disturbance: Secondary | ICD-10-CM | POA: Diagnosis not present

## 2020-10-25 ENCOUNTER — Encounter: Payer: Medicare Other | Admitting: Psychology

## 2020-10-26 ENCOUNTER — Encounter (HOSPITAL_BASED_OUTPATIENT_CLINIC_OR_DEPARTMENT_OTHER): Payer: Medicare Other | Admitting: Psychology

## 2020-10-26 ENCOUNTER — Other Ambulatory Visit: Payer: Self-pay

## 2020-10-26 DIAGNOSIS — E119 Type 2 diabetes mellitus without complications: Secondary | ICD-10-CM | POA: Diagnosis not present

## 2020-10-26 DIAGNOSIS — F09 Unspecified mental disorder due to known physiological condition: Secondary | ICD-10-CM | POA: Diagnosis not present

## 2020-10-26 DIAGNOSIS — Z9989 Dependence on other enabling machines and devices: Secondary | ICD-10-CM | POA: Diagnosis not present

## 2020-10-26 DIAGNOSIS — F0391 Unspecified dementia with behavioral disturbance: Secondary | ICD-10-CM

## 2020-10-26 DIAGNOSIS — G4733 Obstructive sleep apnea (adult) (pediatric): Secondary | ICD-10-CM

## 2020-10-26 DIAGNOSIS — F05 Delirium due to known physiological condition: Secondary | ICD-10-CM | POA: Diagnosis not present

## 2020-10-26 DIAGNOSIS — F0390 Unspecified dementia without behavioral disturbance: Secondary | ICD-10-CM | POA: Diagnosis not present

## 2020-10-26 DIAGNOSIS — I2510 Atherosclerotic heart disease of native coronary artery without angina pectoris: Secondary | ICD-10-CM | POA: Diagnosis not present

## 2020-10-26 DIAGNOSIS — I10 Essential (primary) hypertension: Secondary | ICD-10-CM | POA: Diagnosis not present

## 2020-10-26 DIAGNOSIS — I4891 Unspecified atrial fibrillation: Secondary | ICD-10-CM | POA: Diagnosis not present

## 2020-10-26 DIAGNOSIS — R441 Visual hallucinations: Secondary | ICD-10-CM

## 2020-10-26 DIAGNOSIS — I6789 Other cerebrovascular disease: Secondary | ICD-10-CM | POA: Diagnosis not present

## 2020-10-31 DIAGNOSIS — E119 Type 2 diabetes mellitus without complications: Secondary | ICD-10-CM | POA: Diagnosis not present

## 2020-10-31 DIAGNOSIS — I10 Essential (primary) hypertension: Secondary | ICD-10-CM | POA: Diagnosis not present

## 2020-10-31 DIAGNOSIS — F05 Delirium due to known physiological condition: Secondary | ICD-10-CM | POA: Diagnosis not present

## 2020-10-31 DIAGNOSIS — I2510 Atherosclerotic heart disease of native coronary artery without angina pectoris: Secondary | ICD-10-CM | POA: Diagnosis not present

## 2020-10-31 DIAGNOSIS — I4891 Unspecified atrial fibrillation: Secondary | ICD-10-CM | POA: Diagnosis not present

## 2020-10-31 DIAGNOSIS — F0390 Unspecified dementia without behavioral disturbance: Secondary | ICD-10-CM | POA: Diagnosis not present

## 2020-11-01 ENCOUNTER — Encounter: Payer: Self-pay | Admitting: Psychology

## 2020-11-01 DIAGNOSIS — K219 Gastro-esophageal reflux disease without esophagitis: Secondary | ICD-10-CM | POA: Diagnosis not present

## 2020-11-01 DIAGNOSIS — I4891 Unspecified atrial fibrillation: Secondary | ICD-10-CM | POA: Diagnosis not present

## 2020-11-01 DIAGNOSIS — F0390 Unspecified dementia without behavioral disturbance: Secondary | ICD-10-CM | POA: Diagnosis not present

## 2020-11-01 DIAGNOSIS — I10 Essential (primary) hypertension: Secondary | ICD-10-CM | POA: Diagnosis not present

## 2020-11-01 DIAGNOSIS — I209 Angina pectoris, unspecified: Secondary | ICD-10-CM | POA: Diagnosis not present

## 2020-11-01 DIAGNOSIS — N400 Enlarged prostate without lower urinary tract symptoms: Secondary | ICD-10-CM | POA: Diagnosis not present

## 2020-11-01 DIAGNOSIS — F05 Delirium due to known physiological condition: Secondary | ICD-10-CM | POA: Diagnosis not present

## 2020-11-01 DIAGNOSIS — J40 Bronchitis, not specified as acute or chronic: Secondary | ICD-10-CM | POA: Diagnosis not present

## 2020-11-01 DIAGNOSIS — E785 Hyperlipidemia, unspecified: Secondary | ICD-10-CM | POA: Diagnosis not present

## 2020-11-01 DIAGNOSIS — I2510 Atherosclerotic heart disease of native coronary artery without angina pectoris: Secondary | ICD-10-CM | POA: Diagnosis not present

## 2020-11-01 DIAGNOSIS — E119 Type 2 diabetes mellitus without complications: Secondary | ICD-10-CM | POA: Diagnosis not present

## 2020-11-01 DIAGNOSIS — I25119 Atherosclerotic heart disease of native coronary artery with unspecified angina pectoris: Secondary | ICD-10-CM | POA: Diagnosis not present

## 2020-11-01 NOTE — Progress Notes (Addendum)
NEUROPSYCHOLOGICAL EVALUATION   Name:    Jonathan [name]  Date of Birth:   09-Sep-1931 Date of Interview:  09/20/20 Date of Testing:  10/23/20  Date of Feedback:  11/09/20     Background Information:  Reason for Referral:  Jonathan [name] is a 87 y.o. male originally referred by Dr. Rexene Alberts in 2021 to assess his level of cognitive functioning and assist in differential diagnosis. The patient is here for 12 month follow-up neuropsychological evaluation to reassess the patient regarding etiological factors for his cognitive decline and reassess treatment recommendations and refined diagnostic considerations. The current evaluation consisted of a review of available medical records, an interview with the patient and wife, and the completion of a neuropsychological testing battery. Informed consent was obtained.   History of Presenting Problem: Jonathan [name] is an 87 year old male referred bySaima Athar, MDfor neuropsychological evaluation due to progressing memory lossandmemory changes, changes in executive functioning, gait disturbance and balance issues, as well as the development of visual and some auditory hallucinations particularly at night. The patient has a history of underlying complex medical issues including hypertension, A. fib, coronary artery disease, arthritis, borderline diabetes, reflux disease, multiple prior surgeries including bilateral hip arthroplasties, hernia repair, cataract surgeries, corneal transplant, multiple EGDs and history of sleep apnea being treated with CPAP device. The patient and his wife report that they began noticing some symptoms approximately 5 years ago. The patient's wife reports that she first noticed the patient having difficulties with runny nose and drooling as well as tremors around 5 years ago. This was after he was hospitalized for a bowel obstruction. She reports that he has had difficulties ever since. They describe issues with memory  particularly short-term memory difficulties, changes in motor function and gait difficulties, difficulty with maintaining train of thought and concentration, and issues with executive functioning.  The patient had a fall in the past where he broke C3 but did not go into details about this injury. The patient has been dealing with hypertension since he was around age 18 and has been told that he had a "silent heart attack" and is also dealing with borderline diabetes. He does describe a brief time of amnesia around the time of his fall and over the night after his fall.  The patient himself reports that while long-term memory seems to be okay he is having difficulty with short-term memory although he reports that over time sometimes he is able to recall information. The patient's wife reports that the patient will often loses train of thoughts and cannot maintain attention and focus. 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 He was always able to solve issues quite easily. However, she reports that he is not able to do even basic types of repairs around the house and has trouble remembering how to execute various repairs. She reports that he is also having difficulties remembering recent events. The patient's wife reports a significant reduction in energy levels and activities and often sleeps not only at night but during the day as well. The patient has been diagnosed with obstructive sleep apnea and has used a CPAP device. They both report that he uses a CPAP device at night but does not use it when he takes naps during the day. He was encouraged to do so.  They both describe the development of visual and auditory hallucinations that happen more often  at night but can happen during the day. The patient and his wife both report that he often will see a child either hiding behind a chair or in  the room. He is aware that these are not real events but does clearly see or hear these hallucinations and again, they are often children in nature.  The patientand hiswife have had a significant reduction in their social interactions primarily due to COVID-19 as well as concerned that the patient will have other falls. He does keep a cane but does not always use it and does not use a cane around the house.  The patient had an MRI in 2018 with an impression of slightly abnormal MRI of brain showing age-appropriate changes of mild chronic microvascular ischemia and generalized atrophy. CT scan of the head conducted in 2019 showedno evidence of acute infarction, hemorrhage, hydrocephalus, or other mass lesion or mass-effect. There was cerebral volume loss with mild to moderate chronic vessel ischemia in the cerebral white matter.  The patient reports that his appetite is good but is excessivelydrooling when he is sleeping or has arunny nose during the day,causing him issues and problems. The patient is also generally dysphoric and is having trouble coping with his significant loss of function and the fact that he is unable to help out his kids,who live next door.  Note: See Initial neuropsychological consultation dated in EMR 11/23/2019 for additional background history etc.   During the follow-up clinical interview (09/20/20), the patient and his wife were present.  The patient reports that he has been having more difficulty with swallowing and has building up mucus and having symptoms consistent with esophageal spasms.  The patient also describes increasing problems with balance and great difficulty getting up and down from a chair.  The patient's wife reports that the patient has progressively gotten slower it is information processing time and needs more direct instruction and direction to accomplish activities.  She reports that his memory continues to be quite problematic and he is not able  to focus his attention and concentration.  The patient needs significant help with ADLs including taking showers and even has difficulty to the point of having difficulty turning over in his bed.  The patient is described as being in conversation and will suddenly "go away" from the conversation and closes eyes but not appear to be falling off to sleep.  The patient is described as having significant visual hallucinations at night and he will report seeing people at the foot of his bed and he will need or is supposed to help them out.  The patient is unable to be left alone because of significant cognitive deficits etc.  The patient his wife and medical records do not describe any particularly new medical information with the exception of the patient essentially giving up on attempts to use a CPAP device for diagnosed obstructive sleep apnea.  The patient was not able to ever get comfortable with using the mask and had difficulty with its proper fitting and use.  The patient's wife reports that they just went back to the doctor and stopped all efforts for CPAP use.  The patient continues to have significant issues with esophageal spasms and has trouble taking medications because of this.  The patient does have long-term care insurance that could potentially help with having someone coming to the house and assist the patient's wife in his care.  Medical History:  Past Medical History:  Diagnosis Date  . Acne rosacea   .  Arthritis   . Atrial fibrillation ([place])   . Borderline diabetes   . CAD (coronary artery disease)   . Diabetes mellitus without complication ([place])   . Diverticulosis   . Elevated PSA   . Esophageal stricture   . GERD (gastroesophageal reflux disease)   . History of nuclear stress test    Nuclear stress test 6/18: EF 59, small apical defect (?old MI); no ischemia; Low Risk  . Hypertension   . Myocardial infarction ([place])   . Neck fracture ([place]) 2015  . PONV (postoperative nausea and  vomiting) 08/09/2014  . Shoulder pain    Imaging:  The patient had an MRI in 2018 with an impression of slightly abnormal MRI of brain showing age-appropriate changes of mild chronic microvascular ischemia and generalized atrophy. CT scan of the head conducted in 2019 showedno evidence of acute infarction, hemorrhage, hydrocephalus, or other mass lesion or mass-effect. There was cerebral volume loss with mild to moderate chronic vessel ischemia in the cerebral white matter.   MRI in May, 2021 continued to show mild atrophy and microvascular disease but appeared stable when compared to 2018.   Current medications:  Outpatient Encounter Medications as of 10/26/2020  Medication Sig  . amLODipine (NORVASC) 5 MG tablet Take 5 mg by mouth daily. 1/2 tab (Patient not taking: Reported on 09/21/2020)  . apixaban (ELIQUIS) 5 MG TABS tablet Take 1 tablet (5 mg total) by mouth 2 (two) times daily.  Marland Kitchen atorvastatin (LIPITOR) 10 MG tablet Take 10 mg tablet by mouth Monday, Wednesday and friday  . colchicine 0.6 MG tablet TK 1 T PO Q 12 H PRF GOUT FLARE  . docusate sodium (COLACE) 100 MG capsule Take 200 mg by mouth at bedtime.  . donepezil (ARICEPT) 10 MG tablet Take 1 tablet (10 mg total) by mouth at bedtime.  . finasteride (PROSCAR) 5 MG tablet Take 5 mg by mouth every morning.   . halobetasol (ULTRAVATE) 0.05 % ointment Apply 1 application topically 2 (two) times daily as needed (itching or swelling). APPLY TWICE DAILY TO HANDS  . isosorbide mononitrate (IMDUR) 30 MG 24 hr tablet Take 30 mg by mouth daily with lunch.   . losartan (COZAAR) 25 MG tablet SMARTSIG:1 Tablet(s) By Mouth Every Evening  . nitroGLYCERIN (NITROSTAT) 0.4 MG SL tablet Place under the tongue.  Marland Kitchen omeprazole (PRILOSEC) 20 MG capsule Take 20 mg by mouth daily.  Marland Kitchen oxybutynin (DITROPAN) 5 MG tablet Take 2.5 mg by mouth 2 (two) times daily as needed. 1/2 tab  . risperiDONE (RISPERDAL) 1 MG tablet Take 1 mg by mouth at bedtime. (Patient  not taking: Reported on 09/21/2020)   No facility-administered encounter medications on file as of 10/26/2020.   Current Examination:  Behavioral Observations:  Jonathan [name] completed 120 minutes of repeat neuropsychological testing with this provider. He ambulated with help of a walker. Resting tremor was noted (R>L). Speech was delayed and hypophonic; variable fluency. He wore corrective lenses and hearing aids; still had trouble hearing. He was oriented to person and place, but not setting or time. He had trouble understanding instructions and required questions to be repeated often. Demonstration and practice items were also repeated multiple times due to slow processing and trouble hearing. The patient did not appear overtly distressed by the testing session, per behavioral observation or via self-report. Rest breaks were offered.      He continued to report visual hallucinations and vivid dreams at night. Described "strange visiters" that are often "dressed in suites from  the 40's"; he tries to talk with them but "they stay away". He has also seen a figure believed to be his father that "faded away" once he initiated contact.      Clinical Decision Making: We repeated most measures administered during his initial evaluation. Changes were made as deemed necessary based on patient performance on testing, my observations and additional pertinent factors such as those listed above.  Tests Administered:  Engineer, maintenance Drawing Test   Controlled Oral Word Association Test (COWAT)  Repeatable Battery for the Assessment of Neuropsychological Status, Update, Form B (RBANS-B)  Test Results: Note: Standardized scores are presented only for use by appropriately trained professionals and to allow for any future test-retest comparison. These scores should not be interpreted without consideration of all the information that is contained in the rest of the report. The most recent  standardization samples from the test publisher or other sources were used whenever possible to derive standard scores; scores were corrected for age, gender, ethnicity and education when available.   Test Scores: TEST SCORES:   Note: This summary of test scores accompanies the interpretive report and should not be considered in isolation without reference to the appropriate sections in the text. Descriptors are based on appropriate normative data and may be adjusted based on clinical judgment. The terms "impaired" and "within normal limits (WNL)" are used when a more specific level of functioning cannot be determined.      Validity Testing:     Descriptor           RBANS Effort Score: --- --- Within Expectation           Overall Cognitive Ability: Form B  Form A       Repeatable Battery for the Assessment of Neuropsychological Status, Update, Forms A & B (RBANS-A):   Standard Score/ Scaled Score (Percentile) 2022  Standard Score/ Scaled Score (Percentile) 2021 Descriptor 2022  Change? 2022-2021  Total Score 74 (4) 84 (14) Borderline Impaired Yes; Decline  Immediate Memory: 81 (10) 69 (2) Below Average Yes; Improvement  List Learning 3 (1) 5 (5) Exceptionally Low Mild Decline  Story Memory 10 (50) 4 (2) Average Improvement  Visuospatial/Constructional:  69 (2) 116 (86) Exceptionally Low Decline  Figure Copy  5 (5) 10 (50) Borderline Impaired Decline  Line Orientation Raw=10/20  (3-9) Raw=20/20 (>75) Borderline Impaired Decline  Language: 92 (30) 92 (30) Average No   Semantic Fluency 6 (9) 6 (9) Below Average No  Picture Naming (>75) (>75) Above Average No  Attention 79 (8) 82 (12) Borderline Impaired No  Coding 1 (<1) 2 (<1) Exceptionally Low Decline      Digit Span  12 (75) 12 (75) Above Average No  Delayed Memory  75 (5)  84 (14)  Borderline Impaired Decline  List Recall Raw=1/10 (10-16) Raw=3/10 (26-50) Below Average Decline  Story Recall 9 (37) 7 (16) Average Improvement   Figure Recall 6 (9) 7 (16) Below Average No  List Recognition  Raw=17/20 (10-16) Raw=18/20 (17-25) Below Average No         Description of Test Results:  Premorbid verbal intellectual abilities were estimated to have been within the average to high average range based on occupational and military history. He was administered the test of premorbid functioning during initial evaluation (11/2019) which produced a scaled score of 83 (13th percentile) and below average relative to a normative population.  However, this is a verbal based measure that likely underestimated his historical function  and may have also been impacted by presenting symptomatology.   Overall Cognitive Status:  The patient was administered the RBANS (Form B), which is a repeatable battery assessing a broad range of cognitive functioning areas with age-appropriate normative state in a format that can be repeated easily with multiple equivalent versions. The patient produced a total score index score of 74 which falls at the 4th percentile and well below average for his age..  This total composite score is significantly below predicted levels of functioning and lower than initial testing, where his total score of was 84 (14th percentile) and below average.    Language:  The patient produced a Language index score of 92 which falls in the average range relative to a normative population. He performed well on confrontational naming task but relatively weak on a semantic fluency task. Performance was highly consistent over time; obtained same scores during baseline testing. Overall the patient appears to be doing fairly well with regard to expressive language functioning.   Attention: The patient's RBANS (Form-B) Attention index score 79 falls at the 8th percentile and well below average. The patient did well on measures of pure auditory encoding measures and produced the same score as previous testing (75th percentile). However,  psychomotor processing speed was exceptionally low. His raw score on a coding task dropped significantly in the last year.   Visual-spatial/visual constructional abilities: The patient performed lower than expected on measures of visual-spatial and visual constructional abilities. He scored well below average on a figure copy measure and a line orientation test. This represents a decline compared to previous testing, where he performed average and above average on these tests, respectively. Performance on a clock drawing task was below expectation.   Learning & Memory The patient produced an Immediate Memory index score of 81 which falls at the 10th percentile and below average compared to peers. His delayed memory index score of 75 falls at the 5th percentile and well below average. More specifically, on a list-learning and memory task, he was able to recall 14/40 words across 4 trials, with performance falling in the 1st percentile. He showed a flat learning curve across 4 trials, recalling at best 5 words. Of note, no intrusion errors were recorded. After a delay, he could spontaneously recalled 1 word from the word list, which placed his performance in the below average range ocompared to age-matched peers. He appeared to benefit from a recognition trial, such that he correctly recalled 8/10 words from the list, with one false positive. His ability to recall contextual details of an auditory story was average for immediate recall and after a delay.  With regard to non-verbal memory, delayed free recall of a geometric figure was below average.  Compared to previous testing, his ability to encode, store, and retrieve contextual verbal information (short story read 2x) slightly improved whereas list-learning decreased, suggesting some variability across time. While the patient is generally efficient with regard to encoding abilities there are some initial difficulties transferring information into  short-term memory stores.  However, the information that is transferred into these memory stores is generally available for later recall with only mild deficits with regard to delayed memory both visual and auditorily.   Summary of Results: Overall, the results of repeat neuropsychological assessment suggest some variability over time with areas of both preserved function, relative impairment, and weakness. Language, basic auditory attention, and initial acquisition and consolidation of contextual verbal and basic visual material appear mostly stable. Information that is stored and  organized is typically available for later recall and does not deteriorate as a function of time. Impaired performance was noted in information processing speed, visual-spatial and constructional ability and focus execute abilities with regard to rote learning (e.g. word list) in contrast to his good encoding abilities. Spatial judgement was mildly impaired and appears to have declined in the last year. The patient has significant fine motor control deficits. Resting tremor was noted in dominant (right) hand. The patient continues to shows a significant degree of executive functioning deficits with difficulty shifting attention and adjusting to environmental cues, and deficits with regard to problem solving and effective development and execution of new strategies.  Clinical Impressions:  Overall, the results of the current neuropsychological evaluation (e.g., preserved performance on recognition trials during memory testing and language measures) in conjunction with previous testing and clinical data are not consistent with patterns typically seen with degenerative cortical dementia such as Alzheimer's disease. However, we cannot rule out the possibility of Lewy body dementia at this time, given reports of more autonomic dysfunction (e.g., dysphagia, esophageal spasms), fluctuating cognitive status, worsening motor symptoms  including parkinsonism (e.g.,  great difficulty getting up and down from a chair), and visual-spatial visouconstructional skills on objective testing. Other potential etiologies may include a combination of the sequelae stemming from the effects of cerebrovascular disease and multiple medical conditions (e.g. hypertension, diabetes, OSA, etc.) that are commonly associated more subcortical deficits.    The patient's wife reports that he has progressively gotten slower and requires more direct instruction and direction to accomplish routine (overlearned) activities.  She reports that his memory continues to be quite problematic and he is not able to focus his attention and concentration.  The patient needs significant help with ADLs including taking showers and even has difficulty to the point of having difficulty turning over in his bed. To more definitively differentiate between possible Lewy body versus ongoing deterioration from microvascular ischemic changes it would be appropriate to repeat testing in approximately 12 months to assess the type of an degree of progressive changes over time.    It will be imperative that the patient take care of basic nutritional and physical activity functions and maintain active vigilance over his early stages of type 2 diabetes.  Metabolic functions, lack of physical activity and inconsistencies in nutritional patterns likely will exacerbate his underlying microvascular ischemic changes.  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    Sinemet  and possibly zonisamide for DLB (if needed). Dose donepezil in the morning. There is mixed evidence for cognitive and neuropsychiatric symptom improvement with memantine but is a better option than antipsychotics; in this domain, 12.5 mg of Seroquel is the recommended starting dose, though this can worsen parkinsonism and cognition.    If agitation worsens, or begins to significantly affect daily life, then consider Nuedexta.    Given report of visual hallucinations and delusions, could consider an off label trial of Nuplazid.   Considering the noted medical history, the patient  is at increased risk for progression of cognitive impairment. Therefore, it is recommended that the patient aggressively manage any modifiable risk factors for further cognitive decline such as strict compliance with prescribed medical treatments for any cerebrovascular risk factors (e.g., high cholesterol, high blood pressure, sleep apnea, diabetes).    Due to the nature and severity of the symptoms noted during this evaluation, it is recommended that the patient remain under 24-hour care and supervision, as the cognitive deficits noted represent a safety risk if left alone for extended periods of time.  As long as a spouse or family member that can stay with the patient remains healthy, no changes are needed in living situation.     Continued assistance with certain activities of daily living (e.g., medication and financial management) is recommended.     Neuropsychological re-evaluation is recommended in approximately one year (or sooner) to monitor treatment efficacy, to assist with the management of the patient, to determine any clinical and functional significance of brain abnormality over time, as well as to document any potential improvement or decline in cognitive functioning. Lastly, any follow-up testing will help delineate the specific cognitive basis of any new functional complaints.   The following are  several strategies that may help:  . Performance will generally be best in a structured, routine, and familiar environment, as opposed to situations involving complex problems.  Marlene Lard a place to keep your keys, wallet, cell phone, and other personal belongings. . Take time to register and process information to be remembered. Deeper encoding of information can be gained by forming a mental picture, making meaningful associations, connecting new information to previously learned and related information, paraphrasing and repetition.  . To the extent possible, multitasking should be avoided; break down tasks into smaller steps to help get started and to keep from feeling overwhelmed. And if there are difficulties in organization and planning, maintaining a daily organizer to help keep track of important appointments and information may be beneficial.   . Memory problems may at least be minimally addressed using compensatory strategies such as the use of a daily schedule to follow, memos, portable recorder, a centrally located bulletin board, or memory notebook. A large calendar, placed in a highly visible location would be valuable to keep track of dates and appointments.  In addition, it would be helpful to keep a log of all of medical appointments with the name of the doctor, date of visit, diagnoses, and treatments.   . To aid in managing problems with attention, the patient may consider using some of the following strategies: o The patient should simplify tasks.  There may be a need to break overly complex activities into simple step-by-step tasks, keep these steps written down in a note book and then check them off as they are completed which will help to stay on task and make sure the whole task is finished.  o The patient should set deadlines for everything, even for seemingly small tasks, prioritize time-sensitive tasks and write down every assignment, message, or important thought. o The  patient is encouraged to use timers and alarms to stay on track and take breaks at regular intervals. Avoid piles of paperwork or procrastination by dealing with each item as it comes in.  Community resources are available regarding the care of adults with dementia. The Alzheimer's Association has a website that offers tips and recommendations to aide families in caring for family members with dementia (even for those without Alzheimer's disease). VerifiedMovies.de.  In  addition, a document that contains valuable resources for dementia care is provided.   Resources are available for those diagnosed with Lewy Body Dementia disease.  There website is as follows: SocialListing.com.br  Techniques of communicating with a person who has dementia/memory impairment:  . Maintain a regular schedule for as many activities as possible. . Practice reality orientation. . Repeat information quietly and firmly. Marland Kitchen Keep the voice low and calm and be rational and understanding. . Talk in a warm and encouraging manner. . Talk gently and calmly; smile and be relaxed. . Use normal voice tone, do not be condescending. . Talk in a quiet place without distraction. . Show respect and acceptance. . Use one sentence for each idea and check to see if the patient understands before proceeding. . Do not interrupt, signal acceptance of message by nods, or smiles when appropriate, and repeat what the patient has said when the message is complete. Marland Kitchen Keep stress, arguing, disagreements, and verbal tirades to a minimum, as any additional stressor on the patient will cause continued and more rapid deterioration.  In general, the best way to manage the behavioral and psychological symptoms of dementia such as agitation involves behavioral strategies such as using the three R's.  o Redirection (help distract your loved one by focusing their attention on something else, moving them to a new environment, or otherwise engaging them in  something other than what is distressing to them)  o Reassurance (reassure them that you are there to take care of them and that there is nothing they need to be worried about), and  o Reconsidering (consider the situation from their perspective and try to identify if there is something about the situation or environment that may be triggering their reaction).  Hallucinations are false perceptions of objects or events involving the senses. These false perceptions can be caused by changes within the brain that result from Alzheimer's, usually in the later stages of the disease, although they can also occur as a result of vision loss. The following strategies may be helpful in responding to the patient's hallucinations: o Offer reassurance - Respond in a calm, supportive manner. You may want to respond with, "Don't worry. I'm here. I'll protect you. I'll take care of you." - Gentle patting may turn the person's attention toward you and reduce the hallucination. - Acknowledge the feelings behind the hallucination and try to find out what the hallucination means to the individual. You might want to say, "It sounds as if you're worried" or "I know this is frightening for you." o Use distractions - Suggest a walk or move to another room. Frightening hallucinations often subside in well-lit areas where other people are present. - Try to turn the person's attention to music, conversation or activities you enjoy together. o Respond honestly - If the person asks you about a hallucination or delusion, be honest. For example, if he or she asks, "Do you see him?" you may want to answer with, "I know you see something, but I don't see it." This way, you're not denying what the person sees or hears, but you avoid an argument. o Modify the environment - Check for sounds that might be misinterpreted, such as noise from a television or an air conditioner. - Look for lighting that casts shadows, reflections or  distortions on the surfaces of floors, walls and furniture. Turn on lights to reduce shadows. - Cover mirrors with a cloth or remove them if the person thinks that he or she  is looking at a stranger.  Delusions (firmly held beliefs in things that are not real) may occur in middle-to-late-stage dementia. Confusion and memory loss -- such as the inability to remember certain people or objects -- can contribute to these untrue beliefs. A person with dementia may believe a family member is stealing his or her possessions or that he or she is being followed by the police. This kind of suspicious delusion is sometimes referred to as paranoia. Although not grounded in reality, the situation is very real to the person with dementia. Keep in mind that a person with dementia is trying to make sense of his or her world with declining cognitive function. A delusion is not the same thing as a hallucination. While delusions involve false beliefs, hallucinations are false perceptions of objects or events that are sensory in nature. The following strategies may be helpful in responding to delusions: o Don't take offense. Listen to what is troubling the person, and try to understand that reality. Then be reassuring, and let the person know you care. o Don't argue or try to convince. Allow the individual to express ideas. Acknowledge his or her opinions. o Offer a simple answer. Share your thoughts with the individual, but keep it simple. Don't overwhelm the person with lengthy explanations or reasons. o Switch the focus to another activity. Engage the individual in an activity, or ask for help with a chore. o Duplicate any lost items. If the person is often searching for a specific item, have several available (if possible). For example, if the individual is always looking for his or her wallet, purchase two of the same kind.  Feedback to Patient:  Jonathan [name] will return for a feedback appointment on 11/09/2020 to  review the results of his neuropsychological evaluation with this provider.   Thank you for your referral of Jonathan [name]. Please feel free to contact me if you have any questions or concerns regarding this report.

## 2020-11-02 DIAGNOSIS — F0390 Unspecified dementia without behavioral disturbance: Secondary | ICD-10-CM | POA: Diagnosis not present

## 2020-11-02 DIAGNOSIS — I2510 Atherosclerotic heart disease of native coronary artery without angina pectoris: Secondary | ICD-10-CM | POA: Diagnosis not present

## 2020-11-02 DIAGNOSIS — E119 Type 2 diabetes mellitus without complications: Secondary | ICD-10-CM | POA: Diagnosis not present

## 2020-11-02 DIAGNOSIS — I4891 Unspecified atrial fibrillation: Secondary | ICD-10-CM | POA: Diagnosis not present

## 2020-11-02 DIAGNOSIS — F05 Delirium due to known physiological condition: Secondary | ICD-10-CM | POA: Diagnosis not present

## 2020-11-02 DIAGNOSIS — I10 Essential (primary) hypertension: Secondary | ICD-10-CM | POA: Diagnosis not present

## 2020-11-07 DIAGNOSIS — F0390 Unspecified dementia without behavioral disturbance: Secondary | ICD-10-CM | POA: Diagnosis not present

## 2020-11-07 DIAGNOSIS — I10 Essential (primary) hypertension: Secondary | ICD-10-CM | POA: Diagnosis not present

## 2020-11-07 DIAGNOSIS — E119 Type 2 diabetes mellitus without complications: Secondary | ICD-10-CM | POA: Diagnosis not present

## 2020-11-07 DIAGNOSIS — I2510 Atherosclerotic heart disease of native coronary artery without angina pectoris: Secondary | ICD-10-CM | POA: Diagnosis not present

## 2020-11-07 DIAGNOSIS — F05 Delirium due to known physiological condition: Secondary | ICD-10-CM | POA: Diagnosis not present

## 2020-11-07 DIAGNOSIS — I4891 Unspecified atrial fibrillation: Secondary | ICD-10-CM | POA: Diagnosis not present

## 2020-11-08 ENCOUNTER — Encounter: Payer: Self-pay | Admitting: Cardiovascular Disease

## 2020-11-08 NOTE — Progress Notes (Signed)
Cardiology Office Note   Date:  11/08/2020   ID:  Jonathan [name], DOB [birth year], MRN [id number]  PCP:  Josetta Huddle, MD  Cardiologist:   Mertie Moores, MD   Chief Complaint  Patient presents with  . Atrial Fibrillation  . Coronary Artery Disease       . Hypertension    Problem list 1. Atypical chest pain 2. extreme fatigue  3. essential hypertension 4.  Atrial fibrillation  CHADS2VASC = 4   ( age 87, CAD, HTN) 5.  CAD -  6. Obstructive sleep apnea.     Jonathan [name] is a 87 y.o. male who presents for atypical CP and extreme fatigue. Feels worn out all of the time .  TSH was checked and was reportedly ok.  Had a cardiac cath 20 years ago Tamala Julian )  - has a chronic total occlusion that was treated medically  Had a 2nd cath about 7 years later that was unchanged.  Is retired from truck driving, Chartered loss adjuster, Estate agent.   Nov. 15, 2016:  Jonathan [name] was seen several months ago for some episodes of chest discomfort. He has known coronary artery disease. He had a [place] study which was normal.  Feb 14, 2017:  Jonathan [name] is seen back today for newly diagnosed atrial fib which was seen during an endoscopy and esophageal dilatation several days ago.  He is still in atrial fib - cannot tel that his HR is irregularly Has had generalized fatigue for the past year  .  He was seen for an esophageal dilatation several days ago. He was noted have an irregular heart rate in the monitor showed atrial fibrillation. He presents today for further evaluation.  No CP.   Still able to do his normal yard work  without any chest pain or shortness of breath.  Aug. 22, 2018:  Jonathan [name] is seen for follow up . Hx of CAD  I saw him in May. He has had paroxysmal atrial fibrillation. He was in sinus rhythm when he saw Richardson Dopp in June.  Event monitor showed 15 beats of NS VT knee was worked into see Pecolia Ades, nurse practitioner, he was complaining of dizziness. The Caduet  was stopped at that time and atorvastatin was started ( Amlodipine component DC'd)   He was seen in the ER on Aug. 10 for fatigue .   His HR was found to be in the 40-50 range - thought to be due to his metoprolol .    Metoprolol was stopped and Captopril was increased to 50 mg BID  Was feeling well Started feeling better yesterday  Feeling well today .    September 03, 2017: Jonathan [name] seems to be feeling a little bit better today.  We changed his captopril to losartan and he seems to be tolerating that fairly well. His blood pressure at home seems to be in the normal range. He is drinking a little bit more water.  Primary MD has changed his atorvastatin to 10 mg M,W,F  April 13, 2018: Jonathan [name] is seen today for follow-up of his atrial fibrillation, hypertension, and coronary artery disease. Was hospitalized recently with SBO. Has maintained NSR  Had a repeat sleep study.  Is found to have obstructive sleep apnea and now uses a CPAP machine.  Jan. 27 2021:  Jonathan [name] is seen today .  Has an old CTO ( I assume RCA )  No cp.   Still eats some salty foods.  Is not able to do  what he used to do due to generalized weakness.   Also loses his train of thougt  Jan. 27, 2022: Jonathan [name] is seen today he has a chronically occluded RCA ( we have assumed by review of records)  Hx of HTN, Afib -  CHADS2VASC = 4   ( age 45, CAD, HTN) HR is slow today  No hx of syncope or presyncope  Id having home PT  - seems to be helping    Past Medical History:  Diagnosis Date  . Acne rosacea   . Arthritis   . Atrial fibrillation ([place])   . Borderline diabetes   . CAD (coronary artery disease)   . Diabetes mellitus without complication ([place])   . Diverticulosis   . Elevated PSA   . Esophageal stricture   . GERD (gastroesophageal reflux disease)   . History of nuclear stress test    Nuclear stress test 6/18: EF 59, small apical defect (?old MI); no ischemia; Low Risk  . Hypertension   . Myocardial infarction  ([place])   . Neck fracture ([place]) 2015  . PONV (postoperative nausea and vomiting) 08/09/2014  . Shoulder pain     Past Surgical History:  Procedure Laterality Date  . BALLOON DILATION  04/02/2012   Procedure: BALLOON DILATION;  Surgeon: Arta Silence, MD;  Location: [facility] ENDOSCOPY;  Service: Endoscopy;  Laterality: N/A;  . BALLOON DILATION N/A 06/16/2013   Procedure: BALLOON DILATION;  Surgeon: Arta Silence, MD;  Location: [facility];  Service: Endoscopy;  Laterality: N/A;  . BALLOON DILATION N/A 11/16/2014   Procedure: BALLOON DILATION;  Surgeon: Arta Silence, MD;  Location: [facility];  Service: Endoscopy;  Laterality: N/A;  . BALLOON DILATION N/A 02/13/2017   Procedure: BALLOON DILATION;  Surgeon: Arta Silence, MD;  Location: [facility]COPY;  Service: Endoscopy;  Laterality: N/A;  . CORNEAL TRANSPLANT    . ESOPHAGOGASTRODUODENOSCOPY  04/02/2012   Procedure: ESOPHAGOGASTRODUODENOSCOPY (EGD);  Surgeon: Arta Silence, MD;  Location: [facility];  Service: Endoscopy;  Laterality: N/A;  . ESOPHAGOGASTRODUODENOSCOPY N/A 05/28/2013   Procedure: ESOPHAGOGASTRODUODENOSCOPY (EGD);  Surgeon: Cleotis Nipper, MD;  Location: [facility];  Service: Endoscopy;  Laterality: N/A;  . ESOPHAGOGASTRODUODENOSCOPY N/A 02/20/2015   Procedure: ESOPHAGOGASTRODUODENOSCOPY (EGD);  Surgeon: Arta Silence, MD;  Location: [facility];  Service: Endoscopy;  Laterality: N/A;  . ESOPHAGOGASTRODUODENOSCOPY (EGD) WITH PROPOFOL N/A 06/16/2013   Procedure: ESOPHAGOGASTRODUODENOSCOPY (EGD) WITH PROPOFOL;  Surgeon: Arta Silence, MD;  Location: [facility];  Service: Endoscopy;  Laterality: N/A;  . ESOPHAGOGASTRODUODENOSCOPY (EGD) WITH PROPOFOL N/A 11/16/2014   Procedure: ESOPHAGOGASTRODUODENOSCOPY (EGD) WITH PROPOFOL;  Surgeon: Arta Silence, MD;  Location: [facility];  Service: Endoscopy;  Laterality: N/A;  . ESOPHAGOGASTRODUODENOSCOPY (EGD) WITH PROPOFOL N/A 02/13/2017   Procedure: ESOPHAGOGASTRODUODENOSCOPY (EGD) WITH  PROPOFOL;  Surgeon: Arta Silence, MD;  Location: [facility];  Service: Endoscopy;  Laterality: N/A;  . EYE SURGERY     cataract sx  . HERNIA REPAIR     x 2  . TOTAL HIP ARTHROPLASTY     bilateral     Current Outpatient Medications  Medication Sig Dispense Refill  . amLODipine (NORVASC) 5 MG tablet Take 5 mg by mouth daily. 1/2 tab (Patient not taking: Reported on 09/21/2020)    . apixaban (ELIQUIS) 5 MG TABS tablet Take 1 tablet (5 mg total) by mouth 2 (two) times daily. 60 tablet 5  . atorvastatin (LIPITOR) 10 MG tablet Take 10 mg tablet by mouth Monday, Wednesday and friday    . colchicine 0.6 MG tablet TK 1  T PO Q 12 H PRF GOUT FLARE    . docusate sodium (COLACE) 100 MG capsule Take 200 mg by mouth at bedtime.    . donepezil (ARICEPT) 10 MG tablet Take 1 tablet (10 mg total) by mouth at bedtime. 90 tablet 3  . finasteride (PROSCAR) 5 MG tablet Take 5 mg by mouth every morning.     . halobetasol (ULTRAVATE) 0.05 % ointment Apply 1 application topically 2 (two) times daily as needed (itching or swelling). APPLY TWICE DAILY TO HANDS  0  . isosorbide mononitrate (IMDUR) 30 MG 24 hr tablet Take 30 mg by mouth daily with lunch.     . losartan (COZAAR) 25 MG tablet SMARTSIG:1 Tablet(s) By Mouth Every Evening    . nitroGLYCERIN (NITROSTAT) 0.4 MG SL tablet Place under the tongue.    Marland Kitchen omeprazole (PRILOSEC) 20 MG capsule Take 20 mg by mouth daily.    Marland Kitchen oxybutynin (DITROPAN) 5 MG tablet Take 2.5 mg by mouth 2 (two) times daily as needed. 1/2 tab    . risperiDONE (RISPERDAL) 1 MG tablet Take 1 mg by mouth at bedtime. (Patient not taking: Reported on 09/21/2020)     No current facility-administered medications for this visit.    Allergies:   Patient has no known allergies.    Social History:  The patient  reports that he has never smoked. He has never used smokeless tobacco. He reports that he does not drink alcohol and does not use drugs.   Family History:  The patient's Family history  is unknown by patient.    ROS:  Please see the history of present illness.     All other systems are reviewed and negative.    Physical Exam: There were no vitals taken for this visit.  GEN:  Elderly , frail  Man  HEENT: Normal NECK: No JVD; No carotid bruits LYMPHATICS: No lymphadenopathy CARDIAC:  irreg irreg. , soft systolic murmur  RESPIRATORY:  Clear to auscultation without rales, wheezing or rhonchi  ABDOMEN: Soft, non-tender, non-distended MUSCULOSKELETAL:  No edema; No deformity  SKIN: Warm and dry NEUROLOGIC:  Alert and oriented x 3    EKG:   Jan. 27, 2022:    Afib with slow V response.  HR is 52.    Recent Labs: 11/10/2019: BUN 18; Creatinine, Ser 1.04; Hemoglobin 13.6; Platelets 176; Potassium 4.2; Sodium 142    Lipid Panel No results found for: CHOL, TRIG, HDL, CHOLHDL, VLDL, LDLCALC, LDLDIRECT    Wt Readings from Last 3 Encounters:  09/21/20 163 lb (73.9 kg)  07/13/20 164 lb (74.4 kg)  01/12/20 171 lb (77.6 kg)      Other studies Reviewed: Additional studies/ records that were reviewed today include: . Review of the above records demonstrates:    ASSESSMENT AND PLAN:  1. Atrial fib:     Ventricular rate is slow  But he has not had any syncope or presyncope. I do not think a pacer is indicated.  Its becoming more more difficult for the wife to bring the patient to his office visits.  She has asked if Dr. Inda Merlin would be able to write the prescription for the Eliquis which would be fine with me.  That would greatly limit his doctors visits.  We will see him on an as-needed basis.     2.  HTN:     BP is well controlled.   3.  Fatigue :     4.  Coronary artery disease:   - no angina  Current medicines are reviewed at length with the patient today.  The patient does not have concerns regarding medicines.  The following changes have been made:  no change  Labs/ tests ordered today include:   No orders of the defined types were  placed in this encounter.   Disposition:   Will see PRN.   Mertie Moores, MD  11/08/2020 6:34 PM    [address], [place], [place]  [address] Phone: [phone]; Fax: [phone]

## 2020-11-09 ENCOUNTER — Encounter: Payer: Self-pay | Admitting: Cardiovascular Disease

## 2020-11-09 ENCOUNTER — Encounter (HOSPITAL_BASED_OUTPATIENT_CLINIC_OR_DEPARTMENT_OTHER): Payer: Medicare Other | Admitting: Psychology

## 2020-11-09 ENCOUNTER — Ambulatory Visit (INDEPENDENT_AMBULATORY_CARE_PROVIDER_SITE_OTHER): Payer: Medicare Other | Admitting: Cardiovascular Disease

## 2020-11-09 ENCOUNTER — Other Ambulatory Visit: Payer: Self-pay

## 2020-11-09 VITALS — BP 136/72 | HR 52 | Ht 66.0 in | Wt 159.0 lb

## 2020-11-09 DIAGNOSIS — F0391 Unspecified dementia with behavioral disturbance: Secondary | ICD-10-CM | POA: Diagnosis not present

## 2020-11-09 DIAGNOSIS — Z9989 Dependence on other enabling machines and devices: Secondary | ICD-10-CM | POA: Diagnosis not present

## 2020-11-09 DIAGNOSIS — I480 Paroxysmal atrial fibrillation: Secondary | ICD-10-CM

## 2020-11-09 DIAGNOSIS — G4733 Obstructive sleep apnea (adult) (pediatric): Secondary | ICD-10-CM | POA: Diagnosis not present

## 2020-11-09 DIAGNOSIS — R441 Visual hallucinations: Secondary | ICD-10-CM | POA: Diagnosis not present

## 2020-11-09 DIAGNOSIS — I2510 Atherosclerotic heart disease of native coronary artery without angina pectoris: Secondary | ICD-10-CM | POA: Diagnosis not present

## 2020-11-09 DIAGNOSIS — F09 Unspecified mental disorder due to known physiological condition: Secondary | ICD-10-CM | POA: Diagnosis not present

## 2020-11-09 DIAGNOSIS — I6789 Other cerebrovascular disease: Secondary | ICD-10-CM | POA: Diagnosis not present

## 2020-11-09 NOTE — Patient Instructions (Signed)
Medication Instructions:  Your physician recommends that you continue on your current medications as directed. Please refer to the Current Medication list given to you today.  *If you need a refill on your cardiac medications before your next appointment, please call your pharmacy*   Lab Work: None If you have labs (blood work) drawn today and your tests are completely normal, you will receive your results only by: Marland Kitchen MyChart Message (if you have MyChart) OR . A paper copy in the mail If you have any lab test that is abnormal or we need to change your treatment, we will call you to review the results.   Testing/Procedures: None   Follow-Up: At [facility], you and your health needs are our priority.  As part of our continuing mission to provide you with exceptional heart care, we have created designated Provider Care Teams.  These Care Teams include your primary Cardiologist (physician) and Advanced Practice Providers (APPs -  Physician Assistants and Nurse Practitioners) who all work together to provide you with the care you need, when you need it.  We recommend signing up for the patient portal called "MyChart".  Sign up information is provided on this After Visit Summary.  MyChart is used to connect with patients for Virtual Visits (Telemedicine).  Patients are able to view lab/test results, encounter notes, upcoming appointments, etc.  Non-urgent messages can be sent to your provider as well.   To learn more about what you can do with MyChart, go to [email or web address].    Your next appointment:   As needed  The format for your next appointment:   In Person  Provider:   You may see Mertie Moores, MD or one of the following Advanced Practice Providers on your designated Care Team:    Richardson Dopp, PA-C  Robbie Lis, [place]    Other Instructions

## 2020-11-09 NOTE — Progress Notes (Signed)
11/09/2020: 3 PM to 4 PM: Today's visit was an in person visit that was conducted in my outpatient clinic office with the patient, his wife and myself present.  Today I provided feedback regarding the results of the repeat neuropsychological testing with results being able to be found in their entirety in his EMR dated 10/26/2020.  While initially, the patient was diagnosed with a vascular dementia follow-up testing and further clinical data more strongly suggest the most likely diagnostic consideration is 1 of probable Lewy body with some behavioral disturbance.  The patient is having more visual hallucinations that are not delusional in nature, observed tremor particularly with his right hand and today patient displayed some dropfoot type symptoms that he stated were new in nature.  On his exit from my office the symptoms were not present but they were clearly present when observing him going from the waiting room to my office.  The patient was oriented and was able to participate in that he discussion actively but there were times when it was clear episodes of confusion.  Expressive and receptive language functions appear to be intact.  I went over various recommendations from my evaluation along with Dr. Darol Destine and we reviewed the testing results and diagnostic considerations.  I will include the summary of the results and diagnostic considerations from the 10/26/2018 note for convenience below.  The patient and his wife appeared to understand what the diagnostic considerations were for.  The patient has a follow-up appointment with Debbora Presto, NP scheduled for 12/28/2018 with [facility].    Summary of Results: Overall, the results of repeat neuropsychological assessment suggest some variability over time with areas of both preserved function, relative impairment, and weakness. Language, basic auditory attention, and initial acquisition and consolidation of contextual verbal and basic  visual material appear mostly stable. Information that is stored and organized is typically available for later recall and does not deteriorate as a function of time. Impaired performance was noted in information processing speed, visual-spatial and constructional ability and focus execute abilities with regard to rote learning (e.g. word list) in contrast to his good encoding abilities. Spatial judgement was mildly impaired and appears to have declined in the last year. The patient has significant fine motor control deficits. Resting tremor was noted in dominant (right) hand. The patient continues to shows a significant degree of executive functioning deficits with difficulty shifting attention and adjusting to environmental cues, and deficits with regard to problem solving and effective development and execution of new strategies.  Clinical Impressions:  Overall, the results of the current neuropsychological evaluation (e.g., preserved performance on recognition trials during memory testing and language measures) in conjunction with previous testing and clinical data are not consistent with patterns typically seen with degenerative cortical dementia such as Alzheimer's disease. However, we cannot rule out the possibility of Lewy body dementia at this time, given reports of more autonomic dysfunction (e.g., dysphagia, esophageal spasms), fluctuating cognitive status, worsening motor symptoms including parkinsonism (e.g.,  great difficulty getting up and down from a chair), and visual-spatial visouconstructional skills on objective testing. Other potential etiologies may include a combination of the sequelae stemming from the effects of cerebrovascular disease and multiple medical conditions (e.g. hypertension, diabetes, OSA, etc.) that are commonly associated more subcortical deficits.    The patient's wife reports that he has progressively gotten slower and requires more direct instruction and direction to  accomplish routine (overlearned) activities. She reports that his memory continues to be quite problematic and he  is not able to focus his attention and concentration. The patient needs significant help with ADLs including taking showers and even has difficulty to the point of having difficulty turning over in his bed. To more definitively differentiate between possible Lewy body versus ongoing deterioration from microvascular ischemic changes it would be appropriate to repeat testing in approximately 12 months to assess the type of an degree of progressive changes over time.    It will be imperative that the patient take care of basic nutritional and physical activity functions and maintain active vigilance over his early stages of type 2 diabetes. Metabolic functions, lack of physical activity and inconsistencies in nutritional patterns likely will exacerbate his underlying microvascular ischemic changes.  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

## 2020-11-15 DIAGNOSIS — I10 Essential (primary) hypertension: Secondary | ICD-10-CM | POA: Diagnosis not present

## 2020-11-15 DIAGNOSIS — F0390 Unspecified dementia without behavioral disturbance: Secondary | ICD-10-CM | POA: Diagnosis not present

## 2020-11-15 DIAGNOSIS — F05 Delirium due to known physiological condition: Secondary | ICD-10-CM | POA: Diagnosis not present

## 2020-11-15 DIAGNOSIS — I2510 Atherosclerotic heart disease of native coronary artery without angina pectoris: Secondary | ICD-10-CM | POA: Diagnosis not present

## 2020-11-15 DIAGNOSIS — I4891 Unspecified atrial fibrillation: Secondary | ICD-10-CM | POA: Diagnosis not present

## 2020-11-15 DIAGNOSIS — E119 Type 2 diabetes mellitus without complications: Secondary | ICD-10-CM | POA: Diagnosis not present

## 2020-11-22 DIAGNOSIS — F05 Delirium due to known physiological condition: Secondary | ICD-10-CM | POA: Diagnosis not present

## 2020-11-22 DIAGNOSIS — I10 Essential (primary) hypertension: Secondary | ICD-10-CM | POA: Diagnosis not present

## 2020-11-22 DIAGNOSIS — J40 Bronchitis, not specified as acute or chronic: Secondary | ICD-10-CM | POA: Diagnosis not present

## 2020-11-22 DIAGNOSIS — I2510 Atherosclerotic heart disease of native coronary artery without angina pectoris: Secondary | ICD-10-CM | POA: Diagnosis not present

## 2020-11-22 DIAGNOSIS — F0390 Unspecified dementia without behavioral disturbance: Secondary | ICD-10-CM | POA: Diagnosis not present

## 2020-11-22 DIAGNOSIS — E785 Hyperlipidemia, unspecified: Secondary | ICD-10-CM | POA: Diagnosis not present

## 2020-11-22 DIAGNOSIS — N400 Enlarged prostate without lower urinary tract symptoms: Secondary | ICD-10-CM | POA: Diagnosis not present

## 2020-11-22 DIAGNOSIS — K219 Gastro-esophageal reflux disease without esophagitis: Secondary | ICD-10-CM | POA: Diagnosis not present

## 2020-11-22 DIAGNOSIS — I209 Angina pectoris, unspecified: Secondary | ICD-10-CM | POA: Diagnosis not present

## 2020-11-22 DIAGNOSIS — E119 Type 2 diabetes mellitus without complications: Secondary | ICD-10-CM | POA: Diagnosis not present

## 2020-11-22 DIAGNOSIS — I4891 Unspecified atrial fibrillation: Secondary | ICD-10-CM | POA: Diagnosis not present

## 2020-11-22 DIAGNOSIS — I25119 Atherosclerotic heart disease of native coronary artery with unspecified angina pectoris: Secondary | ICD-10-CM | POA: Diagnosis not present

## 2020-11-24 ENCOUNTER — Telehealth: Payer: Self-pay | Admitting: Neurology

## 2020-11-24 NOTE — Telephone Encounter (Signed)
Pt's wife would like to speak to the RN about possibly ordering a scan for the pt. Please advise.

## 2020-11-27 NOTE — Telephone Encounter (Signed)
Noted and will be happy to assist patient and family at follow up.

## 2020-11-27 NOTE — Telephone Encounter (Signed)
I called patient's wife back, and reviewed Dr. Guadelupe Sabin recommendation.  Patient's wife is agreeable to keeping follow-up as scheduled for March to further discuss DAT scan.  Patient's wife had no further questions/concerns, but will call back if concerns come up.

## 2020-11-27 NOTE — Telephone Encounter (Signed)
I have reached out to patient wife and discussed message.  She wanted to know if Dr. Rexene Alberts would be willing to order the recommended imaging studies based of the recent neuropsych evaluation? Below is what the provider had recommended. See office note from 1/13 and 1/27 for reference.  While the patient had a recent MRI that showed some generalized atrophy, other types of imaging, such as PET or SPECT scans may be useful to measure activity in the brain by monitoring blood flow and glucose/oxygen usage.   Patient has an upcoming appointment with Debbora Presto, NP on 12/27/2020, and patient's wife is really hoping we can have the imaging study completed before the appointment so results can be discussed at that time.  She also reports the patient is difficult to get out of the house and it would be easier to have the imaging already done prior to his follow-up.Marland Kitchen

## 2020-11-27 NOTE — Telephone Encounter (Signed)
There are different types of nuclear medicine scans available in neurology. Some scans are designed to help with the diagnosis of underlying Alzheimer's dementia. Some scans are more geared towards parkinsonism. Since Dr. Sima Matas had some concern for underlying parkinsonism and the possibility of Lewy body dementia, I think we need to talk about doing a DaTscan. I would like for [place] to address this during the visit that is coming up in a month with Amy and she can order the DaTscan at the time, it does require a consent and it would be worth doing this at the time of the visit and ordering the scan also at the time of the visit rather than before.  Please call the patient's wife back, I am also copying [place] and Amy.

## 2020-11-29 ENCOUNTER — Ambulatory Visit (INDEPENDENT_AMBULATORY_CARE_PROVIDER_SITE_OTHER): Payer: Medicare Other | Admitting: Podiatry

## 2020-11-29 ENCOUNTER — Other Ambulatory Visit: Payer: Self-pay

## 2020-11-29 ENCOUNTER — Encounter: Payer: Self-pay | Admitting: Podiatry

## 2020-11-29 DIAGNOSIS — B351 Tinea unguium: Secondary | ICD-10-CM

## 2020-11-29 DIAGNOSIS — M79676 Pain in unspecified toe(s): Secondary | ICD-10-CM | POA: Diagnosis not present

## 2020-11-29 DIAGNOSIS — E119 Type 2 diabetes mellitus without complications: Secondary | ICD-10-CM | POA: Diagnosis not present

## 2020-11-29 DIAGNOSIS — D689 Coagulation defect, unspecified: Secondary | ICD-10-CM

## 2020-11-29 NOTE — Progress Notes (Signed)
This patient returns to my office for at risk foot care.  This patient requires this care by a professional since this patient will be at risk due to having coagulation defect and diabetes.  Patient is taking eliquiss.  This patient is unable to cut nails himself since the patient cannot reach his nails.These nails are painful walking and wearing shoes.  This patient presents for at risk foot care today.  Patient presents to the office with his wife.  General Appearance  Alert, conversant and in no acute stress.  Vascular  Dorsalis pedis and posterior tibial  pulses are weakly palpable  bilaterally.  Capillary return is within normal limits  bilaterally. Temperature is within normal limits  bilaterally.  Neurologic  Senn-Weinstein monofilament wire test within normal limits  bilaterally. Muscle power within normal limits bilaterally.  Nails Thick disfigured discolored nails with subungual debris  from hallux to fifth toes bilaterally. No evidence of bacterial infection or drainage bilaterally.  Orthopedic  No limitations of motion  feet .  No crepitus or effusions noted.  No bony pathology or digital deformities noted.  Skin  normotropic skin with no porokeratosis noted bilaterally.  No signs of infections or ulcers noted.     Onychomycosis  Pain in right toes  Pain in left toes  Consent was obtained for treatment procedures.   Mechanical debridement of nails 1-5  bilaterally performed with a nail nipper.  Filed with dremel without incident.    Return office visit     3 months                 Told patient to return for periodic foot care and evaluation due to potential at risk complications.   Labria Wos DPM  

## 2020-12-06 DIAGNOSIS — L720 Epidermal cyst: Secondary | ICD-10-CM | POA: Diagnosis not present

## 2020-12-06 DIAGNOSIS — L814 Other melanin hyperpigmentation: Secondary | ICD-10-CM | POA: Diagnosis not present

## 2020-12-06 DIAGNOSIS — Z85828 Personal history of other malignant neoplasm of skin: Secondary | ICD-10-CM | POA: Diagnosis not present

## 2020-12-06 DIAGNOSIS — L821 Other seborrheic keratosis: Secondary | ICD-10-CM | POA: Diagnosis not present

## 2020-12-06 DIAGNOSIS — D225 Melanocytic nevi of trunk: Secondary | ICD-10-CM | POA: Diagnosis not present

## 2020-12-12 ENCOUNTER — Ambulatory Visit: Payer: Medicare Other | Admitting: Psychology

## 2020-12-21 ENCOUNTER — Telehealth: Payer: Self-pay | Admitting: Cardiovascular Disease

## 2020-12-21 DIAGNOSIS — I10 Essential (primary) hypertension: Secondary | ICD-10-CM

## 2020-12-21 DIAGNOSIS — I2510 Atherosclerotic heart disease of native coronary artery without angina pectoris: Secondary | ICD-10-CM

## 2020-12-21 MED ORDER — APIXABAN 5 MG PO TABS: 5.0000 mg | ORAL_TABLET | Freq: Two times a day (BID) | ORAL | 3 refills | Status: DC

## 2020-12-21 NOTE — Telephone Encounter (Signed)
*  STAT* If patient is at the pharmacy, call can be transferred to refill team.   1. Which medications need to be refilled? (please list name of each medication and dose if known) apixaban (ELIQUIS) 5 MG TABS tablet  2. Which pharmacy/location (including street and city if local pharmacy) is medication to be sent to? [facility], [place] AT [address]  3. Do they need a 30 day or 90 day supply? 30 day supply    Pt only has two tablets left.

## 2020-12-21 NOTE — Telephone Encounter (Signed)
Prescription refill request for Eliquis received. Indication:afib Last office visit:11/09/20 Scr:1.2 Age: 87 Weight:72kg

## 2020-12-22 DIAGNOSIS — I2510 Atherosclerotic heart disease of native coronary artery without angina pectoris: Secondary | ICD-10-CM | POA: Diagnosis not present

## 2020-12-22 DIAGNOSIS — E785 Hyperlipidemia, unspecified: Secondary | ICD-10-CM | POA: Diagnosis not present

## 2020-12-22 DIAGNOSIS — K219 Gastro-esophageal reflux disease without esophagitis: Secondary | ICD-10-CM | POA: Diagnosis not present

## 2020-12-22 DIAGNOSIS — I10 Essential (primary) hypertension: Secondary | ICD-10-CM | POA: Diagnosis not present

## 2020-12-22 DIAGNOSIS — I252 Old myocardial infarction: Secondary | ICD-10-CM | POA: Diagnosis not present

## 2020-12-22 DIAGNOSIS — N400 Enlarged prostate without lower urinary tract symptoms: Secondary | ICD-10-CM | POA: Diagnosis not present

## 2020-12-22 DIAGNOSIS — I209 Angina pectoris, unspecified: Secondary | ICD-10-CM | POA: Diagnosis not present

## 2020-12-22 DIAGNOSIS — F05 Delirium due to known physiological condition: Secondary | ICD-10-CM | POA: Diagnosis not present

## 2020-12-22 DIAGNOSIS — F0390 Unspecified dementia without behavioral disturbance: Secondary | ICD-10-CM | POA: Diagnosis not present

## 2020-12-22 DIAGNOSIS — E119 Type 2 diabetes mellitus without complications: Secondary | ICD-10-CM | POA: Diagnosis not present

## 2020-12-22 DIAGNOSIS — I4891 Unspecified atrial fibrillation: Secondary | ICD-10-CM | POA: Diagnosis not present

## 2020-12-27 ENCOUNTER — Encounter: Payer: Self-pay | Admitting: Family Medicine

## 2020-12-27 ENCOUNTER — Ambulatory Visit (INDEPENDENT_AMBULATORY_CARE_PROVIDER_SITE_OTHER): Payer: Medicare Other | Admitting: Family Medicine

## 2020-12-27 VITALS — BP 156/87 | HR 64 | Ht 67.0 in | Wt 163.0 lb

## 2020-12-27 DIAGNOSIS — F0390 Unspecified dementia without behavioral disturbance: Secondary | ICD-10-CM | POA: Diagnosis not present
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
## 2020-12-27 NOTE — Patient Instructions (Signed)
Below is our plan:  We will continue to monitor symptoms. Continue Aricept as directed by PCP. Consider Namenda as discussed.   Please make sure you are staying well hydrated. I recommend 50-60 ounces daily. Well balanced diet and regular exercise encouraged. Consistent sleep schedule with 6-8 hours recommended.   Please continue follow up with care team as directed.   Follow up with me pending conversation with Dr Inda Merlin. If you would like to start Namenda, please let me know. If not, you may continue follow up with Dr Redmond Baseman.   You may receive a survey regarding today's visit. I encourage you to leave honest feed back as I do use this information to improve patient care. Thank you for seeing me today!       Memantine Tablets What is this medicine? MEMANTINE (MEM an teen) is used to treat dementia caused by Alzheimer's disease. This medicine may be used for other purposes; ask your health care provider or pharmacist if you have questions. COMMON BRAND NAME(S): Namenda What should I tell my health care provider before I take this medicine? They need to know if you have any of these conditions:  difficulty passing urine  kidney disease  liver disease  seizures  an unusual or allergic reaction to memantine, other medicines, foods, dyes, or preservatives  pregnant or trying to get pregnant  breast-feeding How should I use this medicine? Take this medicine by mouth with a glass of water. Follow the directions on the prescription label. You may take this medicine with or without food. Take your doses at regular intervals. Do not take your medicine more often than directed. Continue to take your medicine even if you feel better. Do not stop taking except on the advice of your doctor or health care professional. Talk to your pediatrician regarding the use of this medicine in children. Special care may be needed. Overdosage: If you think you have taken too much of this medicine  contact a poison control center or emergency room at once. NOTE: This medicine is only for you. Do not share this medicine with others. What if I miss a dose? If you miss a dose, take it as soon as you can. If it is almost time for your next dose, take only that dose. Do not take double or extra doses. If you do not take your medicine for several days, contact your health care provider. Your dose may need to be changed. What may interact with this medicine?  acetazolamide  amantadine  cimetidine  dextromethorphan  dofetilide  hydrochlorothiazide  ketamine  metformin  methazolamide  quinidine  ranitidine  sodium bicarbonate  triamterene This list may not describe all possible interactions. Give your health care provider a list of all the medicines, herbs, non-prescription drugs, or dietary supplements you use. Also tell them if you smoke, drink alcohol, or use illegal drugs. Some items may interact with your medicine. What should I watch for while using this medicine? Visit your doctor or health care professional for regular checks on your progress. Check with your doctor or health care professional if there is no improvement in your symptoms or if they get worse. You may get drowsy or dizzy. Do not drive, use machinery, or do anything that needs mental alertness until you know how this drug affects you. Do not stand or sit up quickly, especially if you are an older patient. This reduces the risk of dizzy or fainting spells. Alcohol can make you more drowsy and dizzy.  Avoid alcoholic drinks. What side effects may I notice from receiving this medicine? Side effects that you should report to your doctor or health care professional as soon as possible:  allergic reactions like skin rash, itching or hives, swelling of the face, lips, or tongue  agitation or a feeling of restlessness  depressed mood  dizziness  hallucinations  redness, blistering, peeling or loosening of the  skin, including inside the mouth  seizures  vomiting Side effects that usually do not require medical attention (report to your doctor or health care professional if they continue or are bothersome):  constipation  diarrhea  headache  nausea  trouble sleeping This list may not describe all possible side effects. Call your doctor for medical advice about side effects. You may report side effects to FDA at 1-800-FDA-1088. Where should I keep my medicine? Keep out of the reach of children. Store at room temperature between 15 degrees and 30 degrees C (59 degrees and 86 degrees F). Throw away any unused medicine after the expiration date. NOTE: This sheet is a summary. It may not cover all possible information. If you have questions about this medicine, talk to your doctor, pharmacist, or health care provider.  2021 Elsevier/Gold Standard (2013-07-19 14:10:42)   Lewy Body Dementia Lewy body dementia, also called dementia with Lewy bodies, is a condition that affects the way the brain functions. It is one form of dementia. In this condition, proteins called Lewy bodies build up in certain areas of the brain. This causes problems with:  Memory.  Decision making.  Behavior.  Speaking.  Thinking and problem solving.  Movements and balance. This condition is progressive, which means that it gets worse with time (is degenerative) and cannot be reversed. What are the causes? This condition is caused by the buildup of Lewy bodies in brain cells in areas of the brain that control memory, thinking, and movement. It is not known what causes the Lewy bodies to build up. What increases the risk? You are more likely to develop this condition if you:  Have a family history of Lewy body dementia or Parkinson's disease.  Are 79 years old or older.  Are male. What are the signs or symptoms? Symptoms of this condition may include:  Seeing things that are not there  (hallucinating).  Sleep problems, such as acting out dreams while you are asleep.  Changes in memory, attention, and concentration that come and go (fluctuate).  Symptoms of dementia, such as: ? Trouble with memory. ? Trouble paying attention. ? Problems with planning and organizing. ? Problems with judgment. ? Behavioral problems.  Symptoms of Parkinson's disease, such as: ? Shaking movements that you cannot control (tremor). Tremors usually start in a hand or foot when you are resting (resting tremor). ? Stooped posture. ? Slowing of movement. ? Stiff muscles (rigidity). ? Loss of balance and stability when standing. How is this diagnosed? This condition is diagnosed by a specialist who diagnoses and treats this condition (neurologist). Your health care provider will talk with you and your family, friends, or caregivers about your history and symptoms. A thorough medical history will be taken, and you will have a physical exam and tests. Tests may include:  Lab tests, such as blood or urine tests.  Imaging tests, such as a CT scan, a PET scan, or an MRI.  A test that involves removing and testing a small amount of the fluid that surrounds the brain and spinal cord (lumbar puncture).  Tests that evaluate  brain function, such as memory tests, cognitive tests, and neuropsychological tests. How is this treated? There is no cure for this condition. Treatment focuses on managing your symptoms. Treatment may include:  Medicines to help with hallucinations, sleep, and behavioral problems.  Speech, occupational, and physical therapy. Your health care provider can help direct you to support groups, organizations, and other health care providers who can help with decisions about your care. Follow these instructions at home: Medicines  Take over-the-counter and prescription medicines only as told by your health care provider.  To help you manage your medicines, use a pill organizer or  pill reminder.  Avoid taking medicines that can affect thinking, such as pain medicines or certain sleeping medicines. Always check with your health care provider before taking any new medicines. Lifestyle  Make healthy lifestyle choices: ? Be physically active as told by your health care provider. ? Do not use any products that contain nicotine or tobacco, such as cigarettes, e-cigarettes, and chewing tobacco. If you need help quitting, ask your health care provider. ? Try to practice stress-management techniques when you experience stress, such as mindfulness, yoga, or deep breathing. ? Stay socially connected. Talk regularly with other people, such as family, friends, and neighbors.  Make sure you sleep well. These tips can help you get a good night's rest: ? Avoid napping during the day. ? Keep your sleeping area dark and cool. ? Avoid exercising a few hours before you go to bed. ? Avoid caffeine products in the evening. Eating and drinking  Do not drink alcohol.  Drink enough fluid to keep your urine pale yellow.  Eat a healthy diet. Safety  Work with your health care provider to determine what you need help with and what your safety needs are.  If you have trouble moving around, use a cane or walker as told by your health care provider.  Make sure your home environment is safe. To do this: ? Remove things that can be a tripping hazard, such as throw rugs or clutter. ? Install grab bars and railings in your home to prevent falls.  Talk with your health care provider about whether it is safe for you to drive.  If you were given a bracelet that identifies you as a person with memory loss or tracks your location, make sure to wear it at all times.   General instructions  Work with your family to make important decisions, such as advance directives, medical power of attorney, or a living will.  Keep all follow-up visits. This is important.   Where to find more  information  [employer] of Neurological Disorders and Stroke: MasterBox[employer].it Contact a health care provider if you have:  New or worsening confusion.  New or worsening trouble with sleeping or increased daytime sleepiness.  Problems with choking or swallowing. Get help right away if:  You feel depressed or sad, or feel that you want to harm yourself.  Your family members become concerned for your safety. If you ever feel like you may hurt yourself or others, or have thoughts about taking your own life, get help right away. Go to your nearest emergency department or:  Call your local emergency services (911 in the [place]  Call a suicide crisis helpline, such as the [facility] at 228-176-9603. This is open 24 hours a day in the [place]  Text the Crisis Text Line at 954-883-9663 (in the [place].). Summary  Lewy body dementia is a condition that affects the  way the brain functions. It causes problems with functions such as memory and thinking.  Lewy body dementia gets worse with time.  This condition is caused by the buildup of proteins called Lewy bodies in brain cells. It is not known what causes the Lewy bodies to build up.  Work with your health care provider to determine what you need help with and what your safety needs are.  Your health care provider can help direct you to support groups, organizations, and other health care providers who can help with decisions about your care. This information is not intended to replace advice given to you by your health care provider. Make sure you discuss any questions you have with your health care provider. Document Revised: 02/14/2020 Document Reviewed: 02/14/2020 Elsevier Patient Education  [place].

## 2020-12-28 DIAGNOSIS — L299 Pruritus, unspecified: Secondary | ICD-10-CM | POA: Diagnosis not present

## 2020-12-28 DIAGNOSIS — I10 Essential (primary) hypertension: Secondary | ICD-10-CM | POA: Diagnosis not present

## 2020-12-28 DIAGNOSIS — N400 Enlarged prostate without lower urinary tract symptoms: Secondary | ICD-10-CM | POA: Diagnosis not present

## 2020-12-28 DIAGNOSIS — K219 Gastro-esophageal reflux disease without esophagitis: Secondary | ICD-10-CM | POA: Diagnosis not present

## 2020-12-28 DIAGNOSIS — E1169 Type 2 diabetes mellitus with other specified complication: Secondary | ICD-10-CM | POA: Diagnosis not present

## 2020-12-28 DIAGNOSIS — Z1389 Encounter for screening for other disorder: Secondary | ICD-10-CM | POA: Diagnosis not present

## 2020-12-28 DIAGNOSIS — Z0000 Encounter for general adult medical examination without abnormal findings: Secondary | ICD-10-CM | POA: Diagnosis not present

## 2020-12-28 DIAGNOSIS — I252 Old myocardial infarction: Secondary | ICD-10-CM | POA: Diagnosis not present

## 2020-12-28 DIAGNOSIS — I25119 Atherosclerotic heart disease of native coronary artery with unspecified angina pectoris: Secondary | ICD-10-CM | POA: Diagnosis not present

## 2020-12-28 DIAGNOSIS — I4891 Unspecified atrial fibrillation: Secondary | ICD-10-CM | POA: Diagnosis not present

## 2020-12-28 DIAGNOSIS — F05 Delirium due to known physiological condition: Secondary | ICD-10-CM | POA: Diagnosis not present

## 2020-12-28 DIAGNOSIS — F0390 Unspecified dementia without behavioral disturbance: Secondary | ICD-10-CM | POA: Diagnosis not present

## 2020-12-28 DIAGNOSIS — E785 Hyperlipidemia, unspecified: Secondary | ICD-10-CM | POA: Diagnosis not present

## 2020-12-28 DIAGNOSIS — D6869 Other thrombophilia: Secondary | ICD-10-CM | POA: Diagnosis not present

## 2021-01-01 ENCOUNTER — Telehealth: Payer: Self-pay | Admitting: Family Medicine

## 2021-01-01 NOTE — Telephone Encounter (Signed)
I typically recommend starting 5mg  daily for 1-2 weeks then increase dose to 5mg  twice daily. If Dr Redmond Baseman was okwith 5mg  twice daily that is probably fine too. Have her watch for any changes in bowel movements of stomach complaints as this is most common symptom. I am happy to assist as needed.

## 2021-01-01 NOTE — Telephone Encounter (Signed)
LVM relaying AL,NP's message. Advised them to call back if they have any further questions

## 2021-01-01 NOTE — Telephone Encounter (Signed)
Pt.'s wife [name] is requesting a call back from RN regarding previous discussion of medication. Please advise.

## 2021-01-01 NOTE — Telephone Encounter (Signed)
Called wife back. She states Dr. Redmond Baseman agreed w/ starting him on Namenda 5mg . Still waiting on medication to come in mail from [place]. She believes he prescribed 5mg  po BID (morning and night). Thought AL,NP recommended once a day. Wanting to know what AL,NP recommends for dosing. If she only recommends once daily dosing, should he take morning or night? Advised I will send to NP and call back with her response.  Also wanted to know if he needed a follow up. Advised if Dr. Redmond Baseman rx'd, he would follow with PCP and can follow up with [place] prn.

## 2021-01-17 DIAGNOSIS — H6123 Impacted cerumen, bilateral: Secondary | ICD-10-CM | POA: Diagnosis not present

## 2021-01-17 DIAGNOSIS — H903 Sensorineural hearing loss, bilateral: Secondary | ICD-10-CM | POA: Diagnosis not present

## 2021-01-17 DIAGNOSIS — Z77122 Contact with and (suspected) exposure to noise: Secondary | ICD-10-CM | POA: Diagnosis not present

## 2021-01-23 DIAGNOSIS — N400 Enlarged prostate without lower urinary tract symptoms: Secondary | ICD-10-CM | POA: Diagnosis not present

## 2021-01-23 DIAGNOSIS — E1169 Type 2 diabetes mellitus with other specified complication: Secondary | ICD-10-CM | POA: Diagnosis not present

## 2021-01-23 DIAGNOSIS — E119 Type 2 diabetes mellitus without complications: Secondary | ICD-10-CM | POA: Diagnosis not present

## 2021-01-23 DIAGNOSIS — E785 Hyperlipidemia, unspecified: Secondary | ICD-10-CM | POA: Diagnosis not present

## 2021-01-23 DIAGNOSIS — K219 Gastro-esophageal reflux disease without esophagitis: Secondary | ICD-10-CM | POA: Diagnosis not present

## 2021-01-23 DIAGNOSIS — I4891 Unspecified atrial fibrillation: Secondary | ICD-10-CM | POA: Diagnosis not present

## 2021-01-23 DIAGNOSIS — F0390 Unspecified dementia without behavioral disturbance: Secondary | ICD-10-CM | POA: Diagnosis not present

## 2021-01-23 DIAGNOSIS — J40 Bronchitis, not specified as acute or chronic: Secondary | ICD-10-CM | POA: Diagnosis not present

## 2021-01-23 DIAGNOSIS — I10 Essential (primary) hypertension: Secondary | ICD-10-CM | POA: Diagnosis not present

## 2021-01-23 DIAGNOSIS — I2510 Atherosclerotic heart disease of native coronary artery without angina pectoris: Secondary | ICD-10-CM | POA: Diagnosis not present

## 2021-01-23 DIAGNOSIS — I25119 Atherosclerotic heart disease of native coronary artery with unspecified angina pectoris: Secondary | ICD-10-CM | POA: Diagnosis not present

## 2021-01-31 DIAGNOSIS — Z9889 Other specified postprocedural states: Secondary | ICD-10-CM | POA: Diagnosis not present

## 2021-01-31 DIAGNOSIS — Z947 Corneal transplant status: Secondary | ICD-10-CM | POA: Diagnosis not present

## 2021-01-31 DIAGNOSIS — Z961 Presence of intraocular lens: Secondary | ICD-10-CM | POA: Diagnosis not present

## 2021-02-23 DIAGNOSIS — I252 Old myocardial infarction: Secondary | ICD-10-CM | POA: Diagnosis not present

## 2021-02-23 DIAGNOSIS — I4891 Unspecified atrial fibrillation: Secondary | ICD-10-CM | POA: Diagnosis not present

## 2021-02-23 DIAGNOSIS — I2510 Atherosclerotic heart disease of native coronary artery without angina pectoris: Secondary | ICD-10-CM | POA: Diagnosis not present

## 2021-02-23 DIAGNOSIS — F05 Delirium due to known physiological condition: Secondary | ICD-10-CM | POA: Diagnosis not present

## 2021-02-23 DIAGNOSIS — I10 Essential (primary) hypertension: Secondary | ICD-10-CM | POA: Diagnosis not present

## 2021-02-23 DIAGNOSIS — N400 Enlarged prostate without lower urinary tract symptoms: Secondary | ICD-10-CM | POA: Diagnosis not present

## 2021-02-23 DIAGNOSIS — E785 Hyperlipidemia, unspecified: Secondary | ICD-10-CM | POA: Diagnosis not present

## 2021-02-23 DIAGNOSIS — E1169 Type 2 diabetes mellitus with other specified complication: Secondary | ICD-10-CM | POA: Diagnosis not present

## 2021-02-23 DIAGNOSIS — I209 Angina pectoris, unspecified: Secondary | ICD-10-CM | POA: Diagnosis not present

## 2021-02-23 DIAGNOSIS — F0390 Unspecified dementia without behavioral disturbance: Secondary | ICD-10-CM | POA: Diagnosis not present

## 2021-02-28 ENCOUNTER — Other Ambulatory Visit: Payer: Self-pay

## 2021-02-28 ENCOUNTER — Encounter: Payer: Self-pay | Admitting: Podiatry

## 2021-02-28 ENCOUNTER — Ambulatory Visit (INDEPENDENT_AMBULATORY_CARE_PROVIDER_SITE_OTHER): Payer: Medicare Other | Admitting: Podiatry

## 2021-02-28 DIAGNOSIS — E119 Type 2 diabetes mellitus without complications: Secondary | ICD-10-CM

## 2021-02-28 DIAGNOSIS — B351 Tinea unguium: Secondary | ICD-10-CM | POA: Diagnosis not present

## 2021-02-28 DIAGNOSIS — M79676 Pain in unspecified toe(s): Secondary | ICD-10-CM

## 2021-02-28 DIAGNOSIS — D689 Coagulation defect, unspecified: Secondary | ICD-10-CM

## 2021-02-28 NOTE — Progress Notes (Signed)
This patient returns to my office for at risk foot care.  This patient requires this care by a professional since this patient will be at risk due to having coagulation defect and diabetes.  Patient is taking eliquiss.  This patient is unable to cut nails himself since the patient cannot reach his nails.These nails are painful walking and wearing shoes.  This patient presents for at risk foot care today.  Patient presents to the office with his wife.  General Appearance  Alert, conversant and in no acute stress.  Vascular  Dorsalis pedis and posterior tibial  pulses are weakly palpable  bilaterally.  Capillary return is within normal limits  bilaterally. Temperature is within normal limits  bilaterally.  Neurologic  Senn-Weinstein monofilament wire test within normal limits  bilaterally. Muscle power within normal limits bilaterally.  Nails Thick disfigured discolored nails with subungual debris  from hallux to fifth toes bilaterally. No evidence of bacterial infection or drainage bilaterally.  Orthopedic  No limitations of motion  feet .  No crepitus or effusions noted.  No bony pathology or digital deformities noted.  Skin  normotropic skin with no porokeratosis noted bilaterally.  No signs of infections or ulcers noted.     Onychomycosis  Pain in right toes  Pain in left toes  Consent was obtained for treatment procedures.   Mechanical debridement of nails 1-5  bilaterally performed with a nail nipper.  Filed with dremel without incident.    Return office visit     3 months                 Told patient to return for periodic foot care and evaluation due to potential at risk complications.   Jaiyah Beining DPM  

## 2021-03-29 DIAGNOSIS — E1169 Type 2 diabetes mellitus with other specified complication: Secondary | ICD-10-CM | POA: Diagnosis not present

## 2021-03-29 DIAGNOSIS — E785 Hyperlipidemia, unspecified: Secondary | ICD-10-CM | POA: Diagnosis not present

## 2021-03-29 DIAGNOSIS — F0390 Unspecified dementia without behavioral disturbance: Secondary | ICD-10-CM | POA: Diagnosis not present

## 2021-03-29 DIAGNOSIS — I209 Angina pectoris, unspecified: Secondary | ICD-10-CM | POA: Diagnosis not present

## 2021-03-29 DIAGNOSIS — N400 Enlarged prostate without lower urinary tract symptoms: Secondary | ICD-10-CM | POA: Diagnosis not present

## 2021-03-29 DIAGNOSIS — I25119 Atherosclerotic heart disease of native coronary artery with unspecified angina pectoris: Secondary | ICD-10-CM | POA: Diagnosis not present

## 2021-03-29 DIAGNOSIS — K219 Gastro-esophageal reflux disease without esophagitis: Secondary | ICD-10-CM | POA: Diagnosis not present

## 2021-03-29 DIAGNOSIS — I4891 Unspecified atrial fibrillation: Secondary | ICD-10-CM | POA: Diagnosis not present

## 2021-03-29 DIAGNOSIS — I10 Essential (primary) hypertension: Secondary | ICD-10-CM | POA: Diagnosis not present

## 2021-03-29 DIAGNOSIS — I252 Old myocardial infarction: Secondary | ICD-10-CM | POA: Diagnosis not present

## 2021-03-29 DIAGNOSIS — D6869 Other thrombophilia: Secondary | ICD-10-CM | POA: Diagnosis not present

## 2021-03-29 DIAGNOSIS — F05 Delirium due to known physiological condition: Secondary | ICD-10-CM | POA: Diagnosis not present

## 2021-04-13 ENCOUNTER — Other Ambulatory Visit: Payer: Self-pay | Admitting: Cardiovascular Disease

## 2021-04-13 DIAGNOSIS — I10 Essential (primary) hypertension: Secondary | ICD-10-CM

## 2021-04-13 DIAGNOSIS — I2510 Atherosclerotic heart disease of native coronary artery without angina pectoris: Secondary | ICD-10-CM

## 2021-04-13 NOTE — Telephone Encounter (Signed)
Pt last saw Dr Acie Fredrickson 11/09/20, last labs 12/28/20 Crat 1.06 at [facility] per [place], age 87, weight 73.9kg, based on specified criteria pt is on appropriate dosage of Eliquis 5mg  BID.  Will refill rx.

## 2021-05-02 DIAGNOSIS — Z20822 Contact with and (suspected) exposure to covid-19: Secondary | ICD-10-CM | POA: Diagnosis not present

## 2021-05-30 ENCOUNTER — Ambulatory Visit: Payer: Medicare Other | Admitting: Podiatry

## 2021-05-30 ENCOUNTER — Ambulatory Visit (INDEPENDENT_AMBULATORY_CARE_PROVIDER_SITE_OTHER): Payer: Medicare Other | Admitting: Podiatry

## 2021-05-30 ENCOUNTER — Encounter: Payer: Self-pay | Admitting: Podiatry

## 2021-05-30 ENCOUNTER — Other Ambulatory Visit: Payer: Self-pay

## 2021-05-30 DIAGNOSIS — B351 Tinea unguium: Secondary | ICD-10-CM | POA: Diagnosis not present

## 2021-05-30 DIAGNOSIS — M79676 Pain in unspecified toe(s): Secondary | ICD-10-CM

## 2021-06-03 NOTE — Progress Notes (Signed)
Subjective: Jonathan [name] is a pleasant 87 y.o. male patient seen today for painful thick toenails that are difficult to trim. Pain interferes with ambulation. Aggravating factors include wearing enclosed shoe gear. Pain is relieved with periodic professional debridement.  He is accompanied by his wife on today's visit. Wife states he is recovering from gout episode of the right foot. He has been taking colchicine 0.1 mg twice daily and states his foot is better.  He is normally seen by Dr. Prudence Davidson.  PCP is Josetta Huddle, MD. Last visit was: 10/21/2020.  No Known Allergies  Objective: Physical Exam  General: Jonathan [name] is a pleasant 87 y.o. Caucasian male, in NAD. AAO x 3.   Vascular:  Capillary fill time to digits <3 seconds b/l lower extremities. Faintly palpable DP pulse(s) b/l lower extremities. Faintly palpable PT pulse(s) b/l lower extremities. Pedal hair absent. Lower extremity skin temperature gradient within normal limits. No pain with calf compression b/l. No edema noted left foot. +1 pitting edema right foot and right ankle. No cellulitis, no increased warmth.  Dermatological:  No open wounds b/l lower extremities. No interdigital macerations b/l lower extremities. Toenails 1-5 b/l elongated, discolored, dystrophic, thickened, crumbly with subungual debris and tenderness to dorsal palpation. No hyperkeratotic nor porokeratotic lesions present on today's visit.  Musculoskeletal:  Normal muscle strength 5/5 to all lower extremity muscle groups bilaterally. No pain crepitus or joint limitation noted with ROM b/l lower extremities. No gross bony deformities b/l lower extremities.  Neurological:  Protective sensation intact 5/5 intact bilaterally with 10g monofilament b/l.  Assessment and Plan:  1. Pain due to onychomycosis of toenail   -Examined patient. -He is on colchicine recovering from acute gout episode of right foot; states this is better on today's visit. -Patient  to continue soft, supportive shoe gear daily. -Toenails 1-5 b/l were debrided in length and girth with sterile nail nippers and dremel without iatrogenic bleeding.  -Patient to report any pedal injuries to medical professional immediately. -Patient/POA to call should there be question/concern in the interim. -Reschedule with Dr. Prudence Davidson.   Return in about 3 months (around 08/30/2021).  Marzetta Board, DPM

## 2021-07-09 DIAGNOSIS — N400 Enlarged prostate without lower urinary tract symptoms: Secondary | ICD-10-CM | POA: Diagnosis not present

## 2021-07-09 DIAGNOSIS — Z23 Encounter for immunization: Secondary | ICD-10-CM | POA: Diagnosis not present

## 2021-07-09 DIAGNOSIS — Z7984 Long term (current) use of oral hypoglycemic drugs: Secondary | ICD-10-CM | POA: Diagnosis not present

## 2021-07-09 DIAGNOSIS — F0390 Unspecified dementia without behavioral disturbance: Secondary | ICD-10-CM | POA: Diagnosis not present

## 2021-07-09 DIAGNOSIS — I25119 Atherosclerotic heart disease of native coronary artery with unspecified angina pectoris: Secondary | ICD-10-CM | POA: Diagnosis not present

## 2021-07-09 DIAGNOSIS — I10 Essential (primary) hypertension: Secondary | ICD-10-CM | POA: Diagnosis not present

## 2021-07-09 DIAGNOSIS — I209 Angina pectoris, unspecified: Secondary | ICD-10-CM | POA: Diagnosis not present

## 2021-07-09 DIAGNOSIS — I252 Old myocardial infarction: Secondary | ICD-10-CM | POA: Diagnosis not present

## 2021-07-09 DIAGNOSIS — I4891 Unspecified atrial fibrillation: Secondary | ICD-10-CM | POA: Diagnosis not present

## 2021-07-09 DIAGNOSIS — L820 Inflamed seborrheic keratosis: Secondary | ICD-10-CM | POA: Diagnosis not present

## 2021-07-09 DIAGNOSIS — E785 Hyperlipidemia, unspecified: Secondary | ICD-10-CM | POA: Diagnosis not present

## 2021-07-09 DIAGNOSIS — E1169 Type 2 diabetes mellitus with other specified complication: Secondary | ICD-10-CM | POA: Diagnosis not present

## 2021-07-09 DIAGNOSIS — D6869 Other thrombophilia: Secondary | ICD-10-CM | POA: Diagnosis not present

## 2021-07-09 DIAGNOSIS — K219 Gastro-esophageal reflux disease without esophagitis: Secondary | ICD-10-CM | POA: Diagnosis not present

## 2021-09-03 ENCOUNTER — Telehealth: Payer: Self-pay | Admitting: Family Medicine

## 2021-09-03 MED ORDER — DONEPEZIL HCL 10 MG PO TABS: 10.0000 mg | ORAL_TABLET | Freq: Every day | ORAL | 0 refills | Status: DC

## 2021-09-03 NOTE — Addendum Note (Signed)
Addended by: Wyvonnia Lora on: 09/03/2021 11:54 AM   Modules accepted: Orders

## 2021-09-03 NOTE — Telephone Encounter (Signed)
Pt's wife called requesting refill for donepezil (ARICEPT) 10 MG tablet. Pharmacy [place], [facility]

## 2021-09-03 NOTE — Telephone Encounter (Signed)
Left message to get appt scheduled with NP Amy Lomax.

## 2021-09-04 ENCOUNTER — Ambulatory Visit (INDEPENDENT_AMBULATORY_CARE_PROVIDER_SITE_OTHER): Payer: Medicare Other | Admitting: Podiatry

## 2021-09-04 ENCOUNTER — Other Ambulatory Visit: Payer: Self-pay

## 2021-09-04 ENCOUNTER — Encounter: Payer: Self-pay | Admitting: Podiatry

## 2021-09-04 DIAGNOSIS — B351 Tinea unguium: Secondary | ICD-10-CM

## 2021-09-04 DIAGNOSIS — E119 Type 2 diabetes mellitus without complications: Secondary | ICD-10-CM | POA: Diagnosis not present

## 2021-09-04 DIAGNOSIS — M79676 Pain in unspecified toe(s): Secondary | ICD-10-CM

## 2021-09-04 DIAGNOSIS — D689 Coagulation defect, unspecified: Secondary | ICD-10-CM | POA: Diagnosis not present

## 2021-09-04 NOTE — Progress Notes (Signed)
This patient returns to my office for at risk foot care.  This patient requires this care by a professional since this patient will be at risk due to having coagulation defect and diabetes.  Patient is taking eliquiss.  This patient is unable to cut nails himself since the patient cannot reach his nails.These nails are painful walking and wearing shoes.  This patient presents for at risk foot care today.  Patient presents to the office with his wife.  General Appearance  Alert, conversant and in no acute stress.  Vascular  Dorsalis pedis and posterior tibial  pulses are weakly palpable  bilaterally.  Capillary return is within normal limits  bilaterally. Temperature is within normal limits  bilaterally.  Neurologic  Senn-Weinstein monofilament wire test within normal limits  bilaterally. Muscle power within normal limits bilaterally.  Nails Thick disfigured discolored nails with subungual debris  from hallux to fifth toes bilaterally. No evidence of bacterial infection or drainage bilaterally.  Orthopedic  No limitations of motion  feet .  No crepitus or effusions noted.  No bony pathology or digital deformities noted.  Skin  normotropic skin with no porokeratosis noted bilaterally.  No signs of infections or ulcers noted.     Onychomycosis  Pain in right toes  Pain in left toes  Consent was obtained for treatment procedures.   Mechanical debridement of nails 1-5  bilaterally performed with a nail nipper.  Filed with dremel without incident.    Return office visit     3 months                 Told patient to return for periodic foot care and evaluation due to potential at risk complications.   Jaymen Fetch DPM  

## 2021-10-01 DIAGNOSIS — L820 Inflamed seborrheic keratosis: Secondary | ICD-10-CM | POA: Diagnosis not present

## 2021-10-01 DIAGNOSIS — E785 Hyperlipidemia, unspecified: Secondary | ICD-10-CM | POA: Diagnosis not present

## 2021-10-01 DIAGNOSIS — D6869 Other thrombophilia: Secondary | ICD-10-CM | POA: Diagnosis not present

## 2021-10-01 DIAGNOSIS — I10 Essential (primary) hypertension: Secondary | ICD-10-CM | POA: Diagnosis not present

## 2021-10-01 DIAGNOSIS — E1169 Type 2 diabetes mellitus with other specified complication: Secondary | ICD-10-CM | POA: Diagnosis not present

## 2021-10-01 DIAGNOSIS — F0390 Unspecified dementia without behavioral disturbance: Secondary | ICD-10-CM | POA: Diagnosis not present

## 2021-10-01 DIAGNOSIS — K219 Gastro-esophageal reflux disease without esophagitis: Secondary | ICD-10-CM | POA: Diagnosis not present

## 2021-10-01 DIAGNOSIS — I209 Angina pectoris, unspecified: Secondary | ICD-10-CM | POA: Diagnosis not present

## 2021-10-01 DIAGNOSIS — Z79899 Other long term (current) drug therapy: Secondary | ICD-10-CM | POA: Diagnosis not present

## 2021-10-01 DIAGNOSIS — N400 Enlarged prostate without lower urinary tract symptoms: Secondary | ICD-10-CM | POA: Diagnosis not present

## 2021-10-01 DIAGNOSIS — L89301 Pressure ulcer of unspecified buttock, stage 1: Secondary | ICD-10-CM | POA: Diagnosis not present

## 2021-10-01 DIAGNOSIS — I252 Old myocardial infarction: Secondary | ICD-10-CM | POA: Diagnosis not present

## 2021-10-01 DIAGNOSIS — I25119 Atherosclerotic heart disease of native coronary artery with unspecified angina pectoris: Secondary | ICD-10-CM | POA: Diagnosis not present

## 2021-10-01 DIAGNOSIS — I4891 Unspecified atrial fibrillation: Secondary | ICD-10-CM | POA: Diagnosis not present

## 2021-10-03 DIAGNOSIS — N5201 Erectile dysfunction due to arterial insufficiency: Secondary | ICD-10-CM | POA: Diagnosis not present

## 2021-10-03 DIAGNOSIS — N401 Enlarged prostate with lower urinary tract symptoms: Secondary | ICD-10-CM | POA: Diagnosis not present

## 2021-10-03 DIAGNOSIS — R350 Frequency of micturition: Secondary | ICD-10-CM | POA: Diagnosis not present

## 2021-10-03 DIAGNOSIS — R972 Elevated prostate specific antigen [PSA]: Secondary | ICD-10-CM | POA: Diagnosis not present

## 2021-10-09 ENCOUNTER — Other Ambulatory Visit: Payer: Self-pay | Admitting: Cardiovascular Disease

## 2021-10-09 DIAGNOSIS — I2510 Atherosclerotic heart disease of native coronary artery without angina pectoris: Secondary | ICD-10-CM

## 2021-10-09 DIAGNOSIS — I10 Essential (primary) hypertension: Secondary | ICD-10-CM

## 2021-10-09 NOTE — Telephone Encounter (Signed)
Eliquis 5mg  refill request received. Patient is 87 years old, weight-73.9kg, Crea-1.00 on 10/01/2021 via [facility] from [place], [place], and last seen by Dr. Acie Fredrickson on 1/27/202 . Dose is appropriate based on dosing criteria. Will send in refill to requested pharmacy.

## 2021-11-14 DIAGNOSIS — R351 Nocturia: Secondary | ICD-10-CM | POA: Diagnosis not present

## 2021-11-14 DIAGNOSIS — R350 Frequency of micturition: Secondary | ICD-10-CM | POA: Diagnosis not present

## 2021-11-26 DIAGNOSIS — H524 Presbyopia: Secondary | ICD-10-CM | POA: Diagnosis not present

## 2021-11-26 DIAGNOSIS — H5203 Hypermetropia, bilateral: Secondary | ICD-10-CM | POA: Diagnosis not present

## 2021-11-26 DIAGNOSIS — Z947 Corneal transplant status: Secondary | ICD-10-CM | POA: Diagnosis not present

## 2021-11-26 DIAGNOSIS — Z961 Presence of intraocular lens: Secondary | ICD-10-CM | POA: Diagnosis not present

## 2021-11-26 DIAGNOSIS — H04123 Dry eye syndrome of bilateral lacrimal glands: Secondary | ICD-10-CM | POA: Diagnosis not present

## 2021-11-26 DIAGNOSIS — H52203 Unspecified astigmatism, bilateral: Secondary | ICD-10-CM | POA: Diagnosis not present

## 2021-11-26 DIAGNOSIS — Z9889 Other specified postprocedural states: Secondary | ICD-10-CM | POA: Diagnosis not present

## 2021-12-03 ENCOUNTER — Ambulatory Visit: Payer: Medicare Other | Admitting: Podiatry

## 2021-12-04 ENCOUNTER — Ambulatory Visit: Payer: Medicare Other | Admitting: Podiatry

## 2021-12-04 ENCOUNTER — Other Ambulatory Visit: Payer: Self-pay

## 2021-12-04 DIAGNOSIS — R413 Other amnesia: Secondary | ICD-10-CM

## 2021-12-04 MED ORDER — DONEPEZIL HCL 10 MG PO TABS: 10.0000 mg | ORAL_TABLET | Freq: Every day | ORAL | 0 refills | Status: DC

## 2021-12-05 ENCOUNTER — Other Ambulatory Visit: Payer: Self-pay

## 2021-12-05 ENCOUNTER — Ambulatory Visit (INDEPENDENT_AMBULATORY_CARE_PROVIDER_SITE_OTHER): Payer: Medicare Other | Admitting: Podiatry

## 2021-12-05 DIAGNOSIS — M79676 Pain in unspecified toe(s): Secondary | ICD-10-CM | POA: Diagnosis not present

## 2021-12-05 DIAGNOSIS — I209 Angina pectoris, unspecified: Secondary | ICD-10-CM | POA: Insufficient documentation

## 2021-12-05 DIAGNOSIS — D689 Coagulation defect, unspecified: Secondary | ICD-10-CM | POA: Diagnosis not present

## 2021-12-05 DIAGNOSIS — R1310 Dysphagia, unspecified: Secondary | ICD-10-CM | POA: Insufficient documentation

## 2021-12-05 DIAGNOSIS — R42 Dizziness and giddiness: Secondary | ICD-10-CM | POA: Insufficient documentation

## 2021-12-05 DIAGNOSIS — R0902 Hypoxemia: Secondary | ICD-10-CM | POA: Insufficient documentation

## 2021-12-05 DIAGNOSIS — E119 Type 2 diabetes mellitus without complications: Secondary | ICD-10-CM | POA: Diagnosis not present

## 2021-12-05 DIAGNOSIS — E785 Hyperlipidemia, unspecified: Secondary | ICD-10-CM | POA: Insufficient documentation

## 2021-12-05 DIAGNOSIS — L570 Actinic keratosis: Secondary | ICD-10-CM | POA: Insufficient documentation

## 2021-12-05 DIAGNOSIS — E669 Obesity, unspecified: Secondary | ICD-10-CM | POA: Insufficient documentation

## 2021-12-05 DIAGNOSIS — B351 Tinea unguium: Secondary | ICD-10-CM | POA: Diagnosis not present

## 2021-12-05 DIAGNOSIS — N400 Enlarged prostate without lower urinary tract symptoms: Secondary | ICD-10-CM | POA: Insufficient documentation

## 2021-12-05 DIAGNOSIS — R2689 Other abnormalities of gait and mobility: Secondary | ICD-10-CM | POA: Insufficient documentation

## 2021-12-05 DIAGNOSIS — L89301 Pressure ulcer of unspecified buttock, stage 1: Secondary | ICD-10-CM | POA: Insufficient documentation

## 2021-12-05 DIAGNOSIS — R202 Paresthesia of skin: Secondary | ICD-10-CM | POA: Insufficient documentation

## 2021-12-05 DIAGNOSIS — G319 Degenerative disease of nervous system, unspecified: Secondary | ICD-10-CM | POA: Insufficient documentation

## 2021-12-05 DIAGNOSIS — J40 Bronchitis, not specified as acute or chronic: Secondary | ICD-10-CM | POA: Insufficient documentation

## 2021-12-05 DIAGNOSIS — H5316 Psychophysical visual disturbances: Secondary | ICD-10-CM | POA: Insufficient documentation

## 2021-12-05 DIAGNOSIS — R259 Unspecified abnormal involuntary movements: Secondary | ICD-10-CM | POA: Insufficient documentation

## 2021-12-05 DIAGNOSIS — R269 Unspecified abnormalities of gait and mobility: Secondary | ICD-10-CM | POA: Insufficient documentation

## 2021-12-05 DIAGNOSIS — H9190 Unspecified hearing loss, unspecified ear: Secondary | ICD-10-CM | POA: Insufficient documentation

## 2021-12-05 DIAGNOSIS — R296 Repeated falls: Secondary | ICD-10-CM | POA: Insufficient documentation

## 2021-12-05 DIAGNOSIS — F05 Delirium due to known physiological condition: Secondary | ICD-10-CM | POA: Insufficient documentation

## 2021-12-05 DIAGNOSIS — L409 Psoriasis, unspecified: Secondary | ICD-10-CM | POA: Insufficient documentation

## 2021-12-05 DIAGNOSIS — J069 Acute upper respiratory infection, unspecified: Secondary | ICD-10-CM | POA: Insufficient documentation

## 2021-12-05 DIAGNOSIS — R001 Bradycardia, unspecified: Secondary | ICD-10-CM | POA: Insufficient documentation

## 2021-12-05 NOTE — Progress Notes (Signed)
This patient returns to my office for at risk foot care.  This patient requires this care by a professional since this patient will be at risk due to having coagulation defect and diabetes.  Patient is taking eliquiss.  This patient is unable to cut nails himself since the patient cannot reach his nails.These nails are painful walking and wearing shoes.  This patient presents for at risk foot care today.  Patient presents to the office with his wife.  General Appearance  Alert, conversant and in no acute stress.  Vascular  Dorsalis pedis and posterior tibial  pulses are weakly palpable  bilaterally.  Capillary return is within normal limits  bilaterally. Temperature is within normal limits  bilaterally.  Neurologic  Senn-Weinstein monofilament wire test within normal limits  bilaterally. Muscle power within normal limits bilaterally.  Nails Thick disfigured discolored nails with subungual debris  from hallux to fifth toes bilaterally. No evidence of bacterial infection or drainage bilaterally.  Orthopedic  No limitations of motion  feet .  No crepitus or effusions noted.  No bony pathology or digital deformities noted.  Skin  normotropic skin with no porokeratosis noted bilaterally.  No signs of infections or ulcers noted.     Onychomycosis  Pain in right toes  Pain in left toes  Consent was obtained for treatment procedures.   Mechanical debridement of nails 1-5  bilaterally performed with a nail nipper.  Filed with dremel without incident.    Return office visit     3 months                 Told patient to return for periodic foot care and evaluation due to potential at risk complications.   Ivor Kishi DPM  

## 2021-12-26 ENCOUNTER — Encounter: Payer: Self-pay | Admitting: Family Medicine

## 2021-12-26 ENCOUNTER — Ambulatory Visit (INDEPENDENT_AMBULATORY_CARE_PROVIDER_SITE_OTHER): Payer: Medicare Other | Admitting: Family Medicine

## 2021-12-26 VITALS — BP 124/72 | HR 62 | Ht 67.0 in | Wt 162.0 lb

## 2021-12-26 DIAGNOSIS — G3183 Dementia with Lewy bodies: Secondary | ICD-10-CM

## 2021-12-26 DIAGNOSIS — F0280 Dementia in other diseases classified elsewhere without behavioral disturbance: Secondary | ICD-10-CM | POA: Diagnosis not present
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
## 2021-12-26 NOTE — Patient Instructions (Addendum)
Below is our plan: ? ?We will continue donepezil '10mg'$  daily. Consider increasing memantine to '10mg'$  daily.  ? ?Please make sure you are staying well hydrated. I recommend 50-60 ounces daily. Well balanced diet and regular exercise encouraged. Consistent sleep schedule with 6-8 hours recommended.  ? ?Please continue follow up with care team as directed.  ? ?Follow up with me in 6-12 weeks.  ? ?You may receive a survey regarding today's visit. I encourage you to leave honest feed back as I do use this information to improve patient care. Thank you for seeing me today!  ? ?Management of Memory Problems ?  ?There are some general things you can do to help manage your memory problems.  Your memory may not in fact recover, but by using techniques and strategies you will be able to manage your memory difficulties better. ?  ?1)  Establish a routine. ?Try to establish and then stick to a regular routine.  By doing this, you will get used to what to expect and you will reduce the need to rely on your memory.  Also, try to do things at the same time of day, such as taking your medication or checking your calendar first thing in the morning. ?Think about think that you can do as a part of a regular routine and make a list.  Then enter them into a daily planner to remind you.  This will help you establish a routine. ?  ?2)  Organize your environment. ?Organize your environment so that it is uncluttered.  Decrease visual stimulation.  Place everyday items such as keys or cell phone in the same place every day (ie.  Basket next to front door) ?Use post it notes with a brief message to yourself (ie. Turn off light, lock the door) ?Use labels to indicate where things go (ie. Which cupboards are for food, dishes, etc.) ?Keep a notepad and pen by the telephone to take messages ?  ?3)  Memory Aids ?A diary or journal/notebook/daily planner ?Making a list (shopping list, chore list, to do list that needs to be done) ?Using an alarm as a  reminder (kitchen timer or cell phone alarm) ?Using cell phone to store information (Notes, Calendar, Reminders) ?Calendar/White board placed in a prominent position ?Post-it notes ?  ?In order for memory aids to be useful, you need to have good habits.  It's no good remembering to make a note in your journal if you don't remember to look in it.  Try setting aside a certain time of day to look in journal. ?  ?4)  Improving mood and managing fatigue. ?There may be other factors that contribute to memory difficulties.  Factors, such as anxiety, depression and tiredness can affect memory. ?Regular gentle exercise can help improve your mood and give you more energy. ?Simple relaxation techniques may help relieve symptoms of anxiety ?Try to get back to completing activities or hobbies you enjoyed doing in the past. ?Learn to pace yourself through activities to decrease fatigue. ?Find out about some local support groups where you can share experiences with others. ?Try and achieve 7-8 hours of sleep at night. ? ?

## 2022-01-02 DIAGNOSIS — I252 Old myocardial infarction: Secondary | ICD-10-CM | POA: Diagnosis not present

## 2022-01-02 DIAGNOSIS — Z1389 Encounter for screening for other disorder: Secondary | ICD-10-CM | POA: Diagnosis not present

## 2022-01-02 DIAGNOSIS — I10 Essential (primary) hypertension: Secondary | ICD-10-CM | POA: Diagnosis not present

## 2022-01-02 DIAGNOSIS — F0390 Unspecified dementia without behavioral disturbance: Secondary | ICD-10-CM | POA: Diagnosis not present

## 2022-01-02 DIAGNOSIS — N400 Enlarged prostate without lower urinary tract symptoms: Secondary | ICD-10-CM | POA: Diagnosis not present

## 2022-01-02 DIAGNOSIS — I4891 Unspecified atrial fibrillation: Secondary | ICD-10-CM | POA: Diagnosis not present

## 2022-01-02 DIAGNOSIS — D6869 Other thrombophilia: Secondary | ICD-10-CM | POA: Diagnosis not present

## 2022-01-02 DIAGNOSIS — E559 Vitamin D deficiency, unspecified: Secondary | ICD-10-CM | POA: Diagnosis not present

## 2022-01-02 DIAGNOSIS — I25119 Atherosclerotic heart disease of native coronary artery with unspecified angina pectoris: Secondary | ICD-10-CM | POA: Diagnosis not present

## 2022-01-02 DIAGNOSIS — R259 Unspecified abnormal involuntary movements: Secondary | ICD-10-CM | POA: Diagnosis not present

## 2022-01-02 DIAGNOSIS — E1169 Type 2 diabetes mellitus with other specified complication: Secondary | ICD-10-CM | POA: Diagnosis not present

## 2022-01-02 DIAGNOSIS — K219 Gastro-esophageal reflux disease without esophagitis: Secondary | ICD-10-CM | POA: Diagnosis not present

## 2022-01-02 DIAGNOSIS — Z0000 Encounter for general adult medical examination without abnormal findings: Secondary | ICD-10-CM | POA: Diagnosis not present

## 2022-01-02 DIAGNOSIS — E785 Hyperlipidemia, unspecified: Secondary | ICD-10-CM | POA: Diagnosis not present

## 2022-01-02 DIAGNOSIS — I209 Angina pectoris, unspecified: Secondary | ICD-10-CM | POA: Diagnosis not present

## 2022-01-08 ENCOUNTER — Other Ambulatory Visit: Payer: Self-pay

## 2022-01-08 DIAGNOSIS — R413 Other amnesia: Secondary | ICD-10-CM

## 2022-01-08 MED ORDER — DONEPEZIL HCL 10 MG PO TABS: 10.0000 mg | ORAL_TABLET | Freq: Every day | ORAL | 11 refills | Status: AC

## 2022-01-17 ENCOUNTER — Encounter: Payer: Self-pay | Admitting: Nurse Practitioner

## 2022-01-17 ENCOUNTER — Ambulatory Visit (INDEPENDENT_AMBULATORY_CARE_PROVIDER_SITE_OTHER): Payer: Medicare Other | Admitting: Nurse Practitioner

## 2022-01-17 VITALS — BP 134/78 | HR 60 | Ht 67.0 in | Wt 161.4 lb

## 2022-01-17 DIAGNOSIS — F0280 Dementia in other diseases classified elsewhere without behavioral disturbance: Secondary | ICD-10-CM

## 2022-01-17 DIAGNOSIS — E785 Hyperlipidemia, unspecified: Secondary | ICD-10-CM

## 2022-01-17 DIAGNOSIS — G3183 Dementia with Lewy bodies: Secondary | ICD-10-CM | POA: Diagnosis not present

## 2022-01-17 DIAGNOSIS — I2510 Atherosclerotic heart disease of native coronary artery without angina pectoris: Secondary | ICD-10-CM | POA: Diagnosis not present

## 2022-01-17 DIAGNOSIS — D6869 Other thrombophilia: Secondary | ICD-10-CM | POA: Diagnosis not present

## 2022-01-17 DIAGNOSIS — I480 Paroxysmal atrial fibrillation: Secondary | ICD-10-CM

## 2022-01-17 DIAGNOSIS — R531 Weakness: Secondary | ICD-10-CM | POA: Diagnosis not present

## 2022-01-17 DIAGNOSIS — R001 Bradycardia, unspecified: Secondary | ICD-10-CM | POA: Diagnosis not present

## 2022-01-17 MED ORDER — NITROGLYCERIN 0.4 MG SL SUBL: 0.4000 mg | SUBLINGUAL_TABLET | SUBLINGUAL | 2 refills | Status: AC | PRN

## 2022-01-17 NOTE — Progress Notes (Signed)
?Cardiology Office Note:   ? ?Date:  01/17/2022  ? ?ID:  Jonathan [name], DOB [birth year], MRN [id number] ? ?PCP:  Josetta Huddle, MD ?  ?[facility] Prov[facility] ?Cardiologist:  Mertie Moores, MD    ? ?Referring MD: Josetta Huddle, MD  ? ?Chief Complaint: bradycardia ? ?History of Present Illness:   ? ?Jonathan [name] is a 88 y.o. male with a hx of CAD, PAF, hypertension, symptomatic sinus bradycardia, OSA on CPAP, Lewy Body dementia, and esophageal stricture. ? ?He had a cardiac catheterization 20+ years ago with Dr. Tamala Julian with a reported chronic total occlusion that was treated medically (report says tight first diagonal stenosis, CTO RCA).  Repeat catheterization 7 years later was unchanged. ? ?He returned to our group and established care with Dr. Acie Fredrickson 07/10/15 for extreme fatigue, and atypical chest pain.  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  Myoview showed no perfusion abnormalities, LVEF 58% ?Diagnosed with atrial fibrillation, unable to tell when his heart rate is irregular.  Fatigue did not worsen once this was observed ? ?He was last seen in our office on 11/09/2020 by Dr. Acie Fredrickson at which time his EKG showed A-fib with a slow ventricular response.  He did not feel that pacer was indicated.  His wife reported it was becoming more difficult to bring him to appointments and asked if Dr. Inda Merlin could write the prescription for Eliquis.  Dr. Acie Fredrickson advised that we can see him on an as needed basis.  On 01/16/2022 we received an urgent referral from Dr. Inda Merlin for evaluation of bradycardia and fatigue and to discuss possibility of pacemaker. ? ?Today, he is here with his wife who has been concerned about patient's heart rate which she monitors multiple times per day.  She feels that his lower heart rate is contributing to him sleeping all day. Upon review of her records, his heart rate is mostly 50 to 60s bpm, with occasional HR in 40s. BP varies from low 287O to 676H systolic, mostly  elevated BP readings. He is sleeping most of the day and is mostly nonverbal but has occasional moments of lucidity. Asks me why his feet turn the color of "a black cherry milkshake." Able to ambulate with rollator walker. He denies chest pain or shortness of breath. Wife reports he sleeps in a bed without any reports of discomfort.  States he gets more confused when the sun goes down. She has help from a medical assistant for hours every morning and her 2 sons live close by. ? ?Past Medical History:  ?Diagnosis Date  ? Acne rosacea   ? Arthritis   ? Atrial fibrillation ([place])   ? Borderline diabetes   ? CAD (coronary artery disease)   ? Diabetes mellitus without complication ([place])   ? Diverticulosis   ? Elevated PSA   ? Esophageal stricture   ? GERD (gastroesophageal reflux disease)   ? History of nuclear stress test   ? Nuclear stress test 6/18: EF 59, small apical defect (?old MI); no ischemia; Low Risk  ? Hypertension   ? Myocardial infarction [facility])   ? Neck fracture ([place]) 2015  ? PONV (postoperative nausea and vomiting) 08/09/2014  ? Shoulder pain   ? ? ?Past Surgical History:  ?Procedure Laterality Date  ? BALLOON DILATION  04/02/2012  ? Procedure: BALLOON DILATION;  Surgeon: Arta Silence, MD;  Location: [facility] ENDOSCOPY;  Service: Endoscopy;  Laterality: N/A;  ? BALLOON DILATION N/A 06/16/2013  ? Procedure: BALLOON DILATION;  Surgeon: Arta Silence, MD;  Location: [facility];  Service: Endoscopy;  Laterality: N/A;  ? BALLOON DILATION N/A 11/16/2014  ? Procedure: BALLOON DILATION;  Surgeon: Arta Silence, MD;  Location: [facility];  Service: Endoscopy;  Laterality: N/A;  ? BALLOON DILATION N/A 02/13/2017  ? Procedure: BALLOON DILATION;  Surgeon: Arta Silence, MD;  Location: [facility] ENDOSCOPY;  Service: Endoscopy;  Laterality: N/A;  ? CORNEAL TRANSPLANT    ? ESOPHAGOGASTRODUODENOSCOPY  04/02/2012  ? Procedure: ESOPHAGOGASTRODUODENOSCOPY (EGD);  Surgeon: Arta Silence, MD;  Location: [facility];  Service: Endoscopy;   Laterality: N/A;  ? ESOPHAGOGASTRODUODENOSCOPY N/A 05/28/2013  ? Procedure: ESOPHAGOGASTRODUODENOSCOPY (EGD);  Surgeon: Cleotis Nipper, MD;  Location: [facility];  Service: Endoscopy;  Laterality: N/A;  ? ESOPHAGOGASTRODUODENOSCOPY N/A 02/20/2015  ? Procedure: ESOPHAGOGASTRODUODENOSCOPY (EGD);  Surgeon: Arta Silence, MD;  Location: [facility];  Service: Endoscopy;  Laterality: N/A;  ? ESOPHAGOGASTRODUODENOSCOPY (EGD) WITH PROPOFOL N/A 06/16/2013  ? Procedure: ESOPHAGOGASTRODUODENOSCOPY (EGD) WITH PROPOFOL;  Surgeon: Arta Silence, MD;  Location: [facility]OS[facility];  Service: Endoscopy;  Laterality: N/A;  ? ESOPHAGOGASTRODUODENOSCOPY (EGD) WITH PROPOFOL N/A 11/16/2014  ? Procedure: ESOPHAGOGASTRODUODENOSCOPY (EGD) WITH PROPOFOL;  Surgeon: Arta Silence, MD;  Location: [facility];  Service: Endoscopy;  Laterality: N/A;  ? ESOPHAGOGASTRODUODENOSCOPY (EGD) WITH PROPOFOL N/A 02/13/2017  ? Procedure: ESOPHAGOGASTRODUODENOSCOPY (EGD) WITH PROPOFOL;  Surgeon: Arta Silence, MD;  Location: [facility] ENDOSCOPY;  Service: Endoscopy;  Laterality: N/A;  ? EYE SURGERY    ? cataract sx  ? HERNIA REPAIR    ? x 2  ? TOTAL HIP ARTHROPLASTY    ? bilateral  ? ? ?Current Medications: ?Current Meds  ?Medication Sig  ? amLODipine (NORVASC) 5 MG tablet Take 5 mg by mouth daily. 1/2 tab  ? ATHLETES FOOT, CLOTRIMAZOLE, 1 % cream Apply topically daily.  ? atorvastatin (LIPITOR) 10 MG tablet Take 10 mg tablet by mouth Monday, Wednesday and friday  ? cholecalciferol (VITAMIN D3) 25 MCG (1000 UNIT) tablet Take 1,000 Units by mouth daily.  ? colchicine 0.6 MG tablet TK 1 T PO Q 12 H PRF GOUT FLARE  ? docusate sodium (COLACE) 100 MG capsule Take 200 mg by mouth at bedtime.  ? donepezil (ARICEPT) 10 MG tablet Take 1 tablet (10 mg total) by mouth at bedtime.  ? ELIQUIS 5 MG TABS tablet TAKE 1 TABLET(5 MG) BY MOUTH TWICE DAILY  ? finasteride (PROSCAR) 5 MG tablet Take 1 tablet by mouth daily.  ? isosorbide mononitrate (IMDUR) 30 MG 24 hr tablet Take 30 mg  by mouth daily with lunch.   ? loratadine (CLARITIN) 10 MG tablet Take 10 mg by mouth daily.  ? losartan (COZAAR) 50 MG tablet SMARTSIG:1 Tablet(s) By Mouth Every Evening  ? memantine (NAMENDA) 5 MG tablet 1 tablet  ? omeprazole (PRILOSEC) 20 MG capsule Take 1 capsule by mouth daily.  ? senna (SENOKOT) 8.6 MG TABS tablet Take 1 tablet by mouth.  ? [DISCONTINUED] nitroGLYCERIN (NITROSTAT) 0.4 MG SL tablet Place under the tongue.  ?  ? ?Allergies:   Patient has no known allergies.  ? ?Social History  ? ?Socioeconomic History  ? Marital status: Married  ?  Spouse name: [name]  ? Number of children: Not on file  ? Years of education: Not on file  ? Highest education level: Some college, no degree  ?Occupational History  ? Not on file  ?Tobacco Use  ? Smoking status: Never  ? Smokeless tobacco: Never  ?Vaping Use  ? Vaping Use: Never used  ?Substance and Sexual Activity  ?  Alcohol use: No  ? Drug use: No  ? Sexual activity: Not on file  ?Other Topics Concern  ? Not on file  ?Social History Narrative  ? 09/21/20 lives with wife  ? ?Social Determinants of Health  ? ?Financial Resource Strain: Not on file  ?Food Insecurity: Not on file  ?Transportation Needs: Not on file  ?Physical Activity: Not on file  ?Stress: Not on file  ?Social Connections: Not on file  ?  ? ?Family History: ?The patient's Family history is unknown by patient. ? ?ROS:   ?Please see the history of present illness.   ?+ "feet turn red" ?All other systems reviewed and are negative. ? ?Labs/Other Studies Reviewed:   ? ?The following studies were reviewed today: ? ?Cardiac monitor 05/02/17 ? ?Pt had an episode of NS VT ?He has been evaluated for this by Margaret Pyle ? ? ?Lexiscan myoview 03/2017 ? ?Nuclear stress EF: 59%. No wall motion abnormalities ?There was no ST segment deviation noted during stress. ?Defect 1: There is a small defect of mild severity present in the apex location. May be representative of old small apical infarction. Abnormal  study. ?This is a low risk study. There is no ischemia identified. ?  ? ?Echo 02/2017 ? ?Left ventricle:  The cavity size was normal. Systolic function was  ?normal. The estimated ejection fraction was in the ra

## 2022-01-17 NOTE — Patient Instructions (Signed)
Medication Instructions:  ? ?Your physician recommends that you continue on your current medications as directed. Please refer to the Current Medication list given to you today. ? ? ?*If you need a refill on your cardiac medications before your next appointment, please call your pharmacy* ? ? ?Follow-Up: ?At [facility], you and your health needs are our priority.  As part of our continuing mission to provide you with exceptional heart care, we have created designated Provider Care Teams.  These Care Teams include your primary Cardiologist (physician) and Advanced Practice Providers (APPs -  Physician Assistants and Nurse Practitioners) who all work together to provide you with the care you need, when you need it. ? ?We recommend signing up for the patient portal called "MyChart".  Sign up information is provided on this After Visit Summary.  MyChart is used to connect with patients for Virtual Visits (Telemedicine).  Patients are able to view lab/test results, encounter notes, upcoming appointments, etc.  Non-urgent messages can be sent to your provider as well.   ?To learn more about what you can do with MyChart, go to [email or web address].   ? ?Your next appointment:   ? As needed ? ?The format for your next appointment:   ?As needed. ? ? ?If primary card or EP is not listed click here to update    :1}  ? ? ? ?

## 2022-01-22 DIAGNOSIS — C44222 Squamous cell carcinoma of skin of right ear and external auricular canal: Secondary | ICD-10-CM | POA: Diagnosis not present

## 2022-01-22 DIAGNOSIS — C44319 Basal cell carcinoma of skin of other parts of face: Secondary | ICD-10-CM | POA: Diagnosis not present

## 2022-01-22 DIAGNOSIS — Z85828 Personal history of other malignant neoplasm of skin: Secondary | ICD-10-CM | POA: Diagnosis not present

## 2022-01-22 DIAGNOSIS — L821 Other seborrheic keratosis: Secondary | ICD-10-CM | POA: Diagnosis not present

## 2022-01-22 DIAGNOSIS — D485 Neoplasm of uncertain behavior of skin: Secondary | ICD-10-CM | POA: Diagnosis not present

## 2022-02-28 DIAGNOSIS — C44222 Squamous cell carcinoma of skin of right ear and external auricular canal: Secondary | ICD-10-CM | POA: Diagnosis not present

## 2022-02-28 DIAGNOSIS — Z85828 Personal history of other malignant neoplasm of skin: Secondary | ICD-10-CM | POA: Diagnosis not present

## 2022-03-06 ENCOUNTER — Ambulatory Visit (INDEPENDENT_AMBULATORY_CARE_PROVIDER_SITE_OTHER): Payer: Medicare Other | Admitting: Podiatry

## 2022-03-06 ENCOUNTER — Encounter: Payer: Self-pay | Admitting: Podiatry

## 2022-03-06 DIAGNOSIS — M79676 Pain in unspecified toe(s): Secondary | ICD-10-CM

## 2022-03-06 DIAGNOSIS — E119 Type 2 diabetes mellitus without complications: Secondary | ICD-10-CM

## 2022-03-06 DIAGNOSIS — D689 Coagulation defect, unspecified: Secondary | ICD-10-CM

## 2022-03-06 DIAGNOSIS — B351 Tinea unguium: Secondary | ICD-10-CM

## 2022-03-06 NOTE — Progress Notes (Signed)
This patient returns to my office for at risk foot care.  This patient requires this care by a professional since this patient will be at risk due to having coagulation defect and diabetes.  Patient is taking eliquiss.  This patient is unable to cut nails himself since the patient cannot reach his nails.These nails are painful walking and wearing shoes.  This patient presents for at risk foot care today.  Patient presents to the office with his wife.  General Appearance  Alert, conversant and in no acute stress.  Vascular  Dorsalis pedis and posterior tibial  pulses are weakly palpable  bilaterally.  Capillary return is within normal limits  bilaterally. Temperature is within normal limits  bilaterally.  Neurologic  Senn-Weinstein monofilament wire test within normal limits  bilaterally. Muscle power within normal limits bilaterally.  Nails Thick disfigured discolored nails with subungual debris  from hallux to fifth toes bilaterally. No evidence of bacterial infection or drainage bilaterally.  Orthopedic  No limitations of motion  feet .  No crepitus or effusions noted.  No bony pathology or digital deformities noted.  Skin  normotropic skin with no porokeratosis noted bilaterally.  No signs of infections or ulcers noted.     Onychomycosis  Pain in right toes  Pain in left toes  Consent was obtained for treatment procedures.   Mechanical debridement of nails 1-5  bilaterally performed with a nail nipper.  Filed with dremel without incident.    Return office visit     3 months                 Told patient to return for periodic foot care and evaluation due to potential at risk complications.   Eden Toohey DPM  

## 2022-04-11 DIAGNOSIS — M549 Dorsalgia, unspecified: Secondary | ICD-10-CM | POA: Diagnosis not present

## 2022-04-11 DIAGNOSIS — S0990XA Unspecified injury of head, initial encounter: Secondary | ICD-10-CM | POA: Diagnosis not present

## 2022-04-11 DIAGNOSIS — R58 Hemorrhage, not elsewhere classified: Secondary | ICD-10-CM | POA: Diagnosis not present

## 2022-04-11 DIAGNOSIS — W19XXXA Unspecified fall, initial encounter: Secondary | ICD-10-CM | POA: Diagnosis not present

## 2022-04-11 DIAGNOSIS — M545 Low back pain, unspecified: Secondary | ICD-10-CM | POA: Diagnosis not present

## 2022-04-12 ENCOUNTER — Emergency Department (HOSPITAL_COMMUNITY): Payer: Medicare Other

## 2022-04-12 ENCOUNTER — Emergency Department (HOSPITAL_COMMUNITY)
Admission: EM | Admit: 2022-04-12 | Discharge: 2022-04-12 | Disposition: A | Discharge status: Home / Self Care | Payer: Medicare Other | Attending: Emergency Medicine | Admitting: Emergency Medicine

## 2022-04-12 DIAGNOSIS — I2510 Atherosclerotic heart disease of native coronary artery without angina pectoris: Secondary | ICD-10-CM | POA: Diagnosis not present

## 2022-04-12 DIAGNOSIS — S3992XA Unspecified injury of lower back, initial encounter: Secondary | ICD-10-CM | POA: Diagnosis present

## 2022-04-12 DIAGNOSIS — I10 Essential (primary) hypertension: Secondary | ICD-10-CM | POA: Diagnosis not present

## 2022-04-12 DIAGNOSIS — E119 Type 2 diabetes mellitus without complications: Secondary | ICD-10-CM | POA: Diagnosis not present

## 2022-04-12 DIAGNOSIS — S299XXA Unspecified injury of thorax, initial encounter: Secondary | ICD-10-CM | POA: Diagnosis not present

## 2022-04-12 DIAGNOSIS — S0990XA Unspecified injury of head, initial encounter: Secondary | ICD-10-CM | POA: Diagnosis not present

## 2022-04-12 DIAGNOSIS — S32039A Unspecified fracture of third lumbar vertebra, initial encounter for closed fracture: Secondary | ICD-10-CM | POA: Insufficient documentation

## 2022-04-12 DIAGNOSIS — Z7901 Long term (current) use of anticoagulants: Secondary | ICD-10-CM | POA: Diagnosis not present

## 2022-04-12 DIAGNOSIS — W1830XA Fall on same level, unspecified, initial encounter: Secondary | ICD-10-CM | POA: Diagnosis not present

## 2022-04-12 DIAGNOSIS — R519 Headache, unspecified: Secondary | ICD-10-CM | POA: Diagnosis not present

## 2022-04-12 DIAGNOSIS — S199XXA Unspecified injury of neck, initial encounter: Secondary | ICD-10-CM | POA: Diagnosis not present

## 2022-04-12 DIAGNOSIS — S32030A Wedge compression fracture of third lumbar vertebra, initial encounter for closed fracture: Secondary | ICD-10-CM | POA: Diagnosis not present

## 2022-04-12 DIAGNOSIS — Z20822 Contact with and (suspected) exposure to covid-19: Secondary | ICD-10-CM | POA: Diagnosis not present

## 2022-04-12 DIAGNOSIS — Z96643 Presence of artificial hip joint, bilateral: Secondary | ICD-10-CM | POA: Insufficient documentation

## 2022-04-12 DIAGNOSIS — G319 Degenerative disease of nervous system, unspecified: Secondary | ICD-10-CM | POA: Diagnosis not present

## 2022-04-12 DIAGNOSIS — Z79899 Other long term (current) drug therapy: Secondary | ICD-10-CM | POA: Insufficient documentation

## 2022-04-12 DIAGNOSIS — Z043 Encounter for examination and observation following other accident: Secondary | ICD-10-CM | POA: Diagnosis not present

## 2022-04-12 DIAGNOSIS — I739 Peripheral vascular disease, unspecified: Secondary | ICD-10-CM | POA: Diagnosis not present

## 2022-04-12 LAB — I-STAT CHEM 8, ED
BUN: 21 mg/dL (ref 8–23)
Calcium, Ion: 1.12 mmol/L — ABNORMAL LOW (ref 1.15–1.40)
Chloride: 104 mmol/L (ref 98–111)
Creatinine, Ser: 1 mg/dL (ref 0.61–1.24)
Glucose, Bld: 211 mg/dL — ABNORMAL HIGH (ref 70–99)
HCT: 45 % (ref 39.0–52.0)
Hemoglobin: 15.3 g/dL (ref 13.0–17.0)
Potassium: 4.2 mmol/L (ref 3.5–5.1)
Sodium: 141 mmol/L (ref 135–145)
TCO2: 26 mmol/L (ref 22–32)

## 2022-04-12 LAB — PROTIME-INR
INR: 1.3 — ABNORMAL HIGH (ref 0.8–1.2)
Prothrombin Time: 16.3 seconds — ABNORMAL HIGH (ref 11.4–15.2)

## 2022-04-12 LAB — COMPREHENSIVE METABOLIC PANEL WITH GFR
ALT: 49 U/L — ABNORMAL HIGH (ref 0–44)
AST: 46 U/L — ABNORMAL HIGH (ref 15–41)
Albumin: 3.6 g/dL (ref 3.5–5.0)
Alkaline Phosphatase: 113 U/L (ref 38–126)
Anion gap: 9 (ref 5–15)
BUN: 16 mg/dL (ref 8–23)
CO2: 25 mmol/L (ref 22–32)
Calcium: 9.4 mg/dL (ref 8.9–10.3)
Chloride: 108 mmol/L (ref 98–111)
Creatinine, Ser: 1.02 mg/dL (ref 0.61–1.24)
GFR, Estimated: >60 mL/min (ref >60)
Glucose, Bld: 214 mg/dL — ABNORMAL HIGH (ref 70–99)
Potassium: 4.1 mmol/L (ref 3.5–5.1)
Sodium: 142 mmol/L (ref 135–145)
Total Bilirubin: 0.9 mg/dL (ref 0.3–1.2)
Total Protein: 6 g/dL — ABNORMAL LOW (ref 6.5–8.1)

## 2022-04-12 LAB — SAMPLE TO BLOOD BANK

## 2022-04-12 LAB — CBC
HCT: 45.7 % (ref 39.0–52.0)
Hemoglobin: 15 g/dL (ref 13.0–17.0)
MCH: 29.8 pg (ref 26.0–34.0)
MCHC: 32.8 g/dL (ref 30.0–36.0)
MCV: 90.7 fL (ref 80.0–100.0)
Platelets: 171 K/uL (ref 150–400)
RBC: 5.04 MIL/uL (ref 4.22–5.81)
RDW: 13.5 % (ref 11.5–15.5)
WBC: 7.9 K/uL (ref 4.0–10.5)
nRBC: 0 % (ref 0.0–0.2)

## 2022-04-12 LAB — RESP PANEL BY RT-PCR (FLU A&B, COVID) ARPGX2
Influenza A by PCR: NEGATIVE
Influenza B by PCR: NEGATIVE
SARS Coronavirus 2 by RT PCR: NEGATIVE

## 2022-04-12 LAB — ETHANOL: Alcohol, Ethyl (B): <10 mg/dL (ref <10)

## 2022-04-12 LAB — LACTIC ACID, PLASMA: Lactic Acid, Venous: 2.4 mmol/L (ref 0.5–1.9)

## 2022-04-12 NOTE — Progress Notes (Signed)
Orthopedic Tech Progress Note Patient Details:  Jonathan [name] [birth year] [id number]  Ortho Devices Type of Ortho Device: Thoracolumbar corset (TLSO) Ortho Device/Splint Location: back Ortho Device/Splint Interventions: Ordered      [name] L Sai Moura 04/12/2022, 4:24 AM

## 2022-04-12 NOTE — ED Provider Notes (Signed)
[name] Provider Note MRN:  [id number]  Arrival date & time: 04/12/22     Chief Complaint   Fall   History of Present Illness   [name] is a 88 y.o. year-old male with a history of A-fib, CAD presenting to the ED with chief complaint of fall.  Trip and fall at home witnessed by wife.  [place] backwards and hit the back of his head.  Endorsing low back pain.  Uses blood thinners.  Denies headache or neck pain, no chest pain, no abdominal pain, no injuries to the arms or legs.  Review of Systems  A thorough review of systems was obtained and all systems are negative except as noted in the HPI and PMH.   Patient's Health History    Past Medical History:  Diagnosis Date   Acne rosacea    Arthritis    Atrial fibrillation (HCC)    Borderline diabetes    CAD (coronary artery disease)    Diabetes mellitus without complication (HCC)    Diverticulosis    Elevated PSA    Esophageal stricture    GERD (gastroesophageal reflux disease)    History of nuclear stress test    Nuclear stress test 6/18: EF 4, small apical defect (?old MI); no ischemia; Low Risk   Hypertension    Myocardial infarction ([place])    Neck fracture ([place]) 2015   PONV (postoperative nausea and vomiting) 08/09/2014   Shoulder pain     Past Surgical History:  Procedure Laterality Date   BALLOON DILATION  04/02/2012   Procedure: BALLOON DILATION;  Surgeon: Arta Silence, MD;  Location: [facility];  Service: Endoscopy;  Laterality: N/A;   BALLOON DILATION N/A 06/16/2013   Procedure: BALLOON DILATION;  Surgeon: Arta Silence, MD;  Location: [facility];  Service: Endoscopy;  Laterality: N/A;   BALLOON DILATION N/A 11/16/2014   Procedure: BALLOON DILATION;  Surgeon: Arta Silence, MD;  Location: [facility];  Service: Endoscopy;  Laterality: N/A;   BALLOON DILATION N/A 02/13/2017   Procedure: BALLOON DILATION;  Surgeon: Arta Silence, MD;  Location: [facility];   Service: Endoscopy;  Laterality: N/A;   CORNEAL TRANSPLANT     ESOPHAGOGASTRODUODENOSCOPY  04/02/2012   Procedure: ESOPHAGOGASTRODUODENOSCOPY (EGD);  Surgeon: Arta Silence, MD;  Location: [facility];  Service: Endoscopy;  Laterality: N/A;   ESOPHAGOGASTRODUODENOSCOPY N/A 05/28/2013   Procedure: ESOPHAGOGASTRODUODENOSCOPY (EGD);  Surgeon: Cleotis Nipper, MD;  Location: [facility];  Service: Endoscopy;  Laterality: N/A;   ESOPHAGOGASTRODUODENOSCOPY N/A 02/20/2015   Procedure: ESOPHAGOGASTRODUODENOSCOPY (EGD);  Surgeon: Arta Silence, MD;  Location: [facility];  Service: Endoscopy;  Laterality: N/A;   ESOPHAGOGASTRODUODENOSCOPY (EGD) WITH PROPOFOL N/A 06/16/2013   Procedure: ESOPHAGOGASTRODUODENOSCOPY (EGD) WITH PROPOFOL;  Surgeon: Arta Silence, MD;  Location: [facility];  Service: Endoscopy;  Laterality: N/A;   ESOPHAGOGASTRODUODENOSCOPY (EGD) WITH PROPOFOL N/A 11/16/2014   Procedure: ESOPHAGOGASTRODUODENOSCOPY (EGD) WITH PROPOFOL;  Surgeon: Arta Silence, MD;  Location: [facility];  Service: Endoscopy;  Laterality: N/A;   ESOPHAGOGASTRODUODENOSCOPY (EGD) WITH PROPOFOL N/A 02/13/2017   Procedure: ESOPHAGOGASTRODUODENOSCOPY (EGD) WITH PROPOFOL;  Surgeon: Arta Silence, MD;  Location: [facility];  Service: Endoscopy;  Laterality: N/A;   EYE SURGERY     cataract sx   HERNIA REPAIR     x 2   TOTAL HIP ARTHROPLASTY     bilateral    Family History  Family history unknown: Yes    Social History   Socioeconomic History   Marital status: Married    Spouse name: [name]  Number of children: Not on file   Years of education: Not on file   Highest education level: Some college, no degree  Occupational History   Not on file  Tobacco Use   Smoking status: Never   Smokeless tobacco: Never  Vaping Use   Vaping Use: Never used  Substance and Sexual Activity   Alcohol use: No   Drug use: No   Sexual activity: Not on file  Other Topics Concern   Not on file  Social History Narrative    09/21/20 lives with wife   Social Determinants of Health   Financial Resource Strain: Not on file  Food Insecurity: Not on file  Transportation Needs: Not on file  Physical Activity: Not on file  Stress: Not on file  Social Connections: Not on file  Intimate Partner Violence: Not on file     Physical Exam   Vitals:   04/12/22 0345 04/12/22 0400  BP: (!) 159/75 (!) 160/73  Pulse: 67 68  Resp: 20 16  Temp:    SpO2: 98% 98%    CONSTITUTIONAL: Well-appearing, NAD NEURO/PSYCH:  Alert and oriented x 3, no focal deficits EYES:  eyes equal and reactive ENT/NECK:  no LAD, no JVD CARDIO: Regular rate, well-perfused, normal S1 and S2 PULM:  CTAB no wheezing or rhonchi GI/GU:  non-distended, non-tender MSK/SPINE:  No gross deformities, no edema SKIN:  no rash, atraumatic   *Additional and/or pertinent findings included in MDM below  Diagnostic and Interventional Summary    EKG Interpretation  Date/Time:  Friday April 12 2022 00:16:12 EDT Ventricular Rate:  63 PR Interval:    QRS Duration: 102 QT Interval:  390 QTC Calculation: 400 R Axis:   76 Text Interpretation: Atrial fibrillation Borderline repolarization abnormality Confirmed by Gerlene Fee [phone]) on 04/12/2022 1:10:05 AM       Labs Reviewed  COMPREHENSIVE METABOLIC PANEL - Abnormal; Notable for the following components:      Result Value   Glucose, Bld 214 (*)    Total Protein 6.0 (*)    AST 46 (*)    ALT 49 (*)    All other components within normal limits  LACTIC ACID, PLASMA - Abnormal; Notable for the following components:   Lactic Acid, Venous 2.4 (*)    All other components within normal limits  PROTIME-INR - Abnormal; Notable for the following components:   Prothrombin Time 16.3 (*)    INR 1.3 (*)    All other components within normal limits  I-STAT CHEM 8, ED - Abnormal; Notable for the following components:   Glucose, Bld 211 (*)    Calcium, Ion 1.12 (*)    All other components within normal  limits  RESP PANEL BY RT-PCR (FLU A&B, COVID) ARPGX2  CBC  ETHANOL  SAMPLE TO BLOOD BANK    CT HEAD WO CONTRAST  Final Result    CT CERVICAL SPINE WO CONTRAST  Final Result    CT Lumbar Spine Wo Contrast  Final Result    DG Chest Port 1 View  Final Result    DG Pelvis Portable  Final Result      Medications - No data to display   Procedures  /  Critical Care Procedures  ED Course and Medical Decision Making  Initial Impression and Ddx Mechanical fall, anticoagulated, head trauma.  A bit somnolent but wakes easily and follows commands.  Awaiting CT imaging.  Abdomen soft and nontender, no chest pain or shortness of breath, no hypoxia, little to no  concern for intrathoracic or intra-abdominal injury.  Past medical/surgical history that increases complexity of ED encounter: None  Interpretation of Diagnostics I personally reviewed the EKG and my interpretation is as follows: A-fib  Labs reveal no significant blood count or electrolyte disturbance, minimally elevated lactate.  CT imaging revealing L3 fracture, otherwise no injuries  Patient Reassessment and Ultimate Disposition/Management     Case discussed with Dr. Christella Noa of neurosurgery, patient has no neurological deficits, recommending TLSO and follow-up as an outpatient.  Evaluation by case management and physical therapy offered to patient and patient's wife, declined.  They are comfortable going home, they have home health and they have a son who lives next door.  Patient management required discussion with the following services or consulting groups:  Neurosurgery  Complexity of Problems Addressed Acute illness or injury that poses threat of life of bodily function  Additional Data Reviewed and Analyzed Further history obtained from: Further history from spouse/family member  Additional Factors Impacting ED Encounter Risk Consideration of hospitalization  Barth Kirks. Sedonia Small, [place] [email or web address]  Final Clinical Impressions(s) / ED Diagnoses     ICD-10-CM   1. Closed fracture of third lumbar vertebra, unspecified fracture morphology, initial encounter [facility])  T24.580D       ED Discharge Orders     None        Discharge Instructions Discussed with and Provided to Patient:     Discharge Instructions      You were evaluated in the Emergency Department and after careful evaluation, we did not find any emergent condition requiring admission or further testing in the [name].  Your exam/testing today was overall reassuring.  Wear the brace as instructed by the neurosurgeon and follow-up in the office.  Use Tylenol at home for pain.  Please return to the Emergency Department if you experience any worsening of your condition.  Thank you for allowing [place] to be a part of your care.        Maudie Flakes, MD 04/12/22 [phone]

## 2022-04-12 NOTE — ED Triage Notes (Signed)
Pt w mechanical fall while using walker, hit his head on doorframe, continued to fall to ground. Lac to center/back of head, no other obvious deformity. -LOC, c/o lower back pain, a&o1- baseline. Ccollar prior to arrival.   124/78 68, hx afib Cbg 217

## 2022-04-12 NOTE — ED Notes (Addendum)
Ccollar removed by EDP after palpation of c-spine. Pt denies pain, able to move neck freely

## 2022-04-12 NOTE — Discharge Instructions (Signed)
You were evaluated in the Emergency Department and after careful evaluation, we did not find any emergent condition requiring admission or further testing in the hospital.  Your exam/testing today was overall reassuring.  Wear the brace as instructed by the neurosurgeon and follow-up in the office.  Use Tylenol at home for pain.  Please return to the Emergency Department if you experience any worsening of your condition.  Thank you for allowing [place] to be a part of your care.

## 2022-04-12 NOTE — Consult Note (Signed)
Reason for Consult:L3 compression fracture Referring Physician: ED  Jonathan [name] is an 88 y.o. male.  HPI: whom fell while using his walker late last night. Arrived at [facility] [place]. Complained of low back pain. L3 compression fracture noted. It is not clear that this is an acute injury. He has no neurological deficits.   Past Medical History:  Diagnosis Date   Acne rosacea    Arthritis    Atrial fibrillation (HCC)    Borderline diabetes    CAD (coronary artery disease)    Diabetes mellitus without complication (HCC)    Diverticulosis    Elevated PSA    Esophageal stricture    GERD (gastroesophageal reflux disease)    History of nuclear stress test    Nuclear stress test 6/18: EF 54, small apical defect (?old MI); no ischemia; Low Risk   Hypertension    Myocardial infarction ([place])    Neck fracture ([place]) 2015   PONV (postoperative nausea and vomiting) 08/09/2014   Shoulder pain     Past Surgical History:  Procedure Laterality Date   BALLOON DILATION  04/02/2012   Procedure: BALLOON DILATION;  Surgeon: Arta Silence, MD;  Location: [facility];  Service: Endoscopy;  Laterality: N/A;   BALLOON DILATION N/A 06/16/2013   Procedure: BALLOON DILATION;  Surgeon: Arta Silence, MD;  [facility];  Service: Endoscopy;  Laterality: N/A;   BALLOON DILATION N/A 11/16/2014   Procedure: BALLOON DILATION;  Surgeon: Arta Silence, MD;  [facility];  Service: Endoscopy;  Laterality: N/A;   BALLOON DILATION N/A 02/13/2017   Procedure: BALLOON DILATION;  Surgeon: Arta Silence, MD;  Location: [facility] ENDOSCOPY;  Service: Endoscopy;  Laterality: N/A;   CORNEAL TRANSPLANT     ESOPHAGOGASTRODUODENOSCOPY  04/02/2012   Procedure: ESOPHAGOGASTRODUODENOSCOPY (EGD);  Surgeon: Arta Silence, MD;  [facility];  Service: Endoscopy;  Laterality: N/A;   ESOPHAGOGASTRODUODENOSCOPY N/A 05/28/2013   Procedure: ESOPHAGOGASTRODUODENOSCOPY (EGD);  Surgeon: Cleotis Nipper, MD;   Location: [facility];  Service: Endoscopy;  Laterality: N/A;   ESOPHAGOGASTRODUODENOSCOPY N/A 02/20/2015   Procedure: ESOPHAGOGASTRODUODENOSCOPY (EGD);  Surgeon: Arta Silence, MD;  Location: [facility];  Service: Endoscopy;  Laterality: N/A;   ESOPHAGOGASTRODUODENOSCOPY (EGD) WITH PROPOFOL N/A 06/16/2013   Procedure: ESOPHAGOGASTRODUODENOSCOPY (EGD) WITH PROPOFOL;  Surgeon: Arta Silence, MD;  [facility];  Service: Endoscopy;  Laterality: N/A;   ESOPHAGOGASTRODUODENOSCOPY (EGD) WITH PROPOFOL N/A 11/16/2014   Procedure: ESOPHAGOGASTRODUODENOSCOPY (EGD) WITH PROPOFOL;  Surgeon: Arta Silence, MD;  [facility];  Service: Endoscopy;  Laterality: N/A;   ESOPHAGOGASTRODUODENOSCOPY (EGD) WITH PROPOFOL N/A 02/13/2017   Procedure: ESOPHAGOGASTRODUODENOSCOPY (EGD) WITH PROPOFOL;  Surgeon: Arta Silence, MD;  Location: [facility];  Service: Endoscopy;  Laterality: N/A;   EYE SURGERY     cataract sx   HERNIA REPAIR     x 2   TOTAL HIP ARTHROPLASTY     bilateral    Family History  Family history unknown: Yes    Social History:  reports that he has never smoked. He has never used smokeless tobacco. He reports that he does not drink alcohol and does not use drugs.  Allergies: No Known Allergies  Medications: I have reviewed the patient's current medications.  Results for orders placed or performed during the hospital encounter of 04/12/22 (from the past 48 hour(s))  Resp Panel by RT-PCR (Flu A&B, Covid) Anterior Nasal Swab     Status: None   Collection Time: 04/12/22 12:15 AM   Specimen: Anterior Nasal Swab  Result Value Ref Range  SARS Coronavirus 2 by RT PCR NEGATIVE NEGATIVE    Comment: (NOTE) SARS-CoV-2 target nucleic acids are NOT DETECTED.  The SARS-CoV-2 RNA is generally detectable in upper respiratory specimens during the acute phase of infection. The lowest concentration of SARS-CoV-2 viral copies this assay can detect is 138 copies/mL. A negative result  does not preclude SARS-Cov-2 infection and should not be used as the sole basis for treatment or other patient management decisions. A negative result may occur with  improper specimen collection/handling, submission of specimen other than nasopharyngeal swab, presence of viral mutation(s) within the areas targeted by this assay, and inadequate number of viral copies(<138 copies/mL). A negative result must be combined with clinical observations, patient history, and epidemiological information. The expected result is Negative.  Fact Sheet for Patients:  EntrepreneurPulse.com.au  Fact Sheet for Healthcare Providers:  IncredibleEmployment.be  This test is no t yet approved or cleared by the [place] FDA and  has been authorized for detection and/or diagnosis of SARS-CoV-2 by FDA under an Emergency Use Authorization (EUA). This EUA will remain  in effect (meaning this test can be used) for the duration of the COVID-19 declaration under Section 564(b)(1) of the Act, 21 U.S.C.section 360bbb-3(b)(1), unless the authorization is terminated  or revoked sooner.       Influenza A by PCR NEGATIVE NEGATIVE   Influenza B by PCR NEGATIVE NEGATIVE    Comment: (NOTE) The Xpert Xpress SARS-CoV-2/FLU/RSV plus assay is intended as an aid in the diagnosis of influenza from Nasopharyngeal swab specimens and should not be used as a sole basis for treatment. Nasal washings and aspirates are unacceptable for Xpert Xpress SARS-CoV-2/FLU/RSV testing.  Fact Sheet for Patients: EntrepreneurPulse.com.au  Fact Sheet for Healthcare Providers: IncredibleEmployment.be  This test is not yet approved or cleared by the [place] FDA and has been authorized for detection and/or diagnosis of SARS-CoV-2 by FDA under an Emergency Use Authorization (EUA). This EUA will remain in effect (meaning this test can be used) for the duration  of the COVID-19 declaration under Section 564(b)(1) of the Act, 21 U.S.C. section 360bbb-3(b)(1), unless the authorization is terminated or revoked.  Performed at [facility], [place] [address], [place], [place] [address]   Sample to Blood Bank     Status: None   Collection Time: 04/12/22 12:15 AM  Result Value Ref Range   Blood Bank Specimen SAMPLE AVAILABLE FOR TESTING    Sample Expiration      04/13/2022,2359 Performed at [facility], [place] [address] [place],  [address]   Comprehensive metabolic panel     Status: Abnormal   Collection Time: 04/12/22 12:16 AM  Result Value Ref Range   Sodium 142 135 - 145 mmol/L   Potassium 4.1 3.5 - 5.1 mmol/L   Chloride 108 98 - 111 mmol/L   CO2 25 22 - 32 mmol/L   Glucose, Bld 214 (H) 70 - 99 mg/dL    Comment: Glucose reference range applies only to samples taken after fasting for at least 8 hours.   BUN 16 8 - 23 mg/dL   Creatinine, Ser 1.02 0.61 - 1.24 mg/dL   Calcium 9.4 8.9 - 10.3 mg/dL   Total Protein 6.0 (L) 6.5 - 8.1 g/dL   Albumin 3.6 3.5 - 5.0 g/dL   AST 46 (H) 15 - 41 U/L   ALT 49 (H) 0 - 44 U/L   Alkaline Phosphatase 113 38 - 126 U/L   Total Bilirubin 0.9 0.3 - 1.2 mg/dL  GFR, Estimated >60 >60 mL/min    Comment: (NOTE) Calculated using the CKD-EPI Creatinine Equation (2021)    Anion gap 9 5 - 15    Comment: Performed at [facility], [place] [address] [place] [address]  CBC     Status: None   Collection Time: 04/12/22 12:16 AM  Result Value Ref Range   WBC 7.9 4.0 - 10.5 K/uL   RBC 5.04 4.22 - 5.81 MIL/uL   Hemoglobin 15.0 13.0 - 17.0 g/dL   HCT 45.7 39.0 - 52.0 %   MCV 90.7 80.0 - 100.0 fL   MCH 29.8 26.0 - 34.0 pg   MCHC 32.8 30.0 - 36.0 g/dL   RDW 13.5 11.5 - 15.5 %   Platelets 171 150 - 400 K/uL   nRBC 0.0 0.0 - 0.2 %    Comment: Performed at [facility], [place] [address], [place], [place] [address]  Ethanol     Status: None   Collection Time: 04/12/22 12:16 AM  Result  Value Ref Range   Alcohol, Ethyl (B) <10 <10 mg/dL    Comment: (NOTE) Lowest detectable limit for serum alcohol is 10 mg/dL.  For medical purposes only. Performed at [facility], [place] [address], [place], [place] [address]   Lactic acid, plasma     Status: Abnormal   Collection Time: 04/12/22 12:16 AM  Result Value Ref Range   Lactic Acid, Venous 2.4 (HH) 0.5 - 1.9 mmol/L    Comment: CRITICAL RESULT CALLED TO, READ BACK BY AND VERIFIED WITH: RUGGIERO M,RN 04/12/22 0106 WAYK Performed at [facility], [place] [address], [place], [place] [address]   Protime-INR     Status: Abnormal   Collection Time: 04/12/22 12:16 AM  Result Value Ref Range   Prothrombin Time 16.3 (H) 11.4 - 15.2 seconds   INR 1.3 (H) 0.8 - 1.2    Comment: (NOTE) INR goal varies based on device and disease states. Performed at [facility], [place] [address] [place], [place] [address]   I-Stat Chem 8, ED     Status: Abnormal   Collection Time: 04/12/22 12:31 AM  Result Value Ref Range   Sodium 141 135 - 145 mmol/L   Potassium 4.2 3.5 - 5.1 mmol/L   Chloride 104 98 - 111 mmol/L   BUN 21 8 - 23 mg/dL   Creatinine, Ser 1.00 0.61 - 1.24 mg/dL   Glucose, Bld 211 (H) 70 - 99 mg/dL    Comment: Glucose reference range applies only to samples taken after fasting for at least 8 hours.   Calcium, Ion 1.12 (L) 1.15 - 1.40 mmol/L   TCO2 26 22 - 32 mmol/L   Hemoglobin 15.3 13.0 - 17.0 g/dL   HCT 45.0 39.0 - 52.0 %    CT Lumbar Spine Wo Contrast  Result Date: 04/12/2022 CLINICAL DATA:  Initial evaluation for acute low back pain status post fall. EXAM: CT LUMBAR SPINE WITHOUT CONTRAST TECHNIQUE: Multidetector CT imaging of the lumbar spine was performed without intravenous contrast administration. Multiplanar CT image reconstructions were also generated. RADIATION DOSE REDUCTION: This exam was performed according to the departmental dose-optimization program which includes automated exposure control,  adjustment of the mA and/or kV according to patient size and/or use of iterative reconstruction technique. COMPARISON:  None Available. FINDINGS: Segmentation: Standard. Lowest well-formed disc space labeled the L5-S1 level. Alignment: Trace degenerative retrolisthesis of L1 on L2. Alignment otherwise normal preservation of the normal lumbar lordosis.  Vertebrae: Osteopenia noted. Subtle acute fracture involving the L3 vertebral body is seen. Fracture extends through both the superior and inferior endplates. No more than mild central height loss without bony retropulsion. Vertebral body height otherwise maintained with no other acute or chronic fracture. Visualized sacrum and pelvis intact. Probable benign bone island noted involving the right posterior ninth rib. No other discrete or worrisome osseous lesions. Paraspinal and other soft tissues: Mild hazy edema noted adjacent to the acute L3 fracture. Paraspinous soft tissues demonstrate no other acute finding. Sebaceous cyst along the midline of the lower back measures 5.0 x 2.7 cm. Asymmetric atrophy noted involving the right psoas and iliacus musculature. Aorto bi-iliac atherosclerotic disease. Cholelithiasis. Punctate nonobstructive left renal nephrolithiasis. Disc levels: Relatively mild for age multilevel degenerative spondylosis, most pronounced at L5-S1. Lower lumbar facet hypertrophy. No significant spinal stenosis. Mild to moderate bilateral foraminal narrowing at L3-4 through L5-S1. IMPRESSION: 1. Acute fracture involving the L3 vertebral body. No more than mild central height loss without bony retropulsion. 2. Underlying mild for age degenerative spondylosis without significant spinal stenosis. 3. Cholelithiasis. 4. Punctate nonobstructive left renal nephrolithiasis. 5. Aortic Atherosclerosis (ICD10-I70.0). Electronically Signed   By: Jeannine Boga M.D.   On: 04/12/2022 01:57   CT CERVICAL SPINE WO CONTRAST  Result Date: 04/12/2022 CLINICAL  DATA:  Polytrauma, blunt.  Fall. EXAM: CT CERVICAL SPINE WITHOUT CONTRAST TECHNIQUE: Multidetector CT imaging of the cervical spine was performed without intravenous contrast. Multiplanar CT image reconstructions were also generated. RADIATION DOSE REDUCTION: This exam was performed according to the departmental dose-optimization program which includes automated exposure control, adjustment of the mA and/or kV according to patient size and/or use of iterative reconstruction technique. COMPARISON:  10/12/2018 FINDINGS: Alignment: No subluxation Skull base and vertebrae: No acute fracture. No primary bone lesion or focal pathologic process. Soft tissues and spinal canal: No prevertebral fluid or swelling. No visible canal hematoma. Disc levels: Diffuse bilateral degenerative facet disease, right greater than left. Early degenerative disc disease. Upper chest: No acute findings Other: None IMPRESSION: No acute bony abnormality. Electronically Signed   By: Rolm Baptise M.D.   On: 04/12/2022 01:12   CT HEAD WO CONTRAST  Result Date: 04/12/2022 CLINICAL DATA:  Head trauma, moderate-severe.  Fall. EXAM: CT HEAD WITHOUT CONTRAST TECHNIQUE: Contiguous axial images were obtained from the base of the skull through the vertex without intravenous contrast. RADIATION DOSE REDUCTION: This exam was performed according to the departmental dose-optimization program which includes automated exposure control, adjustment of the mA and/or kV according to patient size and/or use of iterative reconstruction technique. COMPARISON:  None Available. FINDINGS: Brain: There is atrophy and chronic small vessel disease changes. No acute intracranial abnormality. Specifically, no hemorrhage, hydrocephalus, mass lesion, acute infarction, or significant intracranial injury. Vascular: No hyperdense vessel or unexpected calcification. Skull: No acute calvarial abnormality. Sinuses/Orbits: No acute findings Other: None IMPRESSION: Atrophy, chronic  microvascular disease. No acute intracranial abnormality. Electronically Signed   By: Rolm Baptise M.D.   On: 04/12/2022 01:11   DG Pelvis Portable  Result Date: 04/12/2022 CLINICAL DATA:  Fall.  On blood thinners. EXAM: PORTABLE PELVIS 1-2 VIEWS COMPARISON:  08/29/2018 FINDINGS: Bilateral total hip arthroplasties. Inferior portions of the femoral stems are not included within the field of view but visualized portions appear intact without change in position or appearance since previous study. No evidence of acute fracture or dislocation. SI joints and symphysis pubis are not displaced. Degenerative changes in the lower lumbar spine. IMPRESSION: 1. No acute displaced  fractures identified. 2. Bilateral hip arthroplasties appear well unchanged. Electronically Signed   By: Lucienne Capers M.D.   On: 04/12/2022 00:38   DG Chest Port 1 View  Result Date: 04/12/2022 CLINICAL DATA:  Level 2 trauma.  Fall. EXAM: PORTABLE CHEST 1 VIEW COMPARISON:  10/12/2018 FINDINGS: Shallow inspiration. Heart size and pulmonary vascularity are normal for technique. Fine interstitial pattern to the lungs appears similar to prior study and likely represents fibrosis. No focal consolidation. No pleural effusions. No pneumothorax. Mediastinal contours appear intact. Calcification of the aorta. IMPRESSION: Slight fibrosis in the lungs.  No evidence of active lung disease. Electronically Signed   By: Lucienne Capers M.D.   On: 04/12/2022 00:37    Review of Systems  Constitutional: Negative.   HENT: Negative.    Eyes: Negative.   Respiratory: Negative.    Cardiovascular: Negative.   Gastrointestinal: Negative.   Endocrine: Negative.   Genitourinary: Negative.   Musculoskeletal:  Positive for back pain.  Skin: Negative.   Allergic/Immunologic: Negative.   Neurological: Negative.   Hematological: Negative.   Psychiatric/Behavioral: Negative.     Blood pressure (!) 138/94, pulse (!) 58, temperature 98.3 F (36.8 C),  temperature source Oral, resp. rate 13, height '5\' 7"'$  (1.702 m), weight 73.4 kg, SpO2 97 %. Physical Exam Constitutional:      General: He is in acute distress.  HENT:     Head: Normocephalic and atraumatic.     Right Ear: External ear normal.     Left Ear: External ear normal.     Nose: Nose normal.     Mouth/Throat:     Mouth: Mucous membranes are moist.     Pharynx: Oropharynx is clear.  Eyes:     Extraocular Movements: Extraocular movements intact.     Conjunctiva/sclera: Conjunctivae normal.     Pupils: Pupils are equal, round, and reactive to light.  Pulmonary:     Effort: Pulmonary effort is normal.  Abdominal:     General: Abdomen is flat.  Musculoskeletal:        General: Normal range of motion.     Cervical back: Normal range of motion.  Skin:    General: Skin is warm and dry.  Neurological:     General: No focal deficit present.     Mental Status: He is alert and oriented to person, place, and time. Mental status is at baseline.     Cranial Nerves: No cranial nerve deficit.     Motor: No weakness.  Psychiatric:        Mood and Affect: Mood normal.        Behavior: Behavior normal.        Thought Content: Thought content normal.        Judgment: Judgment normal.     Assessment/Plan: Jonathan [name] is a 88 y.o. male With a possible minimal acute fracture of L3. There is no displacement in the spine. There is no operative indication. Recommend an LSO when out of bed. Also recommend holding eliquis this weekend. Can restart his dose on Monday. Will see in the office in 3 weeks. No neurological deficits identified.   Ashok Pall 04/12/2022, 3:01 AM

## 2022-04-12 NOTE — Progress Notes (Signed)
Orthopedic Tech Progress Note Patient Details:  Jonathan [name] 28-Jul-1931 [id number]  Patient ID: Jonathan [name], male   DOB: 1931-06-16, 88 y.o.   MRN: [id number] Level 2 trauma not needed. Jonathan [name] Jonathan [name] 04/12/2022, 2:15 AM

## 2022-04-17 DIAGNOSIS — N400 Enlarged prostate without lower urinary tract symptoms: Secondary | ICD-10-CM | POA: Diagnosis not present

## 2022-04-17 DIAGNOSIS — Z8249 Family history of ischemic heart disease and other diseases of the circulatory system: Secondary | ICD-10-CM | POA: Diagnosis not present

## 2022-04-17 DIAGNOSIS — I252 Old myocardial infarction: Secondary | ICD-10-CM | POA: Diagnosis not present

## 2022-04-17 DIAGNOSIS — R627 Adult failure to thrive: Secondary | ICD-10-CM | POA: Diagnosis present

## 2022-04-17 DIAGNOSIS — E785 Hyperlipidemia, unspecified: Secondary | ICD-10-CM | POA: Diagnosis present

## 2022-04-17 DIAGNOSIS — R1312 Dysphagia, oropharyngeal phase: Secondary | ICD-10-CM | POA: Diagnosis not present

## 2022-04-17 DIAGNOSIS — Z515 Encounter for palliative care: Secondary | ICD-10-CM | POA: Diagnosis not present

## 2022-04-17 DIAGNOSIS — I10 Essential (primary) hypertension: Secondary | ICD-10-CM | POA: Diagnosis not present

## 2022-04-17 DIAGNOSIS — R2681 Unsteadiness on feet: Secondary | ICD-10-CM | POA: Diagnosis not present

## 2022-04-17 DIAGNOSIS — Z833 Family history of diabetes mellitus: Secondary | ICD-10-CM | POA: Diagnosis not present

## 2022-04-17 DIAGNOSIS — R1310 Dysphagia, unspecified: Secondary | ICD-10-CM | POA: Diagnosis not present

## 2022-04-17 DIAGNOSIS — R638 Other symptoms and signs concerning food and fluid intake: Secondary | ICD-10-CM | POA: Diagnosis not present

## 2022-04-17 DIAGNOSIS — Z66 Do not resuscitate: Secondary | ICD-10-CM | POA: Diagnosis present

## 2022-04-17 DIAGNOSIS — R051 Acute cough: Secondary | ICD-10-CM | POA: Diagnosis not present

## 2022-04-17 DIAGNOSIS — F0280 Dementia in other diseases classified elsewhere without behavioral disturbance: Secondary | ICD-10-CM | POA: Diagnosis not present

## 2022-04-17 DIAGNOSIS — J690 Pneumonitis due to inhalation of food and vomit: Secondary | ICD-10-CM | POA: Diagnosis not present

## 2022-04-17 DIAGNOSIS — R279 Unspecified lack of coordination: Secondary | ICD-10-CM | POA: Diagnosis not present

## 2022-04-17 DIAGNOSIS — F05 Delirium due to known physiological condition: Secondary | ICD-10-CM | POA: Diagnosis not present

## 2022-04-17 DIAGNOSIS — S32029A Unspecified fracture of second lumbar vertebra, initial encounter for closed fracture: Secondary | ICD-10-CM | POA: Diagnosis not present

## 2022-04-17 DIAGNOSIS — Z79899 Other long term (current) drug therapy: Secondary | ICD-10-CM | POA: Diagnosis not present

## 2022-04-17 DIAGNOSIS — Z96643 Presence of artificial hip joint, bilateral: Secondary | ICD-10-CM | POA: Diagnosis present

## 2022-04-17 DIAGNOSIS — S32038A Other fracture of third lumbar vertebra, initial encounter for closed fracture: Secondary | ICD-10-CM | POA: Diagnosis present

## 2022-04-17 DIAGNOSIS — Z743 Need for continuous supervision: Secondary | ICD-10-CM | POA: Diagnosis not present

## 2022-04-17 DIAGNOSIS — Z7409 Other reduced mobility: Secondary | ICD-10-CM | POA: Diagnosis not present

## 2022-04-17 DIAGNOSIS — R55 Syncope and collapse: Secondary | ICD-10-CM | POA: Diagnosis not present

## 2022-04-17 DIAGNOSIS — R262 Difficulty in walking, not elsewhere classified: Secondary | ICD-10-CM | POA: Diagnosis not present

## 2022-04-17 DIAGNOSIS — R4182 Altered mental status, unspecified: Secondary | ICD-10-CM | POA: Diagnosis not present

## 2022-04-17 DIAGNOSIS — M6281 Muscle weakness (generalized): Secondary | ICD-10-CM | POA: Diagnosis not present

## 2022-04-17 DIAGNOSIS — G3183 Dementia with Lewy bodies: Secondary | ICD-10-CM | POA: Diagnosis present

## 2022-04-17 DIAGNOSIS — I480 Paroxysmal atrial fibrillation: Secondary | ICD-10-CM | POA: Diagnosis present

## 2022-04-17 DIAGNOSIS — R41841 Cognitive communication deficit: Secondary | ICD-10-CM | POA: Diagnosis not present

## 2022-04-17 DIAGNOSIS — G4700 Insomnia, unspecified: Secondary | ICD-10-CM | POA: Diagnosis not present

## 2022-04-17 DIAGNOSIS — F02818 Dementia in other diseases classified elsewhere, unspecified severity, with other behavioral disturbance: Secondary | ICD-10-CM | POA: Diagnosis not present

## 2022-04-17 DIAGNOSIS — I2510 Atherosclerotic heart disease of native coronary artery without angina pectoris: Secondary | ICD-10-CM | POA: Diagnosis present

## 2022-04-17 DIAGNOSIS — S32039A Unspecified fracture of third lumbar vertebra, initial encounter for closed fracture: Secondary | ICD-10-CM | POA: Diagnosis not present

## 2022-04-17 DIAGNOSIS — Z9181 History of falling: Secondary | ICD-10-CM | POA: Diagnosis not present

## 2022-04-17 DIAGNOSIS — S32030A Wedge compression fracture of third lumbar vertebra, initial encounter for closed fracture: Secondary | ICD-10-CM | POA: Diagnosis not present

## 2022-04-17 DIAGNOSIS — E119 Type 2 diabetes mellitus without complications: Secondary | ICD-10-CM | POA: Diagnosis present

## 2022-04-17 DIAGNOSIS — M549 Dorsalgia, unspecified: Secondary | ICD-10-CM | POA: Diagnosis not present

## 2022-04-17 DIAGNOSIS — I4891 Unspecified atrial fibrillation: Secondary | ICD-10-CM | POA: Diagnosis not present

## 2022-04-17 DIAGNOSIS — R509 Fever, unspecified: Secondary | ICD-10-CM | POA: Diagnosis not present

## 2022-04-17 DIAGNOSIS — R531 Weakness: Secondary | ICD-10-CM | POA: Diagnosis not present

## 2022-04-17 DIAGNOSIS — M6259 Muscle wasting and atrophy, not elsewhere classified, multiple sites: Secondary | ICD-10-CM | POA: Diagnosis not present

## 2022-04-17 DIAGNOSIS — Z7901 Long term (current) use of anticoagulants: Secondary | ICD-10-CM | POA: Diagnosis not present

## 2022-04-17 DIAGNOSIS — Z741 Need for assistance with personal care: Secondary | ICD-10-CM | POA: Diagnosis not present

## 2022-04-17 DIAGNOSIS — W19XXXA Unspecified fall, initial encounter: Secondary | ICD-10-CM | POA: Diagnosis not present

## 2022-04-17 DIAGNOSIS — F02811 Dementia in other diseases classified elsewhere, unspecified severity, with agitation: Secondary | ICD-10-CM | POA: Diagnosis not present

## 2022-04-18 DIAGNOSIS — E119 Type 2 diabetes mellitus without complications: Secondary | ICD-10-CM | POA: Diagnosis present

## 2022-04-18 DIAGNOSIS — Z7409 Other reduced mobility: Secondary | ICD-10-CM | POA: Diagnosis not present

## 2022-04-18 DIAGNOSIS — Z79899 Other long term (current) drug therapy: Secondary | ICD-10-CM | POA: Diagnosis not present

## 2022-04-18 DIAGNOSIS — Z741 Need for assistance with personal care: Secondary | ICD-10-CM | POA: Diagnosis not present

## 2022-04-18 DIAGNOSIS — R1310 Dysphagia, unspecified: Secondary | ICD-10-CM | POA: Diagnosis not present

## 2022-04-18 DIAGNOSIS — R279 Unspecified lack of coordination: Secondary | ICD-10-CM | POA: Diagnosis not present

## 2022-04-18 DIAGNOSIS — R41841 Cognitive communication deficit: Secondary | ICD-10-CM | POA: Diagnosis not present

## 2022-04-18 DIAGNOSIS — S32039A Unspecified fracture of third lumbar vertebra, initial encounter for closed fracture: Secondary | ICD-10-CM | POA: Diagnosis not present

## 2022-04-18 DIAGNOSIS — M6259 Muscle wasting and atrophy, not elsewhere classified, multiple sites: Secondary | ICD-10-CM | POA: Diagnosis not present

## 2022-04-18 DIAGNOSIS — Z9181 History of falling: Secondary | ICD-10-CM | POA: Diagnosis not present

## 2022-04-18 DIAGNOSIS — G4700 Insomnia, unspecified: Secondary | ICD-10-CM | POA: Diagnosis not present

## 2022-04-18 DIAGNOSIS — Z7901 Long term (current) use of anticoagulants: Secondary | ICD-10-CM | POA: Diagnosis not present

## 2022-04-18 DIAGNOSIS — G3183 Dementia with Lewy bodies: Secondary | ICD-10-CM | POA: Diagnosis present

## 2022-04-18 DIAGNOSIS — S32030A Wedge compression fracture of third lumbar vertebra, initial encounter for closed fracture: Secondary | ICD-10-CM | POA: Diagnosis not present

## 2022-04-18 DIAGNOSIS — Z96643 Presence of artificial hip joint, bilateral: Secondary | ICD-10-CM | POA: Diagnosis present

## 2022-04-18 DIAGNOSIS — R4182 Altered mental status, unspecified: Secondary | ICD-10-CM | POA: Diagnosis not present

## 2022-04-18 DIAGNOSIS — F05 Delirium due to known physiological condition: Secondary | ICD-10-CM | POA: Diagnosis not present

## 2022-04-18 DIAGNOSIS — F02811 Dementia in other diseases classified elsewhere, unspecified severity, with agitation: Secondary | ICD-10-CM | POA: Diagnosis not present

## 2022-04-18 DIAGNOSIS — R638 Other symptoms and signs concerning food and fluid intake: Secondary | ICD-10-CM | POA: Diagnosis not present

## 2022-04-18 DIAGNOSIS — W19XXXA Unspecified fall, initial encounter: Secondary | ICD-10-CM | POA: Diagnosis not present

## 2022-04-18 DIAGNOSIS — N400 Enlarged prostate without lower urinary tract symptoms: Secondary | ICD-10-CM | POA: Diagnosis not present

## 2022-04-18 DIAGNOSIS — R627 Adult failure to thrive: Secondary | ICD-10-CM | POA: Diagnosis present

## 2022-04-18 DIAGNOSIS — E785 Hyperlipidemia, unspecified: Secondary | ICD-10-CM | POA: Diagnosis present

## 2022-04-18 DIAGNOSIS — J690 Pneumonitis due to inhalation of food and vomit: Secondary | ICD-10-CM | POA: Diagnosis not present

## 2022-04-18 DIAGNOSIS — R262 Difficulty in walking, not elsewhere classified: Secondary | ICD-10-CM | POA: Diagnosis not present

## 2022-04-18 DIAGNOSIS — I2510 Atherosclerotic heart disease of native coronary artery without angina pectoris: Secondary | ICD-10-CM | POA: Diagnosis present

## 2022-04-18 DIAGNOSIS — I10 Essential (primary) hypertension: Secondary | ICD-10-CM | POA: Diagnosis present

## 2022-04-18 DIAGNOSIS — F02818 Dementia in other diseases classified elsewhere, unspecified severity, with other behavioral disturbance: Secondary | ICD-10-CM | POA: Diagnosis not present

## 2022-04-18 DIAGNOSIS — F0280 Dementia in other diseases classified elsewhere without behavioral disturbance: Secondary | ICD-10-CM | POA: Diagnosis present

## 2022-04-18 DIAGNOSIS — M6281 Muscle weakness (generalized): Secondary | ICD-10-CM | POA: Diagnosis not present

## 2022-04-18 DIAGNOSIS — Z515 Encounter for palliative care: Secondary | ICD-10-CM | POA: Diagnosis not present

## 2022-04-18 DIAGNOSIS — Z66 Do not resuscitate: Secondary | ICD-10-CM | POA: Diagnosis present

## 2022-04-18 DIAGNOSIS — Z8249 Family history of ischemic heart disease and other diseases of the circulatory system: Secondary | ICD-10-CM | POA: Diagnosis not present

## 2022-04-18 DIAGNOSIS — I480 Paroxysmal atrial fibrillation: Secondary | ICD-10-CM | POA: Diagnosis present

## 2022-04-18 DIAGNOSIS — I252 Old myocardial infarction: Secondary | ICD-10-CM | POA: Diagnosis not present

## 2022-04-18 DIAGNOSIS — R509 Fever, unspecified: Secondary | ICD-10-CM | POA: Diagnosis not present

## 2022-04-18 DIAGNOSIS — Z743 Need for continuous supervision: Secondary | ICD-10-CM | POA: Diagnosis not present

## 2022-04-18 DIAGNOSIS — R1312 Dysphagia, oropharyngeal phase: Secondary | ICD-10-CM | POA: Diagnosis not present

## 2022-04-18 DIAGNOSIS — R2681 Unsteadiness on feet: Secondary | ICD-10-CM | POA: Diagnosis not present

## 2022-04-18 DIAGNOSIS — R531 Weakness: Secondary | ICD-10-CM | POA: Diagnosis not present

## 2022-04-18 DIAGNOSIS — R55 Syncope and collapse: Secondary | ICD-10-CM | POA: Diagnosis not present

## 2022-04-18 DIAGNOSIS — Z833 Family history of diabetes mellitus: Secondary | ICD-10-CM | POA: Diagnosis not present

## 2022-04-18 DIAGNOSIS — S32029A Unspecified fracture of second lumbar vertebra, initial encounter for closed fracture: Secondary | ICD-10-CM | POA: Diagnosis not present

## 2022-04-18 DIAGNOSIS — S32038A Other fracture of third lumbar vertebra, initial encounter for closed fracture: Secondary | ICD-10-CM | POA: Diagnosis present

## 2022-04-19 DIAGNOSIS — Z79899 Other long term (current) drug therapy: Secondary | ICD-10-CM | POA: Diagnosis not present

## 2022-04-19 DIAGNOSIS — R531 Weakness: Secondary | ICD-10-CM | POA: Diagnosis not present

## 2022-04-19 DIAGNOSIS — E785 Hyperlipidemia, unspecified: Secondary | ICD-10-CM | POA: Diagnosis not present

## 2022-04-19 DIAGNOSIS — F0280 Dementia in other diseases classified elsewhere without behavioral disturbance: Secondary | ICD-10-CM | POA: Diagnosis not present

## 2022-04-19 DIAGNOSIS — I10 Essential (primary) hypertension: Secondary | ICD-10-CM | POA: Diagnosis not present

## 2022-04-19 DIAGNOSIS — Z7901 Long term (current) use of anticoagulants: Secondary | ICD-10-CM | POA: Diagnosis not present

## 2022-04-19 DIAGNOSIS — G4700 Insomnia, unspecified: Secondary | ICD-10-CM | POA: Diagnosis not present

## 2022-04-19 DIAGNOSIS — N400 Enlarged prostate without lower urinary tract symptoms: Secondary | ICD-10-CM | POA: Diagnosis not present

## 2022-04-19 DIAGNOSIS — G3183 Dementia with Lewy bodies: Secondary | ICD-10-CM | POA: Diagnosis not present

## 2022-04-19 DIAGNOSIS — I480 Paroxysmal atrial fibrillation: Secondary | ICD-10-CM | POA: Diagnosis not present

## 2022-04-19 DIAGNOSIS — F02818 Dementia in other diseases classified elsewhere, unspecified severity, with other behavioral disturbance: Secondary | ICD-10-CM | POA: Diagnosis not present

## 2022-04-20 DIAGNOSIS — E785 Hyperlipidemia, unspecified: Secondary | ICD-10-CM | POA: Diagnosis not present

## 2022-04-20 DIAGNOSIS — R531 Weakness: Secondary | ICD-10-CM | POA: Diagnosis not present

## 2022-04-20 DIAGNOSIS — G4700 Insomnia, unspecified: Secondary | ICD-10-CM | POA: Diagnosis not present

## 2022-04-20 DIAGNOSIS — N400 Enlarged prostate without lower urinary tract symptoms: Secondary | ICD-10-CM | POA: Diagnosis not present

## 2022-04-20 DIAGNOSIS — F0280 Dementia in other diseases classified elsewhere without behavioral disturbance: Secondary | ICD-10-CM | POA: Diagnosis not present

## 2022-04-20 DIAGNOSIS — Z79899 Other long term (current) drug therapy: Secondary | ICD-10-CM | POA: Diagnosis not present

## 2022-04-20 DIAGNOSIS — Z7901 Long term (current) use of anticoagulants: Secondary | ICD-10-CM | POA: Diagnosis not present

## 2022-04-20 DIAGNOSIS — I10 Essential (primary) hypertension: Secondary | ICD-10-CM | POA: Diagnosis not present

## 2022-04-20 DIAGNOSIS — F02818 Dementia in other diseases classified elsewhere, unspecified severity, with other behavioral disturbance: Secondary | ICD-10-CM | POA: Diagnosis not present

## 2022-04-20 DIAGNOSIS — I480 Paroxysmal atrial fibrillation: Secondary | ICD-10-CM | POA: Diagnosis not present

## 2022-04-20 DIAGNOSIS — G3183 Dementia with Lewy bodies: Secondary | ICD-10-CM | POA: Diagnosis not present

## 2022-04-21 DIAGNOSIS — R509 Fever, unspecified: Secondary | ICD-10-CM | POA: Diagnosis not present

## 2022-04-21 DIAGNOSIS — G3183 Dementia with Lewy bodies: Secondary | ICD-10-CM | POA: Diagnosis not present

## 2022-04-21 DIAGNOSIS — J690 Pneumonitis due to inhalation of food and vomit: Secondary | ICD-10-CM | POA: Diagnosis not present

## 2022-04-21 DIAGNOSIS — F02818 Dementia in other diseases classified elsewhere, unspecified severity, with other behavioral disturbance: Secondary | ICD-10-CM | POA: Diagnosis not present

## 2022-04-21 DIAGNOSIS — E785 Hyperlipidemia, unspecified: Secondary | ICD-10-CM | POA: Diagnosis not present

## 2022-04-21 DIAGNOSIS — F02811 Dementia in other diseases classified elsewhere, unspecified severity, with agitation: Secondary | ICD-10-CM | POA: Diagnosis not present

## 2022-04-21 DIAGNOSIS — N400 Enlarged prostate without lower urinary tract symptoms: Secondary | ICD-10-CM | POA: Diagnosis not present

## 2022-04-21 DIAGNOSIS — F05 Delirium due to known physiological condition: Secondary | ICD-10-CM | POA: Diagnosis not present

## 2022-04-21 DIAGNOSIS — Z7901 Long term (current) use of anticoagulants: Secondary | ICD-10-CM | POA: Diagnosis not present

## 2022-04-21 DIAGNOSIS — R531 Weakness: Secondary | ICD-10-CM | POA: Diagnosis not present

## 2022-04-21 DIAGNOSIS — I10 Essential (primary) hypertension: Secondary | ICD-10-CM | POA: Diagnosis not present

## 2022-04-21 DIAGNOSIS — I480 Paroxysmal atrial fibrillation: Secondary | ICD-10-CM | POA: Diagnosis not present

## 2022-04-22 DIAGNOSIS — G3183 Dementia with Lewy bodies: Secondary | ICD-10-CM | POA: Diagnosis not present

## 2022-04-22 DIAGNOSIS — N400 Enlarged prostate without lower urinary tract symptoms: Secondary | ICD-10-CM | POA: Diagnosis not present

## 2022-04-22 DIAGNOSIS — F05 Delirium due to known physiological condition: Secondary | ICD-10-CM | POA: Diagnosis not present

## 2022-04-22 DIAGNOSIS — I10 Essential (primary) hypertension: Secondary | ICD-10-CM | POA: Diagnosis not present

## 2022-04-22 DIAGNOSIS — F02818 Dementia in other diseases classified elsewhere, unspecified severity, with other behavioral disturbance: Secondary | ICD-10-CM | POA: Diagnosis not present

## 2022-04-22 DIAGNOSIS — J690 Pneumonitis due to inhalation of food and vomit: Secondary | ICD-10-CM | POA: Diagnosis not present

## 2022-04-22 DIAGNOSIS — R531 Weakness: Secondary | ICD-10-CM | POA: Diagnosis not present

## 2022-04-22 DIAGNOSIS — Z7901 Long term (current) use of anticoagulants: Secondary | ICD-10-CM | POA: Diagnosis not present

## 2022-04-22 DIAGNOSIS — G4700 Insomnia, unspecified: Secondary | ICD-10-CM | POA: Diagnosis not present

## 2022-04-22 DIAGNOSIS — I480 Paroxysmal atrial fibrillation: Secondary | ICD-10-CM | POA: Diagnosis not present

## 2022-04-22 DIAGNOSIS — R1310 Dysphagia, unspecified: Secondary | ICD-10-CM | POA: Diagnosis not present

## 2022-04-22 DIAGNOSIS — F02811 Dementia in other diseases classified elsewhere, unspecified severity, with agitation: Secondary | ICD-10-CM | POA: Diagnosis not present

## 2022-04-22 DIAGNOSIS — E785 Hyperlipidemia, unspecified: Secondary | ICD-10-CM | POA: Diagnosis not present

## 2022-04-26 DIAGNOSIS — M79676 Pain in unspecified toe(s): Secondary | ICD-10-CM | POA: Diagnosis not present

## 2022-04-26 DIAGNOSIS — R41841 Cognitive communication deficit: Secondary | ICD-10-CM | POA: Diagnosis not present

## 2022-04-26 DIAGNOSIS — D638 Anemia in other chronic diseases classified elsewhere: Secondary | ICD-10-CM | POA: Diagnosis not present

## 2022-04-26 DIAGNOSIS — R5381 Other malaise: Secondary | ICD-10-CM | POA: Diagnosis not present

## 2022-04-26 DIAGNOSIS — G3183 Dementia with Lewy bodies: Secondary | ICD-10-CM | POA: Diagnosis not present

## 2022-04-26 DIAGNOSIS — I10 Essential (primary) hypertension: Secondary | ICD-10-CM | POA: Diagnosis not present

## 2022-04-26 DIAGNOSIS — M109 Gout, unspecified: Secondary | ICD-10-CM | POA: Diagnosis not present

## 2022-04-26 DIAGNOSIS — Z09 Encounter for follow-up examination after completed treatment for conditions other than malignant neoplasm: Secondary | ICD-10-CM | POA: Diagnosis not present

## 2022-04-26 DIAGNOSIS — R748 Abnormal levels of other serum enzymes: Secondary | ICD-10-CM | POA: Diagnosis not present

## 2022-04-26 DIAGNOSIS — Z9181 History of falling: Secondary | ICD-10-CM | POA: Diagnosis not present

## 2022-04-26 DIAGNOSIS — M6250 Muscle wasting and atrophy, not elsewhere classified, unspecified site: Secondary | ICD-10-CM | POA: Diagnosis not present

## 2022-04-26 DIAGNOSIS — M6281 Muscle weakness (generalized): Secondary | ICD-10-CM | POA: Diagnosis not present

## 2022-04-26 DIAGNOSIS — D689 Coagulation defect, unspecified: Secondary | ICD-10-CM | POA: Diagnosis not present

## 2022-04-26 DIAGNOSIS — S32030D Wedge compression fracture of third lumbar vertebra, subsequent encounter for fracture with routine healing: Secondary | ICD-10-CM | POA: Diagnosis not present

## 2022-04-26 DIAGNOSIS — R627 Adult failure to thrive: Secondary | ICD-10-CM | POA: Diagnosis not present

## 2022-04-26 DIAGNOSIS — R279 Unspecified lack of coordination: Secondary | ICD-10-CM | POA: Diagnosis not present

## 2022-04-26 DIAGNOSIS — F0390 Unspecified dementia without behavioral disturbance: Secondary | ICD-10-CM | POA: Diagnosis not present

## 2022-04-26 DIAGNOSIS — S32039A Unspecified fracture of third lumbar vertebra, initial encounter for closed fracture: Secondary | ICD-10-CM | POA: Diagnosis not present

## 2022-04-26 DIAGNOSIS — R059 Cough, unspecified: Secondary | ICD-10-CM | POA: Diagnosis not present

## 2022-04-26 DIAGNOSIS — F02818 Dementia in other diseases classified elsewhere, unspecified severity, with other behavioral disturbance: Secondary | ICD-10-CM | POA: Diagnosis not present

## 2022-04-26 DIAGNOSIS — R2681 Unsteadiness on feet: Secondary | ICD-10-CM | POA: Diagnosis not present

## 2022-04-26 DIAGNOSIS — I480 Paroxysmal atrial fibrillation: Secondary | ICD-10-CM | POA: Diagnosis not present

## 2022-04-26 DIAGNOSIS — Z743 Need for continuous supervision: Secondary | ICD-10-CM | POA: Diagnosis not present

## 2022-04-26 DIAGNOSIS — R609 Edema, unspecified: Secondary | ICD-10-CM | POA: Diagnosis not present

## 2022-04-26 DIAGNOSIS — G4700 Insomnia, unspecified: Secondary | ICD-10-CM | POA: Diagnosis not present

## 2022-04-26 DIAGNOSIS — Z8679 Personal history of other diseases of the circulatory system: Secondary | ICD-10-CM | POA: Diagnosis not present

## 2022-04-26 DIAGNOSIS — S32030A Wedge compression fracture of third lumbar vertebra, initial encounter for closed fracture: Secondary | ICD-10-CM | POA: Diagnosis not present

## 2022-04-26 DIAGNOSIS — Z6823 Body mass index (BMI) 23.0-23.9, adult: Secondary | ICD-10-CM | POA: Diagnosis not present

## 2022-04-26 DIAGNOSIS — Z741 Need for assistance with personal care: Secondary | ICD-10-CM | POA: Diagnosis not present

## 2022-04-26 DIAGNOSIS — E119 Type 2 diabetes mellitus without complications: Secondary | ICD-10-CM | POA: Diagnosis not present

## 2022-04-26 DIAGNOSIS — W19XXXA Unspecified fall, initial encounter: Secondary | ICD-10-CM | POA: Diagnosis not present

## 2022-04-26 DIAGNOSIS — Z7901 Long term (current) use of anticoagulants: Secondary | ICD-10-CM | POA: Diagnosis not present

## 2022-04-26 DIAGNOSIS — M6259 Muscle wasting and atrophy, not elsewhere classified, multiple sites: Secondary | ICD-10-CM | POA: Diagnosis not present

## 2022-04-26 DIAGNOSIS — S32039D Unspecified fracture of third lumbar vertebra, subsequent encounter for fracture with routine healing: Secondary | ICD-10-CM | POA: Diagnosis not present

## 2022-04-26 DIAGNOSIS — R531 Weakness: Secondary | ICD-10-CM | POA: Diagnosis not present

## 2022-04-26 DIAGNOSIS — R1310 Dysphagia, unspecified: Secondary | ICD-10-CM | POA: Diagnosis not present

## 2022-04-26 DIAGNOSIS — R262 Difficulty in walking, not elsewhere classified: Secondary | ICD-10-CM | POA: Diagnosis not present

## 2022-04-26 DIAGNOSIS — F0280 Dementia in other diseases classified elsewhere without behavioral disturbance: Secondary | ICD-10-CM | POA: Diagnosis not present

## 2022-04-26 DIAGNOSIS — I2510 Atherosclerotic heart disease of native coronary artery without angina pectoris: Secondary | ICD-10-CM | POA: Diagnosis not present

## 2022-04-26 DIAGNOSIS — B351 Tinea unguium: Secondary | ICD-10-CM | POA: Diagnosis not present

## 2022-04-26 DIAGNOSIS — E1159 Type 2 diabetes mellitus with other circulatory complications: Secondary | ICD-10-CM | POA: Diagnosis not present

## 2022-04-26 DIAGNOSIS — I152 Hypertension secondary to endocrine disorders: Secondary | ICD-10-CM | POA: Diagnosis not present

## 2022-04-26 DIAGNOSIS — R1312 Dysphagia, oropharyngeal phase: Secondary | ICD-10-CM | POA: Diagnosis not present

## 2022-04-26 DIAGNOSIS — B372 Candidiasis of skin and nail: Secondary | ICD-10-CM | POA: Diagnosis not present

## 2022-04-26 DIAGNOSIS — E46 Unspecified protein-calorie malnutrition: Secondary | ICD-10-CM | POA: Diagnosis not present

## 2022-04-30 DIAGNOSIS — R1310 Dysphagia, unspecified: Secondary | ICD-10-CM | POA: Diagnosis not present

## 2022-04-30 DIAGNOSIS — M109 Gout, unspecified: Secondary | ICD-10-CM | POA: Diagnosis not present

## 2022-04-30 DIAGNOSIS — I480 Paroxysmal atrial fibrillation: Secondary | ICD-10-CM | POA: Diagnosis not present

## 2022-04-30 DIAGNOSIS — S32030A Wedge compression fracture of third lumbar vertebra, initial encounter for closed fracture: Secondary | ICD-10-CM | POA: Diagnosis not present

## 2022-04-30 DIAGNOSIS — F0280 Dementia in other diseases classified elsewhere without behavioral disturbance: Secondary | ICD-10-CM | POA: Diagnosis not present

## 2022-04-30 DIAGNOSIS — E1159 Type 2 diabetes mellitus with other circulatory complications: Secondary | ICD-10-CM | POA: Diagnosis not present

## 2022-04-30 DIAGNOSIS — G3183 Dementia with Lewy bodies: Secondary | ICD-10-CM | POA: Diagnosis not present

## 2022-04-30 DIAGNOSIS — I152 Hypertension secondary to endocrine disorders: Secondary | ICD-10-CM | POA: Diagnosis not present

## 2022-04-30 DIAGNOSIS — R609 Edema, unspecified: Secondary | ICD-10-CM | POA: Diagnosis not present

## 2022-04-30 DIAGNOSIS — R627 Adult failure to thrive: Secondary | ICD-10-CM | POA: Diagnosis not present

## 2022-04-30 DIAGNOSIS — R5381 Other malaise: Secondary | ICD-10-CM | POA: Diagnosis not present

## 2022-05-02 ENCOUNTER — Other Ambulatory Visit: Payer: Self-pay | Admitting: *Deleted

## 2022-05-02 NOTE — Patient Outreach (Signed)
Per [place] eligible member currently resides in [facility].  Screening for potential [facility] care coordination/care management services as a benefit of Mr. [place] insurance plan and PCP.  [facility] with [facility] has [facility] care management services.   Mr. Creque admitted to SNF on 04/26/22 after hospitalization.  Facility site visit to [place] [facility] skilled nursing facility. Met with [facility] SNF social workers, and Verdis Frederickson, Marketing executive. Mr. Minton is from home with spouse. Currently decreased cognition is a barrier with therapy. Currently standing frame with 2 person assist. Mrs. [place] is primary decision maker. Has 2 sons as well. The goal is to return home. Will likely need 24/7 caregiver assist.  Will continue to follow.    Marthenia Rolling, MSN, RN,BSN [place] Acute Care Coordinator [phone] [facility]) [phone]  (Toll free office)

## 2022-05-06 DIAGNOSIS — R627 Adult failure to thrive: Secondary | ICD-10-CM | POA: Diagnosis not present

## 2022-05-06 DIAGNOSIS — B372 Candidiasis of skin and nail: Secondary | ICD-10-CM | POA: Diagnosis not present

## 2022-05-06 DIAGNOSIS — Z8679 Personal history of other diseases of the circulatory system: Secondary | ICD-10-CM | POA: Diagnosis not present

## 2022-05-06 DIAGNOSIS — S32030D Wedge compression fracture of third lumbar vertebra, subsequent encounter for fracture with routine healing: Secondary | ICD-10-CM | POA: Diagnosis not present

## 2022-05-06 DIAGNOSIS — Z09 Encounter for follow-up examination after completed treatment for conditions other than malignant neoplasm: Secondary | ICD-10-CM | POA: Diagnosis not present

## 2022-05-06 DIAGNOSIS — E1159 Type 2 diabetes mellitus with other circulatory complications: Secondary | ICD-10-CM | POA: Diagnosis not present

## 2022-05-06 DIAGNOSIS — I152 Hypertension secondary to endocrine disorders: Secondary | ICD-10-CM | POA: Diagnosis not present

## 2022-05-09 ENCOUNTER — Other Ambulatory Visit: Payer: Self-pay | Admitting: *Deleted

## 2022-05-09 NOTE — Patient Outreach (Signed)
[facility] [place]- Acute Care Coordinator follow up. Per [place] eligible member currently resides in [facility].  Screening for potential care coordination/care management services as a benefit for Mr. [place] insurance plan and PCP with [facility] at [place].   Mr. Osuna PCP at [facility] at [facility] has Upstream care management services available.   Facility site visit to [facility]. Met with Irine Seal and [place], SNF social workers. Irine Seal reports Mr. Cud[place] is progressing with therapy. He was previously dependent with bed mobility. Now he is max assist. He was previously stand frame x2. Now he is ambulating 20 feet with rolling walker.  Anticipated transition plan is to return home with spouse.    Will continue to follow while Mr. Jacob resides in SNF.   Marthenia Rolling, MSN, RN,BSN [place] Acute Care Coordinator [phone] [facility]) [phone]  (Toll free office)

## 2022-05-17 ENCOUNTER — Other Ambulatory Visit: Payer: Self-pay

## 2022-05-17 DIAGNOSIS — I2510 Atherosclerotic heart disease of native coronary artery without angina pectoris: Secondary | ICD-10-CM

## 2022-05-17 DIAGNOSIS — I10 Essential (primary) hypertension: Secondary | ICD-10-CM

## 2022-05-17 MED ORDER — APIXABAN 5 MG PO TABS: ORAL_TABLET | ORAL | 5 refills | Status: AC

## 2022-05-17 NOTE — Telephone Encounter (Signed)
Prescription refill request for Eliquis received. Indication:Afib Last office visit:4/23 Scr:0.6 Age: 88 Weight:73.4 kg  Prescription refilled

## 2022-05-23 ENCOUNTER — Other Ambulatory Visit: Payer: Self-pay | Admitting: *Deleted

## 2022-05-23 DIAGNOSIS — M6250 Muscle wasting and atrophy, not elsewhere classified, unspecified site: Secondary | ICD-10-CM | POA: Diagnosis not present

## 2022-05-23 DIAGNOSIS — R5381 Other malaise: Secondary | ICD-10-CM | POA: Diagnosis not present

## 2022-05-23 DIAGNOSIS — Z9181 History of falling: Secondary | ICD-10-CM | POA: Diagnosis not present

## 2022-05-23 DIAGNOSIS — M6281 Muscle weakness (generalized): Secondary | ICD-10-CM | POA: Diagnosis not present

## 2022-05-23 DIAGNOSIS — S32039D Unspecified fracture of third lumbar vertebra, subsequent encounter for fracture with routine healing: Secondary | ICD-10-CM | POA: Diagnosis not present

## 2022-05-23 DIAGNOSIS — F0390 Unspecified dementia without behavioral disturbance: Secondary | ICD-10-CM | POA: Diagnosis not present

## 2022-05-23 DIAGNOSIS — R262 Difficulty in walking, not elsewhere classified: Secondary | ICD-10-CM | POA: Diagnosis not present

## 2022-05-23 DIAGNOSIS — S32039A Unspecified fracture of third lumbar vertebra, initial encounter for closed fracture: Secondary | ICD-10-CM | POA: Diagnosis not present

## 2022-05-23 DIAGNOSIS — G3183 Dementia with Lewy bodies: Secondary | ICD-10-CM | POA: Diagnosis not present

## 2022-05-23 NOTE — Patient Outreach (Signed)
[facility] [place]- Acute Care Coordinator follow up. Jonathan [name] resides in [facility].  Facility site visit to [facility]. Collaboration with SNF social workers [place] [facility] [place] and Verdis Frederickson, Civil engineer, contracting. Family has decided to take Jonathan [name] home with caregiver assistance. Lives with spouse. Will need hospital bed ordered. Has other DME at home. Speech therapy continues to work with Jonathan [name]. To have feeding assessment tomorrow. Currently on puree diet. Will be wheelchair level upon returning home.   Will continue to follow.    Marthenia Rolling, MSN, RN,BSN [place] Acute Care Coordinator [phone] [facility]) [phone]  (Toll free office)

## 2022-05-27 DIAGNOSIS — S32039A Unspecified fracture of third lumbar vertebra, initial encounter for closed fracture: Secondary | ICD-10-CM | POA: Diagnosis not present

## 2022-05-27 DIAGNOSIS — G3183 Dementia with Lewy bodies: Secondary | ICD-10-CM | POA: Diagnosis not present

## 2022-05-27 DIAGNOSIS — R262 Difficulty in walking, not elsewhere classified: Secondary | ICD-10-CM | POA: Diagnosis not present

## 2022-05-27 DIAGNOSIS — Z9181 History of falling: Secondary | ICD-10-CM | POA: Diagnosis not present

## 2022-05-27 DIAGNOSIS — M6281 Muscle weakness (generalized): Secondary | ICD-10-CM | POA: Diagnosis not present

## 2022-05-27 DIAGNOSIS — R5381 Other malaise: Secondary | ICD-10-CM | POA: Diagnosis not present

## 2022-05-27 DIAGNOSIS — S32039D Unspecified fracture of third lumbar vertebra, subsequent encounter for fracture with routine healing: Secondary | ICD-10-CM | POA: Diagnosis not present

## 2022-05-27 DIAGNOSIS — F0390 Unspecified dementia without behavioral disturbance: Secondary | ICD-10-CM | POA: Diagnosis not present

## 2022-05-27 DIAGNOSIS — M6250 Muscle wasting and atrophy, not elsewhere classified, unspecified site: Secondary | ICD-10-CM | POA: Diagnosis not present

## 2022-05-30 ENCOUNTER — Other Ambulatory Visit: Payer: Self-pay | Admitting: *Deleted

## 2022-05-30 NOTE — Patient Outreach (Signed)
[place] Coordinator follow up.  Mr. Kucinski currently resides in [facility].   Collaboration with Verdis Frederickson, therapy Freight forwarder, Irine Seal and [place], SNF social workers. Mr. [place] has progressed greatly. Will return home with spouse. Will have 24/7 caregiver assistance.  Discharge date to be set soon.   Will continue to follow.   Marthenia Rolling, MSN, RN,BSN [place] Acute Care Coordinator [phone] [facility]) [phone]  (Toll free office)

## 2022-05-31 DIAGNOSIS — S32039A Unspecified fracture of third lumbar vertebra, initial encounter for closed fracture: Secondary | ICD-10-CM | POA: Diagnosis not present

## 2022-05-31 DIAGNOSIS — M6281 Muscle weakness (generalized): Secondary | ICD-10-CM | POA: Diagnosis not present

## 2022-05-31 DIAGNOSIS — Z9181 History of falling: Secondary | ICD-10-CM | POA: Diagnosis not present

## 2022-05-31 DIAGNOSIS — M6250 Muscle wasting and atrophy, not elsewhere classified, unspecified site: Secondary | ICD-10-CM | POA: Diagnosis not present

## 2022-05-31 DIAGNOSIS — R5381 Other malaise: Secondary | ICD-10-CM | POA: Diagnosis not present

## 2022-05-31 DIAGNOSIS — R262 Difficulty in walking, not elsewhere classified: Secondary | ICD-10-CM | POA: Diagnosis not present

## 2022-05-31 DIAGNOSIS — G3183 Dementia with Lewy bodies: Secondary | ICD-10-CM | POA: Diagnosis not present

## 2022-05-31 DIAGNOSIS — S32039D Unspecified fracture of third lumbar vertebra, subsequent encounter for fracture with routine healing: Secondary | ICD-10-CM | POA: Diagnosis not present

## 2022-05-31 DIAGNOSIS — F0390 Unspecified dementia without behavioral disturbance: Secondary | ICD-10-CM | POA: Diagnosis not present

## 2022-06-03 DIAGNOSIS — S32030D Wedge compression fracture of third lumbar vertebra, subsequent encounter for fracture with routine healing: Secondary | ICD-10-CM | POA: Diagnosis not present

## 2022-06-04 DIAGNOSIS — S32030D Wedge compression fracture of third lumbar vertebra, subsequent encounter for fracture with routine healing: Secondary | ICD-10-CM | POA: Diagnosis not present

## 2022-06-04 DIAGNOSIS — I152 Hypertension secondary to endocrine disorders: Secondary | ICD-10-CM | POA: Diagnosis not present

## 2022-06-04 DIAGNOSIS — Z6823 Body mass index (BMI) 23.0-23.9, adult: Secondary | ICD-10-CM | POA: Diagnosis not present

## 2022-06-04 DIAGNOSIS — E46 Unspecified protein-calorie malnutrition: Secondary | ICD-10-CM | POA: Diagnosis not present

## 2022-06-04 DIAGNOSIS — R748 Abnormal levels of other serum enzymes: Secondary | ICD-10-CM | POA: Diagnosis not present

## 2022-06-04 DIAGNOSIS — I480 Paroxysmal atrial fibrillation: Secondary | ICD-10-CM | POA: Diagnosis not present

## 2022-06-04 DIAGNOSIS — E119 Type 2 diabetes mellitus without complications: Secondary | ICD-10-CM | POA: Diagnosis not present

## 2022-06-04 DIAGNOSIS — R1310 Dysphagia, unspecified: Secondary | ICD-10-CM | POA: Diagnosis not present

## 2022-06-04 DIAGNOSIS — G3183 Dementia with Lewy bodies: Secondary | ICD-10-CM | POA: Diagnosis not present

## 2022-06-04 DIAGNOSIS — D638 Anemia in other chronic diseases classified elsewhere: Secondary | ICD-10-CM | POA: Diagnosis not present

## 2022-06-04 DIAGNOSIS — Z7901 Long term (current) use of anticoagulants: Secondary | ICD-10-CM | POA: Diagnosis not present

## 2022-06-05 DIAGNOSIS — S32039D Unspecified fracture of third lumbar vertebra, subsequent encounter for fracture with routine healing: Secondary | ICD-10-CM | POA: Diagnosis not present

## 2022-06-05 DIAGNOSIS — G3183 Dementia with Lewy bodies: Secondary | ICD-10-CM | POA: Diagnosis not present

## 2022-06-05 DIAGNOSIS — R5381 Other malaise: Secondary | ICD-10-CM | POA: Diagnosis not present

## 2022-06-05 DIAGNOSIS — Z9181 History of falling: Secondary | ICD-10-CM | POA: Diagnosis not present

## 2022-06-05 DIAGNOSIS — R262 Difficulty in walking, not elsewhere classified: Secondary | ICD-10-CM | POA: Diagnosis not present

## 2022-06-05 DIAGNOSIS — M6250 Muscle wasting and atrophy, not elsewhere classified, unspecified site: Secondary | ICD-10-CM | POA: Diagnosis not present

## 2022-06-05 DIAGNOSIS — F0390 Unspecified dementia without behavioral disturbance: Secondary | ICD-10-CM | POA: Diagnosis not present

## 2022-06-05 DIAGNOSIS — M6281 Muscle weakness (generalized): Secondary | ICD-10-CM | POA: Diagnosis not present

## 2022-06-05 DIAGNOSIS — S32039A Unspecified fracture of third lumbar vertebra, initial encounter for closed fracture: Secondary | ICD-10-CM | POA: Diagnosis not present

## 2022-06-06 DIAGNOSIS — F0390 Unspecified dementia without behavioral disturbance: Secondary | ICD-10-CM | POA: Diagnosis not present

## 2022-06-06 DIAGNOSIS — I152 Hypertension secondary to endocrine disorders: Secondary | ICD-10-CM | POA: Diagnosis not present

## 2022-06-06 DIAGNOSIS — R059 Cough, unspecified: Secondary | ICD-10-CM | POA: Diagnosis not present

## 2022-06-06 DIAGNOSIS — E1159 Type 2 diabetes mellitus with other circulatory complications: Secondary | ICD-10-CM | POA: Diagnosis not present

## 2022-06-07 DIAGNOSIS — S32039D Unspecified fracture of third lumbar vertebra, subsequent encounter for fracture with routine healing: Secondary | ICD-10-CM | POA: Diagnosis not present

## 2022-06-07 DIAGNOSIS — R5381 Other malaise: Secondary | ICD-10-CM | POA: Diagnosis not present

## 2022-06-07 DIAGNOSIS — R262 Difficulty in walking, not elsewhere classified: Secondary | ICD-10-CM | POA: Diagnosis not present

## 2022-06-07 DIAGNOSIS — F0390 Unspecified dementia without behavioral disturbance: Secondary | ICD-10-CM | POA: Diagnosis not present

## 2022-06-07 DIAGNOSIS — Z9181 History of falling: Secondary | ICD-10-CM | POA: Diagnosis not present

## 2022-06-07 DIAGNOSIS — M6281 Muscle weakness (generalized): Secondary | ICD-10-CM | POA: Diagnosis not present

## 2022-06-07 DIAGNOSIS — G3183 Dementia with Lewy bodies: Secondary | ICD-10-CM | POA: Diagnosis not present

## 2022-06-07 DIAGNOSIS — M6250 Muscle wasting and atrophy, not elsewhere classified, unspecified site: Secondary | ICD-10-CM | POA: Diagnosis not present

## 2022-06-07 DIAGNOSIS — S32039A Unspecified fracture of third lumbar vertebra, initial encounter for closed fracture: Secondary | ICD-10-CM | POA: Diagnosis not present

## 2022-06-10 DIAGNOSIS — M6250 Muscle wasting and atrophy, not elsewhere classified, unspecified site: Secondary | ICD-10-CM | POA: Diagnosis not present

## 2022-06-10 DIAGNOSIS — F0390 Unspecified dementia without behavioral disturbance: Secondary | ICD-10-CM | POA: Diagnosis not present

## 2022-06-10 DIAGNOSIS — R5381 Other malaise: Secondary | ICD-10-CM | POA: Diagnosis not present

## 2022-06-10 DIAGNOSIS — Z9181 History of falling: Secondary | ICD-10-CM | POA: Diagnosis not present

## 2022-06-10 DIAGNOSIS — G3183 Dementia with Lewy bodies: Secondary | ICD-10-CM | POA: Diagnosis not present

## 2022-06-10 DIAGNOSIS — S32039A Unspecified fracture of third lumbar vertebra, initial encounter for closed fracture: Secondary | ICD-10-CM | POA: Diagnosis not present

## 2022-06-10 DIAGNOSIS — S32039D Unspecified fracture of third lumbar vertebra, subsequent encounter for fracture with routine healing: Secondary | ICD-10-CM | POA: Diagnosis not present

## 2022-06-10 DIAGNOSIS — R262 Difficulty in walking, not elsewhere classified: Secondary | ICD-10-CM | POA: Diagnosis not present

## 2022-06-10 DIAGNOSIS — M6281 Muscle weakness (generalized): Secondary | ICD-10-CM | POA: Diagnosis not present

## 2022-06-11 ENCOUNTER — Ambulatory Visit (INDEPENDENT_AMBULATORY_CARE_PROVIDER_SITE_OTHER): Payer: Medicare Other | Admitting: Podiatry

## 2022-06-11 ENCOUNTER — Encounter: Payer: Self-pay | Admitting: Podiatry

## 2022-06-11 DIAGNOSIS — B351 Tinea unguium: Secondary | ICD-10-CM

## 2022-06-11 DIAGNOSIS — M79676 Pain in unspecified toe(s): Secondary | ICD-10-CM | POA: Diagnosis not present

## 2022-06-11 DIAGNOSIS — D689 Coagulation defect, unspecified: Secondary | ICD-10-CM | POA: Diagnosis not present

## 2022-06-11 DIAGNOSIS — E119 Type 2 diabetes mellitus without complications: Secondary | ICD-10-CM | POA: Diagnosis not present

## 2022-06-11 NOTE — Progress Notes (Signed)
This patient returns to my office for at risk foot care.  This patient requires this care by a professional since this patient will be at risk due to having coagulation defect and diabetes.  Patient is taking eliquiss.  This patient is unable to cut nails himself since the patient cannot reach his nails.These nails are painful walking and wearing shoes.  This patient presents for at risk foot care today.  Patient presents to the office with his wife.  General Appearance  Alert, conversant and in no acute stress.  Vascular  Dorsalis pedis and posterior tibial  pulses are weakly palpable  bilaterally.  Capillary return is within normal limits  bilaterally. Temperature is within normal limits  bilaterally.  Neurologic  Senn-Weinstein monofilament wire test within normal limits  bilaterally. Muscle power within normal limits bilaterally.  Nails Thick disfigured discolored nails with subungual debris  from hallux to fifth toes bilaterally. No evidence of bacterial infection or drainage bilaterally.  Orthopedic  No limitations of motion  feet .  No crepitus or effusions noted.  No bony pathology or digital deformities noted.  Skin  normotropic skin with no porokeratosis noted bilaterally.  No signs of infections or ulcers noted.     Onychomycosis  Pain in right toes  Pain in left toes  Consent was obtained for treatment procedures.   Mechanical debridement of nails 1-5  bilaterally performed with a nail nipper.  Filed with dremel without incident.    Return office visit     3 months                 Told patient to return for periodic foot care and evaluation due to potential at risk complications.   Gardiner Barefoot DPM

## 2022-06-13 DIAGNOSIS — G3183 Dementia with Lewy bodies: Secondary | ICD-10-CM | POA: Diagnosis not present

## 2022-06-13 DIAGNOSIS — F0390 Unspecified dementia without behavioral disturbance: Secondary | ICD-10-CM | POA: Diagnosis not present

## 2022-06-13 DIAGNOSIS — S32039D Unspecified fracture of third lumbar vertebra, subsequent encounter for fracture with routine healing: Secondary | ICD-10-CM | POA: Diagnosis not present

## 2022-06-13 DIAGNOSIS — M6281 Muscle weakness (generalized): Secondary | ICD-10-CM | POA: Diagnosis not present

## 2022-06-13 DIAGNOSIS — R262 Difficulty in walking, not elsewhere classified: Secondary | ICD-10-CM | POA: Diagnosis not present

## 2022-06-13 DIAGNOSIS — R5381 Other malaise: Secondary | ICD-10-CM | POA: Diagnosis not present

## 2022-06-13 DIAGNOSIS — S32039A Unspecified fracture of third lumbar vertebra, initial encounter for closed fracture: Secondary | ICD-10-CM | POA: Diagnosis not present

## 2022-06-13 DIAGNOSIS — M6250 Muscle wasting and atrophy, not elsewhere classified, unspecified site: Secondary | ICD-10-CM | POA: Diagnosis not present

## 2022-06-13 DIAGNOSIS — Z9181 History of falling: Secondary | ICD-10-CM | POA: Diagnosis not present

## 2022-06-18 DIAGNOSIS — S32039D Unspecified fracture of third lumbar vertebra, subsequent encounter for fracture with routine healing: Secondary | ICD-10-CM | POA: Diagnosis not present

## 2022-06-18 DIAGNOSIS — R5381 Other malaise: Secondary | ICD-10-CM | POA: Diagnosis not present

## 2022-06-18 DIAGNOSIS — R262 Difficulty in walking, not elsewhere classified: Secondary | ICD-10-CM | POA: Diagnosis not present

## 2022-06-18 DIAGNOSIS — G3183 Dementia with Lewy bodies: Secondary | ICD-10-CM | POA: Diagnosis not present

## 2022-06-18 DIAGNOSIS — M6281 Muscle weakness (generalized): Secondary | ICD-10-CM | POA: Diagnosis not present

## 2022-06-18 DIAGNOSIS — Z9181 History of falling: Secondary | ICD-10-CM | POA: Diagnosis not present

## 2022-06-18 DIAGNOSIS — F0390 Unspecified dementia without behavioral disturbance: Secondary | ICD-10-CM | POA: Diagnosis not present

## 2022-06-18 DIAGNOSIS — M6250 Muscle wasting and atrophy, not elsewhere classified, unspecified site: Secondary | ICD-10-CM | POA: Diagnosis not present

## 2022-06-18 DIAGNOSIS — S32039A Unspecified fracture of third lumbar vertebra, initial encounter for closed fracture: Secondary | ICD-10-CM | POA: Diagnosis not present

## 2022-06-19 DIAGNOSIS — F0280 Dementia in other diseases classified elsewhere without behavioral disturbance: Secondary | ICD-10-CM | POA: Diagnosis not present

## 2022-06-19 DIAGNOSIS — S32030D Wedge compression fracture of third lumbar vertebra, subsequent encounter for fracture with routine healing: Secondary | ICD-10-CM | POA: Diagnosis not present

## 2022-06-19 DIAGNOSIS — Z7901 Long term (current) use of anticoagulants: Secondary | ICD-10-CM | POA: Diagnosis not present

## 2022-06-19 DIAGNOSIS — E119 Type 2 diabetes mellitus without complications: Secondary | ICD-10-CM | POA: Diagnosis not present

## 2022-06-19 DIAGNOSIS — I2510 Atherosclerotic heart disease of native coronary artery without angina pectoris: Secondary | ICD-10-CM | POA: Diagnosis not present

## 2022-06-19 DIAGNOSIS — R1310 Dysphagia, unspecified: Secondary | ICD-10-CM | POA: Diagnosis not present

## 2022-06-19 DIAGNOSIS — I480 Paroxysmal atrial fibrillation: Secondary | ICD-10-CM | POA: Diagnosis not present

## 2022-06-19 DIAGNOSIS — I10 Essential (primary) hypertension: Secondary | ICD-10-CM | POA: Diagnosis not present

## 2022-06-19 DIAGNOSIS — G3183 Dementia with Lewy bodies: Secondary | ICD-10-CM | POA: Diagnosis not present

## 2022-06-20 ENCOUNTER — Other Ambulatory Visit: Payer: Self-pay | Admitting: *Deleted

## 2022-06-20 DIAGNOSIS — Z9181 History of falling: Secondary | ICD-10-CM | POA: Diagnosis not present

## 2022-06-20 DIAGNOSIS — S32039D Unspecified fracture of third lumbar vertebra, subsequent encounter for fracture with routine healing: Secondary | ICD-10-CM | POA: Diagnosis not present

## 2022-06-20 DIAGNOSIS — S32039A Unspecified fracture of third lumbar vertebra, initial encounter for closed fracture: Secondary | ICD-10-CM | POA: Diagnosis not present

## 2022-06-20 DIAGNOSIS — F0390 Unspecified dementia without behavioral disturbance: Secondary | ICD-10-CM | POA: Diagnosis not present

## 2022-06-20 DIAGNOSIS — M6281 Muscle weakness (generalized): Secondary | ICD-10-CM | POA: Diagnosis not present

## 2022-06-20 DIAGNOSIS — R262 Difficulty in walking, not elsewhere classified: Secondary | ICD-10-CM | POA: Diagnosis not present

## 2022-06-20 DIAGNOSIS — M6250 Muscle wasting and atrophy, not elsewhere classified, unspecified site: Secondary | ICD-10-CM | POA: Diagnosis not present

## 2022-06-20 DIAGNOSIS — R5381 Other malaise: Secondary | ICD-10-CM | POA: Diagnosis not present

## 2022-06-20 DIAGNOSIS — G3183 Dementia with Lewy bodies: Secondary | ICD-10-CM | POA: Diagnosis not present

## 2022-06-20 NOTE — Patient Outreach (Signed)
[facility] [place]- Acute Care Coordinator follow up.   Facility site visit to [facility]. Mr. Persing discharged today 06/20/22. He will have [place] private duty caregivers 24/7 and [facility] home health.   PC[facility] office [facility] has [facility]stream care management.  Will send notification to Upstream of SNF discharge.    Marthenia Rolling, MSN, RN,BSN [place] Acute Care Coordinator [phone] [facility]) [phone]  (Toll free office)

## 2022-06-21 DIAGNOSIS — K222 Esophageal obstruction: Secondary | ICD-10-CM | POA: Diagnosis not present

## 2022-06-21 DIAGNOSIS — F0280 Dementia in other diseases classified elsewhere without behavioral disturbance: Secondary | ICD-10-CM | POA: Diagnosis not present

## 2022-06-21 DIAGNOSIS — K219 Gastro-esophageal reflux disease without esophagitis: Secondary | ICD-10-CM | POA: Diagnosis not present

## 2022-06-21 DIAGNOSIS — E1159 Type 2 diabetes mellitus with other circulatory complications: Secondary | ICD-10-CM | POA: Diagnosis not present

## 2022-06-21 DIAGNOSIS — I700 Atherosclerosis of aorta: Secondary | ICD-10-CM | POA: Diagnosis not present

## 2022-06-21 DIAGNOSIS — G3183 Dementia with Lewy bodies: Secondary | ICD-10-CM | POA: Diagnosis not present

## 2022-06-21 DIAGNOSIS — Z7901 Long term (current) use of anticoagulants: Secondary | ICD-10-CM | POA: Diagnosis not present

## 2022-06-21 DIAGNOSIS — I480 Paroxysmal atrial fibrillation: Secondary | ICD-10-CM | POA: Diagnosis not present

## 2022-06-21 DIAGNOSIS — N400 Enlarged prostate without lower urinary tract symptoms: Secondary | ICD-10-CM | POA: Diagnosis not present

## 2022-06-21 DIAGNOSIS — I152 Hypertension secondary to endocrine disorders: Secondary | ICD-10-CM | POA: Diagnosis not present

## 2022-06-21 DIAGNOSIS — S32039D Unspecified fracture of third lumbar vertebra, subsequent encounter for fracture with routine healing: Secondary | ICD-10-CM | POA: Diagnosis not present

## 2022-06-21 DIAGNOSIS — M47816 Spondylosis without myelopathy or radiculopathy, lumbar region: Secondary | ICD-10-CM | POA: Diagnosis not present

## 2022-06-21 DIAGNOSIS — N200 Calculus of kidney: Secondary | ICD-10-CM | POA: Diagnosis not present

## 2022-06-21 DIAGNOSIS — I252 Old myocardial infarction: Secondary | ICD-10-CM | POA: Diagnosis not present

## 2022-06-21 DIAGNOSIS — I2510 Atherosclerotic heart disease of native coronary artery without angina pectoris: Secondary | ICD-10-CM | POA: Diagnosis not present

## 2022-06-21 DIAGNOSIS — R1312 Dysphagia, oropharyngeal phase: Secondary | ICD-10-CM | POA: Diagnosis not present

## 2022-06-21 DIAGNOSIS — K8020 Calculus of gallbladder without cholecystitis without obstruction: Secondary | ICD-10-CM | POA: Diagnosis not present

## 2022-06-21 DIAGNOSIS — K5790 Diverticulosis of intestine, part unspecified, without perforation or abscess without bleeding: Secondary | ICD-10-CM | POA: Diagnosis not present

## 2022-06-21 DIAGNOSIS — M109 Gout, unspecified: Secondary | ICD-10-CM | POA: Diagnosis not present

## 2022-06-21 DIAGNOSIS — Z96643 Presence of artificial hip joint, bilateral: Secondary | ICD-10-CM | POA: Diagnosis not present

## 2022-06-21 DIAGNOSIS — R1314 Dysphagia, pharyngoesophageal phase: Secondary | ICD-10-CM | POA: Diagnosis not present

## 2022-06-21 DIAGNOSIS — Z9181 History of falling: Secondary | ICD-10-CM | POA: Diagnosis not present

## 2022-06-21 DIAGNOSIS — Z8781 Personal history of (healed) traumatic fracture: Secondary | ICD-10-CM | POA: Diagnosis not present

## 2022-06-21 DIAGNOSIS — Z961 Presence of intraocular lens: Secondary | ICD-10-CM | POA: Diagnosis not present

## 2022-06-21 DIAGNOSIS — M1990 Unspecified osteoarthritis, unspecified site: Secondary | ICD-10-CM | POA: Diagnosis not present

## 2022-06-26 DIAGNOSIS — I152 Hypertension secondary to endocrine disorders: Secondary | ICD-10-CM | POA: Diagnosis not present

## 2022-06-26 DIAGNOSIS — F0280 Dementia in other diseases classified elsewhere without behavioral disturbance: Secondary | ICD-10-CM | POA: Diagnosis not present

## 2022-06-26 DIAGNOSIS — S32039D Unspecified fracture of third lumbar vertebra, subsequent encounter for fracture with routine healing: Secondary | ICD-10-CM | POA: Diagnosis not present

## 2022-06-26 DIAGNOSIS — G3183 Dementia with Lewy bodies: Secondary | ICD-10-CM | POA: Diagnosis not present

## 2022-06-26 DIAGNOSIS — E1159 Type 2 diabetes mellitus with other circulatory complications: Secondary | ICD-10-CM | POA: Diagnosis not present

## 2022-06-26 DIAGNOSIS — R1312 Dysphagia, oropharyngeal phase: Secondary | ICD-10-CM | POA: Diagnosis not present

## 2022-06-28 DIAGNOSIS — S32039D Unspecified fracture of third lumbar vertebra, subsequent encounter for fracture with routine healing: Secondary | ICD-10-CM | POA: Diagnosis not present

## 2022-06-28 DIAGNOSIS — R1312 Dysphagia, oropharyngeal phase: Secondary | ICD-10-CM | POA: Diagnosis not present

## 2022-06-28 DIAGNOSIS — F0280 Dementia in other diseases classified elsewhere without behavioral disturbance: Secondary | ICD-10-CM | POA: Diagnosis not present

## 2022-06-28 DIAGNOSIS — I152 Hypertension secondary to endocrine disorders: Secondary | ICD-10-CM | POA: Diagnosis not present

## 2022-06-28 DIAGNOSIS — G3183 Dementia with Lewy bodies: Secondary | ICD-10-CM | POA: Diagnosis not present

## 2022-06-28 DIAGNOSIS — E1159 Type 2 diabetes mellitus with other circulatory complications: Secondary | ICD-10-CM | POA: Diagnosis not present

## 2022-06-29 DIAGNOSIS — S32039D Unspecified fracture of third lumbar vertebra, subsequent encounter for fracture with routine healing: Secondary | ICD-10-CM | POA: Diagnosis not present

## 2022-06-29 DIAGNOSIS — F0280 Dementia in other diseases classified elsewhere without behavioral disturbance: Secondary | ICD-10-CM | POA: Diagnosis not present

## 2022-06-29 DIAGNOSIS — G3183 Dementia with Lewy bodies: Secondary | ICD-10-CM | POA: Diagnosis not present

## 2022-06-29 DIAGNOSIS — E1159 Type 2 diabetes mellitus with other circulatory complications: Secondary | ICD-10-CM | POA: Diagnosis not present

## 2022-06-29 DIAGNOSIS — R1312 Dysphagia, oropharyngeal phase: Secondary | ICD-10-CM | POA: Diagnosis not present

## 2022-06-29 DIAGNOSIS — I152 Hypertension secondary to endocrine disorders: Secondary | ICD-10-CM | POA: Diagnosis not present

## 2022-07-01 DIAGNOSIS — S32039D Unspecified fracture of third lumbar vertebra, subsequent encounter for fracture with routine healing: Secondary | ICD-10-CM | POA: Diagnosis not present

## 2022-07-01 DIAGNOSIS — F0280 Dementia in other diseases classified elsewhere without behavioral disturbance: Secondary | ICD-10-CM | POA: Diagnosis not present

## 2022-07-01 DIAGNOSIS — G3183 Dementia with Lewy bodies: Secondary | ICD-10-CM | POA: Diagnosis not present

## 2022-07-01 DIAGNOSIS — I152 Hypertension secondary to endocrine disorders: Secondary | ICD-10-CM | POA: Diagnosis not present

## 2022-07-01 DIAGNOSIS — E1159 Type 2 diabetes mellitus with other circulatory complications: Secondary | ICD-10-CM | POA: Diagnosis not present

## 2022-07-01 DIAGNOSIS — R1312 Dysphagia, oropharyngeal phase: Secondary | ICD-10-CM | POA: Diagnosis not present

## 2022-07-02 DIAGNOSIS — E1159 Type 2 diabetes mellitus with other circulatory complications: Secondary | ICD-10-CM | POA: Diagnosis not present

## 2022-07-02 DIAGNOSIS — F0280 Dementia in other diseases classified elsewhere without behavioral disturbance: Secondary | ICD-10-CM | POA: Diagnosis not present

## 2022-07-02 DIAGNOSIS — R1312 Dysphagia, oropharyngeal phase: Secondary | ICD-10-CM | POA: Diagnosis not present

## 2022-07-02 DIAGNOSIS — S32039D Unspecified fracture of third lumbar vertebra, subsequent encounter for fracture with routine healing: Secondary | ICD-10-CM | POA: Diagnosis not present

## 2022-07-02 DIAGNOSIS — I152 Hypertension secondary to endocrine disorders: Secondary | ICD-10-CM | POA: Diagnosis not present

## 2022-07-02 DIAGNOSIS — G3183 Dementia with Lewy bodies: Secondary | ICD-10-CM | POA: Diagnosis not present
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
## 2022-07-03 ENCOUNTER — Ambulatory Visit: Payer: Medicare Other | Admitting: Family Medicine

## 2022-07-03 ENCOUNTER — Emergency Department (HOSPITAL_COMMUNITY): Payer: Medicare Other

## 2022-07-03 ENCOUNTER — Observation Stay (HOSPITAL_COMMUNITY)
Admission: EM | Admit: 2022-07-03 | Discharge: 2022-07-04 | Disposition: A | Discharge status: Home Health | Payer: Medicare Other | Attending: Internal Medicine | Admitting: Internal Medicine

## 2022-07-03 ENCOUNTER — Encounter (HOSPITAL_COMMUNITY): Payer: Self-pay

## 2022-07-03 ENCOUNTER — Telehealth: Payer: Self-pay | Admitting: Family Medicine

## 2022-07-03 ENCOUNTER — Other Ambulatory Visit: Payer: Self-pay

## 2022-07-03 DIAGNOSIS — I10 Essential (primary) hypertension: Secondary | ICD-10-CM | POA: Diagnosis present

## 2022-07-03 DIAGNOSIS — Z7901 Long term (current) use of anticoagulants: Secondary | ICD-10-CM | POA: Insufficient documentation

## 2022-07-03 DIAGNOSIS — E119 Type 2 diabetes mellitus without complications: Secondary | ICD-10-CM | POA: Insufficient documentation

## 2022-07-03 DIAGNOSIS — G3183 Dementia with Lewy bodies: Secondary | ICD-10-CM | POA: Diagnosis not present

## 2022-07-03 DIAGNOSIS — I480 Paroxysmal atrial fibrillation: Secondary | ICD-10-CM | POA: Diagnosis not present

## 2022-07-03 DIAGNOSIS — R001 Bradycardia, unspecified: Secondary | ICD-10-CM | POA: Diagnosis not present

## 2022-07-03 DIAGNOSIS — R634 Abnormal weight loss: Secondary | ICD-10-CM | POA: Diagnosis not present

## 2022-07-03 DIAGNOSIS — R410 Disorientation, unspecified: Principal | ICD-10-CM | POA: Diagnosis present

## 2022-07-03 DIAGNOSIS — E440 Moderate protein-calorie malnutrition: Secondary | ICD-10-CM | POA: Diagnosis not present

## 2022-07-03 DIAGNOSIS — I959 Hypotension, unspecified: Secondary | ICD-10-CM | POA: Diagnosis not present

## 2022-07-03 DIAGNOSIS — Z96643 Presence of artificial hip joint, bilateral: Secondary | ICD-10-CM | POA: Insufficient documentation

## 2022-07-03 DIAGNOSIS — F05 Delirium due to known physiological condition: Secondary | ICD-10-CM

## 2022-07-03 DIAGNOSIS — R442 Other hallucinations: Secondary | ICD-10-CM | POA: Diagnosis not present

## 2022-07-03 DIAGNOSIS — Z79899 Other long term (current) drug therapy: Secondary | ICD-10-CM | POA: Insufficient documentation

## 2022-07-03 DIAGNOSIS — F0280 Dementia in other diseases classified elsewhere without behavioral disturbance: Secondary | ICD-10-CM | POA: Diagnosis not present

## 2022-07-03 DIAGNOSIS — R627 Adult failure to thrive: Secondary | ICD-10-CM | POA: Diagnosis not present

## 2022-07-03 DIAGNOSIS — I2510 Atherosclerotic heart disease of native coronary artery without angina pectoris: Secondary | ICD-10-CM | POA: Diagnosis not present

## 2022-07-03 DIAGNOSIS — R400 Somnolence: Secondary | ICD-10-CM | POA: Diagnosis not present

## 2022-07-03 DIAGNOSIS — R4182 Altered mental status, unspecified: Secondary | ICD-10-CM | POA: Diagnosis not present

## 2022-07-03 LAB — COMPREHENSIVE METABOLIC PANEL
ALT: 13 U/L (ref 0–44)
AST: 20 U/L (ref 15–41)
Albumin: 3.1 g/dL — ABNORMAL LOW (ref 3.5–5.0)
Alkaline Phosphatase: 85 U/L (ref 38–126)
Anion gap: 7 (ref 5–15)
BUN: 19 mg/dL (ref 8–23)
CO2: 25 mmol/L (ref 22–32)
Calcium: 9 mg/dL (ref 8.9–10.3)
Chloride: 108 mmol/L (ref 98–111)
Creatinine, Ser: 0.94 mg/dL (ref 0.61–1.24)
GFR, Estimated: >60 mL/min (ref >60)
Glucose, Bld: 87 mg/dL (ref 70–99)
Potassium: 4.4 mmol/L (ref 3.5–5.1)
Sodium: 140 mmol/L (ref 135–145)
Total Bilirubin: 0.8 mg/dL (ref 0.3–1.2)
Total Protein: 5.9 g/dL — ABNORMAL LOW (ref 6.5–8.1)

## 2022-07-03 LAB — CBC
HCT: 41.5 % (ref 39.0–52.0)
Hemoglobin: 13 g/dL (ref 13.0–17.0)
MCH: 29.1 pg (ref 26.0–34.0)
MCHC: 31.3 g/dL (ref 30.0–36.0)
MCV: 92.8 fL (ref 80.0–100.0)
Platelets: 230 10*3/uL (ref 150–400)
RBC: 4.47 MIL/uL (ref 4.22–5.81)
RDW: 15.2 % (ref 11.5–15.5)
WBC: 9.7 10*3/uL (ref 4.0–10.5)
nRBC: 0 % (ref 0.0–0.2)

## 2022-07-03 LAB — TROPONIN I (HIGH SENSITIVITY)
Troponin I (High Sensitivity): 13 ng/L (ref <18)
Troponin I (High Sensitivity): 10 ng/L (ref <18)

## 2022-07-03 LAB — URINALYSIS, ROUTINE W REFLEX MICROSCOPIC
Bilirubin Urine: NEGATIVE
Glucose, UA: NEGATIVE mg/dL
Hgb urine dipstick: NEGATIVE
Ketones, ur: NEGATIVE mg/dL
Leukocytes,Ua: NEGATIVE
Nitrite: NEGATIVE
Protein, ur: NEGATIVE mg/dL
Specific Gravity, Urine: 1.017 (ref 1.005–1.030)
pH: 5 (ref 5.0–8.0)

## 2022-07-03 LAB — TSH: TSH: 0.82 u[IU]/mL (ref 0.350–4.500)

## 2022-07-03 MED ORDER — MEMANTINE HCL 10 MG PO TABS
5.0000 mg | ORAL_TABLET | Freq: Every evening | ORAL | Status: DC
  Administered 2022-07-03: 5 mg via ORAL
  Filled 2022-07-03: qty 1

## 2022-07-03 MED ORDER — ENSURE ENLIVE PO LIQD
237.0000 mL | Freq: Two times a day (BID) | ORAL | Status: DC
  Administered 2022-07-04 (×2): 237 mL via ORAL
  Filled 2022-07-03 (×2): qty 237

## 2022-07-03 MED ORDER — DONEPEZIL HCL 10 MG PO TABS
10.0000 mg | ORAL_TABLET | Freq: Every day | ORAL | Status: DC
  Administered 2022-07-03: 10 mg via ORAL
  Filled 2022-07-03: qty 1

## 2022-07-03 MED ORDER — SODIUM CHLORIDE 0.9 % IV BOLUS
500.0000 mL | Freq: Once | INTRAVENOUS | Status: AC
  Administered 2022-07-03: 500 mL via INTRAVENOUS

## 2022-07-03 MED ORDER — POLYETHYLENE GLYCOL 3350 17 G PO PACK: 17.0000 g | PACK | Freq: Every day | ORAL | Status: DC | PRN

## 2022-07-03 MED ORDER — ISOSORBIDE MONONITRATE ER 30 MG PO TB24: 30.0000 mg | ORAL_TABLET | Freq: Every day | ORAL | Status: DC

## 2022-07-03 MED ORDER — SODIUM CHLORIDE 0.9 % IV SOLN: INTRAVENOUS | Status: AC

## 2022-07-03 MED ORDER — AMLODIPINE BESYLATE 5 MG PO TABS: 5.0000 mg | ORAL_TABLET | Freq: Every day | ORAL | Status: DC

## 2022-07-03 MED ORDER — ACETAMINOPHEN 500 MG PO TABS: 500.0000 mg | ORAL_TABLET | Freq: Three times a day (TID) | ORAL | Status: DC | PRN

## 2022-07-03 MED ORDER — FINASTERIDE 5 MG PO TABS
5.0000 mg | ORAL_TABLET | Freq: Every evening | ORAL | Status: DC
  Administered 2022-07-03: 5 mg via ORAL
  Filled 2022-07-03: qty 1

## 2022-07-03 MED ORDER — SENNA 8.6 MG PO TABS: 1.0000 | ORAL_TABLET | Freq: Every day | ORAL | Status: DC | PRN

## 2022-07-03 MED ORDER — APIXABAN 5 MG PO TABS
5.0000 mg | ORAL_TABLET | Freq: Two times a day (BID) | ORAL | Status: DC
  Administered 2022-07-03 – 2022-07-04 (×2): 5 mg via ORAL
  Filled 2022-07-03 (×2): qty 1

## 2022-07-03 MED ORDER — DOCUSATE SODIUM 100 MG PO CAPS
200.0000 mg | ORAL_CAPSULE | Freq: Every day | ORAL | Status: DC
  Administered 2022-07-03: 200 mg via ORAL
  Filled 2022-07-03: qty 2

## 2022-07-03 MED ORDER — LOSARTAN POTASSIUM 50 MG PO TABS: 50.0000 mg | ORAL_TABLET | Freq: Every evening | ORAL | Status: DC

## 2022-07-03 MED ORDER — HYDRALAZINE HCL 20 MG/ML IJ SOLN: 5.0000 mg | Freq: Four times a day (QID) | INTRAMUSCULAR | Status: DC | PRN

## 2022-07-03 MED ORDER — PANTOPRAZOLE SODIUM 40 MG PO TBEC
40.0000 mg | DELAYED_RELEASE_TABLET | Freq: Every day | ORAL | Status: DC
  Administered 2022-07-04: 40 mg via ORAL
  Filled 2022-07-03: qty 1

## 2022-07-03 MED ORDER — OLANZAPINE 10 MG IM SOLR: 2.5000 mg | Freq: Four times a day (QID) | INTRAMUSCULAR | Status: DC | PRN

## 2022-07-03 MED ORDER — CYANOCOBALAMIN 500 MCG PO TABS
500.0000 ug | ORAL_TABLET | Freq: Every day | ORAL | Status: DC
  Administered 2022-07-03 – 2022-07-04 (×2): 500 ug via ORAL
  Filled 2022-07-03 (×2): qty 1

## 2022-07-03 NOTE — ED Notes (Signed)
Labs collect prior to coming to this Rns room

## 2022-07-03 NOTE — H&P (Addendum)
History and Physical    Jonathan [name] DTO:[id number] DOB: [birth year] DOA: 07/03/2022  PCP: Josetta Huddle, MD (Confirm with patient/family/NH records and if not entered, this has to be entered at [facility] point of entry) Patient coming from: Home  I have personally briefly reviewed patient's old medical records in [place]  Chief Complaint: AMS  HPI: Jonathan [name] is a 88 y.o. male with medical history significant of Lewy body dementia, PAF on Eliquis, HTN, CAD, BPH, remote MI, brought in by family member for behavioral changes.  Patient has Lewy body dementia diagnosed while 5 years ago, at baseline, patient daily life totally dependent on his wife and caregiver 24/7.  The only thing he could do himself is feeding himself with right hand which increasingly has a worsening tremor for last 4+ weeks.  Family also reported worsening of ambulation status in last 4 weeks, patient uses able to support himself with walker to walk short distance however because of increasing leg weakness and muscle wasting, he has been wheelchair-bound for last 3 to 4 weeks.  In addition, wife after the patient has had sundowning with confusion and agitation in the evenings but most occasions can be handled without any medication.  Yesterday evening patient had a a severe episode of agitation, and eventually around 930 patient was able to put him to bed and sleep.  However, wife noticed the patient has not urinated since yesterday evening until this morning and wonder he was dehydrated.  Wife checks patient blood pressure every morning, and this morning, when checking patient's blood pressure was found his blood pressure systolic in the 98P, diastolic 38S, she rechecked 2 times and got similar readings and decided to call EMS.  Wife denied patient has any signs of cold clammy, lightheaded or pale looking.  No recent medication changes, no recent history of UTIs.  No diarrhea.  ED Course: Afebrile, heart rate low 60s,  BP 107/58.  CT brain no acute findings.  CBC BMP largely within normal limits.  Review of Systems: Unable to perform, patient has advanced dementia.  Past Medical History:  Diagnosis Date   Acne rosacea    Arthritis    Atrial fibrillation (HCC)    Borderline diabetes    CAD (coronary artery disease)    Diabetes mellitus without complication (HCC)    Diverticulosis    Elevated PSA    Esophageal stricture    GERD (gastroesophageal reflux disease)    History of nuclear stress test    Nuclear stress test 6/18: EF 29, small apical defect (?old MI); no ischemia; Low Risk   Hypertension    Myocardial infarction ([place])    Neck fracture ([place]) 2015   PONV (postoperative nausea and vomiting) 08/09/2014   Shoulder pain     Past Surgical History:  Procedure Laterality Date   BALLOON DILATION  04/02/2012   Procedure: BALLOON DILATION;  Surgeon: Arta Silence, MD;  Location: [facility] ENDOSCOPY;  Service: Endoscopy;  Laterality: N/A;   BALLOON DILATION N/A 06/16/2013   Procedure: BALLOON DILATION;  Surgeon: Arta Silence, MD;  Location: [facility];  Service: Endoscopy;  Laterality: N/A;   BALLOON DILATION N/A 11/16/2014   Procedure: BALLOON DILATION;  Surgeon: Arta Silence, MD;  Location: [facility]OS[facility];  Service: Endoscopy;  Laterality: N/A;   BALLOON DILATION N/A 02/13/2017   Procedure: BALLOON DILATION;  Surgeon: Arta Silence, MD;  Location: [facility] ENDOSCOPY;  Service: Endoscopy;  Laterality: N/A;   CORNEAL TRANSPLANT     ESOPHAGOGASTRODUODENOSCOPY  04/02/2012   Procedure:  ESOPHAGOGASTRODUODENOSCOPY (EGD);  Surgeon: Arta Silence, MD;  Location: [facility];  Service: Endoscopy;  Laterality: N/A;   ESOPHAGOGASTRODUODENOSCOPY N/A 05/28/2013   Procedure: ESOPHAGOGASTRODUODENOSCOPY (EGD);  Surgeon: Cleotis Nipper, MD;  Location: [facility];  Service: Endoscopy;  Laterality: N/A;   ESOPHAGOGASTRODUODENOSCOPY N/A 02/20/2015   Procedure: ESOPHAGOGASTRODUODENOSCOPY (EGD);  Surgeon: Arta Silence, MD;   Location: [facility];  Service: Endoscopy;  Laterality: N/A;   ESOPHAGOGASTRODUODENOSCOPY (EGD) WITH PROPOFOL N/A 06/16/2013   Procedure: ESOPHAGOGASTRODUODENOSCOPY (EGD) WITH PROPOFOL;  Surgeon: Arta Silence, MD;  Location: [facility];  Service: Endoscopy;  Laterality: N/A;   ESOPHAGOGASTRODUODENOSCOPY (EGD) WITH PROPOFOL N/A 11/16/2014   Procedure: ESOPHAGOGASTRODUODENOSCOPY (EGD) WITH PROPOFOL;  Surgeon: Arta Silence, MD;  Location: [facility];  Service: Endoscopy;  Laterality: N/A;   ESOPHAGOGASTRODUODENOSCOPY (EGD) WITH PROPOFOL N/A 02/13/2017   Procedure: ESOPHAGOGASTRODUODENOSCOPY (EGD) WITH PROPOFOL;  Surgeon: Arta Silence, MD;  Location: [place];  Service: Endoscopy;  Laterality: N/A;   EYE SURGERY     cataract sx   HERNIA REPAIR     x 2   TOTAL HIP ARTHROPLASTY     bilateral     reports that he has never smoked. He has never used smokeless tobacco. He reports that he does not drink alcohol and does not use drugs.  Allergies  Allergen Reactions   Atorvastatin Other (See Comments)    Affected muscles in the legs    Family History  Family history unknown: Yes     Prior to Admission medications   Medication Sig Start Date End Date Taking? Authorizing Provider  acetaminophen (TYLENOL) 500 MG tablet Take 500-1,000 mg by mouth 3 (three) times daily as needed for mild pain.   Yes [provider]  amLODipine (NORVASC) 5 MG tablet Take 5 mg by mouth daily. 02/05/18  Yes [provider]  apixaban (ELIQUIS) 5 MG TABS tablet TAKE 1 TABLET(5 MG) BY MOUTH TWICE DAILY Patient taking differently: Take 5 mg by mouth in the morning and at bedtime. 05/17/22  Yes Nahser, Wonda Cheng, MD  ATHLETES FOOT, CLOTRIMAZOLE, 1 % cream Apply 1 Application topically daily as needed (for "jock itch"). 01/12/21  Yes [provider]  cholecalciferol (VITAMIN D3) 25 MCG (1000 UNIT) tablet Take 1,000 Units by mouth daily.   Yes [provider]  docusate sodium  (COLACE) 100 MG capsule Take 200 mg by mouth at bedtime.   Yes [provider]  donepezil (ARICEPT) 10 MG tablet Take 1 tablet (10 mg total) by mouth at bedtime. 01/08/22  Yes Lomax, Amy, NP  finasteride (PROSCAR) 5 MG tablet Take 5 mg by mouth every evening.   Yes [provider]  isosorbide mononitrate (IMDUR) 30 MG 24 hr tablet Take 30 mg by mouth daily with lunch.  01/21/18  Yes [provider]  loratadine (CLARITIN) 10 MG tablet Take 10 mg by mouth at bedtime.   Yes [provider]  losartan (COZAAR) 50 MG tablet Take 50 mg by mouth every evening. 02/27/21  Yes [provider]  memantine (NAMENDA) 5 MG tablet Take 5 mg by mouth every evening.   Yes [provider]  Multiple Vitamins-Minerals (CENTRUM SILVER 50+MEN) TABS Take 1 tablet by mouth daily with breakfast.   Yes [provider]  nitroGLYCERIN (NITROSTAT) 0.4 MG SL tablet Place 1 tablet (0.4 mg total) under the tongue every 5 (five) minutes as needed for chest pain. Patient taking differently: Place 0.4 mg under the tongue every 5 (five) minutes x 3 doses as needed for chest pain.  01/17/22  Yes Swinyer, Lanice Schwab, NP  Nutritional Supplements (ENSURE PO) Take 237 mLs by mouth See admin instructions. Ensure Lactose-Free: Drink 237 ml's by mouth 2 times a day as needed for supplementation   Yes [provider]  pantoprazole (PROTONIX) 40 MG tablet Take 40 mg by mouth daily before breakfast.   Yes [provider]  polyethylene glycol powder (GLYCOLAX/MIRALAX) 17 GM/SCOOP powder Take 17 g by mouth daily as needed for mild constipation or moderate constipation.   Yes [provider]  senna (SENOKOT) 8.6 MG TABS tablet Take 1 tablet by mouth daily as needed for mild constipation or moderate constipation.   Yes [provider]  thiamine (VITAMIN B-1) 100 MG tablet Take 100 mg by mouth in the morning.   Yes [provider]  vitamin B-12  (CYANOCOBALAMIN) 500 MCG tablet Take 500 mcg by mouth daily.   Yes [provider]    Physical Exam: Vitals:   07/03/22 1830 07/03/22 1842 07/03/22 1845 07/03/22 1915  BP: 138/83  130/66 (!) 140/86  Pulse: 70  (!) 55 66  Resp: '15  17 15  '$ Temp:  97.7 F (36.5 C)    TempSrc:  Oral    SpO2: 98%  100% 100%    Constitutional: NAD, calm, comfortable Vitals:   07/03/22 1830 07/03/22 1842 07/03/22 1845 07/03/22 1915  BP: 138/83  130/66 (!) 140/86  Pulse: 70  (!) 55 66  Resp: '15  17 15  '$ Temp:  97.7 F (36.5 C)    TempSrc:  Oral    SpO2: 98%  100% 100%   Eyes: PERRL, lids and conjunctivae normal ENMT: Mucous membranes are moist. Posterior pharynx clear of any exudate or lesions.Normal dentition.  Neck: normal, supple, no masses, no thyromegaly Respiratory: clear to auscultation bilaterally, no wheezing, no crackles. Normal respiratory effort. No accessory muscle use.  Cardiovascular: Regular rate and rhythm, no murmurs / rubs / gallops. No extremity edema. 2+ pedal pulses. No carotid bruits.  Abdomen: no tenderness, no masses palpated. No hepatosplenomegaly. Bowel sounds positive.  Musculoskeletal: no clubbing / cyanosis. No joint deformity upper and lower extremities. Good ROM, no contractures. Normal muscle tone.  Skin: no rashes, lesions, ulcers. No induration Neurologic: No facial droops, moving all limbs, not following command, significant coarse tremors on bilateral upper extremity right> left Psychiatric: Awake, confused    Labs on Admission: I have personally reviewed following labs and imaging studies  CBC: Recent Labs  Lab 07/03/22 1538  WBC 9.7  HGB 13.0  HCT 41.5  MCV 92.8  PLT 409   Basic Metabolic Panel: Recent Labs  Lab 07/03/22 1538  NA 140  K 4.4  CL 108  CO2 25  GLUCOSE 87  BUN 19  CREATININE 0.94  CALCIUM 9.0   GFR: CrCl cannot be calculated (Unknown ideal weight.). Liver Function Tests: Recent Labs  Lab 07/03/22 1538  AST 20   ALT 13  ALKPHOS 85  BILITOT 0.8  PROT 5.9*  ALBUMIN 3.1*   No results for input(s): "LIPASE", "AMYLASE" in the last 168 hours. No results for input(s): "AMMONIA" in the last 168 hours. Coagulation Profile: No results for input(s): "INR", "PROTIME" in the last 168 hours. Cardiac Enzymes: No results for input(s): "CKTOTAL", "CKMB", "CKMBINDEX", "TROPONINI" in the last 168 hours. BNP (last 3 results) No results for input(s): "PROBNP" in the last 8760 hours. HbA1C: No results for input(s): "HGBA1C" in the last 72 hours. CBG: No results for input(s): "GLUCAP" in the last 168 hours.  Lipid Profile: No results for input(s): "CHOL", "HDL", "LDLCALC", "TRIG", "CHOLHDL", "LDLDIRECT" in the last 72 hours. Thyroid Function Tests: No results for input(s): "TSH", "T4TOTAL", "FREET4", "T3FREE", "THYROIDAB" in the last 72 hours. Anemia Panel: No results for input(s): "VITAMINB12", "FOLATE", "FERRITIN", "TIBC", "IRON", "RETICCTPCT" in the last 72 hours. Urine analysis:    Component Value Date/Time   COLORURINE YELLOW 07/03/2022 1654   APPEARANCEUR CLEAR 07/03/2022 1654   LABSPEC 1.017 07/03/2022 1654   PHURINE 5.0 07/03/2022 1654   GLUCOSEU NEGATIVE 07/03/2022 1654   HGBUR NEGATIVE 07/03/2022 1654   BILIRUBINUR NEGATIVE 07/03/2022 1654   KETONESUR NEGATIVE 07/03/2022 1654   PROTEINUR NEGATIVE 07/03/2022 1654   NITRITE NEGATIVE 07/03/2022 1654   LEUKOCYTESUR NEGATIVE 07/03/2022 1654    Radiological Exams on Admission: CT HEAD WO CONTRAST (5MM)  Result Date: 07/03/2022 CLINICAL DATA:  Altered level of consciousness, dementia EXAM: CT HEAD WITHOUT CONTRAST TECHNIQUE: Contiguous axial images were obtained from the base of the skull through the vertex without intravenous contrast. RADIATION DOSE REDUCTION: This exam was performed according to the departmental dose-optimization program which includes automated exposure control, adjustment of the mA and/or kV according to patient size and/or use  of iterative reconstruction technique. COMPARISON:  04/12/2022 FINDINGS: Brain: No acute infarct or hemorrhage. Lateral ventricles and midline structures are stable. Stable chronic small vessel ischemic changes are seen within the periventricular white matter. No acute extra-axial fluid collections. No mass effect. Vascular: No hyperdense vessel or unexpected calcification. Skull: Normal. Negative for fracture or focal lesion. Sinuses/Orbits: No acute finding. Other: None. IMPRESSION: 1. Stable head CT, no acute intracranial process. Electronically Signed   By: Randa Ngo M.D.   On: 07/03/2022 17:43    EKG: Independently reviewed.  A-fib no acute ST changes.  Assessment/Plan Principal Problem:   Delirium Active Problems:   Delirium due to multiple etiologies, acute, hyperactive  (please populate well all problems here in Problem List. (For example, if patient is on BP meds at home and you resume or decide to hold them, it is a problem that needs to be her. Same for CAD, COPD, HLD and so on)  Delirium -Clinically suspect declining of baseline Lewy body dementia -Metabolic etiology need to be ruled out, TSH B12 RPR pending, CT head negative for acute findings. -Long discussion with patient wife at bedside, given there is a significant one-sided tremor and bilateral lower extremity weakness with worsening over the last 3 to 4 weeks, ordered brain MRI for the morning. -Avoid benzos -We will use as needed Zyprexa for agitation episode. -Overall, long-term prognosis poor given the already poor baseline function with significant fast declining over the last 4 weeks.  Patient is DNR and wife already for palliative care consult and wish to take of the patient herself at home.  PT evaluation and consult case management for possible home care.  Question of hypotension -No hypotension recorded in the ED, but given the family reported that patient only urinated 1 time since yesterday evening suspected  dehydration volume contraction.  There is also possibility of orthostatic dysfunction from reported dementia. -Check orthostatic hypotension given the prolonged history of Lewy body dementia -There is also question about bradycardia, hold off beta-blocker, telemetry monitoring 24 hours. -Short course of IV fluid x12 hours  Lewy body dementia -As above. -Memantine and Aricept  Dysphagia dysphagia -Stable on mechanical soft diet, no history of aspiration as per wife.  Unintentional weight loss -Lost about 8 pounds in last 4 weeks, consult dietitian and calorie count.  HTN -Hold off home BP meds including amlodipine losartan and Imdur, on as needed hydralazine, plan to reintroduce home BP meds stepwise.  PAF -Rate controlled, continue Eliquis  BPH -UA appears to be clean.  No indication for antibiotics  DVT prophylaxis: Eliquis Code Status: DNR Family Communication: Wife at bedside Disposition Plan: Expect less than 2 midnight hospital stay Consults called: None Admission status: Telemetry observation   Lequita Halt MD [facility] Pager [phone]  07/03/2022, 7:33 PM

## 2022-07-03 NOTE — ED Triage Notes (Signed)
Pt bib ems from home c/o AMS since Monday. Pt dementia baseline. Pt normally can hold conversations but lately only been giving one word answers. Pt has had a decline in urine output and appetite. Pt had increased agitation and hallucination last night which is out the normal.   Hx Lewy Body    Pt was released from rehab 06/20/2022 for back surgery.   Pt was hypotensive upon arrival 90/50. Pt received a 500 cc bolus normal saline.   BP 107/58 HR 60 RA 96% T98.4 CBG 165

## 2022-07-03 NOTE — Telephone Encounter (Signed)
Mrs Shad called to let us know that while she was trying to get him ready to leave for his appt with me, caregiver checked BP which was 99/34. Repeat pressure was 99/40. She has not acted normally in the last 24 hours and is not communicating with her. He is unable to stand (can normally walk with walker). He had an episode last night where he was aggressive with the caregiver which is out of character for him. I have instructed Mrs Tinkey to call 911 and have him evaluated asap. She was encouraged to call us back with an update and we will be happy to reschedule appt. She verbalizes understanding and agreement with this plan.

## 2022-07-03 NOTE — Progress Notes (Signed)
Arrived on the unit per stretcher from ED. Wife with pt. No distress noted

## 2022-07-03 NOTE — ED Notes (Signed)
Pt brief and sheets slightly wet from pt urinal spilling - pt placed in new brief and clean and dry sheets placed under patient at this time

## 2022-07-03 NOTE — ED Provider Notes (Signed)
[facility] EMERGENCY DEPARTMENT Provider Note   CSN: [id number] Arrival date & time: 07/03/22  1438     History Lewy body dementia, Afib not anticoagulated Chief Complaint  Patient presents with   Altered Mental Status    Jonathan [name] is a 88 y.o. male.  88 year old male with medical history of Lewy body dementia presents to the ED for altered mental status.  Patient coming from home, according to records recent admission in July for paroxysmal A-fib.  Did have surgery of his L3 compression fracture last month and was discharged from rehabilitation on June 20, 2022.  She had to be hypotensive on arrival with a systolic in the 54S diastolic in the 56C given saline 500 bolus with some improvement in symptoms.  Patient unable to answer any questions at this time, follow commands.  The history is provided by medical records and the EMS personnel.  Altered Mental Status      Home Medications Prior to Admission medications   Medication Sig Start Date End Date Taking? Authorizing Provider  acetaminophen (TYLENOL) 500 MG tablet Take 500-1,000 mg by mouth 3 (three) times daily as needed for mild pain.   Yes [provider]  amLODipine (NORVASC) 5 MG tablet Take 5 mg by mouth daily. 02/05/18  Yes [provider]  apixaban (ELIQUIS) 5 MG TABS tablet TAKE 1 TABLET(5 MG) BY MOUTH TWICE DAILY Patient taking differently: Take 5 mg by mouth in the morning and at bedtime. 05/17/22  Yes Nahser, Wonda Cheng, MD  ATHLETES FOOT, CLOTRIMAZOLE, 1 % cream Apply 1 Application topically daily as needed (for "jock itch"). 01/12/21  Yes [provider]  cholecalciferol (VITAMIN D3) 25 MCG (1000 UNIT) tablet Take 1,000 Units by mouth daily.   Yes [provider]  docusate sodium (COLACE) 100 MG capsule Take 200 mg by mouth at bedtime.   Yes [provider]  donepezil (ARICEPT) 10 MG tablet Take 1 tablet (10 mg total) by mouth at bedtime. 01/08/22   Yes Lomax, Amy, NP  finasteride (PROSCAR) 5 MG tablet Take 5 mg by mouth every evening.   Yes [provider]  isosorbide mononitrate (IMDUR) 30 MG 24 hr tablet Take 30 mg by mouth daily with lunch.  01/21/18  Yes [provider]  loratadine (CLARITIN) 10 MG tablet Take 10 mg by mouth at bedtime.   Yes [provider]  losartan (COZAAR) 50 MG tablet Take 50 mg by mouth every evening. 02/27/21  Yes [provider]  memantine (NAMENDA) 5 MG tablet Take 5 mg by mouth every evening.   Yes [provider]  Multiple Vitamins-Minerals (CENTRUM SILVER 50+MEN) TABS Take 1 tablet by mouth daily with breakfast.   Yes [provider]  nitroGLYCERIN (NITROSTAT) 0.4 MG SL tablet Place 1 tablet (0.4 mg total) under the tongue every 5 (five) minutes as needed for chest pain. Patient taking differently: Place 0.4 mg under the tongue every 5 (five) minutes x 3 doses as needed for chest pain. 01/17/22  Yes Swinyer, Lanice Schwab, NP  Nutritional Supplements (ENSURE PO) Take 237 mLs by mouth See admin instructions. Ensure Lactose-Free: Drink 237 ml's by mouth 2 times a day as needed for supplementation   Yes [provider]  pantoprazole (PROTONIX) 40 MG tablet Take 40 mg by mouth daily before breakfast.   Yes [provider]  polyethylene glycol powder (GLYCOLAX/MIRALAX) 17 GM/SCOOP powder Take 17 g by mouth daily as needed for mild constipation or moderate constipation.  Yes [provider]  senna (SENOKOT) 8.6 MG TABS tablet Take 1 tablet by mouth daily as needed for mild constipation or moderate constipation.   Yes [provider]  thiamine (VITAMIN B-1) 100 MG tablet Take 100 mg by mouth in the morning.   Yes [provider]  vitamin B-12 (CYANOCOBALAMIN) 500 MCG tablet Take 500 mcg by mouth daily.   Yes [provider]      Allergies    Atorvastatin    Review of Systems   Review of Systems  Unable to perform  ROS: Dementia    Physical Exam Updated Vital Signs BP 138/83   Pulse 70   Temp 97.7 F (36.5 C) (Oral)   Resp 15   SpO2 98%  Physical Exam Vitals and nursing note reviewed.  Constitutional:      Appearance: Normal appearance.  HENT:     Head: Normocephalic and atraumatic.     Mouth/Throat:     Mouth: Mucous membranes are moist.  Cardiovascular:     Rate and Rhythm: Rhythm irregular.  Pulmonary:     Effort: Pulmonary effort is normal.     Breath sounds: No wheezing.  Chest:     Comments: TLSO brace in place.  Abdominal:     General: Abdomen is flat.     Tenderness: There is no abdominal tenderness.  Musculoskeletal:     Cervical back: Normal range of motion and neck supple.  Skin:    General: Skin is warm and dry.  Neurological:     Mental Status: He is alert. He is disoriented.     ED Results / Procedures / Treatments   Labs (all labs ordered are listed, but only abnormal results are displayed) Labs Reviewed  COMPREHENSIVE METABOLIC PANEL - Abnormal; Notable for the following components:      Result Value   Total Protein 5.9 (*)    Albumin 3.1 (*)    All other components within normal limits  CBC  URINALYSIS, ROUTINE W REFLEX MICROSCOPIC  VITAMIN B12  TSH  RPR  CBG MONITORING, ED  TROPONIN I (HIGH SENSITIVITY)  TROPONIN I (HIGH SENSITIVITY)    EKG None  Radiology CT HEAD WO CONTRAST (5MM)  Result Date: 07/03/2022 CLINICAL DATA:  Altered level of consciousness, dementia EXAM: CT HEAD WITHOUT CONTRAST TECHNIQUE: Contiguous axial images were obtained from the base of the skull through the vertex without intravenous contrast. RADIATION DOSE REDUCTION: This exam was performed according to the departmental dose-optimization program which includes automated exposure control, adjustment of the mA and/or kV according to patient size and/or use of iterative reconstruction technique. COMPARISON:  04/12/2022 FINDINGS: Brain: No acute infarct or hemorrhage. Lateral  ventricles and midline structures are stable. Stable chronic small vessel ischemic changes are seen within the periventricular white matter. No acute extra-axial fluid collections. No mass effect. Vascular: No hyperdense vessel or unexpected calcification. Skull: Normal. Negative for fracture or focal lesion. Sinuses/Orbits: No acute finding. Other: None. IMPRESSION: 1. Stable head CT, no acute intracranial process. Electronically Signed   By: Randa Ngo M.D.   On: 07/03/2022 17:43    Procedures Procedures    Medications Ordered in ED Medications  sodium chloride 0.9 % bolus 500 mL (500 mLs Intravenous Bolus 07/03/22 1641)    ED Course/ Medical Decision Making/ A&P                           Medical Decision Making Amount and/or Complexity of Data  Reviewed Labs: ordered. Radiology: ordered.    This patient presents to the ED for concern of AMS, this involves a number of treatment options, and is a complaint that carries with it a high risk of complications and morbidity.  The differential diagnosis includes CVA, worsening dementia versus infection.    Co morbidities: Discussed in HPI   Brief History:  Patient presents from home for changes in behavior including aggression with wife yesterday.  Has been having worsening decline in mental status.  According to wife he used to be able to hold conversation, has not done so in the last couple of days.  EMR reviewed including pt PMHx, past surgical history and past visits to ER.   See HPI for more details   Lab Tests:  I ordered and independently interpreted labs.  The pertinent results include:    I personally reviewed all laboratory work and imaging. Metabolic panel without any acute abnormality specifically kidney function within normal limits and no significant electrolyte abnormalities. CBC without leukocytosis or significant anemia.   Imaging Studies:  NAD. I personally reviewed all imaging studies and no acute  abnormality found. I agree with radiology interpretation.  Cardiac Monitoring:  The patient was maintained on a cardiac monitor.  I personally viewed and interpreted the cardiac monitored which showed an underlying rhythm of: Irregularly irregular EKG non-ischemic  Consults:  I requested consultation with social work,  and discussed lab and imaging findings as well as pertinent plan - they recommend: patient does not qualify for placement at this time.   Reevaluation:  After the interventions noted above I re-evaluated patient and found that they have :stayed the same   Social Determinants of Health:  Social work/case management involved Patient does not meet criteria for placement at this time, patient will need boarding in the ED in order to determine disposition.  Problem List / ED Course:  Patient presents to the ED from home after episodes of aggression, change in mental status per wife.  He has had minimal oral intake, minimal urine output and has refused to eat.  Patient is anticoagulated according to records on Eliquis.  CT of his head did not show any acute finding.  Labs have been within normal limits.  Patient does have assistance at home however his wife who is in similar age takes care of him around-the-clock.  He is hemodynamically stable received a 500 bolus with normal blood pressure.  Afebrile.  I do feel the patient needs admission for failure to thrive versus mental status images.   Dispostion:  After consideration of the diagnostic results and the patients response to treatment, I feel that the patent would benefit from admission for further evaluation.  Spoke to Dr. Roosevelt Locks who will admit patient at this time.    Portions of this note were generated with [employer]. Dictation errors may occur despite best attempts at proofreading.  Final Clinical Impression(s) / ED Diagnoses Final diagnoses:  Altered mental status, unspecified altered mental  status type  Adult failure to thrive    Rx / DC Orders ED Discharge Orders     None         Janeece Fitting, PA-C 07/03/22 1852    Elnora Morrison, MD 07/04/22 1414

## 2022-07-03 NOTE — ED Notes (Signed)
EMS stated they seen slow Afib with no hx

## 2022-07-04 ENCOUNTER — Observation Stay (HOSPITAL_COMMUNITY): Payer: Medicare Other

## 2022-07-04 DIAGNOSIS — I10 Essential (primary) hypertension: Secondary | ICD-10-CM

## 2022-07-04 DIAGNOSIS — R410 Disorientation, unspecified: Secondary | ICD-10-CM | POA: Diagnosis not present

## 2022-07-04 DIAGNOSIS — G3183 Dementia with Lewy bodies: Secondary | ICD-10-CM | POA: Diagnosis not present

## 2022-07-04 DIAGNOSIS — R4182 Altered mental status, unspecified: Secondary | ICD-10-CM

## 2022-07-04 DIAGNOSIS — I2510 Atherosclerotic heart disease of native coronary artery without angina pectoris: Secondary | ICD-10-CM | POA: Diagnosis not present

## 2022-07-04 DIAGNOSIS — R531 Weakness: Secondary | ICD-10-CM | POA: Diagnosis not present

## 2022-07-04 DIAGNOSIS — F0280 Dementia in other diseases classified elsewhere without behavioral disturbance: Secondary | ICD-10-CM | POA: Diagnosis not present

## 2022-07-04 DIAGNOSIS — I480 Paroxysmal atrial fibrillation: Secondary | ICD-10-CM | POA: Diagnosis not present

## 2022-07-04 DIAGNOSIS — R634 Abnormal weight loss: Secondary | ICD-10-CM | POA: Diagnosis not present

## 2022-07-04 DIAGNOSIS — R627 Adult failure to thrive: Secondary | ICD-10-CM | POA: Diagnosis not present

## 2022-07-04 DIAGNOSIS — G319 Degenerative disease of nervous system, unspecified: Secondary | ICD-10-CM | POA: Diagnosis not present

## 2022-07-04 DIAGNOSIS — E119 Type 2 diabetes mellitus without complications: Secondary | ICD-10-CM | POA: Diagnosis not present

## 2022-07-04 DIAGNOSIS — E440 Moderate protein-calorie malnutrition: Secondary | ICD-10-CM | POA: Diagnosis not present

## 2022-07-04 DIAGNOSIS — Z7901 Long term (current) use of anticoagulants: Secondary | ICD-10-CM | POA: Diagnosis not present

## 2022-07-04 DIAGNOSIS — R29898 Other symptoms and signs involving the musculoskeletal system: Secondary | ICD-10-CM | POA: Diagnosis not present

## 2022-07-04 DIAGNOSIS — Z79899 Other long term (current) drug therapy: Secondary | ICD-10-CM | POA: Diagnosis not present

## 2022-07-04 LAB — RPR
RPR Ser Ql: NONREACTIVE
RPR Ser Ql: NONREACTIVE

## 2022-07-04 LAB — VITAMIN B12
Vitamin B-12: 412 pg/mL (ref 180–914)
Vitamin B-12: 716 pg/mL (ref 180–914)

## 2022-07-04 LAB — CBC
HCT: 37.4 % — ABNORMAL LOW (ref 39.0–52.0)
Hemoglobin: 12.5 g/dL — ABNORMAL LOW (ref 13.0–17.0)
MCH: 29.8 pg (ref 26.0–34.0)
MCHC: 33.4 g/dL (ref 30.0–36.0)
MCV: 89 fL (ref 80.0–100.0)
Platelets: 189 10*3/uL (ref 150–400)
RBC: 4.2 MIL/uL — ABNORMAL LOW (ref 4.22–5.81)
RDW: 14.9 % (ref 11.5–15.5)
WBC: 8.9 10*3/uL (ref 4.0–10.5)
nRBC: 0 % (ref 0.0–0.2)

## 2022-07-04 LAB — PROCALCITONIN: Procalcitonin: <0.1 ng/mL

## 2022-07-04 LAB — BASIC METABOLIC PANEL
Anion gap: 8 (ref 5–15)
BUN: 12 mg/dL (ref 8–23)
CO2: 23 mmol/L (ref 22–32)
Calcium: 8.9 mg/dL (ref 8.9–10.3)
Chloride: 111 mmol/L (ref 98–111)
Creatinine, Ser: 0.73 mg/dL (ref 0.61–1.24)
GFR, Estimated: >60 mL/min (ref >60)
Glucose, Bld: 90 mg/dL (ref 70–99)
Potassium: 4.1 mmol/L (ref 3.5–5.1)
Sodium: 142 mmol/L (ref 135–145)

## 2022-07-04 MED ORDER — AMLODIPINE BESYLATE 5 MG PO TABS: 2.5000 mg | ORAL_TABLET | Freq: Every day | ORAL | Status: DC

## 2022-07-04 MED ORDER — AMLODIPINE BESYLATE 2.5 MG PO TABS: 2.5000 mg | ORAL_TABLET | Freq: Every day | ORAL | 0 refills | Status: AC

## 2022-07-04 NOTE — Progress Notes (Signed)
Hgb recollect results 12.5. Now Ca++ from 1st draw is 6.1 and K+ is almost critical. Will redraw again since 1st Hgb was in error and they were from the same sample of bld

## 2022-07-04 NOTE — Progress Notes (Signed)
Lab called and pt's Hbg was 9.4 this am and yesterday in ED it was 13. Will recollect

## 2022-07-04 NOTE — TOC Transition Note (Signed)
Transition of Care [facility]) - CM/SW Discharge Note   Patient Details  Name: Jonathan [name] MRN: [id number] Date of Birth: [birth year]  [facility]) CM/SW Contact:  Cyndi Bender, RN Phone Number: 07/04/2022, 1:40 PM   Clinical Narrative:    Patient is stable for discharge.  Spoke to wife regarding transition needs. Patient was active with [facility]. Spoke to [place] and new orders for [facility] placed and care will resume Friday or Saturday.   Wife states the caregivers have a Lucianne Lei and can transport patient home.   MD aware that any new prescriptions are to be sent to [facility].      Patient Goals and CMS Choice Patient states their goals for this hospitalization and ongoing recovery are:: wife wants  him to return home CMS Medicare.gov Compare Post Acute Care list provided to:: Patient Represenative (must comment) Choice offered to / list presented to : Spouse  Discharge Placement               home        Discharge Plan and Services   Discharge Planning Services: CM Consult Post Acute Care Choice: Home Health                    HH Arranged: PT, OT, Nurse's Aide, Speech Therapy, Social Work [employer] Agency: Tour manager Date [place]: 07/04/22 Time [place]: 1339 Representative spoke with at [place]: [place] Determinants of Health ([place]) Interventions     Readmission Risk Interventions     No data to display

## 2022-07-04 NOTE — Discharge Summary (Signed)
Physician Discharge Summary  Jonathan [name] ZOX:[id number] DOB: 1931-07-15 DOA: 07/03/2022  PCP: Josetta Huddle, MD  Admit date: 07/03/2022 Discharge date: 07/04/2022  Admitted From: Home Disposition:  Home  Recommendations for Outpatient Follow-up:  Follow up with PCP in 1-2 weeks   Home Health:YES>> PT/OT/SLP/SW/Aide   Discharge Condition:Stable CODE STATUS: DNR Diet recommendation: Heart Healthy   Brief/Interim Summary:  Zeno Hickel is a 88 y.o. male with medical history significant of Lewy body dementia, PAF on Eliquis, HTN, CAD, BPH, remote MI, brought in by family member for behavioral changes. Patient has Lewy body dementia diagnosed while 5 years ago, at baseline, patient daily life totally dependent on his wife and caregiver 24/7.  The only thing he could do himself is feeding himself with right hand which increasingly has a worsening tremor for last 4+ weeks.  Family also reported worsening of ambulation status in last 4 weeks, patient uses able to support himself with walker to walk short distance however because of increasing leg weakness and muscle wasting, he has been wheelchair-bound for last 3 to 4 weeks.  In addition, wife after the patient has had sundowning with confusion and agitation in the evenings but most occasions can be handled without any medication.  Yesterday evening patient had a a severe episode of agitation, and eventually around 930 patient was able to put him to bed and sleep.  However, wife noticed the patient has not urinated since yesterday evening until this morning and wonder he was dehydrated. -As well per wife she checked blood pressure was noted to be on the lower side, so she called EMS to bring him to the hospital.     Altered mental status/delirium -Patient presented with fluctuating mental status, but overall steadily decline over the last few weeks, this is most like related to his declining baseline Louis body dementia, with intermittent  episodes of delirium. -He was admitted for further work-up, no acute finding on CT head or MRI brain -  TSH, B12 all within normal limit  -No significant labs abnormalities, no evidence of infection on chest x-ray or UA -Patient was seen by PT, will arrange for maximum home support including PT/OT/SLP/SW and aide   Question of hypotension -No hypotension recorded in the ED, but given the family reported that patient only urinated 1 time since yesterday evening suspected dehydration volume  -Patient was kept on IV fluids overnight, his antihypertensive medications has been held, blood pressure this morning is acceptable. -Given his blood pressures acceptable despite being off all his medications, I will discontinue his losartan and isosorbide mononitrate and change his Norvasc to half dose 5 mg> 2.5 mg p.o. daily.   Lewy body dementia -As above. -Memantine and Aricept   Dysphagia dysphagia -Stable on mechanical soft diet, no history of aspiration as per wife. -We will arrange for SLP at home   Unintentional weight loss -Lost about 8 pounds in last 4 weeks, consult dietitian and calorie count.   HTN -Please see above discussion   PAF -Rate controlled, continue Eliquis   BPH -UA appears to be clean.  No indication for antibiotics, continue with finasteride  Discharge Diagnoses:  Principal Problem:   Delirium Active Problems:   CAD (coronary artery disease)   Essential hypertension   Delirium due to multiple etiologies, acute, hyperactive   Malnutrition of moderate degree   FTT (failure to thrive) in adult    Discharge Instructions  Discharge Instructions     Diet - low sodium heart healthy   Complete  by: As directed    Diet - low sodium heart healthy   Complete by: As directed    Discharge instructions   Complete by: As directed    Discharge instructions   Complete by: As directed    Follow with Primary MD Josetta Huddle, MD in 7 days   Get CBC, CMP,  checked  by  Primary MD next visit.    Activity: As tolerated with Full fall precautions use walker/cane & assistance as needed   Disposition Home    Diet: Heart Healthy  On your next visit with your primary care physician please Get Medicines reviewed and adjusted.   Please request your Prim.MD to go over all Hospital Tests and Procedure/Radiological results at the follow up, please get all Hospital records sent to your Prim MD by signing hospital release before you go home.   If you experience worsening of your admission symptoms, develop shortness of breath, life threatening emergency, suicidal or homicidal thoughts you must seek medical attention immediately by calling 911 or calling your MD immediately  if symptoms less severe.  You Must read complete instructions/literature along with all the possible adverse reactions/side effects for all the Medicines you take and that have been prescribed to you. Take any new Medicines after you have completely understood and accpet all the possible adverse reactions/side effects.   Do not drive, operating heavy machinery, perform activities at heights, swimming or participation in water activities or provide baby sitting services if your were admitted for syncope or siezures until you have seen by Primary MD or a Neurologist and advised to do so again.  Do not drive when taking Pain medications.    Do not take more than prescribed Pain, Sleep and Anxiety Medications  Special Instructions: If you have smoked or chewed Tobacco  in the last 2 yrs please stop smoking, stop any regular Alcohol  and or any Recreational drug use.  Wear Seat belts while driving.   Please note  You were cared for by a hospitalist during your hospital stay. If you have any questions about your discharge medications or the care you received while you were in the hospital after you are discharged, you can call the unit and asked to speak with the hospitalist on call if the  hospitalist that took care of you is not available. Once you are discharged, your primary care physician will handle any further medical issues. Please note that NO REFILLS for any discharge medications will be authorized once you are discharged, as it is imperative that you return to your primary care physician (or establish a relationship with a primary care physician if you do not have one) for your aftercare needs so that they can reassess your need for medications and monitor your lab values.   Increase activity slowly   Complete by: As directed    Increase activity slowly   Complete by: As directed       Allergies as of 07/04/2022       Reactions   Atorvastatin Other (See Comments)   Affected muscles in the legs        Medication List     STOP taking these medications    isosorbide mononitrate 30 MG 24 hr tablet Commonly known as: IMDUR   losartan 50 MG tablet Commonly known as: COZAAR       TAKE these medications    acetaminophen 500 MG tablet Commonly known as: TYLENOL Take 500-1,000 mg by mouth 3 (three) times daily as  needed for mild pain.   amLODipine 5 MG tablet Commonly known as: NORVASC Take 0.5 tablets (2.5 mg total) by mouth daily. What changed: how much to take   apixaban 5 MG Tabs tablet Commonly known as: Eliquis TAKE 1 TABLET(5 MG) BY MOUTH TWICE DAILY What changed:  how much to take how to take this when to take this additional instructions   Athletes Foot (Clotrimazole) 1 % cream Generic drug: clotrimazole Apply 1 Application topically daily as needed (for "jock itch").   Centrum Silver 50+Men Tabs Take 1 tablet by mouth daily with breakfast.   cholecalciferol 25 MCG (1000 UNIT) tablet Commonly known as: VITAMIN D3 Take 1,000 Units by mouth daily.   docusate sodium 100 MG capsule Commonly known as: COLACE Take 200 mg by mouth at bedtime.   donepezil 10 MG tablet Commonly known as: ARICEPT Take 1 tablet (10 mg total) by mouth at  bedtime.   ENSURE PO Take 237 mLs by mouth See admin instructions. Ensure Lactose-Free: Drink 237 ml's by mouth 2 times a day as needed for supplementation   finasteride 5 MG tablet Commonly known as: PROSCAR Take 5 mg by mouth every evening.   loratadine 10 MG tablet Commonly known as: CLARITIN Take 10 mg by mouth at bedtime.   memantine 5 MG tablet Commonly known as: NAMENDA Take 5 mg by mouth every evening.   nitroGLYCERIN 0.4 MG SL tablet Commonly known as: NITROSTAT Place 1 tablet (0.4 mg total) under the tongue every 5 (five) minutes as needed for chest pain. What changed: when to take this   pantoprazole 40 MG tablet Commonly known as: PROTONIX Take 40 mg by mouth daily before breakfast.   polyethylene glycol powder 17 GM/SCOOP powder Commonly known as: GLYCOLAX/MIRALAX Take 17 g by mouth daily as needed for mild constipation or moderate constipation.   senna 8.6 MG Tabs tablet Commonly known as: SENOKOT Take 1 tablet by mouth daily as needed for mild constipation or moderate constipation.   thiamine 100 MG tablet Commonly known as: Vitamin B-1 Take 100 mg by mouth in the morning.   vitamin B-12 500 MCG tablet Commonly known as: CYANOCOBALAMIN Take 500 mcg by mouth daily.        Allergies  Allergen Reactions   Atorvastatin Other (See Comments)    Affected muscles in the legs    Consultations: None   Procedures/Studies: MR BRAIN WO CONTRAST  Result Date: 07/04/2022 CLINICAL DATA:  Provided history: Delirium. Additional history provided: Tremor, bilateral lower extremity weakness. EXAM: MRI HEAD WITHOUT CONTRAST TECHNIQUE: Multiplanar, multiecho pulse sequences of the brain and surrounding structures were obtained without intravenous contrast. COMPARISON:  Head CT 07/03/2022. Brain MRI 02/12/2020. FINDINGS: Susceptibility artifact arising from a metallic foreign body imbedded within the left temporal scalp. This artifact obscures portions of the  intracranial contents on the left on multiple sequences. Within this limitation, findings are as follows: Brain: Mild for age generalized cerebral atrophy. Mild multifocal T2 FLAIR hyperintense signal abnormality within the cerebral white matter and pons, nonspecific but compatible with chronic small vessel ischemic disease. Punctate chronic microhemorrhages within the left parietal and high left frontal lobes. There is no acute infarct. No evidence of an intracranial mass. No extra-axial fluid collection. No midline shift. Vascular: Maintained flow voids within the proximal large arterial vessels. Skull and upper cervical spine: No focal suspicious marrow lesion. Slight C3-C4 grade 1 retrolisthesis. Sinuses/Orbits: No mass or acute finding within the imaged orbits. Prior bilateral ocular lens replacement. Small mucous retention  cyst within the left maxillary sinus. IMPRESSION: The examination is limited by susceptibility artifact arising from a metallic foreign body imbedded within the left temporal scalp. Within this limitation, there is no evidence of acute intracranial abnormality. Mild generalized cerebral atrophy and cerebral white matter chronic small vessel ischemic disease, similar to the prior brain MRI of 02/12/2020. Electronically Signed   By: Kellie Simmering D.O.   On: 07/04/2022 11:06   CT HEAD WO CONTRAST (5MM)  Result Date: 07/03/2022 CLINICAL DATA:  Altered level of consciousness, dementia EXAM: CT HEAD WITHOUT CONTRAST TECHNIQUE: Contiguous axial images were obtained from the base of the skull through the vertex without intravenous contrast. RADIATION DOSE REDUCTION: This exam was performed according to the departmental dose-optimization program which includes automated exposure control, adjustment of the mA and/or kV according to patient size and/or use of iterative reconstruction technique. COMPARISON:  04/12/2022 FINDINGS: Brain: No acute infarct or hemorrhage. Lateral ventricles and midline  structures are stable. Stable chronic small vessel ischemic changes are seen within the periventricular white matter. No acute extra-axial fluid collections. No mass effect. Vascular: No hyperdense vessel or unexpected calcification. Skull: Normal. Negative for fracture or focal lesion. Sinuses/Orbits: No acute finding. Other: None. IMPRESSION: 1. Stable head CT, no acute intracranial process. Electronically Signed   By: Randa Ngo M.D.   On: 07/03/2022 17:43      Subjective: No significant events overnight as discussed with staff, wife at bedside denies any significant events as well, patient himself denies any complaints, but he is  Discharge Exam: Vitals:   07/04/22 0400 07/04/22 0800  BP: (!) 149/72 126/76  Pulse: 72 61  Resp: (!) 21 18  Temp: 98.2 F (36.8 C) 98.6 F (37 C)  SpO2: 92% 95%   Vitals:   07/03/22 2212 07/03/22 2250 07/04/22 0400 07/04/22 0800  BP: 135/77  (!) 149/72 126/76  Pulse: 69 61 72 61  Resp: 16 17 (!) 21 18  Temp: (!) 97.2 F (36.2 C)  98.2 F (36.8 C) 98.6 F (37 C)  TempSrc: Axillary  Oral Axillary  SpO2: 92% 99% 92% 95%  Weight: 68.6 kg     Height: '5\' 7"'$  (1.702 m)       General: Pt is alert, awake, teamly frail, deconditioned, no apparent distress, oriented x1 Cardiovascular: RRR, S1/S2 +, no rubs, no gallops Respiratory: CTA bilaterally, no wheezing, no rhonchi Abdominal: Soft, NT, ND, bowel sounds + Extremities: no edema, no cyanosis    The results of significant diagnostics from this hospitalization (including imaging, microbiology, ancillary and laboratory) are listed below for reference.     Microbiology: No results found for this or any previous visit (from the past 240 hour(s)).   Labs: BNP (last 3 results) No results for input(s): "BNP" in the last 8760 hours. Basic Metabolic Panel: Recent Labs  Lab 07/03/22 1538 07/04/22 0759  NA 140 142  K 4.4 4.1  CL 108 111  CO2 25 23  GLUCOSE 87 90  BUN 19 12  CREATININE 0.94  0.73  CALCIUM 9.0 8.9   Liver Function Tests: Recent Labs  Lab 07/03/22 1538  AST 20  ALT 13  ALKPHOS 85  BILITOT 0.8  PROT 5.9*  ALBUMIN 3.1*   No results for input(s): "LIPASE", "AMYLASE" in the last 168 hours. No results for input(s): "AMMONIA" in the last 168 hours. CBC: Recent Labs  Lab 07/03/22 1538 07/04/22 0437  WBC 9.7 8.9  HGB 13.0 12.5*  HCT 41.5 37.4*  MCV 92.8 89.0  PLT 230 189   Cardiac Enzymes: No results for input(s): "CKTOTAL", "CKMB", "CKMBINDEX", "TROPONINI" in the last 168 hours. BNP: Invalid input(s): "POCBNP" CBG: No results for input(s): "GLUCAP" in the last 168 hours. D-Dimer No results for input(s): "DDIMER" in the last 72 hours. Hgb A1c No results for input(s): "HGBA1C" in the last 72 hours. Lipid Profile No results for input(s): "CHOL", "HDL", "LDLCALC", "TRIG", "CHOLHDL", "LDLDIRECT" in the last 72 hours. Thyroid function studies Recent Labs    07/03/22 1837  TSH 0.820   Anemia work up Recent Labs    07/04/22 0302 07/04/22 0759  VITAMINB12 412 716   Urinalysis    Component Value Date/Time   COLORURINE YELLOW 07/03/2022 1654   APPEARANCEUR CLEAR 07/03/2022 1654   LABSPEC 1.017 07/03/2022 1654   PHURINE 5.0 07/03/2022 1654   GLUCOSEU NEGATIVE 07/03/2022 1654   HGBUR NEGATIVE 07/03/2022 1654   BILIRUBINUR NEGATIVE 07/03/2022 1654   KETONESUR NEGATIVE 07/03/2022 1654   PROTEINUR NEGATIVE 07/03/2022 1654   NITRITE NEGATIVE 07/03/2022 1654   LEUKOCYTESUR NEGATIVE 07/03/2022 1654   Sepsis Labs Recent Labs  Lab 07/03/22 1538 07/04/22 0437  WBC 9.7 8.9   Microbiology No results found for this or any previous visit (from the past 240 hour(s)).   Time coordinating discharge: Over 30 minutes  SIGNED:   Phillips Climes, MD  [facility] 07/04/2022, 3:07 PM Pager   If 7PM-7AM, please contact night-coverage [email or web address]

## 2022-07-04 NOTE — Progress Notes (Signed)
  Transition of Care [facility]) Screening Note   Patient Details  Name: Jonathan [name] Date of Birth: Oct 16, 1930   Transition of Care [facility]) CM/SW Contact:    Cyndi Bender, RN Phone Number: 07/04/2022, 9:40 AM    Transition of Care Department [facility]) has reviewed patient and no TOC needs have been identified at this time. We will continue to monitor patient advancement through interdisciplinary progression rounds. If new patient transition needs arise, please place a TOC consult.

## 2022-07-04 NOTE — TOC Initial Note (Signed)
Transition of Care [facility]) - Initial/Assessment Note    Patient Details  Name: Jonathan [name] MRN: [id number] Date of Birth: 04/03/1931  Transition of Care [facility]) CM/SW Contact:    Verdell Carmine, RN Phone Number: 07/04/2022, 10:35 AM  Clinical Narrative:                  Patient is from home, he has all DME needed ( Wheelchair, hospital bed, walker, BSC).  He has aides that come 9-2 and 5-9p to give showers etc., He awakens sometimes during night. Has been in SNF previously, wife feels he is more appropriate at home, but is willing to do what is recommended, she has some health issues as well. Both her sons live next door, so she has good family support.  Expected Discharge Plan: [place] Barriers to Discharge: Continued Medical Work up   Patient Goals and CMS Choice        Expected Discharge Plan and Services Expected Discharge Plan: [place]       Living arrangements for the past 2 months: Single Family Home                                      Prior Living Arrangements/Services Living arrangements for the past 2 months: Single Family Home Lives with:: Spouse Patient language and need for interpreter reviewed:: Yes        Need for Family Participation in Patient Care: Yes (Comment) Care giver support system in place?: Yes (comment) Current home services: DME, Homehealth aide Criminal Activity/Legal Involvement Pertinent to Current Situation/Hospitalization: No - Comment as needed  Activities of Daily Living Home Assistive Devices/Equipment: Brace (specify type), Bedside commode/3-in-1, Grab bars around toilet, Grab bars in shower, Blood pressure cuff, Wheelchair, Environmental consultant (specify type), Shower chair with back, Hospital bed (back brace when he gets out of bed, front wheeled walker) ADL Screening (condition at time of admission) Patient's cognitive ability adequate to safely complete daily activities?: No Is the patient  deaf or have difficulty hearing?: No Does the patient have difficulty seeing, even when wearing glasses/contacts?: No Does the patient have difficulty concentrating, remembering, or making decisions?: Yes Patient able to express need for assistance with ADLs?: No Does the patient have difficulty dressing or bathing?: Yes Independently performs ADLs?: No Communication: Independent (but confused. Sometimes doesn't make sense) Dressing (OT): Dependent Is this a change from baseline?: Pre-admission baseline Grooming: Dependent Is this a change from baseline?: Pre-admission baseline Feeding: Needs assistance Is this a change from baseline?: Pre-admission baseline Bathing: Dependent Is this a change from baseline?: Pre-admission baseline Toileting: Dependent Is this a change from baseline?: Pre-admission baseline In/Out Bed: Dependent Is this a change from baseline?: Pre-admission baseline Walks in Home: Dependent Is this a change from baseline?: Pre-admission baseline Does the patient have difficulty walking or climbing stairs?: Yes Weakness of Legs: Both Weakness of Arms/Hands: Both  Permission Sought/Granted                  Emotional Assessment       Orientation: : Fluctuating Orientation (Suspected and/or reported Sundowners) Alcohol / Substance Use: Not Applicable Psych Involvement: No (comment)  Admission diagnosis:  Adult failure to thrive [R62.7] Delirium [R41.0] Altered mental status, unspecified altered mental status type [R41.82] Patient Active Problem List   Diagnosis Date Noted   Delirium 07/03/2022   Abnormal gait 12/05/2021  Angina pectoris ([place]) 12/05/2021   Benign prostatic hyperplasia 12/05/2021   Bradycardia 12/05/2021   Bronchitis 12/05/2021   Cerebral atrophy ([place]) 12/05/2021   Decubitus ulcer of buttock, stage 1 12/05/2021   Dysphagia 12/05/2021   Hearing loss 12/05/2021   Hyperlipidemia, unspecified 12/05/2021   Hypoxemia 12/05/2021    Lightheadedness 12/05/2021   Obesity 12/05/2021   Actinic keratosis 12/05/2021   Paresthesia 12/05/2021   Parkinsonian features 12/05/2021   Psoriasis 12/05/2021   Recurrent falls 12/05/2021   Psychophysical visual disturbance 12/05/2021   Shuffling gait 12/05/2021   Sundowning 12/05/2021   Upper respiratory infection 12/05/2021   Type 2 diabetes mellitus without complications ([place]) 59/56/3875   Unspecified dementia without behavioral disturbance 07/10/2020   Delirium due to multiple etiologies, acute, hyperactive 06/28/2020   Gout, unspecified 10/15/2019   Long term (current) use of anticoagulants 10/15/2019   Old myocardial infarction 10/15/2019   Other thrombophilia ([place]) 10/15/2019   Pain in joint of right hip 05/21/2019   Obstructive sleep apnea on CPAP 10/05/2018   Aspiration pneumonia due to gastric secretions (HCC)    Hypervolemia    Hypokalemia 03/15/2018   Hypomagnesemia 03/15/2018   Bladder mass 03/15/2018   Small bowel obstruction ([place]) 03/11/2018   Diabetes mellitus ([place]) 03/11/2018   GERD (gastroesophageal reflux disease) 03/11/2018   Esophageal stricture 03/11/2018   SBO (small bowel obstruction) (HCC)    Symptomatic sinus bradycardia    Paroxysmal atrial fibrillation ([place]) 02/14/2017   Essential hypertension 02/14/2017   CAD (coronary artery disease) 07/10/2015   Fatigue 07/10/2015   PONV (postoperative nausea and vomiting) 08/09/2014   Brow ptosis 06/08/2013   Dermatochalasis 09/23/2012   Diabetes mellitus type 2, diet-controlled ([place]) 09/23/2012   After-cataract 12/26/2011   Bilateral dry eyes 12/26/2011   Blepharitis 10/03/2011   Status post corneal transplant 10/03/2011   Pseudophakia, both eyes 08/09/2006   PCP:  Josetta Huddle, MD Pharmacy:   [place], [place] - [address] [place] [address] Phone: [phone] Fax: [place] [address] [place] - [address] [facility]  [place] [address] Phone: [phone] Fax: [phone]     Social Determinants of Health (SDOH) Interventions    Readmission Risk Interventions     No data to display

## 2022-07-04 NOTE — Evaluation (Signed)
Physical Therapy Evaluation Patient Details Name: Jonathan [name] MRN: [id number] DOB: Feb 21, 1931 Today's Date: 07/04/2022  History of Present Illness  pt is an 88 y/o male admitted 9/20 with acute behavioral changes.  PMY significant for Lewy body dementia, PAF, HTN, CAD, BPH  Clinical Impression  Pt is at or close to baseline functioning and should be safe at home with available assist from wife and PCA. There are no further acute PT needs.  Will sign off at this time.        Recommendations for follow up therapy are one component of a multi-disciplinary discharge planning process, led by the attending physician.  Recommendations may be updated based on patient status, additional functional criteria and insurance authorization.  Follow Up Recommendations Home health PT      Assistance Recommended at Discharge Frequent or constant Supervision/Assistance  Patient can return home with the following  Assistance with cooking/housework;Direct supervision/assist for medications management;Direct supervision/assist for financial management;Assist for transportation;A lot of help with bathing/dressing/bathroom;A lot of help with walking and/or transfers    Equipment Recommendations None recommended by PT  Recommendations for Other Services       Functional Status Assessment Patient has had a recent decline in their functional status and demonstrates the ability to make significant improvements in function in a reasonable and predictable amount of time.     Precautions / Restrictions Precautions Precautions: Fall      Mobility  Bed Mobility Overal bed mobility: Needs Assistance Bed Mobility: Supine to Sit, Sit to Supine     Supine to sit: Mod assist Sit to supine: Mod assist   General bed mobility comments: multimodal cues for initiation, direction and sequencing, truncal assist for rolling and transitions.    Transfers Overall transfer level: Needs assistance   Transfers:  Sit to/from Stand, Bed to chair/wheelchair/BSC Sit to Stand: Mod assist Stand pivot transfers: Mod assist         General transfer comment: multimodal cues and boost/stability assist    Ambulation/Gait   Gait Distance (Feet): 3 Feet Assistive device: Rolling walker (2 wheels) Gait Pattern/deviations: Step-to pattern, Step-through pattern       General Gait Details: moderate cue/assist for movement away from/to bed with RW.  Stairs            Wheelchair Mobility    Modified Rankin (Stroke Patients Only)       Balance Overall balance assessment: Needs assistance Sitting-balance support: No upper extremity supported Sitting balance-Leahy Scale: Fair Sitting balance - Comments: min guard     Standing balance-Leahy Scale: Poor Standing balance comment: reliant on external support and/or AD                             Pertinent Vitals/Pain Pain Assessment Pain Assessment: Faces Faces Pain Scale: No hurt Pain Intervention(s): Monitored during session    Home Living Family/patient expects to be discharged to:: Private residence Living Arrangements: Spouse/significant other Available Help at Discharge: Family;Personal care attendant;Available 24 hours/day Type of Home: House Home Access: Ramped entrance       Home Layout: One level Home Equipment: Rollator (4 wheels);BSC/3in1;Shower seat;Wheelchair - manual;Hospital bed      Prior Function Prior Level of Function : Needs assist  Cognitive Assist : Mobility (cognitive);ADLs (cognitive) Mobility (Cognitive): Step by step cues ADLs (Cognitive): Step by step cues       Mobility Comments: pt is now fairly dependent with transfer, short pivotal steps, no longer functionally  ambulatory ADLs Comments: PCA, helps with ADL's, feeding, transfers, use of depends at times.  PCA helps with management of the house and errands.     Hand Dominance        Extremity/Trunk Assessment   Upper Extremity  Assessment Upper Extremity Assessment: Generalized weakness;Overall [facility] for tasks assessed    Lower Extremity Assessment Lower Extremity Assessment: Generalized weakness;Overall WFL for tasks assessed       Communication   Communication: No difficulties  Cognition Arousal/Alertness: Awake/alert Behavior During Therapy: Restless, Flat affect Overall Cognitive Status: History of cognitive impairments - at baseline                                          General Comments General comments (skin integrity, edema, etc.): Completely got pt dressed for discharge then pt urinated through the incontinence pads.    Exercises     Assessment/Plan    PT Assessment Patient needs continued PT services  PT Problem List Decreased strength;Decreased activity tolerance;Decreased mobility;Decreased coordination;Decreased knowledge of use of DME;Decreased balance       PT Treatment Interventions      PT Goals (Current goals can be found in the Care Plan section)  Acute Rehab PT Goals Patient Stated Goal: home now PT Goal Formulation: With family    Frequency       Co-evaluation               AM-PAC PT "6 Clicks" Mobility  Outcome Measure Help needed turning from your back to your side while in a flat bed without using bedrails?: A Lot Help needed moving from lying on your back to sitting on the side of a flat bed without using bedrails?: A Lot Help needed moving to and from a bed to a chair (including a wheelchair)?: A Lot Help needed standing up from a chair using your arms (e.g., wheelchair or bedside chair)?: A Lot Help needed to walk in hospital room?: A Lot Help needed climbing 3-5 steps with a railing? : Total 6 Click Score: 11    End of Session Equipment Utilized During Treatment: Back brace Activity Tolerance: Patient tolerated treatment well Patient left: in bed;with call bell/phone within reach;with family/visitor present;with bed alarm set Nurse  Communication: Mobility status PT Visit Diagnosis: Unsteadiness on feet (R26.81);Other symptoms and signs involving the nervous system (R29.898)    Time: 1411-1500 PT Time Calculation (min) (ACUTE ONLY): 49 min   Charges:   PT Evaluation $PT Eval Moderate Complexity: 1 Mod PT Treatments $Therapeutic Activity: 8-22 mins $Self Care/Home Management: 8-22        07/04/2022  Ginger Carne., PT Acute Rehabilitation Services [phone]  (office)  Tessie Fass Talyn Dessert 07/04/2022, 3:20 PM

## 2022-07-04 NOTE — Progress Notes (Signed)
Initial Nutrition Assessment  DOCUMENTATION CODES:   Non-severe (moderate) malnutrition in context of chronic illness  INTERVENTION:  - Continue Ensure BID.   NUTRITION DIAGNOSIS:   Moderate Malnutrition related to chronic illness as evidenced by mild fat depletion, moderate muscle depletion.  GOAL:   Patient will meet greater than or equal to 90% of their needs  MONITOR:   PO intake, Supplement acceptance, Weight trends  REASON FOR ASSESSMENT:   Other (Comment) (Calorie Count)    ASSESSMENT:   88 y.o. male admits related to behavioral changes. PMH includes: Lewy body dementia, HTN, CAD and MI. Pt is currently receiving medical management for delirium.  Scheduled Meds: VitB12, Colace, Ensure BID. Labs WDL.   Consult was placed for a nutrition assessment and calorie count. Pt was sleeping at time of assessment but did wake to sound of voice. Due to hx of dementia pt was a poor historian and unable to provide nutritional history. Per H&P it is noted that the pt has lost 8 lbs in the past 4 weeks. RD unable to verify with wt hx records. However, pt did agree to NFPE and RD was able to detect Moderate Malnutrition.   RD was ale to view breakfast tray. Pt ate <25% of his breakfast tray this am. However, pt did drink some of his orange juice and milk. Pt already has Ensure BID ordered. No supplements present at bedside. RD reached out to RN to verify supplement consumption.   NUTRITION - FOCUSED PHYSICAL EXAM:  Flowsheet Row Most Recent Value  Orbital Region No depletion  Upper Arm Region Mild depletion  Thoracic and Lumbar Region Unable to assess  Buccal Region Moderate depletion  Temple Region Moderate depletion  Clavicle Bone Region Moderate depletion  Clavicle and Acromion Bone Region Moderate depletion  Scapular Bone Region Unable to assess  Dorsal Hand Moderate depletion  Patellar Region Unable to assess  Anterior Thigh Region Moderate depletion  Posterior Calf  Region Moderate depletion  Edema (RD Assessment) None  Hair Reviewed  Eyes Reviewed  Mouth Reviewed  Skin Reviewed  Nails Reviewed  [pale nail beds]       Diet Order:   Diet Order             DIET DYS 3 Room service appropriate? Yes; Fluid consistency: Thin  Diet effective now                   EDUCATION NEEDS:   Not appropriate for education at this time  Skin:  Skin Assessment: Reviewed RN Assessment  Last BM:  07/02/22  Height:   Ht Readings from Last 1 Encounters:  07/03/22 '5\' 7"'$  (1.702 m)    Weight:   Wt Readings from Last 1 Encounters:  07/03/22 68.6 kg    Ideal Body Weight:  67.3 kg  BMI:  Body mass index is 23.69 kg/m.  Estimated Nutritional Needs:   Kcal:  1700-2050 kcal  Protein:  85-100 gm  Fluid:  >1.7 L  Thalia Bloodgood, RD, LDN, CNSC

## 2022-07-04 NOTE — Care Management Obs Status (Signed)
[place] NOTIFICATION   Patient Details  Name: Jonathan [name] MRN: [id number] Date of Birth: 24-Jun-1931   Medicare Observation Status Notification Given:  Yes    Verdell Carmine, RN 07/04/2022, 10:33 AM

## 2022-07-04 NOTE — Discharge Instructions (Signed)
Follow with Primary MD Josetta Huddle, MD in 7 days   Get CBC, CMP,  checked  by Primary MD next visit.    Disposition Home    Diet: Regular diet   On your next visit with your primary care physician please Get Medicines reviewed and adjusted.   Please request your Prim.MD to go over all Hospital Tests and Procedure/Radiological results at the follow up, please get all Hospital records sent to your Prim MD by signing hospital release before you go home.   If you experience worsening of your admission symptoms, develop shortness of breath, life threatening emergency, suicidal or homicidal thoughts you must seek medical attention immediately by calling 911 or calling your MD immediately  if symptoms less severe.  You Must read complete instructions/literature along with all the possible adverse reactions/side effects for all the Medicines you take and that have been prescribed to you. Take any new Medicines after you have completely understood and accpet all the possible adverse reactions/side effects.   Do not drive, operating heavy machinery, perform activities at heights, swimming or participation in water activities or provide baby sitting services if your were admitted for syncope or siezures until you have seen by Primary MD or a Neurologist and advised to do so again.  Do not drive when taking Pain medications.    Do not take more than prescribed Pain, Sleep and Anxiety Medications  Special Instructions: If you have smoked or chewed Tobacco  in the last 2 yrs please stop smoking, stop any regular Alcohol  and or any Recreational drug use.  Wear Seat belts while driving.   Please note  You were cared for by a hospitalist during your hospital stay. If you have any questions about your discharge medications or the care you received while you were in the hospital after you are discharged, you can call the unit and asked to speak with the hospitalist on call if the hospitalist that  took care of you is not available. Once you are discharged, your primary care physician will handle any further medical issues. Please note that NO REFILLS for any discharge medications will be authorized once you are discharged, as it is imperative that you return to your primary care physician (or establish a relationship with a primary care physician if you do not have one) for your aftercare needs so that they can reassess your need for medications and monitor your lab values.

## 2022-07-05 DIAGNOSIS — E1159 Type 2 diabetes mellitus with other circulatory complications: Secondary | ICD-10-CM | POA: Diagnosis not present

## 2022-07-05 DIAGNOSIS — S32039D Unspecified fracture of third lumbar vertebra, subsequent encounter for fracture with routine healing: Secondary | ICD-10-CM | POA: Diagnosis not present

## 2022-07-05 DIAGNOSIS — I152 Hypertension secondary to endocrine disorders: Secondary | ICD-10-CM | POA: Diagnosis not present

## 2022-07-05 DIAGNOSIS — F0280 Dementia in other diseases classified elsewhere without behavioral disturbance: Secondary | ICD-10-CM | POA: Diagnosis not present

## 2022-07-05 DIAGNOSIS — R1312 Dysphagia, oropharyngeal phase: Secondary | ICD-10-CM | POA: Diagnosis not present

## 2022-07-05 DIAGNOSIS — G3183 Dementia with Lewy bodies: Secondary | ICD-10-CM | POA: Diagnosis not present

## 2022-07-08 DIAGNOSIS — R1312 Dysphagia, oropharyngeal phase: Secondary | ICD-10-CM | POA: Diagnosis not present

## 2022-07-08 DIAGNOSIS — S32039D Unspecified fracture of third lumbar vertebra, subsequent encounter for fracture with routine healing: Secondary | ICD-10-CM | POA: Diagnosis not present

## 2022-07-08 DIAGNOSIS — F0280 Dementia in other diseases classified elsewhere without behavioral disturbance: Secondary | ICD-10-CM | POA: Diagnosis not present

## 2022-07-08 DIAGNOSIS — G3183 Dementia with Lewy bodies: Secondary | ICD-10-CM | POA: Diagnosis not present

## 2022-07-08 DIAGNOSIS — I152 Hypertension secondary to endocrine disorders: Secondary | ICD-10-CM | POA: Diagnosis not present

## 2022-07-08 DIAGNOSIS — E1159 Type 2 diabetes mellitus with other circulatory complications: Secondary | ICD-10-CM | POA: Diagnosis not present

## 2022-07-09 DIAGNOSIS — S32030D Wedge compression fracture of third lumbar vertebra, subsequent encounter for fracture with routine healing: Secondary | ICD-10-CM | POA: Diagnosis not present

## 2022-07-10 DIAGNOSIS — R1310 Dysphagia, unspecified: Secondary | ICD-10-CM | POA: Diagnosis not present

## 2022-07-10 DIAGNOSIS — I10 Essential (primary) hypertension: Secondary | ICD-10-CM | POA: Diagnosis not present

## 2022-07-10 DIAGNOSIS — R627 Adult failure to thrive: Secondary | ICD-10-CM | POA: Diagnosis not present

## 2022-07-10 DIAGNOSIS — F0390 Unspecified dementia without behavioral disturbance: Secondary | ICD-10-CM | POA: Diagnosis not present

## 2022-07-10 DIAGNOSIS — I209 Angina pectoris, unspecified: Secondary | ICD-10-CM | POA: Diagnosis not present

## 2022-07-10 DIAGNOSIS — L299 Pruritus, unspecified: Secondary | ICD-10-CM | POA: Diagnosis not present

## 2022-07-10 DIAGNOSIS — R350 Frequency of micturition: Secondary | ICD-10-CM | POA: Diagnosis not present

## 2022-07-10 DIAGNOSIS — I4891 Unspecified atrial fibrillation: Secondary | ICD-10-CM | POA: Diagnosis not present

## 2022-07-10 DIAGNOSIS — R4182 Altered mental status, unspecified: Secondary | ICD-10-CM | POA: Diagnosis not present

## 2022-07-10 DIAGNOSIS — R259 Unspecified abnormal involuntary movements: Secondary | ICD-10-CM | POA: Diagnosis not present

## 2022-07-15 DIAGNOSIS — R1312 Dysphagia, oropharyngeal phase: Secondary | ICD-10-CM | POA: Diagnosis not present

## 2022-07-15 DIAGNOSIS — E1159 Type 2 diabetes mellitus with other circulatory complications: Secondary | ICD-10-CM | POA: Diagnosis not present

## 2022-07-15 DIAGNOSIS — F0280 Dementia in other diseases classified elsewhere without behavioral disturbance: Secondary | ICD-10-CM | POA: Diagnosis not present

## 2022-07-15 DIAGNOSIS — I152 Hypertension secondary to endocrine disorders: Secondary | ICD-10-CM | POA: Diagnosis not present

## 2022-07-15 DIAGNOSIS — S32039D Unspecified fracture of third lumbar vertebra, subsequent encounter for fracture with routine healing: Secondary | ICD-10-CM | POA: Diagnosis not present

## 2022-07-15 DIAGNOSIS — G3183 Dementia with Lewy bodies: Secondary | ICD-10-CM | POA: Diagnosis not present

## 2022-07-21 DIAGNOSIS — Z7901 Long term (current) use of anticoagulants: Secondary | ICD-10-CM | POA: Diagnosis not present

## 2022-07-21 DIAGNOSIS — K5790 Diverticulosis of intestine, part unspecified, without perforation or abscess without bleeding: Secondary | ICD-10-CM | POA: Diagnosis not present

## 2022-07-21 DIAGNOSIS — N200 Calculus of kidney: Secondary | ICD-10-CM | POA: Diagnosis not present

## 2022-07-21 DIAGNOSIS — I480 Paroxysmal atrial fibrillation: Secondary | ICD-10-CM | POA: Diagnosis not present

## 2022-07-21 DIAGNOSIS — F0280 Dementia in other diseases classified elsewhere without behavioral disturbance: Secondary | ICD-10-CM | POA: Diagnosis not present

## 2022-07-21 DIAGNOSIS — N400 Enlarged prostate without lower urinary tract symptoms: Secondary | ICD-10-CM | POA: Diagnosis not present

## 2022-07-21 DIAGNOSIS — Z9181 History of falling: Secondary | ICD-10-CM | POA: Diagnosis not present

## 2022-07-21 DIAGNOSIS — Z961 Presence of intraocular lens: Secondary | ICD-10-CM | POA: Diagnosis not present

## 2022-07-21 DIAGNOSIS — M47816 Spondylosis without myelopathy or radiculopathy, lumbar region: Secondary | ICD-10-CM | POA: Diagnosis not present

## 2022-07-21 DIAGNOSIS — K222 Esophageal obstruction: Secondary | ICD-10-CM | POA: Diagnosis not present

## 2022-07-21 DIAGNOSIS — I252 Old myocardial infarction: Secondary | ICD-10-CM | POA: Diagnosis not present

## 2022-07-21 DIAGNOSIS — E1159 Type 2 diabetes mellitus with other circulatory complications: Secondary | ICD-10-CM | POA: Diagnosis not present

## 2022-07-21 DIAGNOSIS — I700 Atherosclerosis of aorta: Secondary | ICD-10-CM | POA: Diagnosis not present

## 2022-07-21 DIAGNOSIS — K219 Gastro-esophageal reflux disease without esophagitis: Secondary | ICD-10-CM | POA: Diagnosis not present

## 2022-07-21 DIAGNOSIS — Z96643 Presence of artificial hip joint, bilateral: Secondary | ICD-10-CM | POA: Diagnosis not present

## 2022-07-21 DIAGNOSIS — G3183 Dementia with Lewy bodies: Secondary | ICD-10-CM | POA: Diagnosis not present

## 2022-07-21 DIAGNOSIS — R1314 Dysphagia, pharyngoesophageal phase: Secondary | ICD-10-CM | POA: Diagnosis not present

## 2022-07-21 DIAGNOSIS — M109 Gout, unspecified: Secondary | ICD-10-CM | POA: Diagnosis not present

## 2022-07-21 DIAGNOSIS — S32039D Unspecified fracture of third lumbar vertebra, subsequent encounter for fracture with routine healing: Secondary | ICD-10-CM | POA: Diagnosis not present

## 2022-07-21 DIAGNOSIS — K8020 Calculus of gallbladder without cholecystitis without obstruction: Secondary | ICD-10-CM | POA: Diagnosis not present

## 2022-07-21 DIAGNOSIS — I2510 Atherosclerotic heart disease of native coronary artery without angina pectoris: Secondary | ICD-10-CM | POA: Diagnosis not present

## 2022-07-21 DIAGNOSIS — Z8781 Personal history of (healed) traumatic fracture: Secondary | ICD-10-CM | POA: Diagnosis not present

## 2022-07-21 DIAGNOSIS — R1312 Dysphagia, oropharyngeal phase: Secondary | ICD-10-CM | POA: Diagnosis not present

## 2022-07-21 DIAGNOSIS — I152 Hypertension secondary to endocrine disorders: Secondary | ICD-10-CM | POA: Diagnosis not present

## 2022-07-21 DIAGNOSIS — M1990 Unspecified osteoarthritis, unspecified site: Secondary | ICD-10-CM | POA: Diagnosis not present

## 2022-07-22 DIAGNOSIS — S32039D Unspecified fracture of third lumbar vertebra, subsequent encounter for fracture with routine healing: Secondary | ICD-10-CM | POA: Diagnosis not present

## 2022-07-22 DIAGNOSIS — R1312 Dysphagia, oropharyngeal phase: Secondary | ICD-10-CM | POA: Diagnosis not present

## 2022-07-22 DIAGNOSIS — G3183 Dementia with Lewy bodies: Secondary | ICD-10-CM | POA: Diagnosis not present

## 2022-07-22 DIAGNOSIS — I152 Hypertension secondary to endocrine disorders: Secondary | ICD-10-CM | POA: Diagnosis not present

## 2022-07-22 DIAGNOSIS — E1159 Type 2 diabetes mellitus with other circulatory complications: Secondary | ICD-10-CM | POA: Diagnosis not present

## 2022-07-22 DIAGNOSIS — F0280 Dementia in other diseases classified elsewhere without behavioral disturbance: Secondary | ICD-10-CM | POA: Diagnosis not present

## 2022-07-23 DIAGNOSIS — Z96641 Presence of right artificial hip joint: Secondary | ICD-10-CM | POA: Diagnosis not present

## 2022-07-23 DIAGNOSIS — I10 Essential (primary) hypertension: Secondary | ICD-10-CM | POA: Diagnosis not present

## 2022-07-23 DIAGNOSIS — E440 Moderate protein-calorie malnutrition: Secondary | ICD-10-CM | POA: Diagnosis not present

## 2022-07-23 DIAGNOSIS — K219 Gastro-esophageal reflux disease without esophagitis: Secondary | ICD-10-CM | POA: Diagnosis not present

## 2022-07-23 DIAGNOSIS — L89620 Pressure ulcer of left heel, unstageable: Secondary | ICD-10-CM | POA: Diagnosis not present

## 2022-07-23 DIAGNOSIS — I480 Paroxysmal atrial fibrillation: Secondary | ICD-10-CM | POA: Diagnosis not present

## 2022-07-23 DIAGNOSIS — M1990 Unspecified osteoarthritis, unspecified site: Secondary | ICD-10-CM | POA: Diagnosis not present

## 2022-07-23 DIAGNOSIS — L723 Sebaceous cyst: Secondary | ICD-10-CM | POA: Diagnosis not present

## 2022-07-23 DIAGNOSIS — Z8701 Personal history of pneumonia (recurrent): Secondary | ICD-10-CM | POA: Diagnosis not present

## 2022-07-23 DIAGNOSIS — Z96642 Presence of left artificial hip joint: Secondary | ICD-10-CM | POA: Diagnosis not present

## 2022-07-23 DIAGNOSIS — I2510 Atherosclerotic heart disease of native coronary artery without angina pectoris: Secondary | ICD-10-CM | POA: Diagnosis not present

## 2022-07-23 DIAGNOSIS — E119 Type 2 diabetes mellitus without complications: Secondary | ICD-10-CM | POA: Diagnosis not present

## 2022-07-23 DIAGNOSIS — K222 Esophageal obstruction: Secondary | ICD-10-CM | POA: Diagnosis not present

## 2022-07-23 DIAGNOSIS — L89610 Pressure ulcer of right heel, unstageable: Secondary | ICD-10-CM | POA: Diagnosis not present

## 2022-07-23 DIAGNOSIS — Z8781 Personal history of (healed) traumatic fracture: Secondary | ICD-10-CM | POA: Diagnosis not present

## 2022-07-23 DIAGNOSIS — M109 Gout, unspecified: Secondary | ICD-10-CM | POA: Diagnosis not present

## 2022-07-23 DIAGNOSIS — G4733 Obstructive sleep apnea (adult) (pediatric): Secondary | ICD-10-CM | POA: Diagnosis not present

## 2022-07-23 DIAGNOSIS — G3183 Dementia with Lewy bodies: Secondary | ICD-10-CM | POA: Diagnosis not present

## 2022-07-23 DIAGNOSIS — N400 Enlarged prostate without lower urinary tract symptoms: Secondary | ICD-10-CM | POA: Diagnosis not present

## 2022-07-23 DIAGNOSIS — F0282 Dementia in other diseases classified elsewhere, unspecified severity, with psychotic disturbance: Secondary | ICD-10-CM | POA: Diagnosis not present

## 2022-07-23 DIAGNOSIS — I252 Old myocardial infarction: Secondary | ICD-10-CM | POA: Diagnosis not present

## 2022-07-23 DIAGNOSIS — J302 Other seasonal allergic rhinitis: Secondary | ICD-10-CM | POA: Diagnosis not present

## 2022-07-23 DIAGNOSIS — F02811 Dementia in other diseases classified elsewhere, unspecified severity, with agitation: Secondary | ICD-10-CM | POA: Diagnosis not present

## 2022-07-23 DIAGNOSIS — R1310 Dysphagia, unspecified: Secondary | ICD-10-CM | POA: Diagnosis not present

## 2022-07-23 DIAGNOSIS — Z9181 History of falling: Secondary | ICD-10-CM | POA: Diagnosis not present

## 2022-07-25 DIAGNOSIS — Z8781 Personal history of (healed) traumatic fracture: Secondary | ICD-10-CM | POA: Diagnosis not present

## 2022-07-25 DIAGNOSIS — G3183 Dementia with Lewy bodies: Secondary | ICD-10-CM | POA: Diagnosis not present

## 2022-07-25 DIAGNOSIS — Z9181 History of falling: Secondary | ICD-10-CM | POA: Diagnosis not present

## 2022-07-25 DIAGNOSIS — L89620 Pressure ulcer of left heel, unstageable: Secondary | ICD-10-CM | POA: Diagnosis not present

## 2022-07-25 DIAGNOSIS — F0282 Dementia in other diseases classified elsewhere, unspecified severity, with psychotic disturbance: Secondary | ICD-10-CM | POA: Diagnosis not present

## 2022-07-25 DIAGNOSIS — F02811 Dementia in other diseases classified elsewhere, unspecified severity, with agitation: Secondary | ICD-10-CM | POA: Diagnosis not present

## 2022-07-29 DIAGNOSIS — F0282 Dementia in other diseases classified elsewhere, unspecified severity, with psychotic disturbance: Secondary | ICD-10-CM | POA: Diagnosis not present

## 2022-07-29 DIAGNOSIS — G3183 Dementia with Lewy bodies: Secondary | ICD-10-CM | POA: Diagnosis not present

## 2022-07-29 DIAGNOSIS — Z8781 Personal history of (healed) traumatic fracture: Secondary | ICD-10-CM | POA: Diagnosis not present

## 2022-07-29 DIAGNOSIS — L89620 Pressure ulcer of left heel, unstageable: Secondary | ICD-10-CM | POA: Diagnosis not present

## 2022-07-29 DIAGNOSIS — Z9181 History of falling: Secondary | ICD-10-CM | POA: Diagnosis not present

## 2022-07-29 DIAGNOSIS — F02811 Dementia in other diseases classified elsewhere, unspecified severity, with agitation: Secondary | ICD-10-CM | POA: Diagnosis not present

## 2022-08-05 DIAGNOSIS — G3183 Dementia with Lewy bodies: Secondary | ICD-10-CM | POA: Diagnosis not present

## 2022-08-05 DIAGNOSIS — Z8781 Personal history of (healed) traumatic fracture: Secondary | ICD-10-CM | POA: Diagnosis not present

## 2022-08-05 DIAGNOSIS — F0282 Dementia in other diseases classified elsewhere, unspecified severity, with psychotic disturbance: Secondary | ICD-10-CM | POA: Diagnosis not present

## 2022-08-05 DIAGNOSIS — L89620 Pressure ulcer of left heel, unstageable: Secondary | ICD-10-CM | POA: Diagnosis not present

## 2022-08-05 DIAGNOSIS — Z9181 History of falling: Secondary | ICD-10-CM | POA: Diagnosis not present

## 2022-08-05 DIAGNOSIS — F02811 Dementia in other diseases classified elsewhere, unspecified severity, with agitation: Secondary | ICD-10-CM | POA: Diagnosis not present

## 2022-08-12 DIAGNOSIS — Z8781 Personal history of (healed) traumatic fracture: Secondary | ICD-10-CM | POA: Diagnosis not present

## 2022-08-12 DIAGNOSIS — F02811 Dementia in other diseases classified elsewhere, unspecified severity, with agitation: Secondary | ICD-10-CM | POA: Diagnosis not present

## 2022-08-12 DIAGNOSIS — L89620 Pressure ulcer of left heel, unstageable: Secondary | ICD-10-CM | POA: Diagnosis not present

## 2022-08-12 DIAGNOSIS — F0282 Dementia in other diseases classified elsewhere, unspecified severity, with psychotic disturbance: Secondary | ICD-10-CM | POA: Diagnosis not present

## 2022-08-12 DIAGNOSIS — Z9181 History of falling: Secondary | ICD-10-CM | POA: Diagnosis not present

## 2022-08-12 DIAGNOSIS — G3183 Dementia with Lewy bodies: Secondary | ICD-10-CM | POA: Diagnosis not present

## 2022-08-14 DIAGNOSIS — I480 Paroxysmal atrial fibrillation: Secondary | ICD-10-CM | POA: Diagnosis not present

## 2022-08-14 DIAGNOSIS — F02811 Dementia in other diseases classified elsewhere, unspecified severity, with agitation: Secondary | ICD-10-CM | POA: Diagnosis not present

## 2022-08-14 DIAGNOSIS — I2510 Atherosclerotic heart disease of native coronary artery without angina pectoris: Secondary | ICD-10-CM | POA: Diagnosis not present

## 2022-08-14 DIAGNOSIS — F0282 Dementia in other diseases classified elsewhere, unspecified severity, with psychotic disturbance: Secondary | ICD-10-CM | POA: Diagnosis not present

## 2022-08-14 DIAGNOSIS — L723 Sebaceous cyst: Secondary | ICD-10-CM | POA: Diagnosis not present

## 2022-08-14 DIAGNOSIS — I252 Old myocardial infarction: Secondary | ICD-10-CM | POA: Diagnosis not present

## 2022-08-14 DIAGNOSIS — G4733 Obstructive sleep apnea (adult) (pediatric): Secondary | ICD-10-CM | POA: Diagnosis not present

## 2022-08-14 DIAGNOSIS — E440 Moderate protein-calorie malnutrition: Secondary | ICD-10-CM | POA: Diagnosis not present

## 2022-08-14 DIAGNOSIS — Z9181 History of falling: Secondary | ICD-10-CM | POA: Diagnosis not present

## 2022-08-14 DIAGNOSIS — L89610 Pressure ulcer of right heel, unstageable: Secondary | ICD-10-CM | POA: Diagnosis not present

## 2022-08-14 DIAGNOSIS — E119 Type 2 diabetes mellitus without complications: Secondary | ICD-10-CM | POA: Diagnosis not present

## 2022-08-14 DIAGNOSIS — Z96642 Presence of left artificial hip joint: Secondary | ICD-10-CM | POA: Diagnosis not present

## 2022-08-14 DIAGNOSIS — J302 Other seasonal allergic rhinitis: Secondary | ICD-10-CM | POA: Diagnosis not present

## 2022-08-14 DIAGNOSIS — R1310 Dysphagia, unspecified: Secondary | ICD-10-CM | POA: Diagnosis not present

## 2022-08-14 DIAGNOSIS — N400 Enlarged prostate without lower urinary tract symptoms: Secondary | ICD-10-CM | POA: Diagnosis not present

## 2022-08-14 DIAGNOSIS — L89620 Pressure ulcer of left heel, unstageable: Secondary | ICD-10-CM | POA: Diagnosis not present

## 2022-08-14 DIAGNOSIS — M1990 Unspecified osteoarthritis, unspecified site: Secondary | ICD-10-CM | POA: Diagnosis not present

## 2022-08-14 DIAGNOSIS — Z8781 Personal history of (healed) traumatic fracture: Secondary | ICD-10-CM | POA: Diagnosis not present

## 2022-08-14 DIAGNOSIS — K219 Gastro-esophageal reflux disease without esophagitis: Secondary | ICD-10-CM | POA: Diagnosis not present

## 2022-08-14 DIAGNOSIS — M109 Gout, unspecified: Secondary | ICD-10-CM | POA: Diagnosis not present

## 2022-08-14 DIAGNOSIS — Z96641 Presence of right artificial hip joint: Secondary | ICD-10-CM | POA: Diagnosis not present

## 2022-08-14 DIAGNOSIS — I10 Essential (primary) hypertension: Secondary | ICD-10-CM | POA: Diagnosis not present

## 2022-08-14 DIAGNOSIS — Z8701 Personal history of pneumonia (recurrent): Secondary | ICD-10-CM | POA: Diagnosis not present

## 2022-08-14 DIAGNOSIS — K222 Esophageal obstruction: Secondary | ICD-10-CM | POA: Diagnosis not present

## 2022-08-14 DIAGNOSIS — G3183 Dementia with Lewy bodies: Secondary | ICD-10-CM | POA: Diagnosis not present

## 2022-08-19 DIAGNOSIS — F0282 Dementia in other diseases classified elsewhere, unspecified severity, with psychotic disturbance: Secondary | ICD-10-CM | POA: Diagnosis not present

## 2022-08-19 DIAGNOSIS — G3183 Dementia with Lewy bodies: Secondary | ICD-10-CM | POA: Diagnosis not present

## 2022-08-19 DIAGNOSIS — F02811 Dementia in other diseases classified elsewhere, unspecified severity, with agitation: Secondary | ICD-10-CM | POA: Diagnosis not present

## 2022-08-19 DIAGNOSIS — Z9181 History of falling: Secondary | ICD-10-CM | POA: Diagnosis not present

## 2022-08-19 DIAGNOSIS — L89620 Pressure ulcer of left heel, unstageable: Secondary | ICD-10-CM | POA: Diagnosis not present

## 2022-08-19 DIAGNOSIS — Z8781 Personal history of (healed) traumatic fracture: Secondary | ICD-10-CM | POA: Diagnosis not present

## 2022-08-28 DIAGNOSIS — F0282 Dementia in other diseases classified elsewhere, unspecified severity, with psychotic disturbance: Secondary | ICD-10-CM | POA: Diagnosis not present

## 2022-08-28 DIAGNOSIS — G3183 Dementia with Lewy bodies: Secondary | ICD-10-CM | POA: Diagnosis not present

## 2022-08-28 DIAGNOSIS — Z8781 Personal history of (healed) traumatic fracture: Secondary | ICD-10-CM | POA: Diagnosis not present

## 2022-08-28 DIAGNOSIS — L89620 Pressure ulcer of left heel, unstageable: Secondary | ICD-10-CM | POA: Diagnosis not present

## 2022-08-28 DIAGNOSIS — Z9181 History of falling: Secondary | ICD-10-CM | POA: Diagnosis not present

## 2022-08-28 DIAGNOSIS — F02811 Dementia in other diseases classified elsewhere, unspecified severity, with agitation: Secondary | ICD-10-CM | POA: Diagnosis not present

## 2022-09-02 DIAGNOSIS — F0282 Dementia in other diseases classified elsewhere, unspecified severity, with psychotic disturbance: Secondary | ICD-10-CM | POA: Diagnosis not present

## 2022-09-02 DIAGNOSIS — F02811 Dementia in other diseases classified elsewhere, unspecified severity, with agitation: Secondary | ICD-10-CM | POA: Diagnosis not present

## 2022-09-02 DIAGNOSIS — Z9181 History of falling: Secondary | ICD-10-CM | POA: Diagnosis not present

## 2022-09-02 DIAGNOSIS — L89620 Pressure ulcer of left heel, unstageable: Secondary | ICD-10-CM | POA: Diagnosis not present

## 2022-09-02 DIAGNOSIS — G3183 Dementia with Lewy bodies: Secondary | ICD-10-CM | POA: Diagnosis not present

## 2022-09-02 DIAGNOSIS — Z8781 Personal history of (healed) traumatic fracture: Secondary | ICD-10-CM | POA: Diagnosis not present

## 2022-09-09 ENCOUNTER — Ambulatory Visit: Payer: Medicare Other | Admitting: Podiatry

## 2022-09-11 ENCOUNTER — Ambulatory Visit: Payer: Medicare Other | Admitting: Podiatry

## 2022-09-11 DIAGNOSIS — L89620 Pressure ulcer of left heel, unstageable: Secondary | ICD-10-CM | POA: Diagnosis not present

## 2022-09-11 DIAGNOSIS — G3183 Dementia with Lewy bodies: Secondary | ICD-10-CM | POA: Diagnosis not present

## 2022-09-11 DIAGNOSIS — Z8781 Personal history of (healed) traumatic fracture: Secondary | ICD-10-CM | POA: Diagnosis not present

## 2022-09-11 DIAGNOSIS — F02811 Dementia in other diseases classified elsewhere, unspecified severity, with agitation: Secondary | ICD-10-CM | POA: Diagnosis not present

## 2022-09-11 DIAGNOSIS — Z9181 History of falling: Secondary | ICD-10-CM | POA: Diagnosis not present

## 2022-09-11 DIAGNOSIS — F0282 Dementia in other diseases classified elsewhere, unspecified severity, with psychotic disturbance: Secondary | ICD-10-CM | POA: Diagnosis not present

## 2022-09-13 DIAGNOSIS — K219 Gastro-esophageal reflux disease without esophagitis: Secondary | ICD-10-CM | POA: Diagnosis not present

## 2022-09-13 DIAGNOSIS — L723 Sebaceous cyst: Secondary | ICD-10-CM | POA: Diagnosis not present

## 2022-09-13 DIAGNOSIS — I10 Essential (primary) hypertension: Secondary | ICD-10-CM | POA: Diagnosis not present

## 2022-09-13 DIAGNOSIS — R1310 Dysphagia, unspecified: Secondary | ICD-10-CM | POA: Diagnosis not present

## 2022-09-13 DIAGNOSIS — G3183 Dementia with Lewy bodies: Secondary | ICD-10-CM | POA: Diagnosis not present

## 2022-09-13 DIAGNOSIS — J302 Other seasonal allergic rhinitis: Secondary | ICD-10-CM | POA: Diagnosis not present

## 2022-09-13 DIAGNOSIS — L89620 Pressure ulcer of left heel, unstageable: Secondary | ICD-10-CM | POA: Diagnosis not present

## 2022-09-13 DIAGNOSIS — K222 Esophageal obstruction: Secondary | ICD-10-CM | POA: Diagnosis not present

## 2022-09-13 DIAGNOSIS — E440 Moderate protein-calorie malnutrition: Secondary | ICD-10-CM | POA: Diagnosis not present

## 2022-09-13 DIAGNOSIS — N400 Enlarged prostate without lower urinary tract symptoms: Secondary | ICD-10-CM | POA: Diagnosis not present

## 2022-09-13 DIAGNOSIS — Z8701 Personal history of pneumonia (recurrent): Secondary | ICD-10-CM | POA: Diagnosis not present

## 2022-09-13 DIAGNOSIS — I480 Paroxysmal atrial fibrillation: Secondary | ICD-10-CM | POA: Diagnosis not present

## 2022-09-13 DIAGNOSIS — I2510 Atherosclerotic heart disease of native coronary artery without angina pectoris: Secondary | ICD-10-CM | POA: Diagnosis not present

## 2022-09-13 DIAGNOSIS — I252 Old myocardial infarction: Secondary | ICD-10-CM | POA: Diagnosis not present

## 2022-09-13 DIAGNOSIS — E119 Type 2 diabetes mellitus without complications: Secondary | ICD-10-CM | POA: Diagnosis not present

## 2022-09-13 DIAGNOSIS — Z9181 History of falling: Secondary | ICD-10-CM | POA: Diagnosis not present

## 2022-09-13 DIAGNOSIS — Z96641 Presence of right artificial hip joint: Secondary | ICD-10-CM | POA: Diagnosis not present

## 2022-09-13 DIAGNOSIS — Z8781 Personal history of (healed) traumatic fracture: Secondary | ICD-10-CM | POA: Diagnosis not present

## 2022-09-13 DIAGNOSIS — F02811 Dementia in other diseases classified elsewhere, unspecified severity, with agitation: Secondary | ICD-10-CM | POA: Diagnosis not present

## 2022-09-13 DIAGNOSIS — M109 Gout, unspecified: Secondary | ICD-10-CM | POA: Diagnosis not present

## 2022-09-13 DIAGNOSIS — M1990 Unspecified osteoarthritis, unspecified site: Secondary | ICD-10-CM | POA: Diagnosis not present

## 2022-09-13 DIAGNOSIS — G4733 Obstructive sleep apnea (adult) (pediatric): Secondary | ICD-10-CM | POA: Diagnosis not present

## 2022-09-13 DIAGNOSIS — L89610 Pressure ulcer of right heel, unstageable: Secondary | ICD-10-CM | POA: Diagnosis not present

## 2022-09-13 DIAGNOSIS — F0282 Dementia in other diseases classified elsewhere, unspecified severity, with psychotic disturbance: Secondary | ICD-10-CM | POA: Diagnosis not present

## 2022-09-13 DIAGNOSIS — Z96642 Presence of left artificial hip joint: Secondary | ICD-10-CM | POA: Diagnosis not present

## 2022-09-18 DIAGNOSIS — Z8781 Personal history of (healed) traumatic fracture: Secondary | ICD-10-CM | POA: Diagnosis not present

## 2022-09-18 DIAGNOSIS — F02811 Dementia in other diseases classified elsewhere, unspecified severity, with agitation: Secondary | ICD-10-CM | POA: Diagnosis not present

## 2022-09-18 DIAGNOSIS — L89620 Pressure ulcer of left heel, unstageable: Secondary | ICD-10-CM | POA: Diagnosis not present

## 2022-09-18 DIAGNOSIS — Z9181 History of falling: Secondary | ICD-10-CM | POA: Diagnosis not present

## 2022-09-18 DIAGNOSIS — F0282 Dementia in other diseases classified elsewhere, unspecified severity, with psychotic disturbance: Secondary | ICD-10-CM | POA: Diagnosis not present

## 2022-09-18 DIAGNOSIS — G3183 Dementia with Lewy bodies: Secondary | ICD-10-CM | POA: Diagnosis not present

## 2022-09-25 DIAGNOSIS — L89620 Pressure ulcer of left heel, unstageable: Secondary | ICD-10-CM | POA: Diagnosis not present

## 2022-09-25 DIAGNOSIS — G3183 Dementia with Lewy bodies: Secondary | ICD-10-CM | POA: Diagnosis not present

## 2022-09-25 DIAGNOSIS — F02811 Dementia in other diseases classified elsewhere, unspecified severity, with agitation: Secondary | ICD-10-CM | POA: Diagnosis not present

## 2022-09-25 DIAGNOSIS — Z8781 Personal history of (healed) traumatic fracture: Secondary | ICD-10-CM | POA: Diagnosis not present

## 2022-09-25 DIAGNOSIS — F0282 Dementia in other diseases classified elsewhere, unspecified severity, with psychotic disturbance: Secondary | ICD-10-CM | POA: Diagnosis not present

## 2022-09-25 DIAGNOSIS — Z9181 History of falling: Secondary | ICD-10-CM | POA: Diagnosis not present

## 2022-10-02 DIAGNOSIS — F02811 Dementia in other diseases classified elsewhere, unspecified severity, with agitation: Secondary | ICD-10-CM | POA: Diagnosis not present

## 2022-10-02 DIAGNOSIS — L89620 Pressure ulcer of left heel, unstageable: Secondary | ICD-10-CM | POA: Diagnosis not present

## 2022-10-02 DIAGNOSIS — Z9181 History of falling: Secondary | ICD-10-CM | POA: Diagnosis not present

## 2022-10-02 DIAGNOSIS — Z8781 Personal history of (healed) traumatic fracture: Secondary | ICD-10-CM | POA: Diagnosis not present

## 2022-10-02 DIAGNOSIS — G3183 Dementia with Lewy bodies: Secondary | ICD-10-CM | POA: Diagnosis not present

## 2022-10-02 DIAGNOSIS — F0282 Dementia in other diseases classified elsewhere, unspecified severity, with psychotic disturbance: Secondary | ICD-10-CM | POA: Diagnosis not present

## 2022-10-08 ENCOUNTER — Ambulatory Visit: Payer: Medicare Other | Admitting: Podiatry

## 2022-10-09 DIAGNOSIS — Z8781 Personal history of (healed) traumatic fracture: Secondary | ICD-10-CM | POA: Diagnosis not present

## 2022-10-09 DIAGNOSIS — F0282 Dementia in other diseases classified elsewhere, unspecified severity, with psychotic disturbance: Secondary | ICD-10-CM | POA: Diagnosis not present

## 2022-10-09 DIAGNOSIS — F02811 Dementia in other diseases classified elsewhere, unspecified severity, with agitation: Secondary | ICD-10-CM | POA: Diagnosis not present

## 2022-10-09 DIAGNOSIS — L89620 Pressure ulcer of left heel, unstageable: Secondary | ICD-10-CM | POA: Diagnosis not present

## 2022-10-09 DIAGNOSIS — Z9181 History of falling: Secondary | ICD-10-CM | POA: Diagnosis not present

## 2022-10-09 DIAGNOSIS — G3183 Dementia with Lewy bodies: Secondary | ICD-10-CM | POA: Diagnosis not present

## 2022-10-11 DIAGNOSIS — Z8781 Personal history of (healed) traumatic fracture: Secondary | ICD-10-CM | POA: Diagnosis not present

## 2022-10-11 DIAGNOSIS — G3183 Dementia with Lewy bodies: Secondary | ICD-10-CM | POA: Diagnosis not present

## 2022-10-11 DIAGNOSIS — L89620 Pressure ulcer of left heel, unstageable: Secondary | ICD-10-CM | POA: Diagnosis not present

## 2022-10-11 DIAGNOSIS — F02811 Dementia in other diseases classified elsewhere, unspecified severity, with agitation: Secondary | ICD-10-CM | POA: Diagnosis not present

## 2022-10-11 DIAGNOSIS — F0282 Dementia in other diseases classified elsewhere, unspecified severity, with psychotic disturbance: Secondary | ICD-10-CM | POA: Diagnosis not present

## 2022-10-11 DIAGNOSIS — Z9181 History of falling: Secondary | ICD-10-CM | POA: Diagnosis not present

## 2022-10-14 DIAGNOSIS — I252 Old myocardial infarction: Secondary | ICD-10-CM | POA: Diagnosis not present

## 2022-10-14 DIAGNOSIS — I480 Paroxysmal atrial fibrillation: Secondary | ICD-10-CM | POA: Diagnosis not present

## 2022-10-14 DIAGNOSIS — L89620 Pressure ulcer of left heel, unstageable: Secondary | ICD-10-CM | POA: Diagnosis not present

## 2022-10-14 DIAGNOSIS — K219 Gastro-esophageal reflux disease without esophagitis: Secondary | ICD-10-CM | POA: Diagnosis not present

## 2022-10-14 DIAGNOSIS — E119 Type 2 diabetes mellitus without complications: Secondary | ICD-10-CM | POA: Diagnosis not present

## 2022-10-14 DIAGNOSIS — N400 Enlarged prostate without lower urinary tract symptoms: Secondary | ICD-10-CM | POA: Diagnosis not present

## 2022-10-14 DIAGNOSIS — L89610 Pressure ulcer of right heel, unstageable: Secondary | ICD-10-CM | POA: Diagnosis not present

## 2022-10-14 DIAGNOSIS — I2510 Atherosclerotic heart disease of native coronary artery without angina pectoris: Secondary | ICD-10-CM | POA: Diagnosis not present

## 2022-10-14 DIAGNOSIS — J302 Other seasonal allergic rhinitis: Secondary | ICD-10-CM | POA: Diagnosis not present

## 2022-10-14 DIAGNOSIS — G3183 Dementia with Lewy bodies: Secondary | ICD-10-CM | POA: Diagnosis not present

## 2022-10-14 DIAGNOSIS — I10 Essential (primary) hypertension: Secondary | ICD-10-CM | POA: Diagnosis not present

## 2022-10-14 DIAGNOSIS — R1310 Dysphagia, unspecified: Secondary | ICD-10-CM | POA: Diagnosis not present

## 2022-10-14 DIAGNOSIS — Z96642 Presence of left artificial hip joint: Secondary | ICD-10-CM | POA: Diagnosis not present

## 2022-10-14 DIAGNOSIS — G4733 Obstructive sleep apnea (adult) (pediatric): Secondary | ICD-10-CM | POA: Diagnosis not present

## 2022-10-14 DIAGNOSIS — Z8781 Personal history of (healed) traumatic fracture: Secondary | ICD-10-CM | POA: Diagnosis not present

## 2022-10-14 DIAGNOSIS — M1990 Unspecified osteoarthritis, unspecified site: Secondary | ICD-10-CM | POA: Diagnosis not present

## 2022-10-14 DIAGNOSIS — Z96641 Presence of right artificial hip joint: Secondary | ICD-10-CM | POA: Diagnosis not present

## 2022-10-14 DIAGNOSIS — Z9181 History of falling: Secondary | ICD-10-CM | POA: Diagnosis not present

## 2022-10-14 DIAGNOSIS — F0282 Dementia in other diseases classified elsewhere, unspecified severity, with psychotic disturbance: Secondary | ICD-10-CM | POA: Diagnosis not present

## 2022-10-14 DIAGNOSIS — E440 Moderate protein-calorie malnutrition: Secondary | ICD-10-CM | POA: Diagnosis not present

## 2022-10-14 DIAGNOSIS — F02811 Dementia in other diseases classified elsewhere, unspecified severity, with agitation: Secondary | ICD-10-CM | POA: Diagnosis not present

## 2022-10-14 DIAGNOSIS — K222 Esophageal obstruction: Secondary | ICD-10-CM | POA: Diagnosis not present

## 2022-10-14 DIAGNOSIS — M109 Gout, unspecified: Secondary | ICD-10-CM | POA: Diagnosis not present

## 2022-10-14 DIAGNOSIS — L723 Sebaceous cyst: Secondary | ICD-10-CM | POA: Diagnosis not present

## 2022-10-14 DIAGNOSIS — Z8701 Personal history of pneumonia (recurrent): Secondary | ICD-10-CM | POA: Diagnosis not present

## 2022-10-16 DIAGNOSIS — G3183 Dementia with Lewy bodies: Secondary | ICD-10-CM | POA: Diagnosis not present

## 2022-10-16 DIAGNOSIS — F0282 Dementia in other diseases classified elsewhere, unspecified severity, with psychotic disturbance: Secondary | ICD-10-CM | POA: Diagnosis not present

## 2022-10-16 DIAGNOSIS — L89620 Pressure ulcer of left heel, unstageable: Secondary | ICD-10-CM | POA: Diagnosis not present

## 2022-10-16 DIAGNOSIS — F02811 Dementia in other diseases classified elsewhere, unspecified severity, with agitation: Secondary | ICD-10-CM | POA: Diagnosis not present

## 2022-10-16 DIAGNOSIS — Z8781 Personal history of (healed) traumatic fracture: Secondary | ICD-10-CM | POA: Diagnosis not present

## 2022-10-16 DIAGNOSIS — Z9181 History of falling: Secondary | ICD-10-CM | POA: Diagnosis not present

## 2022-10-23 DIAGNOSIS — Z8781 Personal history of (healed) traumatic fracture: Secondary | ICD-10-CM | POA: Diagnosis not present

## 2022-10-23 DIAGNOSIS — Z9181 History of falling: Secondary | ICD-10-CM | POA: Diagnosis not present

## 2022-10-23 DIAGNOSIS — L89620 Pressure ulcer of left heel, unstageable: Secondary | ICD-10-CM | POA: Diagnosis not present

## 2022-10-23 DIAGNOSIS — G3183 Dementia with Lewy bodies: Secondary | ICD-10-CM | POA: Diagnosis not present

## 2022-10-23 DIAGNOSIS — F02811 Dementia in other diseases classified elsewhere, unspecified severity, with agitation: Secondary | ICD-10-CM | POA: Diagnosis not present

## 2022-10-23 DIAGNOSIS — F0282 Dementia in other diseases classified elsewhere, unspecified severity, with psychotic disturbance: Secondary | ICD-10-CM | POA: Diagnosis not present

## 2022-10-30 DIAGNOSIS — Z8781 Personal history of (healed) traumatic fracture: Secondary | ICD-10-CM | POA: Diagnosis not present

## 2022-10-30 DIAGNOSIS — G3183 Dementia with Lewy bodies: Secondary | ICD-10-CM | POA: Diagnosis not present

## 2022-10-30 DIAGNOSIS — L89620 Pressure ulcer of left heel, unstageable: Secondary | ICD-10-CM | POA: Diagnosis not present

## 2022-10-30 DIAGNOSIS — F0282 Dementia in other diseases classified elsewhere, unspecified severity, with psychotic disturbance: Secondary | ICD-10-CM | POA: Diagnosis not present

## 2022-10-30 DIAGNOSIS — Z9181 History of falling: Secondary | ICD-10-CM | POA: Diagnosis not present

## 2022-10-30 DIAGNOSIS — F02811 Dementia in other diseases classified elsewhere, unspecified severity, with agitation: Secondary | ICD-10-CM | POA: Diagnosis not present

## 2022-11-06 DIAGNOSIS — L89620 Pressure ulcer of left heel, unstageable: Secondary | ICD-10-CM | POA: Diagnosis not present

## 2022-11-06 DIAGNOSIS — Z8781 Personal history of (healed) traumatic fracture: Secondary | ICD-10-CM | POA: Diagnosis not present

## 2022-11-06 DIAGNOSIS — G3183 Dementia with Lewy bodies: Secondary | ICD-10-CM | POA: Diagnosis not present

## 2022-11-06 DIAGNOSIS — F0282 Dementia in other diseases classified elsewhere, unspecified severity, with psychotic disturbance: Secondary | ICD-10-CM | POA: Diagnosis not present

## 2022-11-06 DIAGNOSIS — F02811 Dementia in other diseases classified elsewhere, unspecified severity, with agitation: Secondary | ICD-10-CM | POA: Diagnosis not present

## 2022-11-06 DIAGNOSIS — Z9181 History of falling: Secondary | ICD-10-CM | POA: Diagnosis not present

## 2022-11-13 DIAGNOSIS — Z9181 History of falling: Secondary | ICD-10-CM | POA: Diagnosis not present

## 2022-11-13 DIAGNOSIS — Z8781 Personal history of (healed) traumatic fracture: Secondary | ICD-10-CM | POA: Diagnosis not present

## 2022-11-13 DIAGNOSIS — F02811 Dementia in other diseases classified elsewhere, unspecified severity, with agitation: Secondary | ICD-10-CM | POA: Diagnosis not present

## 2022-11-13 DIAGNOSIS — L89620 Pressure ulcer of left heel, unstageable: Secondary | ICD-10-CM | POA: Diagnosis not present

## 2022-11-13 DIAGNOSIS — F0282 Dementia in other diseases classified elsewhere, unspecified severity, with psychotic disturbance: Secondary | ICD-10-CM | POA: Diagnosis not present

## 2022-11-13 DIAGNOSIS — G3183 Dementia with Lewy bodies: Secondary | ICD-10-CM | POA: Diagnosis not present

## 2022-11-14 DIAGNOSIS — N400 Enlarged prostate without lower urinary tract symptoms: Secondary | ICD-10-CM | POA: Diagnosis not present

## 2022-11-14 DIAGNOSIS — M1990 Unspecified osteoarthritis, unspecified site: Secondary | ICD-10-CM | POA: Diagnosis not present

## 2022-11-14 DIAGNOSIS — L89610 Pressure ulcer of right heel, unstageable: Secondary | ICD-10-CM | POA: Diagnosis not present

## 2022-11-14 DIAGNOSIS — G4733 Obstructive sleep apnea (adult) (pediatric): Secondary | ICD-10-CM | POA: Diagnosis not present

## 2022-11-14 DIAGNOSIS — Z8701 Personal history of pneumonia (recurrent): Secondary | ICD-10-CM | POA: Diagnosis not present

## 2022-11-14 DIAGNOSIS — Z96642 Presence of left artificial hip joint: Secondary | ICD-10-CM | POA: Diagnosis not present

## 2022-11-14 DIAGNOSIS — L723 Sebaceous cyst: Secondary | ICD-10-CM | POA: Diagnosis not present

## 2022-11-14 DIAGNOSIS — J302 Other seasonal allergic rhinitis: Secondary | ICD-10-CM | POA: Diagnosis not present

## 2022-11-14 DIAGNOSIS — I252 Old myocardial infarction: Secondary | ICD-10-CM | POA: Diagnosis not present

## 2022-11-14 DIAGNOSIS — E119 Type 2 diabetes mellitus without complications: Secondary | ICD-10-CM | POA: Diagnosis not present

## 2022-11-14 DIAGNOSIS — G3183 Dementia with Lewy bodies: Secondary | ICD-10-CM | POA: Diagnosis not present

## 2022-11-14 DIAGNOSIS — R1310 Dysphagia, unspecified: Secondary | ICD-10-CM | POA: Diagnosis not present

## 2022-11-14 DIAGNOSIS — Z8781 Personal history of (healed) traumatic fracture: Secondary | ICD-10-CM | POA: Diagnosis not present

## 2022-11-14 DIAGNOSIS — F0282 Dementia in other diseases classified elsewhere, unspecified severity, with psychotic disturbance: Secondary | ICD-10-CM | POA: Diagnosis not present

## 2022-11-14 DIAGNOSIS — I480 Paroxysmal atrial fibrillation: Secondary | ICD-10-CM | POA: Diagnosis not present

## 2022-11-14 DIAGNOSIS — I10 Essential (primary) hypertension: Secondary | ICD-10-CM | POA: Diagnosis not present

## 2022-11-14 DIAGNOSIS — F02811 Dementia in other diseases classified elsewhere, unspecified severity, with agitation: Secondary | ICD-10-CM | POA: Diagnosis not present

## 2022-11-14 DIAGNOSIS — M109 Gout, unspecified: Secondary | ICD-10-CM | POA: Diagnosis not present

## 2022-11-14 DIAGNOSIS — K222 Esophageal obstruction: Secondary | ICD-10-CM | POA: Diagnosis not present

## 2022-11-14 DIAGNOSIS — L89620 Pressure ulcer of left heel, unstageable: Secondary | ICD-10-CM | POA: Diagnosis not present

## 2022-11-14 DIAGNOSIS — Z9181 History of falling: Secondary | ICD-10-CM | POA: Diagnosis not present

## 2022-11-14 DIAGNOSIS — Z96641 Presence of right artificial hip joint: Secondary | ICD-10-CM | POA: Diagnosis not present

## 2022-11-14 DIAGNOSIS — I2510 Atherosclerotic heart disease of native coronary artery without angina pectoris: Secondary | ICD-10-CM | POA: Diagnosis not present

## 2022-11-14 DIAGNOSIS — E440 Moderate protein-calorie malnutrition: Secondary | ICD-10-CM | POA: Diagnosis not present

## 2022-11-14 DIAGNOSIS — K219 Gastro-esophageal reflux disease without esophagitis: Secondary | ICD-10-CM | POA: Diagnosis not present

## 2022-11-20 DIAGNOSIS — G3183 Dementia with Lewy bodies: Secondary | ICD-10-CM | POA: Diagnosis not present

## 2022-11-20 DIAGNOSIS — Z8781 Personal history of (healed) traumatic fracture: Secondary | ICD-10-CM | POA: Diagnosis not present

## 2022-11-20 DIAGNOSIS — F0282 Dementia in other diseases classified elsewhere, unspecified severity, with psychotic disturbance: Secondary | ICD-10-CM | POA: Diagnosis not present

## 2022-11-20 DIAGNOSIS — L89620 Pressure ulcer of left heel, unstageable: Secondary | ICD-10-CM | POA: Diagnosis not present

## 2022-11-20 DIAGNOSIS — Z9181 History of falling: Secondary | ICD-10-CM | POA: Diagnosis not present

## 2022-11-20 DIAGNOSIS — F02811 Dementia in other diseases classified elsewhere, unspecified severity, with agitation: Secondary | ICD-10-CM | POA: Diagnosis not present

## 2022-11-27 DIAGNOSIS — F0282 Dementia in other diseases classified elsewhere, unspecified severity, with psychotic disturbance: Secondary | ICD-10-CM | POA: Diagnosis not present

## 2022-11-27 DIAGNOSIS — G3183 Dementia with Lewy bodies: Secondary | ICD-10-CM | POA: Diagnosis not present

## 2022-11-27 DIAGNOSIS — Z9181 History of falling: Secondary | ICD-10-CM | POA: Diagnosis not present

## 2022-11-27 DIAGNOSIS — Z8781 Personal history of (healed) traumatic fracture: Secondary | ICD-10-CM | POA: Diagnosis not present

## 2022-11-27 DIAGNOSIS — F02811 Dementia in other diseases classified elsewhere, unspecified severity, with agitation: Secondary | ICD-10-CM | POA: Diagnosis not present

## 2022-11-27 DIAGNOSIS — L89620 Pressure ulcer of left heel, unstageable: Secondary | ICD-10-CM | POA: Diagnosis not present

## 2022-12-03 DIAGNOSIS — Z9181 History of falling: Secondary | ICD-10-CM | POA: Diagnosis not present

## 2022-12-03 DIAGNOSIS — Z8781 Personal history of (healed) traumatic fracture: Secondary | ICD-10-CM | POA: Diagnosis not present

## 2022-12-03 DIAGNOSIS — L89620 Pressure ulcer of left heel, unstageable: Secondary | ICD-10-CM | POA: Diagnosis not present

## 2022-12-03 DIAGNOSIS — F02811 Dementia in other diseases classified elsewhere, unspecified severity, with agitation: Secondary | ICD-10-CM | POA: Diagnosis not present

## 2022-12-03 DIAGNOSIS — F0282 Dementia in other diseases classified elsewhere, unspecified severity, with psychotic disturbance: Secondary | ICD-10-CM | POA: Diagnosis not present

## 2022-12-03 DIAGNOSIS — G3183 Dementia with Lewy bodies: Secondary | ICD-10-CM | POA: Diagnosis not present

## 2022-12-11 DIAGNOSIS — Z9181 History of falling: Secondary | ICD-10-CM | POA: Diagnosis not present

## 2022-12-11 DIAGNOSIS — G3183 Dementia with Lewy bodies: Secondary | ICD-10-CM | POA: Diagnosis not present

## 2022-12-11 DIAGNOSIS — Z8781 Personal history of (healed) traumatic fracture: Secondary | ICD-10-CM | POA: Diagnosis not present

## 2022-12-11 DIAGNOSIS — L89620 Pressure ulcer of left heel, unstageable: Secondary | ICD-10-CM | POA: Diagnosis not present

## 2022-12-11 DIAGNOSIS — F0282 Dementia in other diseases classified elsewhere, unspecified severity, with psychotic disturbance: Secondary | ICD-10-CM | POA: Diagnosis not present

## 2022-12-11 DIAGNOSIS — F02811 Dementia in other diseases classified elsewhere, unspecified severity, with agitation: Secondary | ICD-10-CM | POA: Diagnosis not present

## 2022-12-12 DIAGNOSIS — Z8781 Personal history of (healed) traumatic fracture: Secondary | ICD-10-CM | POA: Diagnosis not present

## 2022-12-12 DIAGNOSIS — F0282 Dementia in other diseases classified elsewhere, unspecified severity, with psychotic disturbance: Secondary | ICD-10-CM | POA: Diagnosis not present

## 2022-12-12 DIAGNOSIS — L89620 Pressure ulcer of left heel, unstageable: Secondary | ICD-10-CM | POA: Diagnosis not present

## 2022-12-12 DIAGNOSIS — F02811 Dementia in other diseases classified elsewhere, unspecified severity, with agitation: Secondary | ICD-10-CM | POA: Diagnosis not present

## 2022-12-12 DIAGNOSIS — G3183 Dementia with Lewy bodies: Secondary | ICD-10-CM | POA: Diagnosis not present

## 2022-12-12 DIAGNOSIS — Z9181 History of falling: Secondary | ICD-10-CM | POA: Diagnosis not present

## 2022-12-13 DIAGNOSIS — L89610 Pressure ulcer of right heel, unstageable: Secondary | ICD-10-CM | POA: Diagnosis not present

## 2022-12-13 DIAGNOSIS — K219 Gastro-esophageal reflux disease without esophagitis: Secondary | ICD-10-CM | POA: Diagnosis not present

## 2022-12-13 DIAGNOSIS — I480 Paroxysmal atrial fibrillation: Secondary | ICD-10-CM | POA: Diagnosis not present

## 2022-12-13 DIAGNOSIS — Z96641 Presence of right artificial hip joint: Secondary | ICD-10-CM | POA: Diagnosis not present

## 2022-12-13 DIAGNOSIS — E440 Moderate protein-calorie malnutrition: Secondary | ICD-10-CM | POA: Diagnosis not present

## 2022-12-13 DIAGNOSIS — M109 Gout, unspecified: Secondary | ICD-10-CM | POA: Diagnosis not present

## 2022-12-13 DIAGNOSIS — L723 Sebaceous cyst: Secondary | ICD-10-CM | POA: Diagnosis not present

## 2022-12-13 DIAGNOSIS — F0282 Dementia in other diseases classified elsewhere, unspecified severity, with psychotic disturbance: Secondary | ICD-10-CM | POA: Diagnosis not present

## 2022-12-13 DIAGNOSIS — N400 Enlarged prostate without lower urinary tract symptoms: Secondary | ICD-10-CM | POA: Diagnosis not present

## 2022-12-13 DIAGNOSIS — R1310 Dysphagia, unspecified: Secondary | ICD-10-CM | POA: Diagnosis not present

## 2022-12-13 DIAGNOSIS — K222 Esophageal obstruction: Secondary | ICD-10-CM | POA: Diagnosis not present

## 2022-12-13 DIAGNOSIS — G3183 Dementia with Lewy bodies: Secondary | ICD-10-CM | POA: Diagnosis not present

## 2022-12-13 DIAGNOSIS — I252 Old myocardial infarction: Secondary | ICD-10-CM | POA: Diagnosis not present

## 2022-12-13 DIAGNOSIS — Z8781 Personal history of (healed) traumatic fracture: Secondary | ICD-10-CM | POA: Diagnosis not present

## 2022-12-13 DIAGNOSIS — I2510 Atherosclerotic heart disease of native coronary artery without angina pectoris: Secondary | ICD-10-CM | POA: Diagnosis not present

## 2022-12-13 DIAGNOSIS — G4733 Obstructive sleep apnea (adult) (pediatric): Secondary | ICD-10-CM | POA: Diagnosis not present

## 2022-12-13 DIAGNOSIS — I10 Essential (primary) hypertension: Secondary | ICD-10-CM | POA: Diagnosis not present

## 2022-12-13 DIAGNOSIS — Z9181 History of falling: Secondary | ICD-10-CM | POA: Diagnosis not present

## 2022-12-13 DIAGNOSIS — Z8701 Personal history of pneumonia (recurrent): Secondary | ICD-10-CM | POA: Diagnosis not present

## 2022-12-13 DIAGNOSIS — J302 Other seasonal allergic rhinitis: Secondary | ICD-10-CM | POA: Diagnosis not present

## 2022-12-13 DIAGNOSIS — L89620 Pressure ulcer of left heel, unstageable: Secondary | ICD-10-CM | POA: Diagnosis not present

## 2022-12-13 DIAGNOSIS — E119 Type 2 diabetes mellitus without complications: Secondary | ICD-10-CM | POA: Diagnosis not present

## 2022-12-13 DIAGNOSIS — F02811 Dementia in other diseases classified elsewhere, unspecified severity, with agitation: Secondary | ICD-10-CM | POA: Diagnosis not present

## 2022-12-13 DIAGNOSIS — Z96642 Presence of left artificial hip joint: Secondary | ICD-10-CM | POA: Diagnosis not present

## 2022-12-13 DIAGNOSIS — M1990 Unspecified osteoarthritis, unspecified site: Secondary | ICD-10-CM | POA: Diagnosis not present

## 2022-12-18 DIAGNOSIS — L89620 Pressure ulcer of left heel, unstageable: Secondary | ICD-10-CM | POA: Diagnosis not present

## 2022-12-18 DIAGNOSIS — G3183 Dementia with Lewy bodies: Secondary | ICD-10-CM | POA: Diagnosis not present

## 2022-12-18 DIAGNOSIS — F0282 Dementia in other diseases classified elsewhere, unspecified severity, with psychotic disturbance: Secondary | ICD-10-CM | POA: Diagnosis not present

## 2022-12-18 DIAGNOSIS — Z9181 History of falling: Secondary | ICD-10-CM | POA: Diagnosis not present

## 2022-12-18 DIAGNOSIS — Z8781 Personal history of (healed) traumatic fracture: Secondary | ICD-10-CM | POA: Diagnosis not present

## 2022-12-18 DIAGNOSIS — F02811 Dementia in other diseases classified elsewhere, unspecified severity, with agitation: Secondary | ICD-10-CM | POA: Diagnosis not present

## 2022-12-25 DIAGNOSIS — F02811 Dementia in other diseases classified elsewhere, unspecified severity, with agitation: Secondary | ICD-10-CM | POA: Diagnosis not present

## 2022-12-25 DIAGNOSIS — F0282 Dementia in other diseases classified elsewhere, unspecified severity, with psychotic disturbance: Secondary | ICD-10-CM | POA: Diagnosis not present

## 2022-12-25 DIAGNOSIS — G3183 Dementia with Lewy bodies: Secondary | ICD-10-CM | POA: Diagnosis not present

## 2022-12-25 DIAGNOSIS — Z9181 History of falling: Secondary | ICD-10-CM | POA: Diagnosis not present

## 2022-12-25 DIAGNOSIS — L89620 Pressure ulcer of left heel, unstageable: Secondary | ICD-10-CM | POA: Diagnosis not present

## 2022-12-25 DIAGNOSIS — Z8781 Personal history of (healed) traumatic fracture: Secondary | ICD-10-CM | POA: Diagnosis not present

## 2022-12-31 DIAGNOSIS — Z9181 History of falling: Secondary | ICD-10-CM | POA: Diagnosis not present

## 2022-12-31 DIAGNOSIS — F02811 Dementia in other diseases classified elsewhere, unspecified severity, with agitation: Secondary | ICD-10-CM | POA: Diagnosis not present

## 2022-12-31 DIAGNOSIS — L89620 Pressure ulcer of left heel, unstageable: Secondary | ICD-10-CM | POA: Diagnosis not present

## 2022-12-31 DIAGNOSIS — G3183 Dementia with Lewy bodies: Secondary | ICD-10-CM | POA: Diagnosis not present

## 2022-12-31 DIAGNOSIS — Z8781 Personal history of (healed) traumatic fracture: Secondary | ICD-10-CM | POA: Diagnosis not present

## 2022-12-31 DIAGNOSIS — F0282 Dementia in other diseases classified elsewhere, unspecified severity, with psychotic disturbance: Secondary | ICD-10-CM | POA: Diagnosis not present

## 2023-01-01 DIAGNOSIS — Z8781 Personal history of (healed) traumatic fracture: Secondary | ICD-10-CM | POA: Diagnosis not present

## 2023-01-01 DIAGNOSIS — L89620 Pressure ulcer of left heel, unstageable: Secondary | ICD-10-CM | POA: Diagnosis not present

## 2023-01-01 DIAGNOSIS — F0282 Dementia in other diseases classified elsewhere, unspecified severity, with psychotic disturbance: Secondary | ICD-10-CM | POA: Diagnosis not present

## 2023-01-01 DIAGNOSIS — G3183 Dementia with Lewy bodies: Secondary | ICD-10-CM | POA: Diagnosis not present

## 2023-01-01 DIAGNOSIS — F02811 Dementia in other diseases classified elsewhere, unspecified severity, with agitation: Secondary | ICD-10-CM | POA: Diagnosis not present

## 2023-01-01 DIAGNOSIS — Z9181 History of falling: Secondary | ICD-10-CM | POA: Diagnosis not present

## 2023-01-08 DIAGNOSIS — F0282 Dementia in other diseases classified elsewhere, unspecified severity, with psychotic disturbance: Secondary | ICD-10-CM | POA: Diagnosis not present

## 2023-01-08 DIAGNOSIS — F02811 Dementia in other diseases classified elsewhere, unspecified severity, with agitation: Secondary | ICD-10-CM | POA: Diagnosis not present

## 2023-01-08 DIAGNOSIS — G3183 Dementia with Lewy bodies: Secondary | ICD-10-CM | POA: Diagnosis not present

## 2023-01-08 DIAGNOSIS — Z8781 Personal history of (healed) traumatic fracture: Secondary | ICD-10-CM | POA: Diagnosis not present

## 2023-01-08 DIAGNOSIS — Z9181 History of falling: Secondary | ICD-10-CM | POA: Diagnosis not present

## 2023-01-08 DIAGNOSIS — L89620 Pressure ulcer of left heel, unstageable: Secondary | ICD-10-CM | POA: Diagnosis not present

## 2023-01-10 DIAGNOSIS — F02811 Dementia in other diseases classified elsewhere, unspecified severity, with agitation: Secondary | ICD-10-CM | POA: Diagnosis not present

## 2023-01-10 DIAGNOSIS — L89620 Pressure ulcer of left heel, unstageable: Secondary | ICD-10-CM | POA: Diagnosis not present

## 2023-01-10 DIAGNOSIS — F0282 Dementia in other diseases classified elsewhere, unspecified severity, with psychotic disturbance: Secondary | ICD-10-CM | POA: Diagnosis not present

## 2023-01-10 DIAGNOSIS — Z8781 Personal history of (healed) traumatic fracture: Secondary | ICD-10-CM | POA: Diagnosis not present

## 2023-01-10 DIAGNOSIS — Z9181 History of falling: Secondary | ICD-10-CM | POA: Diagnosis not present

## 2023-01-10 DIAGNOSIS — G3183 Dementia with Lewy bodies: Secondary | ICD-10-CM | POA: Diagnosis not present

## 2023-01-13 DIAGNOSIS — M1990 Unspecified osteoarthritis, unspecified site: Secondary | ICD-10-CM | POA: Diagnosis not present

## 2023-01-13 DIAGNOSIS — K219 Gastro-esophageal reflux disease without esophagitis: Secondary | ICD-10-CM | POA: Diagnosis not present

## 2023-01-13 DIAGNOSIS — E119 Type 2 diabetes mellitus without complications: Secondary | ICD-10-CM | POA: Diagnosis not present

## 2023-01-13 DIAGNOSIS — F0282 Dementia in other diseases classified elsewhere, unspecified severity, with psychotic disturbance: Secondary | ICD-10-CM | POA: Diagnosis not present

## 2023-01-13 DIAGNOSIS — Z9181 History of falling: Secondary | ICD-10-CM | POA: Diagnosis not present

## 2023-01-13 DIAGNOSIS — L723 Sebaceous cyst: Secondary | ICD-10-CM | POA: Diagnosis not present

## 2023-01-13 DIAGNOSIS — I10 Essential (primary) hypertension: Secondary | ICD-10-CM | POA: Diagnosis not present

## 2023-01-13 DIAGNOSIS — J302 Other seasonal allergic rhinitis: Secondary | ICD-10-CM | POA: Diagnosis not present

## 2023-01-13 DIAGNOSIS — I2510 Atherosclerotic heart disease of native coronary artery without angina pectoris: Secondary | ICD-10-CM | POA: Diagnosis not present

## 2023-01-13 DIAGNOSIS — Z8781 Personal history of (healed) traumatic fracture: Secondary | ICD-10-CM | POA: Diagnosis not present

## 2023-01-13 DIAGNOSIS — Z96642 Presence of left artificial hip joint: Secondary | ICD-10-CM | POA: Diagnosis not present

## 2023-01-13 DIAGNOSIS — M109 Gout, unspecified: Secondary | ICD-10-CM | POA: Diagnosis not present

## 2023-01-13 DIAGNOSIS — N400 Enlarged prostate without lower urinary tract symptoms: Secondary | ICD-10-CM | POA: Diagnosis not present

## 2023-01-13 DIAGNOSIS — K222 Esophageal obstruction: Secondary | ICD-10-CM | POA: Diagnosis not present

## 2023-01-13 DIAGNOSIS — L89620 Pressure ulcer of left heel, unstageable: Secondary | ICD-10-CM | POA: Diagnosis not present

## 2023-01-13 DIAGNOSIS — G3183 Dementia with Lewy bodies: Secondary | ICD-10-CM | POA: Diagnosis not present

## 2023-01-13 DIAGNOSIS — L89610 Pressure ulcer of right heel, unstageable: Secondary | ICD-10-CM | POA: Diagnosis not present

## 2023-01-13 DIAGNOSIS — F02811 Dementia in other diseases classified elsewhere, unspecified severity, with agitation: Secondary | ICD-10-CM | POA: Diagnosis not present

## 2023-01-13 DIAGNOSIS — I252 Old myocardial infarction: Secondary | ICD-10-CM | POA: Diagnosis not present

## 2023-01-13 DIAGNOSIS — I480 Paroxysmal atrial fibrillation: Secondary | ICD-10-CM | POA: Diagnosis not present

## 2023-01-13 DIAGNOSIS — Z8701 Personal history of pneumonia (recurrent): Secondary | ICD-10-CM | POA: Diagnosis not present

## 2023-01-13 DIAGNOSIS — Z96641 Presence of right artificial hip joint: Secondary | ICD-10-CM | POA: Diagnosis not present

## 2023-01-13 DIAGNOSIS — R1310 Dysphagia, unspecified: Secondary | ICD-10-CM | POA: Diagnosis not present

## 2023-01-13 DIAGNOSIS — G4733 Obstructive sleep apnea (adult) (pediatric): Secondary | ICD-10-CM | POA: Diagnosis not present

## 2023-01-13 DIAGNOSIS — E440 Moderate protein-calorie malnutrition: Secondary | ICD-10-CM | POA: Diagnosis not present

## 2023-01-14 DIAGNOSIS — G3183 Dementia with Lewy bodies: Secondary | ICD-10-CM | POA: Diagnosis not present

## 2023-01-14 DIAGNOSIS — F02811 Dementia in other diseases classified elsewhere, unspecified severity, with agitation: Secondary | ICD-10-CM | POA: Diagnosis not present

## 2023-01-14 DIAGNOSIS — L89620 Pressure ulcer of left heel, unstageable: Secondary | ICD-10-CM | POA: Diagnosis not present

## 2023-01-14 DIAGNOSIS — Z9181 History of falling: Secondary | ICD-10-CM | POA: Diagnosis not present

## 2023-01-14 DIAGNOSIS — F0282 Dementia in other diseases classified elsewhere, unspecified severity, with psychotic disturbance: Secondary | ICD-10-CM | POA: Diagnosis not present

## 2023-01-14 DIAGNOSIS — Z8781 Personal history of (healed) traumatic fracture: Secondary | ICD-10-CM | POA: Diagnosis not present

## 2023-01-15 DIAGNOSIS — F02811 Dementia in other diseases classified elsewhere, unspecified severity, with agitation: Secondary | ICD-10-CM | POA: Diagnosis not present

## 2023-01-15 DIAGNOSIS — G3183 Dementia with Lewy bodies: Secondary | ICD-10-CM | POA: Diagnosis not present

## 2023-01-15 DIAGNOSIS — L89620 Pressure ulcer of left heel, unstageable: Secondary | ICD-10-CM | POA: Diagnosis not present

## 2023-01-15 DIAGNOSIS — Z9181 History of falling: Secondary | ICD-10-CM | POA: Diagnosis not present

## 2023-01-15 DIAGNOSIS — Z8781 Personal history of (healed) traumatic fracture: Secondary | ICD-10-CM | POA: Diagnosis not present

## 2023-01-15 DIAGNOSIS — F0282 Dementia in other diseases classified elsewhere, unspecified severity, with psychotic disturbance: Secondary | ICD-10-CM | POA: Diagnosis not present

## 2023-01-22 DIAGNOSIS — L89620 Pressure ulcer of left heel, unstageable: Secondary | ICD-10-CM | POA: Diagnosis not present

## 2023-01-22 DIAGNOSIS — Z8781 Personal history of (healed) traumatic fracture: Secondary | ICD-10-CM | POA: Diagnosis not present

## 2023-01-22 DIAGNOSIS — F0282 Dementia in other diseases classified elsewhere, unspecified severity, with psychotic disturbance: Secondary | ICD-10-CM | POA: Diagnosis not present

## 2023-01-22 DIAGNOSIS — F02811 Dementia in other diseases classified elsewhere, unspecified severity, with agitation: Secondary | ICD-10-CM | POA: Diagnosis not present

## 2023-01-22 DIAGNOSIS — Z9181 History of falling: Secondary | ICD-10-CM | POA: Diagnosis not present

## 2023-01-22 DIAGNOSIS — G3183 Dementia with Lewy bodies: Secondary | ICD-10-CM | POA: Diagnosis not present

## 2023-01-29 DIAGNOSIS — G3183 Dementia with Lewy bodies: Secondary | ICD-10-CM | POA: Diagnosis not present

## 2023-01-29 DIAGNOSIS — Z8781 Personal history of (healed) traumatic fracture: Secondary | ICD-10-CM | POA: Diagnosis not present

## 2023-01-29 DIAGNOSIS — F02811 Dementia in other diseases classified elsewhere, unspecified severity, with agitation: Secondary | ICD-10-CM | POA: Diagnosis not present

## 2023-01-29 DIAGNOSIS — L89620 Pressure ulcer of left heel, unstageable: Secondary | ICD-10-CM | POA: Diagnosis not present

## 2023-01-29 DIAGNOSIS — F0282 Dementia in other diseases classified elsewhere, unspecified severity, with psychotic disturbance: Secondary | ICD-10-CM | POA: Diagnosis not present

## 2023-01-29 DIAGNOSIS — Z9181 History of falling: Secondary | ICD-10-CM | POA: Diagnosis not present

## 2023-02-05 DIAGNOSIS — Z9181 History of falling: Secondary | ICD-10-CM | POA: Diagnosis not present

## 2023-02-05 DIAGNOSIS — F02811 Dementia in other diseases classified elsewhere, unspecified severity, with agitation: Secondary | ICD-10-CM | POA: Diagnosis not present

## 2023-02-05 DIAGNOSIS — G3183 Dementia with Lewy bodies: Secondary | ICD-10-CM | POA: Diagnosis not present

## 2023-02-05 DIAGNOSIS — F0282 Dementia in other diseases classified elsewhere, unspecified severity, with psychotic disturbance: Secondary | ICD-10-CM | POA: Diagnosis not present

## 2023-02-05 DIAGNOSIS — Z8781 Personal history of (healed) traumatic fracture: Secondary | ICD-10-CM | POA: Diagnosis not present

## 2023-02-05 DIAGNOSIS — L89620 Pressure ulcer of left heel, unstageable: Secondary | ICD-10-CM | POA: Diagnosis not present

## 2023-02-06 DIAGNOSIS — F02811 Dementia in other diseases classified elsewhere, unspecified severity, with agitation: Secondary | ICD-10-CM | POA: Diagnosis not present

## 2023-02-06 DIAGNOSIS — G3183 Dementia with Lewy bodies: Secondary | ICD-10-CM | POA: Diagnosis not present

## 2023-02-06 DIAGNOSIS — Z8781 Personal history of (healed) traumatic fracture: Secondary | ICD-10-CM | POA: Diagnosis not present

## 2023-02-06 DIAGNOSIS — F0282 Dementia in other diseases classified elsewhere, unspecified severity, with psychotic disturbance: Secondary | ICD-10-CM | POA: Diagnosis not present

## 2023-02-06 DIAGNOSIS — Z9181 History of falling: Secondary | ICD-10-CM | POA: Diagnosis not present

## 2023-02-06 DIAGNOSIS — L89620 Pressure ulcer of left heel, unstageable: Secondary | ICD-10-CM | POA: Diagnosis not present

## 2023-02-12 DIAGNOSIS — I10 Essential (primary) hypertension: Secondary | ICD-10-CM | POA: Diagnosis not present

## 2023-02-12 DIAGNOSIS — F02811 Dementia in other diseases classified elsewhere, unspecified severity, with agitation: Secondary | ICD-10-CM | POA: Diagnosis not present

## 2023-02-12 DIAGNOSIS — M1990 Unspecified osteoarthritis, unspecified site: Secondary | ICD-10-CM | POA: Diagnosis not present

## 2023-02-12 DIAGNOSIS — G3183 Dementia with Lewy bodies: Secondary | ICD-10-CM | POA: Diagnosis not present

## 2023-02-12 DIAGNOSIS — F0282 Dementia in other diseases classified elsewhere, unspecified severity, with psychotic disturbance: Secondary | ICD-10-CM | POA: Diagnosis not present

## 2023-02-12 DIAGNOSIS — K219 Gastro-esophageal reflux disease without esophagitis: Secondary | ICD-10-CM | POA: Diagnosis not present

## 2023-02-12 DIAGNOSIS — Z8701 Personal history of pneumonia (recurrent): Secondary | ICD-10-CM | POA: Diagnosis not present

## 2023-02-12 DIAGNOSIS — Z9181 History of falling: Secondary | ICD-10-CM | POA: Diagnosis not present

## 2023-02-12 DIAGNOSIS — G4733 Obstructive sleep apnea (adult) (pediatric): Secondary | ICD-10-CM | POA: Diagnosis not present

## 2023-02-12 DIAGNOSIS — M109 Gout, unspecified: Secondary | ICD-10-CM | POA: Diagnosis not present

## 2023-02-12 DIAGNOSIS — L723 Sebaceous cyst: Secondary | ICD-10-CM | POA: Diagnosis not present

## 2023-02-12 DIAGNOSIS — I480 Paroxysmal atrial fibrillation: Secondary | ICD-10-CM | POA: Diagnosis not present

## 2023-02-12 DIAGNOSIS — Z96641 Presence of right artificial hip joint: Secondary | ICD-10-CM | POA: Diagnosis not present

## 2023-02-12 DIAGNOSIS — Z96642 Presence of left artificial hip joint: Secondary | ICD-10-CM | POA: Diagnosis not present

## 2023-02-12 DIAGNOSIS — N400 Enlarged prostate without lower urinary tract symptoms: Secondary | ICD-10-CM | POA: Diagnosis not present

## 2023-02-12 DIAGNOSIS — J302 Other seasonal allergic rhinitis: Secondary | ICD-10-CM | POA: Diagnosis not present

## 2023-02-12 DIAGNOSIS — I252 Old myocardial infarction: Secondary | ICD-10-CM | POA: Diagnosis not present

## 2023-02-12 DIAGNOSIS — E440 Moderate protein-calorie malnutrition: Secondary | ICD-10-CM | POA: Diagnosis not present

## 2023-02-12 DIAGNOSIS — E119 Type 2 diabetes mellitus without complications: Secondary | ICD-10-CM | POA: Diagnosis not present

## 2023-02-12 DIAGNOSIS — I2510 Atherosclerotic heart disease of native coronary artery without angina pectoris: Secondary | ICD-10-CM | POA: Diagnosis not present

## 2023-02-12 DIAGNOSIS — K222 Esophageal obstruction: Secondary | ICD-10-CM | POA: Diagnosis not present

## 2023-02-12 DIAGNOSIS — Z8781 Personal history of (healed) traumatic fracture: Secondary | ICD-10-CM | POA: Diagnosis not present

## 2023-02-12 DIAGNOSIS — R1310 Dysphagia, unspecified: Secondary | ICD-10-CM | POA: Diagnosis not present

## 2023-02-19 DIAGNOSIS — G3183 Dementia with Lewy bodies: Secondary | ICD-10-CM | POA: Diagnosis not present

## 2023-02-19 DIAGNOSIS — Z8781 Personal history of (healed) traumatic fracture: Secondary | ICD-10-CM | POA: Diagnosis not present

## 2023-02-19 DIAGNOSIS — L723 Sebaceous cyst: Secondary | ICD-10-CM | POA: Diagnosis not present

## 2023-02-19 DIAGNOSIS — F0282 Dementia in other diseases classified elsewhere, unspecified severity, with psychotic disturbance: Secondary | ICD-10-CM | POA: Diagnosis not present

## 2023-02-19 DIAGNOSIS — F02811 Dementia in other diseases classified elsewhere, unspecified severity, with agitation: Secondary | ICD-10-CM | POA: Diagnosis not present

## 2023-02-19 DIAGNOSIS — Z9181 History of falling: Secondary | ICD-10-CM | POA: Diagnosis not present

## 2023-02-26 DIAGNOSIS — G3183 Dementia with Lewy bodies: Secondary | ICD-10-CM | POA: Diagnosis not present

## 2023-02-26 DIAGNOSIS — F02811 Dementia in other diseases classified elsewhere, unspecified severity, with agitation: Secondary | ICD-10-CM | POA: Diagnosis not present

## 2023-02-26 DIAGNOSIS — Z8781 Personal history of (healed) traumatic fracture: Secondary | ICD-10-CM | POA: Diagnosis not present

## 2023-02-26 DIAGNOSIS — F0282 Dementia in other diseases classified elsewhere, unspecified severity, with psychotic disturbance: Secondary | ICD-10-CM | POA: Diagnosis not present

## 2023-02-26 DIAGNOSIS — L723 Sebaceous cyst: Secondary | ICD-10-CM | POA: Diagnosis not present

## 2023-02-26 DIAGNOSIS — Z9181 History of falling: Secondary | ICD-10-CM | POA: Diagnosis not present

## 2023-02-28 DIAGNOSIS — F0282 Dementia in other diseases classified elsewhere, unspecified severity, with psychotic disturbance: Secondary | ICD-10-CM | POA: Diagnosis not present

## 2023-02-28 DIAGNOSIS — L723 Sebaceous cyst: Secondary | ICD-10-CM | POA: Diagnosis not present

## 2023-02-28 DIAGNOSIS — F02811 Dementia in other diseases classified elsewhere, unspecified severity, with agitation: Secondary | ICD-10-CM | POA: Diagnosis not present

## 2023-02-28 DIAGNOSIS — Z9181 History of falling: Secondary | ICD-10-CM | POA: Diagnosis not present

## 2023-02-28 DIAGNOSIS — G3183 Dementia with Lewy bodies: Secondary | ICD-10-CM | POA: Diagnosis not present

## 2023-02-28 DIAGNOSIS — Z8781 Personal history of (healed) traumatic fracture: Secondary | ICD-10-CM | POA: Diagnosis not present

## 2023-03-05 DIAGNOSIS — Z8781 Personal history of (healed) traumatic fracture: Secondary | ICD-10-CM | POA: Diagnosis not present

## 2023-03-05 DIAGNOSIS — L723 Sebaceous cyst: Secondary | ICD-10-CM | POA: Diagnosis not present

## 2023-03-05 DIAGNOSIS — F02811 Dementia in other diseases classified elsewhere, unspecified severity, with agitation: Secondary | ICD-10-CM | POA: Diagnosis not present

## 2023-03-05 DIAGNOSIS — F0282 Dementia in other diseases classified elsewhere, unspecified severity, with psychotic disturbance: Secondary | ICD-10-CM | POA: Diagnosis not present

## 2023-03-05 DIAGNOSIS — G3183 Dementia with Lewy bodies: Secondary | ICD-10-CM | POA: Diagnosis not present

## 2023-03-05 DIAGNOSIS — Z9181 History of falling: Secondary | ICD-10-CM | POA: Diagnosis not present

## 2023-03-12 DIAGNOSIS — F0282 Dementia in other diseases classified elsewhere, unspecified severity, with psychotic disturbance: Secondary | ICD-10-CM | POA: Diagnosis not present

## 2023-03-12 DIAGNOSIS — F02811 Dementia in other diseases classified elsewhere, unspecified severity, with agitation: Secondary | ICD-10-CM | POA: Diagnosis not present

## 2023-03-12 DIAGNOSIS — L723 Sebaceous cyst: Secondary | ICD-10-CM | POA: Diagnosis not present

## 2023-03-12 DIAGNOSIS — G3183 Dementia with Lewy bodies: Secondary | ICD-10-CM | POA: Diagnosis not present

## 2023-03-12 DIAGNOSIS — Z8781 Personal history of (healed) traumatic fracture: Secondary | ICD-10-CM | POA: Diagnosis not present

## 2023-03-12 DIAGNOSIS — Z9181 History of falling: Secondary | ICD-10-CM | POA: Diagnosis not present

## 2023-03-13 DIAGNOSIS — G3183 Dementia with Lewy bodies: Secondary | ICD-10-CM | POA: Diagnosis not present

## 2023-03-13 DIAGNOSIS — L723 Sebaceous cyst: Secondary | ICD-10-CM | POA: Diagnosis not present

## 2023-03-13 DIAGNOSIS — F02811 Dementia in other diseases classified elsewhere, unspecified severity, with agitation: Secondary | ICD-10-CM | POA: Diagnosis not present

## 2023-03-13 DIAGNOSIS — Z8781 Personal history of (healed) traumatic fracture: Secondary | ICD-10-CM | POA: Diagnosis not present

## 2023-03-13 DIAGNOSIS — F0282 Dementia in other diseases classified elsewhere, unspecified severity, with psychotic disturbance: Secondary | ICD-10-CM | POA: Diagnosis not present

## 2023-03-13 DIAGNOSIS — Z9181 History of falling: Secondary | ICD-10-CM | POA: Diagnosis not present

## 2023-03-15 DIAGNOSIS — Z96641 Presence of right artificial hip joint: Secondary | ICD-10-CM | POA: Diagnosis not present

## 2023-03-15 DIAGNOSIS — E440 Moderate protein-calorie malnutrition: Secondary | ICD-10-CM | POA: Diagnosis not present

## 2023-03-15 DIAGNOSIS — G4733 Obstructive sleep apnea (adult) (pediatric): Secondary | ICD-10-CM | POA: Diagnosis not present

## 2023-03-15 DIAGNOSIS — Z9181 History of falling: Secondary | ICD-10-CM | POA: Diagnosis not present

## 2023-03-15 DIAGNOSIS — E119 Type 2 diabetes mellitus without complications: Secondary | ICD-10-CM | POA: Diagnosis not present

## 2023-03-15 DIAGNOSIS — J302 Other seasonal allergic rhinitis: Secondary | ICD-10-CM | POA: Diagnosis not present

## 2023-03-15 DIAGNOSIS — R1310 Dysphagia, unspecified: Secondary | ICD-10-CM | POA: Diagnosis not present

## 2023-03-15 DIAGNOSIS — N400 Enlarged prostate without lower urinary tract symptoms: Secondary | ICD-10-CM | POA: Diagnosis not present

## 2023-03-15 DIAGNOSIS — I252 Old myocardial infarction: Secondary | ICD-10-CM | POA: Diagnosis not present

## 2023-03-15 DIAGNOSIS — F02811 Dementia in other diseases classified elsewhere, unspecified severity, with agitation: Secondary | ICD-10-CM | POA: Diagnosis not present

## 2023-03-15 DIAGNOSIS — M109 Gout, unspecified: Secondary | ICD-10-CM | POA: Diagnosis not present

## 2023-03-15 DIAGNOSIS — F0282 Dementia in other diseases classified elsewhere, unspecified severity, with psychotic disturbance: Secondary | ICD-10-CM | POA: Diagnosis not present

## 2023-03-15 DIAGNOSIS — M1990 Unspecified osteoarthritis, unspecified site: Secondary | ICD-10-CM | POA: Diagnosis not present

## 2023-03-15 DIAGNOSIS — I10 Essential (primary) hypertension: Secondary | ICD-10-CM | POA: Diagnosis not present

## 2023-03-15 DIAGNOSIS — Z8701 Personal history of pneumonia (recurrent): Secondary | ICD-10-CM | POA: Diagnosis not present

## 2023-03-15 DIAGNOSIS — I2510 Atherosclerotic heart disease of native coronary artery without angina pectoris: Secondary | ICD-10-CM | POA: Diagnosis not present

## 2023-03-15 DIAGNOSIS — G3183 Dementia with Lewy bodies: Secondary | ICD-10-CM | POA: Diagnosis not present

## 2023-03-15 DIAGNOSIS — K222 Esophageal obstruction: Secondary | ICD-10-CM | POA: Diagnosis not present

## 2023-03-15 DIAGNOSIS — L723 Sebaceous cyst: Secondary | ICD-10-CM | POA: Diagnosis not present

## 2023-03-15 DIAGNOSIS — K219 Gastro-esophageal reflux disease without esophagitis: Secondary | ICD-10-CM | POA: Diagnosis not present

## 2023-03-15 DIAGNOSIS — Z96642 Presence of left artificial hip joint: Secondary | ICD-10-CM | POA: Diagnosis not present

## 2023-03-15 DIAGNOSIS — I480 Paroxysmal atrial fibrillation: Secondary | ICD-10-CM | POA: Diagnosis not present

## 2023-03-15 DIAGNOSIS — Z8781 Personal history of (healed) traumatic fracture: Secondary | ICD-10-CM | POA: Diagnosis not present

## 2023-03-19 DIAGNOSIS — G3183 Dementia with Lewy bodies: Secondary | ICD-10-CM | POA: Diagnosis not present

## 2023-03-19 DIAGNOSIS — Z9181 History of falling: Secondary | ICD-10-CM | POA: Diagnosis not present

## 2023-03-19 DIAGNOSIS — Z8781 Personal history of (healed) traumatic fracture: Secondary | ICD-10-CM | POA: Diagnosis not present

## 2023-03-19 DIAGNOSIS — F0282 Dementia in other diseases classified elsewhere, unspecified severity, with psychotic disturbance: Secondary | ICD-10-CM | POA: Diagnosis not present

## 2023-03-19 DIAGNOSIS — F02811 Dementia in other diseases classified elsewhere, unspecified severity, with agitation: Secondary | ICD-10-CM | POA: Diagnosis not present

## 2023-03-19 DIAGNOSIS — L723 Sebaceous cyst: Secondary | ICD-10-CM | POA: Diagnosis not present

## 2023-03-21 DIAGNOSIS — Z9181 History of falling: Secondary | ICD-10-CM | POA: Diagnosis not present

## 2023-03-21 DIAGNOSIS — Z8781 Personal history of (healed) traumatic fracture: Secondary | ICD-10-CM | POA: Diagnosis not present

## 2023-03-21 DIAGNOSIS — F0282 Dementia in other diseases classified elsewhere, unspecified severity, with psychotic disturbance: Secondary | ICD-10-CM | POA: Diagnosis not present

## 2023-03-21 DIAGNOSIS — L723 Sebaceous cyst: Secondary | ICD-10-CM | POA: Diagnosis not present

## 2023-03-21 DIAGNOSIS — F02811 Dementia in other diseases classified elsewhere, unspecified severity, with agitation: Secondary | ICD-10-CM | POA: Diagnosis not present

## 2023-03-21 DIAGNOSIS — G3183 Dementia with Lewy bodies: Secondary | ICD-10-CM | POA: Diagnosis not present

## 2023-03-26 DIAGNOSIS — F0282 Dementia in other diseases classified elsewhere, unspecified severity, with psychotic disturbance: Secondary | ICD-10-CM | POA: Diagnosis not present

## 2023-03-26 DIAGNOSIS — L723 Sebaceous cyst: Secondary | ICD-10-CM | POA: Diagnosis not present

## 2023-03-26 DIAGNOSIS — Z8781 Personal history of (healed) traumatic fracture: Secondary | ICD-10-CM | POA: Diagnosis not present

## 2023-03-26 DIAGNOSIS — G3183 Dementia with Lewy bodies: Secondary | ICD-10-CM | POA: Diagnosis not present

## 2023-03-26 DIAGNOSIS — F02811 Dementia in other diseases classified elsewhere, unspecified severity, with agitation: Secondary | ICD-10-CM | POA: Diagnosis not present

## 2023-03-26 DIAGNOSIS — Z9181 History of falling: Secondary | ICD-10-CM | POA: Diagnosis not present

## 2023-03-27 DIAGNOSIS — Z8781 Personal history of (healed) traumatic fracture: Secondary | ICD-10-CM | POA: Diagnosis not present

## 2023-03-27 DIAGNOSIS — F02811 Dementia in other diseases classified elsewhere, unspecified severity, with agitation: Secondary | ICD-10-CM | POA: Diagnosis not present

## 2023-03-27 DIAGNOSIS — L723 Sebaceous cyst: Secondary | ICD-10-CM | POA: Diagnosis not present

## 2023-03-27 DIAGNOSIS — G3183 Dementia with Lewy bodies: Secondary | ICD-10-CM | POA: Diagnosis not present

## 2023-03-27 DIAGNOSIS — F0282 Dementia in other diseases classified elsewhere, unspecified severity, with psychotic disturbance: Secondary | ICD-10-CM | POA: Diagnosis not present

## 2023-03-27 DIAGNOSIS — Z9181 History of falling: Secondary | ICD-10-CM | POA: Diagnosis not present

## 2023-03-30 DIAGNOSIS — G3183 Dementia with Lewy bodies: Secondary | ICD-10-CM | POA: Diagnosis not present

## 2023-03-30 DIAGNOSIS — F0282 Dementia in other diseases classified elsewhere, unspecified severity, with psychotic disturbance: Secondary | ICD-10-CM | POA: Diagnosis not present

## 2023-03-30 DIAGNOSIS — S61411A Laceration without foreign body of right hand, initial encounter: Secondary | ICD-10-CM | POA: Diagnosis not present

## 2023-03-30 DIAGNOSIS — Z8781 Personal history of (healed) traumatic fracture: Secondary | ICD-10-CM | POA: Diagnosis not present

## 2023-03-30 DIAGNOSIS — S0181XA Laceration without foreign body of other part of head, initial encounter: Secondary | ICD-10-CM | POA: Diagnosis not present

## 2023-03-30 DIAGNOSIS — R4182 Altered mental status, unspecified: Secondary | ICD-10-CM | POA: Diagnosis not present

## 2023-03-30 DIAGNOSIS — Z9181 History of falling: Secondary | ICD-10-CM | POA: Diagnosis not present

## 2023-03-30 DIAGNOSIS — F02811 Dementia in other diseases classified elsewhere, unspecified severity, with agitation: Secondary | ICD-10-CM | POA: Diagnosis not present

## 2023-03-30 DIAGNOSIS — L723 Sebaceous cyst: Secondary | ICD-10-CM | POA: Diagnosis not present

## 2023-03-31 DIAGNOSIS — M2550 Pain in unspecified joint: Secondary | ICD-10-CM | POA: Diagnosis not present

## 2023-03-31 DIAGNOSIS — Z7401 Bed confinement status: Secondary | ICD-10-CM | POA: Diagnosis not present

## 2023-04-02 DIAGNOSIS — Z8781 Personal history of (healed) traumatic fracture: Secondary | ICD-10-CM | POA: Diagnosis not present

## 2023-04-02 DIAGNOSIS — Z9181 History of falling: Secondary | ICD-10-CM | POA: Diagnosis not present

## 2023-04-02 DIAGNOSIS — G3183 Dementia with Lewy bodies: Secondary | ICD-10-CM | POA: Diagnosis not present

## 2023-04-02 DIAGNOSIS — F02811 Dementia in other diseases classified elsewhere, unspecified severity, with agitation: Secondary | ICD-10-CM | POA: Diagnosis not present

## 2023-04-02 DIAGNOSIS — F0282 Dementia in other diseases classified elsewhere, unspecified severity, with psychotic disturbance: Secondary | ICD-10-CM | POA: Diagnosis not present

## 2023-04-02 DIAGNOSIS — L723 Sebaceous cyst: Secondary | ICD-10-CM | POA: Diagnosis not present

## 2023-04-07 DIAGNOSIS — Z8781 Personal history of (healed) traumatic fracture: Secondary | ICD-10-CM | POA: Diagnosis not present

## 2023-04-07 DIAGNOSIS — L723 Sebaceous cyst: Secondary | ICD-10-CM | POA: Diagnosis not present

## 2023-04-07 DIAGNOSIS — Z9181 History of falling: Secondary | ICD-10-CM | POA: Diagnosis not present

## 2023-04-07 DIAGNOSIS — F02811 Dementia in other diseases classified elsewhere, unspecified severity, with agitation: Secondary | ICD-10-CM | POA: Diagnosis not present

## 2023-04-07 DIAGNOSIS — G3183 Dementia with Lewy bodies: Secondary | ICD-10-CM | POA: Diagnosis not present

## 2023-04-07 DIAGNOSIS — F0282 Dementia in other diseases classified elsewhere, unspecified severity, with psychotic disturbance: Secondary | ICD-10-CM | POA: Diagnosis not present

## 2023-04-09 DIAGNOSIS — Z8781 Personal history of (healed) traumatic fracture: Secondary | ICD-10-CM | POA: Diagnosis not present

## 2023-04-09 DIAGNOSIS — G3183 Dementia with Lewy bodies: Secondary | ICD-10-CM | POA: Diagnosis not present

## 2023-04-09 DIAGNOSIS — F02811 Dementia in other diseases classified elsewhere, unspecified severity, with agitation: Secondary | ICD-10-CM | POA: Diagnosis not present

## 2023-04-09 DIAGNOSIS — L723 Sebaceous cyst: Secondary | ICD-10-CM | POA: Diagnosis not present

## 2023-04-09 DIAGNOSIS — F0282 Dementia in other diseases classified elsewhere, unspecified severity, with psychotic disturbance: Secondary | ICD-10-CM | POA: Diagnosis not present

## 2023-04-09 DIAGNOSIS — Z9181 History of falling: Secondary | ICD-10-CM | POA: Diagnosis not present

## 2023-04-14 DIAGNOSIS — J302 Other seasonal allergic rhinitis: Secondary | ICD-10-CM | POA: Diagnosis not present

## 2023-04-14 DIAGNOSIS — F0282 Dementia in other diseases classified elsewhere, unspecified severity, with psychotic disturbance: Secondary | ICD-10-CM | POA: Diagnosis not present

## 2023-04-14 DIAGNOSIS — R1310 Dysphagia, unspecified: Secondary | ICD-10-CM | POA: Diagnosis not present

## 2023-04-14 DIAGNOSIS — F02811 Dementia in other diseases classified elsewhere, unspecified severity, with agitation: Secondary | ICD-10-CM | POA: Diagnosis not present

## 2023-04-14 DIAGNOSIS — L723 Sebaceous cyst: Secondary | ICD-10-CM | POA: Diagnosis not present

## 2023-04-14 DIAGNOSIS — Z8781 Personal history of (healed) traumatic fracture: Secondary | ICD-10-CM | POA: Diagnosis not present

## 2023-04-14 DIAGNOSIS — Z8701 Personal history of pneumonia (recurrent): Secondary | ICD-10-CM | POA: Diagnosis not present

## 2023-04-14 DIAGNOSIS — I10 Essential (primary) hypertension: Secondary | ICD-10-CM | POA: Diagnosis not present

## 2023-04-14 DIAGNOSIS — E440 Moderate protein-calorie malnutrition: Secondary | ICD-10-CM | POA: Diagnosis not present

## 2023-04-14 DIAGNOSIS — I252 Old myocardial infarction: Secondary | ICD-10-CM | POA: Diagnosis not present

## 2023-04-14 DIAGNOSIS — M109 Gout, unspecified: Secondary | ICD-10-CM | POA: Diagnosis not present

## 2023-04-14 DIAGNOSIS — G3183 Dementia with Lewy bodies: Secondary | ICD-10-CM | POA: Diagnosis not present

## 2023-04-14 DIAGNOSIS — Z96642 Presence of left artificial hip joint: Secondary | ICD-10-CM | POA: Diagnosis not present

## 2023-04-14 DIAGNOSIS — Z9181 History of falling: Secondary | ICD-10-CM | POA: Diagnosis not present

## 2023-04-14 DIAGNOSIS — Z96641 Presence of right artificial hip joint: Secondary | ICD-10-CM | POA: Diagnosis not present

## 2023-04-14 DIAGNOSIS — M1990 Unspecified osteoarthritis, unspecified site: Secondary | ICD-10-CM | POA: Diagnosis not present

## 2023-04-14 DIAGNOSIS — K219 Gastro-esophageal reflux disease without esophagitis: Secondary | ICD-10-CM | POA: Diagnosis not present

## 2023-04-14 DIAGNOSIS — G4733 Obstructive sleep apnea (adult) (pediatric): Secondary | ICD-10-CM | POA: Diagnosis not present

## 2023-04-14 DIAGNOSIS — N400 Enlarged prostate without lower urinary tract symptoms: Secondary | ICD-10-CM | POA: Diagnosis not present

## 2023-04-14 DIAGNOSIS — I480 Paroxysmal atrial fibrillation: Secondary | ICD-10-CM | POA: Diagnosis not present

## 2023-04-14 DIAGNOSIS — E119 Type 2 diabetes mellitus without complications: Secondary | ICD-10-CM | POA: Diagnosis not present

## 2023-04-14 DIAGNOSIS — I2510 Atherosclerotic heart disease of native coronary artery without angina pectoris: Secondary | ICD-10-CM | POA: Diagnosis not present

## 2023-04-14 DIAGNOSIS — K222 Esophageal obstruction: Secondary | ICD-10-CM | POA: Diagnosis not present

## 2023-04-16 DIAGNOSIS — G3183 Dementia with Lewy bodies: Secondary | ICD-10-CM | POA: Diagnosis not present

## 2023-04-16 DIAGNOSIS — F02811 Dementia in other diseases classified elsewhere, unspecified severity, with agitation: Secondary | ICD-10-CM | POA: Diagnosis not present

## 2023-04-16 DIAGNOSIS — L723 Sebaceous cyst: Secondary | ICD-10-CM | POA: Diagnosis not present

## 2023-04-16 DIAGNOSIS — F0282 Dementia in other diseases classified elsewhere, unspecified severity, with psychotic disturbance: Secondary | ICD-10-CM | POA: Diagnosis not present

## 2023-04-16 DIAGNOSIS — Z9181 History of falling: Secondary | ICD-10-CM | POA: Diagnosis not present

## 2023-04-16 DIAGNOSIS — Z8781 Personal history of (healed) traumatic fracture: Secondary | ICD-10-CM | POA: Diagnosis not present

## 2023-04-20 DIAGNOSIS — F0282 Dementia in other diseases classified elsewhere, unspecified severity, with psychotic disturbance: Secondary | ICD-10-CM | POA: Diagnosis not present

## 2023-04-20 DIAGNOSIS — Z9181 History of falling: Secondary | ICD-10-CM | POA: Diagnosis not present

## 2023-04-20 DIAGNOSIS — G3183 Dementia with Lewy bodies: Secondary | ICD-10-CM | POA: Diagnosis not present

## 2023-04-20 DIAGNOSIS — F02811 Dementia in other diseases classified elsewhere, unspecified severity, with agitation: Secondary | ICD-10-CM | POA: Diagnosis not present

## 2023-04-20 DIAGNOSIS — Z8781 Personal history of (healed) traumatic fracture: Secondary | ICD-10-CM | POA: Diagnosis not present

## 2023-04-20 DIAGNOSIS — L723 Sebaceous cyst: Secondary | ICD-10-CM | POA: Diagnosis not present

## 2023-04-23 DIAGNOSIS — L723 Sebaceous cyst: Secondary | ICD-10-CM | POA: Diagnosis not present

## 2023-04-23 DIAGNOSIS — Z8781 Personal history of (healed) traumatic fracture: Secondary | ICD-10-CM | POA: Diagnosis not present

## 2023-04-23 DIAGNOSIS — Z9181 History of falling: Secondary | ICD-10-CM | POA: Diagnosis not present

## 2023-04-23 DIAGNOSIS — F0282 Dementia in other diseases classified elsewhere, unspecified severity, with psychotic disturbance: Secondary | ICD-10-CM | POA: Diagnosis not present

## 2023-04-23 DIAGNOSIS — G3183 Dementia with Lewy bodies: Secondary | ICD-10-CM | POA: Diagnosis not present

## 2023-04-23 DIAGNOSIS — F02811 Dementia in other diseases classified elsewhere, unspecified severity, with agitation: Secondary | ICD-10-CM | POA: Diagnosis not present

## 2023-04-30 DIAGNOSIS — F0282 Dementia in other diseases classified elsewhere, unspecified severity, with psychotic disturbance: Secondary | ICD-10-CM | POA: Diagnosis not present

## 2023-04-30 DIAGNOSIS — Z8781 Personal history of (healed) traumatic fracture: Secondary | ICD-10-CM | POA: Diagnosis not present

## 2023-04-30 DIAGNOSIS — G3183 Dementia with Lewy bodies: Secondary | ICD-10-CM | POA: Diagnosis not present

## 2023-04-30 DIAGNOSIS — L723 Sebaceous cyst: Secondary | ICD-10-CM | POA: Diagnosis not present

## 2023-04-30 DIAGNOSIS — Z9181 History of falling: Secondary | ICD-10-CM | POA: Diagnosis not present

## 2023-04-30 DIAGNOSIS — F02811 Dementia in other diseases classified elsewhere, unspecified severity, with agitation: Secondary | ICD-10-CM | POA: Diagnosis not present

## 2023-05-02 DIAGNOSIS — Z8781 Personal history of (healed) traumatic fracture: Secondary | ICD-10-CM | POA: Diagnosis not present

## 2023-05-02 DIAGNOSIS — Z9181 History of falling: Secondary | ICD-10-CM | POA: Diagnosis not present

## 2023-05-02 DIAGNOSIS — F0282 Dementia in other diseases classified elsewhere, unspecified severity, with psychotic disturbance: Secondary | ICD-10-CM | POA: Diagnosis not present

## 2023-05-02 DIAGNOSIS — G3183 Dementia with Lewy bodies: Secondary | ICD-10-CM | POA: Diagnosis not present

## 2023-05-02 DIAGNOSIS — F02811 Dementia in other diseases classified elsewhere, unspecified severity, with agitation: Secondary | ICD-10-CM | POA: Diagnosis not present

## 2023-05-02 DIAGNOSIS — L723 Sebaceous cyst: Secondary | ICD-10-CM | POA: Diagnosis not present

## 2023-05-07 DIAGNOSIS — G3183 Dementia with Lewy bodies: Secondary | ICD-10-CM | POA: Diagnosis not present

## 2023-05-07 DIAGNOSIS — Z8781 Personal history of (healed) traumatic fracture: Secondary | ICD-10-CM | POA: Diagnosis not present

## 2023-05-07 DIAGNOSIS — L723 Sebaceous cyst: Secondary | ICD-10-CM | POA: Diagnosis not present

## 2023-05-07 DIAGNOSIS — F0282 Dementia in other diseases classified elsewhere, unspecified severity, with psychotic disturbance: Secondary | ICD-10-CM | POA: Diagnosis not present

## 2023-05-07 DIAGNOSIS — F02811 Dementia in other diseases classified elsewhere, unspecified severity, with agitation: Secondary | ICD-10-CM | POA: Diagnosis not present

## 2023-05-07 DIAGNOSIS — Z9181 History of falling: Secondary | ICD-10-CM | POA: Diagnosis not present

## 2023-05-14 DIAGNOSIS — F0282 Dementia in other diseases classified elsewhere, unspecified severity, with psychotic disturbance: Secondary | ICD-10-CM | POA: Diagnosis not present

## 2023-05-14 DIAGNOSIS — Z9181 History of falling: Secondary | ICD-10-CM | POA: Diagnosis not present

## 2023-05-14 DIAGNOSIS — L723 Sebaceous cyst: Secondary | ICD-10-CM | POA: Diagnosis not present

## 2023-05-14 DIAGNOSIS — F02811 Dementia in other diseases classified elsewhere, unspecified severity, with agitation: Secondary | ICD-10-CM | POA: Diagnosis not present

## 2023-05-14 DIAGNOSIS — Z8781 Personal history of (healed) traumatic fracture: Secondary | ICD-10-CM | POA: Diagnosis not present

## 2023-05-14 DIAGNOSIS — G3183 Dementia with Lewy bodies: Secondary | ICD-10-CM | POA: Diagnosis not present

## 2023-05-15 DIAGNOSIS — L723 Sebaceous cyst: Secondary | ICD-10-CM | POA: Diagnosis not present

## 2023-05-15 DIAGNOSIS — Z96642 Presence of left artificial hip joint: Secondary | ICD-10-CM | POA: Diagnosis not present

## 2023-05-15 DIAGNOSIS — R1310 Dysphagia, unspecified: Secondary | ICD-10-CM | POA: Diagnosis not present

## 2023-05-15 DIAGNOSIS — Z96641 Presence of right artificial hip joint: Secondary | ICD-10-CM | POA: Diagnosis not present

## 2023-05-15 DIAGNOSIS — E119 Type 2 diabetes mellitus without complications: Secondary | ICD-10-CM | POA: Diagnosis not present

## 2023-05-15 DIAGNOSIS — F0282 Dementia in other diseases classified elsewhere, unspecified severity, with psychotic disturbance: Secondary | ICD-10-CM | POA: Diagnosis not present

## 2023-05-15 DIAGNOSIS — G4733 Obstructive sleep apnea (adult) (pediatric): Secondary | ICD-10-CM | POA: Diagnosis not present

## 2023-05-15 DIAGNOSIS — K219 Gastro-esophageal reflux disease without esophagitis: Secondary | ICD-10-CM | POA: Diagnosis not present

## 2023-05-15 DIAGNOSIS — Z8701 Personal history of pneumonia (recurrent): Secondary | ICD-10-CM | POA: Diagnosis not present

## 2023-05-15 DIAGNOSIS — J302 Other seasonal allergic rhinitis: Secondary | ICD-10-CM | POA: Diagnosis not present

## 2023-05-15 DIAGNOSIS — M1990 Unspecified osteoarthritis, unspecified site: Secondary | ICD-10-CM | POA: Diagnosis not present

## 2023-05-15 DIAGNOSIS — F02811 Dementia in other diseases classified elsewhere, unspecified severity, with agitation: Secondary | ICD-10-CM | POA: Diagnosis not present

## 2023-05-15 DIAGNOSIS — I480 Paroxysmal atrial fibrillation: Secondary | ICD-10-CM | POA: Diagnosis not present

## 2023-05-15 DIAGNOSIS — N400 Enlarged prostate without lower urinary tract symptoms: Secondary | ICD-10-CM | POA: Diagnosis not present

## 2023-05-15 DIAGNOSIS — Z9181 History of falling: Secondary | ICD-10-CM | POA: Diagnosis not present

## 2023-05-15 DIAGNOSIS — Z8781 Personal history of (healed) traumatic fracture: Secondary | ICD-10-CM | POA: Diagnosis not present

## 2023-05-15 DIAGNOSIS — G3183 Dementia with Lewy bodies: Secondary | ICD-10-CM | POA: Diagnosis not present

## 2023-05-15 DIAGNOSIS — K222 Esophageal obstruction: Secondary | ICD-10-CM | POA: Diagnosis not present

## 2023-05-15 DIAGNOSIS — I2510 Atherosclerotic heart disease of native coronary artery without angina pectoris: Secondary | ICD-10-CM | POA: Diagnosis not present

## 2023-05-15 DIAGNOSIS — M109 Gout, unspecified: Secondary | ICD-10-CM | POA: Diagnosis not present

## 2023-05-15 DIAGNOSIS — E440 Moderate protein-calorie malnutrition: Secondary | ICD-10-CM | POA: Diagnosis not present

## 2023-05-15 DIAGNOSIS — I252 Old myocardial infarction: Secondary | ICD-10-CM | POA: Diagnosis not present

## 2023-05-15 DIAGNOSIS — I10 Essential (primary) hypertension: Secondary | ICD-10-CM | POA: Diagnosis not present

## 2023-05-21 DIAGNOSIS — F02811 Dementia in other diseases classified elsewhere, unspecified severity, with agitation: Secondary | ICD-10-CM | POA: Diagnosis not present

## 2023-05-21 DIAGNOSIS — L723 Sebaceous cyst: Secondary | ICD-10-CM | POA: Diagnosis not present

## 2023-05-21 DIAGNOSIS — F0282 Dementia in other diseases classified elsewhere, unspecified severity, with psychotic disturbance: Secondary | ICD-10-CM | POA: Diagnosis not present

## 2023-05-21 DIAGNOSIS — Z8781 Personal history of (healed) traumatic fracture: Secondary | ICD-10-CM | POA: Diagnosis not present

## 2023-05-21 DIAGNOSIS — Z9181 History of falling: Secondary | ICD-10-CM | POA: Diagnosis not present

## 2023-05-21 DIAGNOSIS — G3183 Dementia with Lewy bodies: Secondary | ICD-10-CM | POA: Diagnosis not present

## 2023-05-26 DIAGNOSIS — F02811 Dementia in other diseases classified elsewhere, unspecified severity, with agitation: Secondary | ICD-10-CM | POA: Diagnosis not present

## 2023-05-26 DIAGNOSIS — F0282 Dementia in other diseases classified elsewhere, unspecified severity, with psychotic disturbance: Secondary | ICD-10-CM | POA: Diagnosis not present

## 2023-05-26 DIAGNOSIS — Z8781 Personal history of (healed) traumatic fracture: Secondary | ICD-10-CM | POA: Diagnosis not present

## 2023-05-26 DIAGNOSIS — G3183 Dementia with Lewy bodies: Secondary | ICD-10-CM | POA: Diagnosis not present

## 2023-05-26 DIAGNOSIS — Z9181 History of falling: Secondary | ICD-10-CM | POA: Diagnosis not present

## 2023-05-26 DIAGNOSIS — L723 Sebaceous cyst: Secondary | ICD-10-CM | POA: Diagnosis not present

## 2023-05-28 DIAGNOSIS — Z9181 History of falling: Secondary | ICD-10-CM | POA: Diagnosis not present

## 2023-05-28 DIAGNOSIS — Z8781 Personal history of (healed) traumatic fracture: Secondary | ICD-10-CM | POA: Diagnosis not present

## 2023-05-28 DIAGNOSIS — G3183 Dementia with Lewy bodies: Secondary | ICD-10-CM | POA: Diagnosis not present

## 2023-05-28 DIAGNOSIS — F0282 Dementia in other diseases classified elsewhere, unspecified severity, with psychotic disturbance: Secondary | ICD-10-CM | POA: Diagnosis not present

## 2023-05-28 DIAGNOSIS — L723 Sebaceous cyst: Secondary | ICD-10-CM | POA: Diagnosis not present

## 2023-05-28 DIAGNOSIS — F02811 Dementia in other diseases classified elsewhere, unspecified severity, with agitation: Secondary | ICD-10-CM | POA: Diagnosis not present

## 2023-05-30 DIAGNOSIS — G3183 Dementia with Lewy bodies: Secondary | ICD-10-CM | POA: Diagnosis not present

## 2023-05-30 DIAGNOSIS — Z8781 Personal history of (healed) traumatic fracture: Secondary | ICD-10-CM | POA: Diagnosis not present

## 2023-05-30 DIAGNOSIS — L723 Sebaceous cyst: Secondary | ICD-10-CM | POA: Diagnosis not present

## 2023-05-30 DIAGNOSIS — F0282 Dementia in other diseases classified elsewhere, unspecified severity, with psychotic disturbance: Secondary | ICD-10-CM | POA: Diagnosis not present

## 2023-05-30 DIAGNOSIS — F02811 Dementia in other diseases classified elsewhere, unspecified severity, with agitation: Secondary | ICD-10-CM | POA: Diagnosis not present

## 2023-05-30 DIAGNOSIS — Z9181 History of falling: Secondary | ICD-10-CM | POA: Diagnosis not present

## 2023-06-04 DIAGNOSIS — L723 Sebaceous cyst: Secondary | ICD-10-CM | POA: Diagnosis not present

## 2023-06-04 DIAGNOSIS — F0282 Dementia in other diseases classified elsewhere, unspecified severity, with psychotic disturbance: Secondary | ICD-10-CM | POA: Diagnosis not present

## 2023-06-04 DIAGNOSIS — Z8781 Personal history of (healed) traumatic fracture: Secondary | ICD-10-CM | POA: Diagnosis not present

## 2023-06-04 DIAGNOSIS — Z9181 History of falling: Secondary | ICD-10-CM | POA: Diagnosis not present

## 2023-06-04 DIAGNOSIS — G3183 Dementia with Lewy bodies: Secondary | ICD-10-CM | POA: Diagnosis not present

## 2023-06-04 DIAGNOSIS — F02811 Dementia in other diseases classified elsewhere, unspecified severity, with agitation: Secondary | ICD-10-CM | POA: Diagnosis not present

## 2023-06-11 DIAGNOSIS — Z8781 Personal history of (healed) traumatic fracture: Secondary | ICD-10-CM | POA: Diagnosis not present

## 2023-06-11 DIAGNOSIS — G3183 Dementia with Lewy bodies: Secondary | ICD-10-CM | POA: Diagnosis not present

## 2023-06-11 DIAGNOSIS — Z9181 History of falling: Secondary | ICD-10-CM | POA: Diagnosis not present

## 2023-06-11 DIAGNOSIS — L723 Sebaceous cyst: Secondary | ICD-10-CM | POA: Diagnosis not present

## 2023-06-11 DIAGNOSIS — F0282 Dementia in other diseases classified elsewhere, unspecified severity, with psychotic disturbance: Secondary | ICD-10-CM | POA: Diagnosis not present

## 2023-06-11 DIAGNOSIS — F02811 Dementia in other diseases classified elsewhere, unspecified severity, with agitation: Secondary | ICD-10-CM | POA: Diagnosis not present

## 2023-06-15 DIAGNOSIS — Z96641 Presence of right artificial hip joint: Secondary | ICD-10-CM | POA: Diagnosis not present

## 2023-06-15 DIAGNOSIS — Z8701 Personal history of pneumonia (recurrent): Secondary | ICD-10-CM | POA: Diagnosis not present

## 2023-06-15 DIAGNOSIS — G4733 Obstructive sleep apnea (adult) (pediatric): Secondary | ICD-10-CM | POA: Diagnosis not present

## 2023-06-15 DIAGNOSIS — J302 Other seasonal allergic rhinitis: Secondary | ICD-10-CM | POA: Diagnosis not present

## 2023-06-15 DIAGNOSIS — K219 Gastro-esophageal reflux disease without esophagitis: Secondary | ICD-10-CM | POA: Diagnosis not present

## 2023-06-15 DIAGNOSIS — I480 Paroxysmal atrial fibrillation: Secondary | ICD-10-CM | POA: Diagnosis not present

## 2023-06-15 DIAGNOSIS — L723 Sebaceous cyst: Secondary | ICD-10-CM | POA: Diagnosis not present

## 2023-06-15 DIAGNOSIS — Z96642 Presence of left artificial hip joint: Secondary | ICD-10-CM | POA: Diagnosis not present

## 2023-06-15 DIAGNOSIS — I2510 Atherosclerotic heart disease of native coronary artery without angina pectoris: Secondary | ICD-10-CM | POA: Diagnosis not present

## 2023-06-15 DIAGNOSIS — K222 Esophageal obstruction: Secondary | ICD-10-CM | POA: Diagnosis not present

## 2023-06-15 DIAGNOSIS — E119 Type 2 diabetes mellitus without complications: Secondary | ICD-10-CM | POA: Diagnosis not present

## 2023-06-15 DIAGNOSIS — F02811 Dementia in other diseases classified elsewhere, unspecified severity, with agitation: Secondary | ICD-10-CM | POA: Diagnosis not present

## 2023-06-15 DIAGNOSIS — F0282 Dementia in other diseases classified elsewhere, unspecified severity, with psychotic disturbance: Secondary | ICD-10-CM | POA: Diagnosis not present

## 2023-06-15 DIAGNOSIS — Z9181 History of falling: Secondary | ICD-10-CM | POA: Diagnosis not present

## 2023-06-15 DIAGNOSIS — M1990 Unspecified osteoarthritis, unspecified site: Secondary | ICD-10-CM | POA: Diagnosis not present

## 2023-06-15 DIAGNOSIS — N400 Enlarged prostate without lower urinary tract symptoms: Secondary | ICD-10-CM | POA: Diagnosis not present

## 2023-06-15 DIAGNOSIS — G3183 Dementia with Lewy bodies: Secondary | ICD-10-CM | POA: Diagnosis not present

## 2023-06-15 DIAGNOSIS — M109 Gout, unspecified: Secondary | ICD-10-CM | POA: Diagnosis not present

## 2023-06-15 DIAGNOSIS — R1310 Dysphagia, unspecified: Secondary | ICD-10-CM | POA: Diagnosis not present

## 2023-06-15 DIAGNOSIS — I10 Essential (primary) hypertension: Secondary | ICD-10-CM | POA: Diagnosis not present

## 2023-06-15 DIAGNOSIS — E440 Moderate protein-calorie malnutrition: Secondary | ICD-10-CM | POA: Diagnosis not present

## 2023-06-15 DIAGNOSIS — I252 Old myocardial infarction: Secondary | ICD-10-CM | POA: Diagnosis not present

## 2023-06-15 DIAGNOSIS — Z8781 Personal history of (healed) traumatic fracture: Secondary | ICD-10-CM | POA: Diagnosis not present

## 2023-06-18 DIAGNOSIS — Z9181 History of falling: Secondary | ICD-10-CM | POA: Diagnosis not present

## 2023-06-18 DIAGNOSIS — F0282 Dementia in other diseases classified elsewhere, unspecified severity, with psychotic disturbance: Secondary | ICD-10-CM | POA: Diagnosis not present

## 2023-06-18 DIAGNOSIS — F02811 Dementia in other diseases classified elsewhere, unspecified severity, with agitation: Secondary | ICD-10-CM | POA: Diagnosis not present

## 2023-06-18 DIAGNOSIS — Z8781 Personal history of (healed) traumatic fracture: Secondary | ICD-10-CM | POA: Diagnosis not present

## 2023-06-18 DIAGNOSIS — L723 Sebaceous cyst: Secondary | ICD-10-CM | POA: Diagnosis not present

## 2023-06-18 DIAGNOSIS — G3183 Dementia with Lewy bodies: Secondary | ICD-10-CM | POA: Diagnosis not present

## 2023-06-21 DIAGNOSIS — L723 Sebaceous cyst: Secondary | ICD-10-CM | POA: Diagnosis not present

## 2023-06-21 DIAGNOSIS — Z8781 Personal history of (healed) traumatic fracture: Secondary | ICD-10-CM | POA: Diagnosis not present

## 2023-06-21 DIAGNOSIS — F0282 Dementia in other diseases classified elsewhere, unspecified severity, with psychotic disturbance: Secondary | ICD-10-CM | POA: Diagnosis not present

## 2023-06-21 DIAGNOSIS — G3183 Dementia with Lewy bodies: Secondary | ICD-10-CM | POA: Diagnosis not present

## 2023-06-21 DIAGNOSIS — F02811 Dementia in other diseases classified elsewhere, unspecified severity, with agitation: Secondary | ICD-10-CM | POA: Diagnosis not present

## 2023-06-21 DIAGNOSIS — Z9181 History of falling: Secondary | ICD-10-CM | POA: Diagnosis not present

## 2023-06-25 DIAGNOSIS — Z9181 History of falling: Secondary | ICD-10-CM | POA: Diagnosis not present

## 2023-06-25 DIAGNOSIS — G3183 Dementia with Lewy bodies: Secondary | ICD-10-CM | POA: Diagnosis not present

## 2023-06-25 DIAGNOSIS — F0282 Dementia in other diseases classified elsewhere, unspecified severity, with psychotic disturbance: Secondary | ICD-10-CM | POA: Diagnosis not present

## 2023-06-25 DIAGNOSIS — F02811 Dementia in other diseases classified elsewhere, unspecified severity, with agitation: Secondary | ICD-10-CM | POA: Diagnosis not present

## 2023-06-25 DIAGNOSIS — Z8781 Personal history of (healed) traumatic fracture: Secondary | ICD-10-CM | POA: Diagnosis not present

## 2023-06-25 DIAGNOSIS — L723 Sebaceous cyst: Secondary | ICD-10-CM | POA: Diagnosis not present

## 2023-07-02 DIAGNOSIS — F02811 Dementia in other diseases classified elsewhere, unspecified severity, with agitation: Secondary | ICD-10-CM | POA: Diagnosis not present

## 2023-07-02 DIAGNOSIS — F0282 Dementia in other diseases classified elsewhere, unspecified severity, with psychotic disturbance: Secondary | ICD-10-CM | POA: Diagnosis not present

## 2023-07-02 DIAGNOSIS — Z9181 History of falling: Secondary | ICD-10-CM | POA: Diagnosis not present

## 2023-07-02 DIAGNOSIS — L723 Sebaceous cyst: Secondary | ICD-10-CM | POA: Diagnosis not present

## 2023-07-02 DIAGNOSIS — Z8781 Personal history of (healed) traumatic fracture: Secondary | ICD-10-CM | POA: Diagnosis not present

## 2023-07-02 DIAGNOSIS — G3183 Dementia with Lewy bodies: Secondary | ICD-10-CM | POA: Diagnosis not present

## 2023-07-03 DIAGNOSIS — F0282 Dementia in other diseases classified elsewhere, unspecified severity, with psychotic disturbance: Secondary | ICD-10-CM | POA: Diagnosis not present

## 2023-07-03 DIAGNOSIS — G3183 Dementia with Lewy bodies: Secondary | ICD-10-CM | POA: Diagnosis not present

## 2023-07-03 DIAGNOSIS — Z8781 Personal history of (healed) traumatic fracture: Secondary | ICD-10-CM | POA: Diagnosis not present

## 2023-07-03 DIAGNOSIS — Z9181 History of falling: Secondary | ICD-10-CM | POA: Diagnosis not present

## 2023-07-03 DIAGNOSIS — F02811 Dementia in other diseases classified elsewhere, unspecified severity, with agitation: Secondary | ICD-10-CM | POA: Diagnosis not present

## 2023-07-03 DIAGNOSIS — L723 Sebaceous cyst: Secondary | ICD-10-CM | POA: Diagnosis not present

## 2023-07-09 DIAGNOSIS — G3183 Dementia with Lewy bodies: Secondary | ICD-10-CM | POA: Diagnosis not present

## 2023-07-09 DIAGNOSIS — Z9181 History of falling: Secondary | ICD-10-CM | POA: Diagnosis not present

## 2023-07-09 DIAGNOSIS — L723 Sebaceous cyst: Secondary | ICD-10-CM | POA: Diagnosis not present

## 2023-07-09 DIAGNOSIS — Z8781 Personal history of (healed) traumatic fracture: Secondary | ICD-10-CM | POA: Diagnosis not present

## 2023-07-09 DIAGNOSIS — F02811 Dementia in other diseases classified elsewhere, unspecified severity, with agitation: Secondary | ICD-10-CM | POA: Diagnosis not present

## 2023-07-09 DIAGNOSIS — F0282 Dementia in other diseases classified elsewhere, unspecified severity, with psychotic disturbance: Secondary | ICD-10-CM | POA: Diagnosis not present

## 2023-07-15 DIAGNOSIS — Z9181 History of falling: Secondary | ICD-10-CM | POA: Diagnosis not present

## 2023-07-15 DIAGNOSIS — Z8781 Personal history of (healed) traumatic fracture: Secondary | ICD-10-CM | POA: Diagnosis not present

## 2023-07-15 DIAGNOSIS — E440 Moderate protein-calorie malnutrition: Secondary | ICD-10-CM | POA: Diagnosis not present

## 2023-07-15 DIAGNOSIS — G4733 Obstructive sleep apnea (adult) (pediatric): Secondary | ICD-10-CM | POA: Diagnosis not present

## 2023-07-15 DIAGNOSIS — L723 Sebaceous cyst: Secondary | ICD-10-CM | POA: Diagnosis not present

## 2023-07-15 DIAGNOSIS — N400 Enlarged prostate without lower urinary tract symptoms: Secondary | ICD-10-CM | POA: Diagnosis not present

## 2023-07-15 DIAGNOSIS — K219 Gastro-esophageal reflux disease without esophagitis: Secondary | ICD-10-CM | POA: Diagnosis not present

## 2023-07-15 DIAGNOSIS — I2510 Atherosclerotic heart disease of native coronary artery without angina pectoris: Secondary | ICD-10-CM | POA: Diagnosis not present

## 2023-07-15 DIAGNOSIS — K222 Esophageal obstruction: Secondary | ICD-10-CM | POA: Diagnosis not present

## 2023-07-15 DIAGNOSIS — E119 Type 2 diabetes mellitus without complications: Secondary | ICD-10-CM | POA: Diagnosis not present

## 2023-07-15 DIAGNOSIS — Z96642 Presence of left artificial hip joint: Secondary | ICD-10-CM | POA: Diagnosis not present

## 2023-07-15 DIAGNOSIS — Z8701 Personal history of pneumonia (recurrent): Secondary | ICD-10-CM | POA: Diagnosis not present

## 2023-07-15 DIAGNOSIS — I480 Paroxysmal atrial fibrillation: Secondary | ICD-10-CM | POA: Diagnosis not present

## 2023-07-15 DIAGNOSIS — M109 Gout, unspecified: Secondary | ICD-10-CM | POA: Diagnosis not present

## 2023-07-15 DIAGNOSIS — F02811 Dementia in other diseases classified elsewhere, unspecified severity, with agitation: Secondary | ICD-10-CM | POA: Diagnosis not present

## 2023-07-15 DIAGNOSIS — R1310 Dysphagia, unspecified: Secondary | ICD-10-CM | POA: Diagnosis not present

## 2023-07-15 DIAGNOSIS — M1990 Unspecified osteoarthritis, unspecified site: Secondary | ICD-10-CM | POA: Diagnosis not present

## 2023-07-15 DIAGNOSIS — Z96641 Presence of right artificial hip joint: Secondary | ICD-10-CM | POA: Diagnosis not present

## 2023-07-15 DIAGNOSIS — G3183 Dementia with Lewy bodies: Secondary | ICD-10-CM | POA: Diagnosis not present

## 2023-07-15 DIAGNOSIS — I10 Essential (primary) hypertension: Secondary | ICD-10-CM | POA: Diagnosis not present

## 2023-07-15 DIAGNOSIS — F0282 Dementia in other diseases classified elsewhere, unspecified severity, with psychotic disturbance: Secondary | ICD-10-CM | POA: Diagnosis not present

## 2023-07-15 DIAGNOSIS — J302 Other seasonal allergic rhinitis: Secondary | ICD-10-CM | POA: Diagnosis not present

## 2023-07-15 DIAGNOSIS — I252 Old myocardial infarction: Secondary | ICD-10-CM | POA: Diagnosis not present

## 2023-07-16 DIAGNOSIS — L723 Sebaceous cyst: Secondary | ICD-10-CM | POA: Diagnosis not present

## 2023-07-16 DIAGNOSIS — Z8781 Personal history of (healed) traumatic fracture: Secondary | ICD-10-CM | POA: Diagnosis not present

## 2023-07-16 DIAGNOSIS — F02811 Dementia in other diseases classified elsewhere, unspecified severity, with agitation: Secondary | ICD-10-CM | POA: Diagnosis not present

## 2023-07-16 DIAGNOSIS — Z9181 History of falling: Secondary | ICD-10-CM | POA: Diagnosis not present

## 2023-07-16 DIAGNOSIS — G3183 Dementia with Lewy bodies: Secondary | ICD-10-CM | POA: Diagnosis not present

## 2023-07-16 DIAGNOSIS — F0282 Dementia in other diseases classified elsewhere, unspecified severity, with psychotic disturbance: Secondary | ICD-10-CM | POA: Diagnosis not present

## 2023-07-18 DIAGNOSIS — Z9181 History of falling: Secondary | ICD-10-CM | POA: Diagnosis not present

## 2023-07-18 DIAGNOSIS — G3183 Dementia with Lewy bodies: Secondary | ICD-10-CM | POA: Diagnosis not present

## 2023-07-18 DIAGNOSIS — Z8781 Personal history of (healed) traumatic fracture: Secondary | ICD-10-CM | POA: Diagnosis not present

## 2023-07-18 DIAGNOSIS — L723 Sebaceous cyst: Secondary | ICD-10-CM | POA: Diagnosis not present

## 2023-07-18 DIAGNOSIS — F02811 Dementia in other diseases classified elsewhere, unspecified severity, with agitation: Secondary | ICD-10-CM | POA: Diagnosis not present

## 2023-07-18 DIAGNOSIS — F0282 Dementia in other diseases classified elsewhere, unspecified severity, with psychotic disturbance: Secondary | ICD-10-CM | POA: Diagnosis not present

## 2023-07-23 DIAGNOSIS — F0282 Dementia in other diseases classified elsewhere, unspecified severity, with psychotic disturbance: Secondary | ICD-10-CM | POA: Diagnosis not present

## 2023-07-23 DIAGNOSIS — F02811 Dementia in other diseases classified elsewhere, unspecified severity, with agitation: Secondary | ICD-10-CM | POA: Diagnosis not present

## 2023-07-23 DIAGNOSIS — G3183 Dementia with Lewy bodies: Secondary | ICD-10-CM | POA: Diagnosis not present

## 2023-07-23 DIAGNOSIS — Z9181 History of falling: Secondary | ICD-10-CM | POA: Diagnosis not present

## 2023-07-23 DIAGNOSIS — Z8781 Personal history of (healed) traumatic fracture: Secondary | ICD-10-CM | POA: Diagnosis not present

## 2023-07-23 DIAGNOSIS — L723 Sebaceous cyst: Secondary | ICD-10-CM | POA: Diagnosis not present

## 2023-07-30 DIAGNOSIS — L723 Sebaceous cyst: Secondary | ICD-10-CM | POA: Diagnosis not present

## 2023-07-30 DIAGNOSIS — G3183 Dementia with Lewy bodies: Secondary | ICD-10-CM | POA: Diagnosis not present

## 2023-07-30 DIAGNOSIS — Z9181 History of falling: Secondary | ICD-10-CM | POA: Diagnosis not present

## 2023-07-30 DIAGNOSIS — F0282 Dementia in other diseases classified elsewhere, unspecified severity, with psychotic disturbance: Secondary | ICD-10-CM | POA: Diagnosis not present

## 2023-07-30 DIAGNOSIS — Z8781 Personal history of (healed) traumatic fracture: Secondary | ICD-10-CM | POA: Diagnosis not present

## 2023-07-30 DIAGNOSIS — F02811 Dementia in other diseases classified elsewhere, unspecified severity, with agitation: Secondary | ICD-10-CM | POA: Diagnosis not present

## 2023-08-04 DIAGNOSIS — F02811 Dementia in other diseases classified elsewhere, unspecified severity, with agitation: Secondary | ICD-10-CM | POA: Diagnosis not present

## 2023-08-04 DIAGNOSIS — G3183 Dementia with Lewy bodies: Secondary | ICD-10-CM | POA: Diagnosis not present

## 2023-08-04 DIAGNOSIS — Z8781 Personal history of (healed) traumatic fracture: Secondary | ICD-10-CM | POA: Diagnosis not present

## 2023-08-04 DIAGNOSIS — Z9181 History of falling: Secondary | ICD-10-CM | POA: Diagnosis not present

## 2023-08-04 DIAGNOSIS — L723 Sebaceous cyst: Secondary | ICD-10-CM | POA: Diagnosis not present

## 2023-08-04 DIAGNOSIS — F0282 Dementia in other diseases classified elsewhere, unspecified severity, with psychotic disturbance: Secondary | ICD-10-CM | POA: Diagnosis not present

## 2023-08-06 DIAGNOSIS — F02811 Dementia in other diseases classified elsewhere, unspecified severity, with agitation: Secondary | ICD-10-CM | POA: Diagnosis not present

## 2023-08-06 DIAGNOSIS — F0282 Dementia in other diseases classified elsewhere, unspecified severity, with psychotic disturbance: Secondary | ICD-10-CM | POA: Diagnosis not present

## 2023-08-06 DIAGNOSIS — G3183 Dementia with Lewy bodies: Secondary | ICD-10-CM | POA: Diagnosis not present

## 2023-08-06 DIAGNOSIS — Z9181 History of falling: Secondary | ICD-10-CM | POA: Diagnosis not present

## 2023-08-06 DIAGNOSIS — L723 Sebaceous cyst: Secondary | ICD-10-CM | POA: Diagnosis not present

## 2023-08-06 DIAGNOSIS — Z8781 Personal history of (healed) traumatic fracture: Secondary | ICD-10-CM | POA: Diagnosis not present

## 2023-08-11 DIAGNOSIS — L723 Sebaceous cyst: Secondary | ICD-10-CM | POA: Diagnosis not present

## 2023-08-11 DIAGNOSIS — F0282 Dementia in other diseases classified elsewhere, unspecified severity, with psychotic disturbance: Secondary | ICD-10-CM | POA: Diagnosis not present

## 2023-08-11 DIAGNOSIS — Z8781 Personal history of (healed) traumatic fracture: Secondary | ICD-10-CM | POA: Diagnosis not present

## 2023-08-11 DIAGNOSIS — Z9181 History of falling: Secondary | ICD-10-CM | POA: Diagnosis not present

## 2023-08-11 DIAGNOSIS — F02811 Dementia in other diseases classified elsewhere, unspecified severity, with agitation: Secondary | ICD-10-CM | POA: Diagnosis not present

## 2023-08-11 DIAGNOSIS — G3183 Dementia with Lewy bodies: Secondary | ICD-10-CM | POA: Diagnosis not present

## 2023-08-12 DIAGNOSIS — F0282 Dementia in other diseases classified elsewhere, unspecified severity, with psychotic disturbance: Secondary | ICD-10-CM | POA: Diagnosis not present

## 2023-08-12 DIAGNOSIS — Z8781 Personal history of (healed) traumatic fracture: Secondary | ICD-10-CM | POA: Diagnosis not present

## 2023-08-12 DIAGNOSIS — L723 Sebaceous cyst: Secondary | ICD-10-CM | POA: Diagnosis not present

## 2023-08-12 DIAGNOSIS — Z9181 History of falling: Secondary | ICD-10-CM | POA: Diagnosis not present

## 2023-08-12 DIAGNOSIS — F02811 Dementia in other diseases classified elsewhere, unspecified severity, with agitation: Secondary | ICD-10-CM | POA: Diagnosis not present

## 2023-08-12 DIAGNOSIS — G3183 Dementia with Lewy bodies: Secondary | ICD-10-CM | POA: Diagnosis not present

## 2023-08-13 DIAGNOSIS — F0282 Dementia in other diseases classified elsewhere, unspecified severity, with psychotic disturbance: Secondary | ICD-10-CM | POA: Diagnosis not present

## 2023-08-13 DIAGNOSIS — L723 Sebaceous cyst: Secondary | ICD-10-CM | POA: Diagnosis not present

## 2023-08-13 DIAGNOSIS — Z9181 History of falling: Secondary | ICD-10-CM | POA: Diagnosis not present

## 2023-08-13 DIAGNOSIS — G3183 Dementia with Lewy bodies: Secondary | ICD-10-CM | POA: Diagnosis not present

## 2023-08-13 DIAGNOSIS — F02811 Dementia in other diseases classified elsewhere, unspecified severity, with agitation: Secondary | ICD-10-CM | POA: Diagnosis not present

## 2023-08-13 DIAGNOSIS — Z8781 Personal history of (healed) traumatic fracture: Secondary | ICD-10-CM | POA: Diagnosis not present

## 2023-08-15 DIAGNOSIS — F0282 Dementia in other diseases classified elsewhere, unspecified severity, with psychotic disturbance: Secondary | ICD-10-CM | POA: Diagnosis not present

## 2023-08-15 DIAGNOSIS — Z96641 Presence of right artificial hip joint: Secondary | ICD-10-CM | POA: Diagnosis not present

## 2023-08-15 DIAGNOSIS — E119 Type 2 diabetes mellitus without complications: Secondary | ICD-10-CM | POA: Diagnosis not present

## 2023-08-15 DIAGNOSIS — F02811 Dementia in other diseases classified elsewhere, unspecified severity, with agitation: Secondary | ICD-10-CM | POA: Diagnosis not present

## 2023-08-15 DIAGNOSIS — Z8781 Personal history of (healed) traumatic fracture: Secondary | ICD-10-CM | POA: Diagnosis not present

## 2023-08-15 DIAGNOSIS — Z96642 Presence of left artificial hip joint: Secondary | ICD-10-CM | POA: Diagnosis not present

## 2023-08-15 DIAGNOSIS — Z8701 Personal history of pneumonia (recurrent): Secondary | ICD-10-CM | POA: Diagnosis not present

## 2023-08-15 DIAGNOSIS — R1310 Dysphagia, unspecified: Secondary | ICD-10-CM | POA: Diagnosis not present

## 2023-08-15 DIAGNOSIS — G3183 Dementia with Lewy bodies: Secondary | ICD-10-CM | POA: Diagnosis not present

## 2023-08-15 DIAGNOSIS — K219 Gastro-esophageal reflux disease without esophagitis: Secondary | ICD-10-CM | POA: Diagnosis not present

## 2023-08-15 DIAGNOSIS — K222 Esophageal obstruction: Secondary | ICD-10-CM | POA: Diagnosis not present

## 2023-08-15 DIAGNOSIS — N400 Enlarged prostate without lower urinary tract symptoms: Secondary | ICD-10-CM | POA: Diagnosis not present

## 2023-08-15 DIAGNOSIS — I2510 Atherosclerotic heart disease of native coronary artery without angina pectoris: Secondary | ICD-10-CM | POA: Diagnosis not present

## 2023-08-15 DIAGNOSIS — I480 Paroxysmal atrial fibrillation: Secondary | ICD-10-CM | POA: Diagnosis not present

## 2023-08-15 DIAGNOSIS — Z9181 History of falling: Secondary | ICD-10-CM | POA: Diagnosis not present

## 2023-08-15 DIAGNOSIS — L723 Sebaceous cyst: Secondary | ICD-10-CM | POA: Diagnosis not present

## 2023-08-15 DIAGNOSIS — I252 Old myocardial infarction: Secondary | ICD-10-CM | POA: Diagnosis not present

## 2023-08-15 DIAGNOSIS — E440 Moderate protein-calorie malnutrition: Secondary | ICD-10-CM | POA: Diagnosis not present

## 2023-08-15 DIAGNOSIS — G4733 Obstructive sleep apnea (adult) (pediatric): Secondary | ICD-10-CM | POA: Diagnosis not present

## 2023-08-15 DIAGNOSIS — I10 Essential (primary) hypertension: Secondary | ICD-10-CM | POA: Diagnosis not present

## 2023-08-15 DIAGNOSIS — J302 Other seasonal allergic rhinitis: Secondary | ICD-10-CM | POA: Diagnosis not present

## 2023-08-15 DIAGNOSIS — M1990 Unspecified osteoarthritis, unspecified site: Secondary | ICD-10-CM | POA: Diagnosis not present

## 2023-08-15 DIAGNOSIS — M109 Gout, unspecified: Secondary | ICD-10-CM | POA: Diagnosis not present

## 2023-08-19 DIAGNOSIS — L723 Sebaceous cyst: Secondary | ICD-10-CM | POA: Diagnosis not present

## 2023-08-19 DIAGNOSIS — Z8781 Personal history of (healed) traumatic fracture: Secondary | ICD-10-CM | POA: Diagnosis not present

## 2023-08-19 DIAGNOSIS — G3183 Dementia with Lewy bodies: Secondary | ICD-10-CM | POA: Diagnosis not present

## 2023-08-19 DIAGNOSIS — F0282 Dementia in other diseases classified elsewhere, unspecified severity, with psychotic disturbance: Secondary | ICD-10-CM | POA: Diagnosis not present

## 2023-08-19 DIAGNOSIS — Z9181 History of falling: Secondary | ICD-10-CM | POA: Diagnosis not present

## 2023-08-19 DIAGNOSIS — F02811 Dementia in other diseases classified elsewhere, unspecified severity, with agitation: Secondary | ICD-10-CM | POA: Diagnosis not present

## 2023-08-20 DIAGNOSIS — Z8781 Personal history of (healed) traumatic fracture: Secondary | ICD-10-CM | POA: Diagnosis not present

## 2023-08-20 DIAGNOSIS — Z9181 History of falling: Secondary | ICD-10-CM | POA: Diagnosis not present

## 2023-08-20 DIAGNOSIS — F02811 Dementia in other diseases classified elsewhere, unspecified severity, with agitation: Secondary | ICD-10-CM | POA: Diagnosis not present

## 2023-08-20 DIAGNOSIS — F0282 Dementia in other diseases classified elsewhere, unspecified severity, with psychotic disturbance: Secondary | ICD-10-CM | POA: Diagnosis not present

## 2023-08-20 DIAGNOSIS — G3183 Dementia with Lewy bodies: Secondary | ICD-10-CM | POA: Diagnosis not present

## 2023-08-20 DIAGNOSIS — L723 Sebaceous cyst: Secondary | ICD-10-CM | POA: Diagnosis not present

## 2023-09-14 DEATH — deceased
